# Patient Record
Sex: Male | Born: 1960 | Race: Black or African American | Hispanic: No | Smoking: Current every day smoker
Health system: Southern US, Community
[De-identification: ages and names within clinical notes are randomized; demographics above are authoritative.]

## PROBLEM LIST (undated history)

## (undated) DIAGNOSIS — E785 Hyperlipidemia, unspecified: Secondary | ICD-10-CM

## (undated) DIAGNOSIS — I2510 Atherosclerotic heart disease of native coronary artery without angina pectoris: Secondary | ICD-10-CM

## (undated) DIAGNOSIS — K219 Gastro-esophageal reflux disease without esophagitis: Secondary | ICD-10-CM

## (undated) DIAGNOSIS — I219 Acute myocardial infarction, unspecified: Secondary | ICD-10-CM

## (undated) DIAGNOSIS — K089 Disorder of teeth and supporting structures, unspecified: Secondary | ICD-10-CM

## (undated) DIAGNOSIS — J189 Pneumonia, unspecified organism: Secondary | ICD-10-CM

## (undated) DIAGNOSIS — F1110 Opioid abuse, uncomplicated: Secondary | ICD-10-CM

## (undated) DIAGNOSIS — M1990 Unspecified osteoarthritis, unspecified site: Secondary | ICD-10-CM

## (undated) DIAGNOSIS — N189 Chronic kidney disease, unspecified: Secondary | ICD-10-CM

## (undated) DIAGNOSIS — J449 Chronic obstructive pulmonary disease, unspecified: Secondary | ICD-10-CM

## (undated) DIAGNOSIS — I10 Essential (primary) hypertension: Secondary | ICD-10-CM

## (undated) DIAGNOSIS — F1010 Alcohol abuse, uncomplicated: Secondary | ICD-10-CM

## (undated) DIAGNOSIS — F1910 Other psychoactive substance abuse, uncomplicated: Secondary | ICD-10-CM

## (undated) DIAGNOSIS — E1142 Type 2 diabetes mellitus with diabetic polyneuropathy: Secondary | ICD-10-CM

## (undated) HISTORY — DX: Gastro-esophageal reflux disease without esophagitis: K21.9

## (undated) HISTORY — DX: Opioid abuse, uncomplicated: F11.10

## (undated) HISTORY — DX: Chronic obstructive pulmonary disease, unspecified: J44.9

## (undated) HISTORY — DX: Hyperlipidemia, unspecified: E78.5

## (undated) HISTORY — DX: Other psychoactive substance abuse, uncomplicated: F19.10

## (undated) HISTORY — DX: Essential (primary) hypertension: I10

## (undated) HISTORY — PX: TOTAL KNEE ARTHROPLASTY: SHX125

## (undated) HISTORY — DX: Unspecified osteoarthritis, unspecified site: M19.90

## (undated) HISTORY — DX: Alcohol abuse, uncomplicated: F10.10

## (undated) HISTORY — DX: Chronic kidney disease, unspecified: N18.9

## (undated) HISTORY — DX: Type 2 diabetes mellitus with diabetic polyneuropathy: E11.42

---

## 2007-07-07 HISTORY — PX: HERNIA REPAIR: SHX51

## 2010-06-16 ENCOUNTER — Ambulatory Visit: Payer: Self-pay | Admitting: Cardiovascular Disease

## 2010-06-23 ENCOUNTER — Telehealth: Payer: Self-pay | Admitting: Cardiovascular Disease

## 2010-08-07 NOTE — Progress Notes (Signed)
Summary: stress test results  Phone Note Outgoing Call   Call placed by: Dessie Coma  LPN,  June 23, 2010 8:54 AM Call placed to: Patient Summary of Call: Patient notified per Dr. Kirke Corin, stress test showed no evidence of Ischemia-can proceed with surgery at low-moderate risk.  Copy of stress test faxed to Dr. Marina Goodell and Dr. Tally Joe.

## 2010-11-18 NOTE — Letter (Signed)
June 16, 2010    Dr. Brent Bulla  [address]  [place], [place]  [address]   RE:  Brent Ortiz, Brent Ortiz  MRN:  [id number]  /  DOB:  1960-09-21   Dear Dr. Marina Goodell:   Thank you for referring Brent Ortiz for further cardiac evaluation.  As  you are aware, this is a 50 year old gentleman with the following  problem list:  1. Hypertension.  2. Hyperlipidemia.  3. Tobacco use.  4. Gout.  5. Osteoarthritis.   CLINICAL HISTORY:  Brent Ortiz is here today for preoperative  cardiovascular evaluation for a planned knee replacement.  The patient  has no previous cardiac history.  Throughout his life, he had  intermittent chest pain.  About 15 years ago, he underwent cardiac  catheterization which according to him did not show significant coronary  artery disease.  He has not had any recent cardiac workup.  Over the  last few years, the patient has gained significant weight and due to his  arthritis, especially in his knees, he has been very inactive.  The  patient is not able to engage in any kind of meaningful physical  exercise due to that.  When he tries to walk with a cane, he gets  dyspnea but there has been no chest pain.  He denies any dizziness,  syncope, or presyncope.   MEDICATIONS:  The patient has not been taking any medications for the  last 8 months.  He was supposed to be on the following which will be  resumed when he starts taking them again after he picks the medications  from the pharmacy.  1. Toprol 100 mg once daily.  2. Allopurinol 300 mg once daily.  3. Omeprazole 20 mg once daily.  4. Lipitor 20 mg at bedtime.   ALLERGIES:  Include MOTRIN.   SOCIAL HISTORY:  The patient is married and has 5 children.  He smokes 1  pack per day for the last 37 years.  He denies any alcohol or  recreational drug use.  He does not exercise at this time.  He works as  a Lawyer.   FAMILY HISTORY:  Negative for coronary artery disease.  His mother died  of colon cancer.   PAST SURGICAL  HISTORY:  Includes:  1. Hernia repair in 2010.  2. Knee surgery many years ago.   REVIEW OF SYSTEMS:  This is remarkable for knee pain, chronic exertional  dyspnea, heartburn.  A full review of system was performed and is  otherwise negative.   PHYSICAL EXAMINATION:  GENERAL:  The patient is obese, is in no acute  distress.  VITAL SIGNS:  Weight is 298.2 pounds, blood pressure is 142/89, pulse is  100, oxygen saturation is 98% on room air.  HEENT:  Normocephalic, atraumatic.  NECK:  No JVD or carotid bruits.  RESPIRATORY:  Normal respiratory effort with no use of accessory  muscles.  Auscultation reveals normal breath sounds.  CARDIOVASCULAR:  Normal PMI.  Normal S1 and S2 with no gallops or  murmurs.  ABDOMEN:  Benign, nontender, and nondistended.  EXTREMITIES:  With no clubbing, cyanosis, or edema.  SKIN:  Warm and dry with no rash.  PSYCHIATRIC:  He is alert, oriented x3, with normal mood and affect.   An electrocardiogram was performed which showed normal sinus rhythm with  a heart rate of 99 beats per minute.  There are no significant ST or T-  wave changes.   IMPRESSION:  1. Preoperative cardiovascular evaluation for knee  replacement:  The      patient has no previous cardiac history and no history of diabetes.      However, he has risk factors which include hypertension,      hyperlipidemia, tobacco use, and sedentary lifestyle.  His      functional capacity is poor and has been limited due to severe knee      arthritis.  He does have dyspnea with minimal activities which      could be due to conditioning but also we will have to rule out      underlying coronary artery disease.  Due to his multiple risk      factors, as well as poor functional capacity, I recommend stress      testing before proceeding with the surgery.  He is not able to      exercise on the treadmill and thus we will obtain a Lexiscan      nuclear stress test.  If the stress test is normal, he can  proceed      with the surgery at low cardiovascular risk.  I advised him to      resume Toprol 100 mg once daily which will help with his blood      pressure and also his relatively high heart rate.  2. Hyperlipidemia:  He will resume Lipitor 20 mg once daily.  3. Tobacco use:  I counseled him regarding the importance of smoking      cessation.  I also discussed with him the importance of regular      exercise, especially after he gets his knees replaced.   Thank you for allowing me to participate in the care of your patient.    Sincerely,      Lorine Bears, MD  Electronically Signed    MA/MedQ  DD: 06/16/2010  DT: 06/16/2010  Job #: [id number]

## 2011-03-03 ENCOUNTER — Encounter: Payer: Self-pay | Admitting: Cardiovascular Disease

## 2011-03-19 ENCOUNTER — Encounter: Payer: Self-pay | Admitting: Cardiovascular Disease

## 2011-06-15 ENCOUNTER — Encounter (HOSPITAL_COMMUNITY): Payer: Self-pay | Admitting: Pharmacy Technician

## 2011-06-17 ENCOUNTER — Encounter (HOSPITAL_COMMUNITY)
Admission: RE | Admit: 2011-06-17 | Discharge: 2011-06-17 | Disposition: A | Discharge status: Home / Self Care | Payer: Medicaid Other | Source: Ambulatory Visit | Attending: Orthopedic Surgery | Admitting: Orthopedic Surgery

## 2011-06-17 ENCOUNTER — Encounter (HOSPITAL_COMMUNITY): Payer: Self-pay

## 2011-06-17 ENCOUNTER — Other Ambulatory Visit: Payer: Self-pay

## 2011-06-17 HISTORY — DX: Atherosclerotic heart disease of native coronary artery without angina pectoris: I25.10

## 2011-06-17 HISTORY — DX: Acute myocardial infarction, unspecified: I21.9

## 2011-06-17 LAB — URINALYSIS, ROUTINE W REFLEX MICROSCOPIC
Glucose, UA: 100 mg/dL — AB
Leukocytes, UA: NEGATIVE
Nitrite: NEGATIVE
Protein, ur: NEGATIVE mg/dL
pH: 5.5 (ref 5.0–8.0)

## 2011-06-17 LAB — CBC
HCT: 39.7 % (ref 39.0–52.0)
Hemoglobin: 13.5 g/dL (ref 13.0–17.0)
MCH: 29.2 pg (ref 26.0–34.0)
MCV: 85.9 fL (ref 78.0–100.0)
RBC: 4.62 MIL/uL (ref 4.22–5.81)

## 2011-06-17 LAB — COMPREHENSIVE METABOLIC PANEL
Albumin: 4 g/dL (ref 3.5–5.2)
Alkaline Phosphatase: 66 U/L (ref 39–117)
BUN: 10 mg/dL (ref 6–23)
Calcium: 9.7 mg/dL (ref 8.4–10.5)
Creatinine, Ser: 0.85 mg/dL (ref 0.50–1.35)
GFR calc Af Amer: >90 mL/min (ref >90)
Glucose, Bld: 159 mg/dL — ABNORMAL HIGH (ref 70–99)
Potassium: 4 mEq/L (ref 3.5–5.1)
Total Protein: 7.4 g/dL (ref 6.0–8.3)

## 2011-06-17 LAB — DIFFERENTIAL
Eosinophils Absolute: 0.2 10*3/uL (ref 0.0–0.7)
Eosinophils Relative: 2 % (ref 0–5)
Lymphs Abs: 3.1 10*3/uL (ref 0.7–4.0)
Monocytes Relative: 5 % (ref 3–12)

## 2011-06-17 NOTE — Pre-Procedure Instructions (Addendum)
Cardiac clearance note dr Woodfin Ganja 05-29-2011 on chart Medical clearance note dr Marina Goodell 06-08-2011 on chart Chest 1 view xray  03-21-2011 [facility] on chart Echo 03-23-2011 [facility] on chart 03-21-2011 cardiac cath report on chart

## 2011-06-17 NOTE — Progress Notes (Signed)
H&P performed on 06/17/11. Dictation # [id number] 

## 2011-06-17 NOTE — Patient Instructions (Signed)
20 Brent Ortiz  06/17/2011   Your procedure is scheduled on:06-23-2011  Report to [facility] at 1000 AM.  Call this number if you have problems the morning of surgery: [phone]   Remember:   Do not eat food:After Midnight.  May have clear liquids:until Midnight .  Clear liquids include soda, tea, black coffee, apple or grape juice, broth.  Take these medicines the morning of surgery with A SIP OF WATER: norvasc, metoprolol, protonix   Do not wear jewelry  Do not wear lotions, powders.  Do not shave 48 hours prior to surgery.  Do not bring valuables to the hospital.  Contacts, dentures or bridgework may not be worn into surgery.  Leave suitcase in the car. After surgery it may be brought to your room.  For patients admitted to the hospital, checkout time is 11:00 AM the day of discharge.   Patients discharged the day of surgery will not be allowed to drive home.   Special Instructions: CHG Shower Use Special Wash: 1/2 bottle night before surgery and 1/2 bottle morning of surgery.neck down avoid private area   Please read over the following fact sheets that you were given: MRSA Information, blood fact sheet, incentive spirometer fact sheet  [name], rn wl pre op phone number [phone]

## 2011-06-17 NOTE — H&P (Signed)
NAME:  Brent Ortiz, Brent Ortiz NO.:  [id number]  MEDICAL RECORD NO.:  [id number]  LOCATION:  PERIO                        FACILITY:  [facility]  PHYSICIAN:  Madlyn Frankel. Charlann Boxer, M.D.  DATE OF BIRTH:  September 16, 1960  DATE OF ADMISSION:  06/23/2011 DATE OF DISCHARGE:                             HISTORY & PHYSICAL   DATE OF SURGERY:  June 23, 2011.  ADMITTING DIAGNOSIS:  Failed total knee implant, left knee.  HISTORY OF PRESENT ILLNESS:  This is a 50 year old gentleman with a history of total knee arthroplasty of his right knee in February 2012 and left knee in March 2012 with mechanical loosening of the left knee. Due to continued pain, discomfort, and problems, he is now scheduled for total knee arthroplasty revision of his left knee.  The surgery, risks, benefits, and aftercare were discussed in detail with the patient. Questions invited and answered.  His medical doctor is Dr. __________ Marina Goodell.  His cardiologist is Dr. Olena Heckle.  He plans on going home after surgery.  He is not a candidate for trans-Cinnamic acid and will not receive that at surgery and he is not given his home medicines today.  Note that he had a recent MI approximately 3 months ago, did not have a stent, has been on Effient.  For his MI, he had Cardiology clearance to come off that a week prior to surgery.  PAST MEDICAL HISTORY:  Drug allergies to Motrin.  CURRENT MEDICATIONS: 1. Lisinopril 10 mg 1 p.o. daily. 2. Januvia 100 mg 1 p.o. daily. 3. Metoprolol ER 200 mg 1 daily. 4. Aspirin 81 mg 1 daily. 5. Amlodipine 2.5 mg 1 daily. 6. Nitrostat 0.4 mg sublingual p.r.n., but he has not used that since     his heart attack. 7. Cyclobenzaprine 10 mg 1 t.i.d. p.r.n. spasm. 8. Pantoprazole 40 mg 1 daily. 9. Crestor 20 mg 1 daily. 10.Effient 10 mg 1 daily. 11.Percocet 10/325 one q.6 h. p.r.n.  SERIOUS MEDICAL ILLNESSES:  Include hypertension, diabetes, coronary artery disease with history of MI,  hyperlipidemia, reflux.  PREVIOUS SURGERIES:  Appendectomy, left and right knee total knee arthroplasty, and herniorrhaphy.  FAMILY HISTORY:  Positive for cancer, diabetes, heart disease, and hypertension.  SOCIAL HISTORY:  The patient is married.  He lives at home.  He does not smoke, does not drink, and he is planning on going home after surgery.  REVIEW OF SYSTEMS:  CENTRAL NERVOUS SYSTEM:  Positive for occasional blurred vision, balance problems, and tinnitus.  PULMONARY:  Positive for exertional shortness of breath.  Negative for PND and orthopnea. CARDIOVASCULAR:  Negative for chest pain since his history of MI about 3 months ago.  GI:  Positive for history of ulcers in the distant past. Negative for hepatitis.  Positive for reflux.  GU:  Negative for urinary tract difficulties.  MUSCULOSKELETAL:  Positive in HPI.  PHYSICAL EXAMINATION:  VITAL SIGNS:  BP 134/78, respirations 16, pulse 68 and regular. GENERAL APPEARANCE:  This is an obese gentleman, in no acute distress. HEENT:  Head normocephalic.  Nose patent.  Ears patent.  Pupils equal, round, and reactive to light.  Throat without injection. NECK:  Supple without adenopathy.  Carotids 2+ without  bruits. CHEST:  Clear to auscultation.  No rales or rhonchi.  Respirations 16. HEART:  Pulse 68 beats per minute without murmur. ABDOMEN:  Soft with active bowel sounds.  No masses or organomegaly. NEUROLOGIC:  The patient is alert and oriented to time, place, and person.  Cranial nerves II through XII grossly intact. EXTREMITIES:  Shows the right knee with full extension and further flexion to 115 degrees.  The left knee has full extension with further flexion to about 80 degrees with pain.  Sensation and circulation are intact.  IMPRESSION:  Failed total knee arthroplasty, left knee.  PLAN:  Revision total knee arthroplasty, both components of left knee.     Jaquelyn Bitter. Tysheem Accardo,  P.A.   ______________________________ Madlyn Frankel Charlann Boxer, M.D.    SJC/MEDQ  D:  06/17/2011  T:  06/17/2011  Job:  [id number]

## 2011-06-22 NOTE — Pre-Procedure Instructions (Addendum)
Cardiac clearance note dr Elenor Quinones on chart Medical clearance note dr Marina Goodell on chart

## 2011-06-23 ENCOUNTER — Encounter (HOSPITAL_COMMUNITY)
Admission: RE | Disposition: A | Discharge status: Home Health | Payer: Self-pay | Source: Ambulatory Visit | Attending: Orthopedic Surgery

## 2011-06-23 ENCOUNTER — Inpatient Hospital Stay (HOSPITAL_COMMUNITY): Payer: Medicaid Other | Admitting: Anesthesiology

## 2011-06-23 ENCOUNTER — Encounter (HOSPITAL_COMMUNITY): Payer: Self-pay | Admitting: *Deleted

## 2011-06-23 ENCOUNTER — Inpatient Hospital Stay (HOSPITAL_COMMUNITY)
Admission: RE | Admit: 2011-06-23 | Discharge: 2011-06-26 | DRG: 468 | Disposition: A | Discharge status: Home Health | Payer: Medicaid Other | Source: Ambulatory Visit | Attending: Orthopedic Surgery | Admitting: Orthopedic Surgery

## 2011-06-23 ENCOUNTER — Encounter (HOSPITAL_COMMUNITY): Payer: Self-pay | Admitting: Anesthesiology

## 2011-06-23 DIAGNOSIS — Z96659 Presence of unspecified artificial knee joint: Secondary | ICD-10-CM

## 2011-06-23 DIAGNOSIS — Y831 Surgical operation with implant of artificial internal device as the cause of abnormal reaction of the patient, or of later complication, without mention of misadventure at the time of the procedure: Secondary | ICD-10-CM | POA: Diagnosis present

## 2011-06-23 DIAGNOSIS — K219 Gastro-esophageal reflux disease without esophagitis: Secondary | ICD-10-CM | POA: Diagnosis present

## 2011-06-23 DIAGNOSIS — E785 Hyperlipidemia, unspecified: Secondary | ICD-10-CM | POA: Diagnosis present

## 2011-06-23 DIAGNOSIS — E119 Type 2 diabetes mellitus without complications: Secondary | ICD-10-CM | POA: Diagnosis present

## 2011-06-23 DIAGNOSIS — M1710 Unilateral primary osteoarthritis, unspecified knee: Secondary | ICD-10-CM | POA: Diagnosis present

## 2011-06-23 DIAGNOSIS — T84099A Other mechanical complication of unspecified internal joint prosthesis, initial encounter: Principal | ICD-10-CM | POA: Diagnosis present

## 2011-06-23 DIAGNOSIS — I2510 Atherosclerotic heart disease of native coronary artery without angina pectoris: Secondary | ICD-10-CM | POA: Diagnosis present

## 2011-06-23 DIAGNOSIS — Z01812 Encounter for preprocedural laboratory examination: Secondary | ICD-10-CM

## 2011-06-23 DIAGNOSIS — I252 Old myocardial infarction: Secondary | ICD-10-CM

## 2011-06-23 HISTORY — PX: TOTAL KNEE REVISION: SHX996

## 2011-06-23 LAB — GLUCOSE, CAPILLARY
Glucose-Capillary: 167 mg/dL — ABNORMAL HIGH (ref 70–99)
Glucose-Capillary: 143 mg/dL — ABNORMAL HIGH (ref 70–99)

## 2011-06-23 LAB — TYPE AND SCREEN: ABO/RH(D): A POS

## 2011-06-23 SURGERY — TOTAL KNEE REVISION
Anesthesia: General | Site: Knee | Laterality: Left | Wound class: Clean

## 2011-06-23 MED ORDER — ROSUVASTATIN CALCIUM 20 MG PO TABS
20.0000 mg | ORAL_TABLET | Freq: Every day | ORAL | Status: DC
  Administered 2011-06-23 – 2011-06-25 (×3): 20 mg via ORAL
  Filled 2011-06-23 (×4): qty 1

## 2011-06-23 MED ORDER — PHENYLEPHRINE HCL 10 MG/ML IJ SOLN
INTRAMUSCULAR | Status: DC | PRN
  Administered 2011-06-23: 20 ug via INTRAVENOUS
  Administered 2011-06-23: 30 ug via INTRAVENOUS
  Administered 2011-06-23: 20 ug via INTRAVENOUS
  Administered 2011-06-23: 40 ug via INTRAVENOUS
  Administered 2011-06-23: 10 ug via INTRAVENOUS
  Administered 2011-06-23 (×2): 20 ug via INTRAVENOUS

## 2011-06-23 MED ORDER — ACETAMINOPHEN 325 MG PO TABS: 650.0000 mg | ORAL_TABLET | Freq: Four times a day (QID) | ORAL | Status: DC | PRN

## 2011-06-23 MED ORDER — ZOLPIDEM TARTRATE 5 MG PO TABS: 5.0000 mg | ORAL_TABLET | Freq: Every evening | ORAL | Status: DC | PRN

## 2011-06-23 MED ORDER — BISACODYL 5 MG PO TBEC: 5.0000 mg | DELAYED_RELEASE_TABLET | Freq: Every day | ORAL | Status: DC | PRN

## 2011-06-23 MED ORDER — CEFAZOLIN SODIUM-DEXTROSE 2-3 GM-% IV SOLR
2.0000 g | Freq: Once | INTRAVENOUS | Status: AC
  Administered 2011-06-23: 2 g via INTRAVENOUS

## 2011-06-23 MED ORDER — ONDANSETRON HCL 4 MG/2ML IJ SOLN
INTRAMUSCULAR | Status: DC | PRN
  Administered 2011-06-23 (×2): 2 mg via INTRAVENOUS

## 2011-06-23 MED ORDER — ACETAMINOPHEN 650 MG RE SUPP: 650.0000 mg | Freq: Four times a day (QID) | RECTAL | Status: DC | PRN

## 2011-06-23 MED ORDER — PROMETHAZINE HCL 25 MG/ML IJ SOLN: 6.2500 mg | INTRAMUSCULAR | Status: DC | PRN

## 2011-06-23 MED ORDER — LIDOCAINE HCL (CARDIAC) 20 MG/ML IV SOLN
INTRAVENOUS | Status: DC | PRN
  Administered 2011-06-23: 30 mg via INTRAVENOUS

## 2011-06-23 MED ORDER — METHOCARBAMOL 100 MG/ML IJ SOLN
500.0000 mg | Freq: Four times a day (QID) | INTRAVENOUS | Status: DC | PRN
  Administered 2011-06-24: 500 mg via INTRAVENOUS
  Filled 2011-06-23 (×2): qty 5

## 2011-06-23 MED ORDER — NEOSTIGMINE METHYLSULFATE 1 MG/ML IJ SOLN
INTRAMUSCULAR | Status: DC | PRN
  Administered 2011-06-23: 5 mg via INTRAVENOUS

## 2011-06-23 MED ORDER — METOCLOPRAMIDE HCL 5 MG/ML IJ SOLN: 5.0000 mg | Freq: Three times a day (TID) | INTRAMUSCULAR | Status: DC | PRN

## 2011-06-23 MED ORDER — GLYCOPYRROLATE 0.2 MG/ML IJ SOLN
INTRAMUSCULAR | Status: DC | PRN
  Administered 2011-06-23: .6 mg via INTRAVENOUS

## 2011-06-23 MED ORDER — METHOCARBAMOL 500 MG PO TABS
500.0000 mg | ORAL_TABLET | Freq: Four times a day (QID) | ORAL | Status: DC | PRN
  Administered 2011-06-23 – 2011-06-26 (×6): 500 mg via ORAL
  Filled 2011-06-23 (×6): qty 1

## 2011-06-23 MED ORDER — BUPIVACAINE-EPINEPHRINE PF 0.25-1:200000 % IJ SOLN
INTRAMUSCULAR | Status: DC | PRN
  Administered 2011-06-23: 50 mL

## 2011-06-23 MED ORDER — SODIUM CHLORIDE 0.9 % IR SOLN
Status: DC | PRN
  Administered 2011-06-23: 1000 mL

## 2011-06-23 MED ORDER — SODIUM CHLORIDE 0.9 % IV BOLUS (SEPSIS)
500.0000 mL | Freq: Once | INTRAVENOUS | Status: AC
  Administered 2011-06-23: 500 mL via INTRAVENOUS

## 2011-06-23 MED ORDER — LISINOPRIL 10 MG PO TABS
10.0000 mg | ORAL_TABLET | Freq: Every day | ORAL | Status: DC
  Filled 2011-06-23 (×4): qty 1

## 2011-06-23 MED ORDER — MENTHOL 3 MG MT LOZG: 1.0000 | LOZENGE | OROMUCOSAL | Status: DC | PRN

## 2011-06-23 MED ORDER — ONDANSETRON HCL 4 MG PO TABS: 4.0000 mg | ORAL_TABLET | Freq: Four times a day (QID) | ORAL | Status: DC | PRN

## 2011-06-23 MED ORDER — INSULIN ASPART 100 UNIT/ML ~~LOC~~ SOLN
0.0000 [IU] | Freq: Three times a day (TID) | SUBCUTANEOUS | Status: DC
  Administered 2011-06-24 – 2011-06-26 (×5): 2 [IU] via SUBCUTANEOUS
  Filled 2011-06-23: qty 3

## 2011-06-23 MED ORDER — ONDANSETRON HCL 4 MG/2ML IJ SOLN: 4.0000 mg | Freq: Four times a day (QID) | INTRAMUSCULAR | Status: DC | PRN

## 2011-06-23 MED ORDER — HYDROMORPHONE HCL PF 1 MG/ML IJ SOLN
0.5000 mg | INTRAMUSCULAR | Status: DC | PRN
  Administered 2011-06-23: 1 mg via INTRAVENOUS
  Administered 2011-06-23 – 2011-06-24 (×4): 2 mg via INTRAVENOUS
  Administered 2011-06-24: 1 mg via INTRAVENOUS
  Filled 2011-06-23 (×4): qty 2
  Filled 2011-06-23 (×2): qty 1

## 2011-06-23 MED ORDER — METOCLOPRAMIDE HCL 10 MG PO TABS: 5.0000 mg | ORAL_TABLET | Freq: Three times a day (TID) | ORAL | Status: DC | PRN

## 2011-06-23 MED ORDER — LACTATED RINGERS IV SOLN
INTRAVENOUS | Status: DC | PRN
  Administered 2011-06-23 (×2): via INTRAVENOUS

## 2011-06-23 MED ORDER — RIVAROXABAN 10 MG PO TABS
10.0000 mg | ORAL_TABLET | ORAL | Status: DC
  Administered 2011-06-24 – 2011-06-25 (×2): 10 mg via ORAL
  Filled 2011-06-23 (×3): qty 1

## 2011-06-23 MED ORDER — MIDAZOLAM HCL 5 MG/5ML IJ SOLN
INTRAMUSCULAR | Status: DC | PRN
  Administered 2011-06-23: 2 mg via INTRAVENOUS

## 2011-06-23 MED ORDER — LINAGLIPTIN 5 MG PO TABS
5.0000 mg | ORAL_TABLET | Freq: Every day | ORAL | Status: DC
  Administered 2011-06-23 – 2011-06-25 (×3): 5 mg via ORAL
  Filled 2011-06-23 (×4): qty 1

## 2011-06-23 MED ORDER — SUCCINYLCHOLINE CHLORIDE 20 MG/ML IJ SOLN
INTRAMUSCULAR | Status: DC | PRN
  Administered 2011-06-23: 150 mg via INTRAVENOUS

## 2011-06-23 MED ORDER — FENTANYL CITRATE 0.05 MG/ML IJ SOLN
INTRAMUSCULAR | Status: DC | PRN
  Administered 2011-06-23 (×3): 50 ug via INTRAVENOUS

## 2011-06-23 MED ORDER — CEFAZOLIN SODIUM-DEXTROSE 2-3 GM-% IV SOLR
2.0000 g | Freq: Four times a day (QID) | INTRAVENOUS | Status: AC
  Administered 2011-06-23 – 2011-06-24 (×3): 2 g via INTRAVENOUS
  Filled 2011-06-23 (×3): qty 50

## 2011-06-23 MED ORDER — HETASTARCH-ELECTROLYTES 6 % IV SOLN
INTRAVENOUS | Status: DC | PRN
  Administered 2011-06-23: 16:00:00 via INTRAVENOUS

## 2011-06-23 MED ORDER — SODIUM CHLORIDE 0.9 % IV SOLN
INTRAVENOUS | Status: DC
  Administered 2011-06-23: 21:00:00 via INTRAVENOUS
  Filled 2011-06-23 (×10): qty 1000

## 2011-06-23 MED ORDER — HYDROMORPHONE HCL PF 1 MG/ML IJ SOLN
0.2500 mg | INTRAMUSCULAR | Status: DC | PRN
  Administered 2011-06-23: 0.5 mg via INTRAVENOUS

## 2011-06-23 MED ORDER — PHENOL 1.4 % MT LIQD: 1.0000 | OROMUCOSAL | Status: DC | PRN

## 2011-06-23 MED ORDER — CYCLOBENZAPRINE HCL 10 MG PO TABS
10.0000 mg | ORAL_TABLET | Freq: Every day | ORAL | Status: DC
  Administered 2011-06-23 – 2011-06-25 (×3): 10 mg via ORAL
  Filled 2011-06-23 (×5): qty 1

## 2011-06-23 MED ORDER — PROPOFOL 10 MG/ML IV EMUL
INTRAVENOUS | Status: DC | PRN
  Administered 2011-06-23: 200 mg via INTRAVENOUS

## 2011-06-23 MED ORDER — ACETAMINOPHEN 10 MG/ML IV SOLN
INTRAVENOUS | Status: DC | PRN
  Administered 2011-06-23: 1000 mg via INTRAVENOUS

## 2011-06-23 MED ORDER — AMLODIPINE BESYLATE 2.5 MG PO TABS
2.5000 mg | ORAL_TABLET | Freq: Every day | ORAL | Status: DC
  Filled 2011-06-23 (×4): qty 1

## 2011-06-23 MED ORDER — HYDROMORPHONE HCL PF 1 MG/ML IJ SOLN
INTRAMUSCULAR | Status: DC | PRN
  Administered 2011-06-23 (×4): 0.5 mg via INTRAVENOUS

## 2011-06-23 MED ORDER — SODIUM CHLORIDE 0.9 % IV SOLN
INTRAVENOUS | Status: DC
  Administered 2011-06-23: 18:00:00 via INTRAVENOUS

## 2011-06-23 MED ORDER — POLYETHYLENE GLYCOL 3350 17 G PO PACK
17.0000 g | PACK | Freq: Two times a day (BID) | ORAL | Status: DC
  Administered 2011-06-23 – 2011-06-26 (×6): 17 g via ORAL
  Filled 2011-06-23 (×7): qty 1

## 2011-06-23 MED ORDER — FLEET ENEMA 7-19 GM/118ML RE ENEM: 1.0000 | ENEMA | Freq: Once | RECTAL | Status: AC | PRN

## 2011-06-23 MED ORDER — ALUM & MAG HYDROXIDE-SIMETH 200-200-20 MG/5ML PO SUSP: 30.0000 mL | ORAL | Status: DC | PRN

## 2011-06-23 MED ORDER — DIPHENHYDRAMINE HCL 25 MG PO CAPS: 25.0000 mg | ORAL_CAPSULE | Freq: Four times a day (QID) | ORAL | Status: DC | PRN

## 2011-06-23 MED ORDER — ROPIVACAINE HCL 5 MG/ML IJ SOLN
INTRAMUSCULAR | Status: DC | PRN
  Administered 2011-06-23: 30 mL

## 2011-06-23 MED ORDER — LACTATED RINGERS IV SOLN
INTRAVENOUS | Status: DC
  Administered 2011-06-23: 1000 mL via INTRAVENOUS

## 2011-06-23 MED ORDER — PANTOPRAZOLE SODIUM 40 MG PO TBEC
40.0000 mg | DELAYED_RELEASE_TABLET | Freq: Every day | ORAL | Status: DC
  Administered 2011-06-23 – 2011-06-26 (×4): 40 mg via ORAL
  Filled 2011-06-23 (×4): qty 1

## 2011-06-23 MED ORDER — OXYCODONE HCL 5 MG PO TABS
5.0000 mg | ORAL_TABLET | ORAL | Status: DC
  Administered 2011-06-23 – 2011-06-24 (×6): 15 mg via ORAL
  Filled 2011-06-23 (×6): qty 3

## 2011-06-23 MED ORDER — LACTATED RINGERS IV SOLN: INTRAVENOUS | Status: DC

## 2011-06-23 MED ORDER — DOCUSATE SODIUM 100 MG PO CAPS
100.0000 mg | ORAL_CAPSULE | Freq: Two times a day (BID) | ORAL | Status: DC
  Administered 2011-06-23 – 2011-06-26 (×6): 100 mg via ORAL
  Filled 2011-06-23 (×7): qty 1

## 2011-06-23 MED ORDER — METOPROLOL SUCCINATE ER 100 MG PO TB24
200.0000 mg | ORAL_TABLET | Freq: Every day | ORAL | Status: DC
  Administered 2011-06-26: 200 mg via ORAL
  Filled 2011-06-23 (×4): qty 2

## 2011-06-23 MED ORDER — CISATRACURIUM BESYLATE 2 MG/ML IV SOLN
INTRAVENOUS | Status: DC | PRN
  Administered 2011-06-23: 10 mg via INTRAVENOUS

## 2011-06-23 MED ORDER — NITROGLYCERIN 0.4 MG SL SUBL: 0.4000 mg | SUBLINGUAL_TABLET | SUBLINGUAL | Status: DC | PRN

## 2011-06-23 MED ORDER — FERROUS SULFATE 325 (65 FE) MG PO TABS
325.0000 mg | ORAL_TABLET | Freq: Three times a day (TID) | ORAL | Status: DC
  Administered 2011-06-23 – 2011-06-26 (×8): 325 mg via ORAL
  Filled 2011-06-23 (×10): qty 1

## 2011-06-23 SURGICAL SUPPLY — 80 items
ADAPTER BOLT FEMORAL +2/-2 (Knees) ×1 IMPLANT
ADH SKN CLS APL DERMABOND .7 (GAUZE/BANDAGES/DRESSINGS) ×1
ADPR FEM +2/-2 OFST BOLT (Knees) ×1 IMPLANT
ADPR FEM 5D STRL KN PFC SGM (Orthopedic Implant) ×1 IMPLANT
AUG FEM SZ4 8 STRL LF KN LT TI (Knees) ×2 IMPLANT
AUG TIB SZ3 5 REV STP WDG STRL (Knees) ×1 IMPLANT
AUG TIB SZ4 5 REV STP WDG STRL (Knees) ×1 IMPLANT
AUGMENT POSTERIOR PFC (Knees) IMPLANT
BAG SPEC THK2 15X12 ZIP CLS (MISCELLANEOUS) ×1
BAG ZIPLOCK 12X15 (MISCELLANEOUS) ×2 IMPLANT
BANDAGE ELASTIC 6 VELCRO ST LF (GAUZE/BANDAGES/DRESSINGS) ×2 IMPLANT
BANDAGE ESMARK 6X9 LF (GAUZE/BANDAGES/DRESSINGS) ×1 IMPLANT
BLADE SAW SGTL 13.0X1.19X90.0M (BLADE) ×2 IMPLANT
BLADE SAW SGTL 81X20 HD (BLADE) ×2 IMPLANT
BNDG CMPR 9X6 STRL LF SNTH (GAUZE/BANDAGES/DRESSINGS) ×1
BNDG ESMARK 6X9 LF (GAUZE/BANDAGES/DRESSINGS) ×2
BOWL SMART MIX CTS (DISPOSABLE) ×1 IMPLANT
CEMENT HV SMART SET (Cement) ×7 IMPLANT
CEMENT RESTRICTOR DEPUY SZ 4 (Cement) ×1 IMPLANT
CLOTH BEACON ORANGE TIMEOUT ST (SAFETY) ×2 IMPLANT
COMP FEM CEM LT SZ4 (Orthopedic Implant) ×2 IMPLANT
COMPONENT FEM CEM LT SZ4 (Orthopedic Implant) IMPLANT
CUFF TOURN SGL QUICK 34 (TOURNIQUET CUFF) ×2
CUFF TRNQT CYL 34X4X40X1 (TOURNIQUET CUFF) ×1 IMPLANT
DERMABOND ADVANCED (GAUZE/BANDAGES/DRESSINGS) ×1
DERMABOND ADVANCED .7 DNX12 (GAUZE/BANDAGES/DRESSINGS) IMPLANT
DRAPE EXTREMITY T 121X128X90 (DRAPE) ×2 IMPLANT
DRAPE POUCH INSTRU U-SHP 10X18 (DRAPES) ×2 IMPLANT
DRAPE U-SHAPE 47X51 STRL (DRAPES) ×2 IMPLANT
DRSG ADAPTIC 3X8 NADH LF (GAUZE/BANDAGES/DRESSINGS) ×2 IMPLANT
DRSG PAD ABDOMINAL 8X10 ST (GAUZE/BANDAGES/DRESSINGS) ×2 IMPLANT
DRSG TEGADERM 4X4.75 (GAUZE/BANDAGES/DRESSINGS) ×1 IMPLANT
DURAPREP 26ML APPLICATOR (WOUND CARE) ×2 IMPLANT
ELECT REM PT RETURN 9FT ADLT (ELECTROSURGICAL) ×2
ELECTRODE REM PT RTRN 9FT ADLT (ELECTROSURGICAL) ×1 IMPLANT
EVACUATOR 1/8 PVC DRAIN (DRAIN) ×2 IMPLANT
FACESHIELD LNG OPTICON STERILE (SAFETY) ×10 IMPLANT
FEMORAL ADAPTER (Orthopedic Implant) ×1 IMPLANT
GLOVE BIOGEL PI IND STRL 7.5 (GLOVE) ×1 IMPLANT
GLOVE BIOGEL PI IND STRL 8 (GLOVE) ×2 IMPLANT
GLOVE BIOGEL PI INDICATOR 7.5 (GLOVE) ×1
GLOVE BIOGEL PI INDICATOR 8 (GLOVE) ×2
GLOVE ORTHO TXT STRL SZ7.5 (GLOVE) ×4 IMPLANT
GLOVE SURG ORTHO 8.0 STRL STRW (GLOVE) ×2 IMPLANT
GOWN STRL NON-REIN LRG LVL3 (GOWN DISPOSABLE) ×2 IMPLANT
HANDPIECE INTERPULSE COAX TIP (DISPOSABLE) ×2
IMMOBILIZER KNEE 20 (SOFTGOODS)
IMMOBILIZER KNEE 20 THIGH 36 (SOFTGOODS) IMPLANT
KIT BASIN OR (CUSTOM PROCEDURE TRAY) ×2 IMPLANT
MANIFOLD NEPTUNE II (INSTRUMENTS) ×2 IMPLANT
NDL SAFETY ECLIPSE 18X1.5 (NEEDLE) ×1 IMPLANT
NEEDLE HYPO 18GX1.5 SHARP (NEEDLE) ×2
NS IRRIG 1000ML POUR BTL (IV SOLUTION) ×2 IMPLANT
PACK TOTAL JOINT (CUSTOM PROCEDURE TRAY) ×2 IMPLANT
PADDING CAST COTTON 6X4 STRL (CAST SUPPLIES) ×2 IMPLANT
PLATE ROT INSERT 12.5MM SIZE 4 (Plate) ×1 IMPLANT
POSITIONER SURGICAL ARM (MISCELLANEOUS) ×2 IMPLANT
POSTIER AUGMENT PFC (Knees) ×4 IMPLANT
RESTRICTOR CEMENT SZ 5 C-STEM (Cement) ×1 IMPLANT
SET HNDPC FAN SPRY TIP SCT (DISPOSABLE) ×1 IMPLANT
SET PAD KNEE POSITIONER (MISCELLANEOUS) ×2 IMPLANT
SPONGE GAUZE 2X2 8PLY STRL LF (GAUZE/BANDAGES/DRESSINGS) ×1 IMPLANT
SPONGE GAUZE 4X4 12PLY (GAUZE/BANDAGES/DRESSINGS) ×4 IMPLANT
SPONGE LAP 18X18 X RAY DECT (DISPOSABLE) ×1 IMPLANT
STAPLER VISISTAT 35W (STAPLE) IMPLANT
STEM TIBIA PFC 13X30MM (Stem) ×1 IMPLANT
SUCTION FRAZIER 12FR DISP (SUCTIONS) ×2 IMPLANT
SUT VIC AB 1 CT1 36 (SUTURE) ×6 IMPLANT
SUT VIC AB 2-0 CT1 27 (SUTURE) ×6
SUT VIC AB 2-0 CT1 TAPERPNT 27 (SUTURE) ×3 IMPLANT
SYR 50ML LL SCALE MARK (SYRINGE) ×2 IMPLANT
TOWEL OR 17X26 10 PK STRL BLUE (TOWEL DISPOSABLE) ×4 IMPLANT
TOWER CARTRIDGE SMART MIX (DISPOSABLE) ×2 IMPLANT
TRAY FOLEY CATH 14FRSI W/METER (CATHETERS) ×2 IMPLANT
TRAY REVISION SZ 4 (Knees) ×1 IMPLANT
TRAY SLEEVE CEM ML (Knees) ×1 IMPLANT
WATER STERILE IRR 1500ML POUR (IV SOLUTION) ×2 IMPLANT
WEDGE SIZE 3 5MM (Knees) ×1 IMPLANT
WEDGE SIZE 4 5MM (Knees) ×1 IMPLANT
WRAP KNEE MAXI GEL POST OP (GAUZE/BANDAGES/DRESSINGS) ×2 IMPLANT

## 2011-06-23 NOTE — H&P (View-Only) (Signed)
H&P performed on 06/17/11. Dictation # [id number]

## 2011-06-23 NOTE — Anesthesia Preprocedure Evaluation (Addendum)
Anesthesia Evaluation  Patient identified by MRN, date of birth, ID band Patient awake    Reviewed: Allergy & Precautions, H&P , NPO status , Patient's Chart, lab work & pertinent test results, reviewed documented beta blocker date and time   Airway Mallampati: II TM Distance: >3 FB Neck ROM: full    Dental No notable dental hx. (+) Missing and Dental Advisory Given,    Pulmonary neg pulmonary ROS,  clear to auscultation  Pulmonary exam normal       Cardiovascular Exercise Tolerance: Good hypertension, On Home Beta Blockers + CAD and + Past MI regular Normal Had a small MI 3 months ago.  CAD is treated medically.  Not good for intervention.  Has cardiac clearance and cardiologist describes his risk as low.   Neuro/Psych Negative Neurological ROS  Negative Psych ROS   GI/Hepatic negative GI ROS, Neg liver ROS,   Endo/Other  Diabetes mellitus-, Well Controlled, Type 2, Oral Hypoglycemic Agents  Renal/GU negative Renal ROS  Genitourinary negative   Musculoskeletal   Abdominal   Peds  Hematology negative hematology ROS (+)   Anesthesia Other Findings   Reproductive/Obstetrics negative OB ROS                         Anesthesia Physical Anesthesia Plan  ASA: III  Anesthesia Plan: General   Post-op Pain Management:    Induction:   Airway Management Planned: Oral ETT  Additional Equipment:   Intra-op Plan:   Post-operative Plan: Extubation in OR  Informed Consent: I have reviewed the patients History and Physical, chart, labs and discussed the procedure including the risks, benefits and alternatives for the proposed anesthesia with the patient or authorized representative who has indicated his/her understanding and acceptance.   Dental Advisory Given  Plan Discussed with: CRNA and Surgeon  Anesthesia Plan Comments:        Anesthesia Quick Evaluation

## 2011-06-23 NOTE — Plan of Care (Signed)
Problem: Consults Goal: Diagnosis- Total Joint Replacement Outcome: Completed/Met Date Met:  06/23/11 Revision left total knee

## 2011-06-23 NOTE — Anesthesia Procedure Notes (Signed)
Anesthesia Regional Block:  Femoral nerve block  Pre-Anesthetic Checklist: ,, timeout performed, Correct Patient, Correct Site, Correct Laterality, Correct Procedure, Correct Position, site marked, Risks and benefits discussed,  Surgical consent,  Pre-op evaluation,  At surgeon's request and post-op pain management  Laterality: Left  Prep: chloraprep       Needles:  Injection technique: Single-shot  Needle Type: Stimiplex     Needle Length:cm 4 cm Needle Gauge: 20 and 20 G    Additional Needles:  Procedures: ultrasound guided Femoral nerve block Narrative:  Start time: 06/23/2011 12:45 PM End time: 06/23/2011 12:55 PM Injection made incrementally with aspirations every 5 mL.  Performed by: Personally  Anesthesiologist: Gaetano Hawthorne MD  Additional Notes: Patient tolerated the procedure well without complications  Femoral nerve block

## 2011-06-23 NOTE — Anesthesia Postprocedure Evaluation (Signed)
  Anesthesia Post-op Note  Patient: Brent Ortiz  Procedure(s) Performed:  TOTAL KNEE REVISION - femomal nerve block done in holding area without incident  Patient Location: PACU  Anesthesia Type: General  Level of Consciousness: awake and alert   Airway and Oxygen Therapy: Patient Spontanous Breathing  Post-op Pain: mild  Post-op Assessment: Post-op Vital signs reviewed, Patient's Cardiovascular Status Stable, Respiratory Function Stable, Patent Airway and No signs of Nausea or vomiting  Post-op Vital Signs: stable  Complications: No apparent anesthesia complications

## 2011-06-23 NOTE — Interval H&P Note (Signed)
History and Physical Interval Note:  06/23/2011 7:44 AM  Brent Ortiz  has presented today for surgery, with the diagnosis of Failed Left Total Knee Replacement  The various methods of treatment have been discussed with the patient and family. After consideration of risks, benefits and other options for treatment, the patient has consented to  Procedure(s): LEFT TOTAL KNEE REVISION ARTHROPLASTY as a surgical intervention .  The patients' history has been reviewed, patient examined, no change in status, stable for surgery.  I have reviewed the patients' chart and labs.  Questions were answered to the patient's satisfaction.     Shelda Pal

## 2011-06-23 NOTE — Transfer of Care (Signed)
Immediate Anesthesia Transfer of Care Note  Patient: Brent Ortiz  Procedure(s) Performed:  TOTAL KNEE REVISION - femomal nerve block done in holding area without incident  Patient Location: PACU  Anesthesia Type: General and Regional  Level of Consciousness: awake  Airway & Oxygen Therapy: Patient Spontanous Breathing and Patient connected to face mask  Post-op Assessment: Report given to PACU RN and Post -op Vital signs reviewed and stable  Post vital signs: Reviewed and stable  Complications: No apparent anesthesia complications

## 2011-06-24 LAB — BASIC METABOLIC PANEL
BUN: 15 mg/dL (ref 6–23)
CO2: 29 mEq/L (ref 19–32)
GFR calc non Af Amer: 82 mL/min — ABNORMAL LOW (ref >90)
Glucose, Bld: 137 mg/dL — ABNORMAL HIGH (ref 70–99)
Potassium: 4.2 mEq/L (ref 3.5–5.1)

## 2011-06-24 LAB — GLUCOSE, CAPILLARY
Glucose-Capillary: 150 mg/dL — ABNORMAL HIGH (ref 70–99)
Glucose-Capillary: 97 mg/dL (ref 70–99)
Glucose-Capillary: 151 mg/dL — ABNORMAL HIGH (ref 70–99)

## 2011-06-24 LAB — CBC
HCT: 31.5 % — ABNORMAL LOW (ref 39.0–52.0)
Hemoglobin: 10.4 g/dL — ABNORMAL LOW (ref 13.0–17.0)
MCHC: 33 g/dL (ref 30.0–36.0)

## 2011-06-24 MED ORDER — KETOROLAC TROMETHAMINE 15 MG/ML IJ SOLN
15.0000 mg | Freq: Four times a day (QID) | INTRAMUSCULAR | Status: AC
Start: 2011-06-24 — End: 2011-06-26
  Administered 2011-06-24 – 2011-06-26 (×8): 15 mg via INTRAVENOUS
  Filled 2011-06-24 (×10): qty 1

## 2011-06-24 MED ORDER — OXYCODONE HCL 5 MG PO TABS
10.0000 mg | ORAL_TABLET | ORAL | Status: DC
  Administered 2011-06-24 (×2): 15 mg via ORAL
  Administered 2011-06-25 (×6): 20 mg via ORAL
  Administered 2011-06-26 (×2): 10 mg via ORAL
  Administered 2011-06-26: 20 mg via ORAL
  Filled 2011-06-24: qty 3
  Filled 2011-06-24 (×5): qty 4
  Filled 2011-06-24: qty 2
  Filled 2011-06-24: qty 4
  Filled 2011-06-24: qty 2
  Filled 2011-06-24: qty 4
  Filled 2011-06-24: qty 3

## 2011-06-24 NOTE — Progress Notes (Signed)
Physical Therapy Evaluation Patient Details Name: Brent Ortiz MRN: [id number] DOB: Oct 30, 1960 Today's Date: 06/24/2011 1025-1100 Ev2 Problem List:  Patient Active Problem List  Diagnoses  . S/P left knee revision    Past Medical History:  Past Medical History  Diagnosis Date  . HLD (hyperlipidemia)   . Gout   . Osteoarthritis   . HTN (hypertension)   . Coronary artery disease   . Myocardial infarction     04-13-2011  . Diabetes mellitus     niddm   Past Surgical History:  Past Surgical History  Procedure Date  . Total knee arthroplasty  right  feb 2012    left march 2012 r  . Hernia repair 2009    PT Assessment/Plan/Recommendation PT Assessment Clinical Impression Statement: pt s/p revision of LTKA has had pain, will benefit from PTT to improve in ROM, strength, functional mobility to DC to home PT Recommendation/Assessment: Patient will need skilled PT in the acute care venue PT Problem List: Decreased strength;Decreased range of motion;Decreased activity tolerance;Decreased knowledge of use of DME;Pain Problem List Comments: pt's wife is disabled will not be able to assist. PT Therapy Diagnosis : Difficulty walking;Acute pain PT Plan PT Frequency: 7X/week PT Treatment/Interventions: DME instruction;Gait training;Stair training;Functional mobility training;Therapeutic exercise;Patient/family education PT Recommendation Recommendations for Other Services: OT consult Follow Up Recommendations: Home health PT Equipment Recommended: None recommended by PT PT Goals  Acute Rehab PT Goals PT Goal Formulation: With patient Time For Goal Achievement: 5 days Pt will go Supine/Side to Sit: Independently;with HOB 0 degrees PT Goal: Supine/Side to Sit - Progress: Not met Pt will go Sit to Supine/Side: Independently;with HOB 0 degrees PT Goal: Sit to Supine/Side - Progress: Not met Pt will go Sit to Stand: with supervision PT Goal: Sit to Stand - Progress: Not met Pt  will go Stand to Sit: with supervision PT Goal: Stand to Sit - Progress: Not met Pt will Ambulate: >150 feet;with supervision;with rolling walker PT Goal: Ambulate - Progress: Not met Pt will Go Up / Down Stairs: 1-2 stairs;with supervision;with least restrictive assistive device PT Goal: Up/Down Stairs - Progress: Not met Pt will Perform Home Exercise Program: with supervision, verbal cues required/provided PT Goal: Perform Home Exercise Program - Progress: Not met  PT Evaluation Precautions/Restrictions  Precautions Precautions: Knee Required Braces or Orthoses: Yes Knee Immobilizer: Discontinue once straight leg raise with < 10 degree lag Restrictions Weight Bearing Restrictions: No Prior Functioning  Home Living Lives With: Spouse (who is disabled) Type of Home: House Home Layout: One level Home Access: Stairs to enter Entrance Stairs-Rails: None Entrance Stairs-Number of Steps: 1 Home Adaptive Equipment: Walker - rolling;Bedside commode/3-in-1;Straight cane Prior Function Level of Independence: Independent with basic ADLs Able to Take Stairs?: Yes Driving: Yes Vocation: On disability Cognition Cognition Arousal/Alertness: Awake/alert Overall Cognitive Status: Appears within functional limits for tasks assessed Orientation Level: Oriented X4 Sensation/Coordination Sensation Light Touch: Appears Intact Coordination Gross Motor Movements are Fluid and Coordinated: Yes Extremity Assessment RUE Assessment RUE Assessment: Within Functional Limits LUE Assessment LUE Assessment: Within Functional Limits RLE Assessment RLE Assessment: Within Functional Limits LLE Assessment LLE Assessment: Exceptions to WFL LLE AROM (degrees) LLE Overall AROM Comments: NT due to pain LLE Strength LLE Overall Strength Comments: able to perform SLR Mobility (including Balance) Bed Mobility Bed Mobility: Yes Supine to Sit: 4: Min assist;With rails Supine to Sit Details (indicate cue  type and reason): assist for Lle to EOB Transfers Transfers: Yes Sit to Stand: 4: Min assist;From elevated surface;From  bed;With upper extremity assist Sit to Stand Details (indicate cue type and reason): VC for hand placement to push up Stand to Sit: 4: Min assist;With armrests;To chair/3-in-1 Stand to Sit Details: assist to support LLE Ambulation/Gait Ambulation/Gait: Yes Ambulation/Gait Assistance: 4: Min assist Ambulation Distance (Feet): 125 Feet Assistive device: Rolling walker Gait Pattern: Step-to pattern  Posture/Postural Control Posture/Postural Control: No significant limitations Exercise    End of Session PT - End of Session Equipment Utilized During Treatment: Left knee immobilizer Activity Tolerance: Patient tolerated treatment well Patient left: in chair;with call bell in reach Nurse Communication: Mobility status for transfers General Behavior During Session: [facility] for tasks performed Cognition: [facility] for tasks performed  [name] 06/24/2011, 1:09 PM

## 2011-06-24 NOTE — Progress Notes (Signed)
Subjective: 1 Day Post-Op Procedure(s) (LRB): TOTAL KNEE REVISION (Left)   Patient reports pain as mild. No events.  Objective:   VITALS:   Filed Vitals:   06/24/11 0555  BP: 96/65  Pulse: 81  Temp: 98.3 F (36.8 C)  Resp: 12    Neurovascular intact Dorsiflexion/Plantar flexion intact Incision: dressing C/D/I No cellulitis present Compartment soft  LABS  Basename 06/24/11 0405  HGB 10.4*  HCT 31.5*  WBC 11.1*  PLT 302     Basename 06/24/11 0405  NA 134*  K 4.2  BUN 15  CREATININE 1.03  GLUCOSE 137*    Assessment/Plan: 1 Day Post-Op Procedure(s) (LRB): TOTAL KNEE REVISION (Left)  HV drain D/C'ed Foley D/C'ed Advance diet Up with therapy D/C IV fluids Discharge home with home health on Thursday / Friday   [name]. Adolfo Granieri   PAC  06/24/2011, 9:05 AM

## 2011-06-24 NOTE — Progress Notes (Signed)
Physical Therapy Treatment Patient Details Name: Brent Ortiz MRN: [id number] DOB: [birth year] Today's Date: 06/24/2011  [id number]  TA, TE, G  PT Assessment/Plan  PT - Assessment/Plan Comments on Treatment Session: Pt states that pain is 9/10, however, tolerated ambulation and exercises.  RN notified of pts request for pain meds. Continue to recommend HHPT for pt at D/C PT Plan: Discharge plan remains appropriate PT Frequency: 7X/week Follow Up Recommendations: Home health PT Equipment Recommended: None recommended by PT PT Goals  Acute Rehab PT Goals PT Goal Formulation: With patient Time For Goal Achievement: 5 days Pt will go Supine/Side to Sit: Independently;with HOB 0 degrees PT Goal: Supine/Side to Sit - Progress: Progressing toward goal Pt will go Sit to Supine/Side: Independently;with HOB 0 degrees PT Goal: Sit to Supine/Side - Progress: Progressing toward goal Pt will go Sit to Stand: with supervision PT Goal: Sit to Stand - Progress: Progressing toward goal Pt will go Stand to Sit: with supervision PT Goal: Stand to Sit - Progress: Progressing toward goal Pt will Ambulate: >150 feet;with supervision;with rolling walker PT Goal: Ambulate - Progress: Progressing toward goal Pt will Perform Home Exercise Program: with supervision, verbal cues required/provided PT Goal: Perform Home Exercise Program - Progress: Progressing toward goal  PT Treatment Precautions/Restrictions  Precautions Precautions: Knee Required Braces or Orthoses: Yes Knee Immobilizer: Discontinue once straight leg raise with < 10 degree lag Restrictions Weight Bearing Restrictions: No Mobility (including Balance) Bed Mobility Bed Mobility: Yes Supine to Sit: 4: Min assist;With rails Supine to Sit Details (indicate cue type and reason): Required assist for LLE  (performed x 2 to allow pt to use restroom. ) Transfers Transfers: Yes Sit to Stand: 4: Min assist;From elevated surface;From bed;With  upper extremity assist;From chair/3-in-1 Sit to Stand Details (indicate cue type and reason): cues for hand placement  (performed x 2 to allow pt to use restroom) Stand to Sit: 4: Min assist;To bed;With upper extremity assist Stand to Sit Details: Required assist with LLE (performed x 2 to allow pt to use restroom ) Ambulation/Gait Ambulation/Gait: Yes Ambulation/Gait Assistance: 4: Min assist Ambulation/Gait Assistance Details (indicate cue type and reason): Required cues for sequencing and technique Ambulation Distance (Feet): 100 Feet Assistive device: Rolling walker Gait Pattern: Step-to pattern Gait velocity: decreased velocity  Posture/Postural Control Posture/Postural Control: No significant limitations Exercise  Total Joint Exercises Ankle Circles/Pumps: AROM;Both;10 reps;Supine Quad Sets: AROM;Left;10 reps;Supine Gluteal Sets: AROM;Both;10 reps;Supine Short Arc Quad: AAROM;Left;10 reps;Supine Heel Slides: AAROM;Left;10 reps;Supine Hip ABduction/ADduction: AAROM;Left;10 reps;Supine Straight Leg Raises: AAROM;Left;10 reps;Supine End of Session PT - End of Session Equipment Utilized During Treatment: Left knee immobilizer Activity Tolerance: Patient tolerated treatment well Patient left: with call bell in reach;in bed Nurse Communication: Other (comment) (RN notified about pts request for pain meds) General Behavior During Session: [facility] for tasks performed Cognition: [facility] for tasks performed  Page, Meribeth Mattes 06/24/2011, 3:30 PM

## 2011-06-24 NOTE — Progress Notes (Signed)
Utilization review completed.  

## 2011-06-25 ENCOUNTER — Encounter (HOSPITAL_COMMUNITY): Payer: Self-pay | Admitting: Orthopedic Surgery

## 2011-06-25 LAB — BASIC METABOLIC PANEL
CO2: 22 mEq/L (ref 19–32)
Chloride: 99 mEq/L (ref 96–112)
GFR calc Af Amer: >90 mL/min (ref >90)
Potassium: 4.2 mEq/L (ref 3.5–5.1)
Sodium: 132 mEq/L — ABNORMAL LOW (ref 135–145)

## 2011-06-25 LAB — GLUCOSE, CAPILLARY
Glucose-Capillary: 140 mg/dL — ABNORMAL HIGH (ref 70–99)
Glucose-Capillary: 110 mg/dL — ABNORMAL HIGH (ref 70–99)

## 2011-06-25 LAB — CBC
MCV: 87.4 fL (ref 78.0–100.0)
Platelets: ADEQUATE 10*3/uL (ref 150–400)
RBC: 3.26 MIL/uL — ABNORMAL LOW (ref 4.22–5.81)
WBC: 8.9 10*3/uL (ref 4.0–10.5)

## 2011-06-25 MED ORDER — ACETAMINOPHEN 325 MG PO TABS: 650.0000 mg | ORAL_TABLET | Freq: Four times a day (QID) | ORAL | Status: DC | PRN

## 2011-06-25 MED ORDER — DIPHENHYDRAMINE HCL 25 MG PO CAPS: 25.0000 mg | ORAL_CAPSULE | Freq: Four times a day (QID) | ORAL | Status: AC | PRN

## 2011-06-25 MED ORDER — DSS 100 MG PO CAPS: 100.0000 mg | ORAL_CAPSULE | Freq: Two times a day (BID) | ORAL | Status: AC

## 2011-06-25 MED ORDER — FERROUS SULFATE 325 (65 FE) MG PO TABS: 325.0000 mg | ORAL_TABLET | Freq: Three times a day (TID) | ORAL | Status: DC

## 2011-06-25 MED ORDER — POLYETHYLENE GLYCOL 3350 17 G PO PACK: 17.0000 g | PACK | Freq: Two times a day (BID) | ORAL | Status: AC

## 2011-06-25 NOTE — Op Note (Signed)
NAME:  Brent Ortiz NO.:  [id number]  MEDICAL RECORD NO.:  [id number]  LOCATION:  1605                         FACILITY:  [facility]  PHYSICIAN:  Madlyn Frankel. Charlann Boxer, M.D.  DATE OF BIRTH:  07-03-1961  DATE OF PROCEDURE:  06/13/2011 DATE OF DISCHARGE:                              OPERATIVE REPORT   PREOPERATIVE DIAGNOSIS:  Failed painful left total knee.  POSTOPERATIVE DIAGNOSES: 1. Failed painful left total knee. 2. Significant cardiac issue of recent myocardial infarction treated     with Cardiology and cleared.  PROCEDURE:  Revision of left total knee replacement.  COMPONENTS USED:  DePuy knee system with a size 4 left TC3 component with a 13 x 30 cemented stem, an 8 mm distal, medial, and lateral augments.  The tibial component was a size 4 MBT revision tray with 5 mm medial and lateral augments and that the lateral augment was used with a size 3 to prevent any potential overhanging, though with trial reduction none was noted.  A 12.5 tibial rotating platform insert, posterior stabilized __________ TC3 insert.  Cement restrictor used on both sides.  SURGEON:  Madlyn Frankel. Charlann Boxer, M.D.  ASSISTANT:  Lanney Gins, PA  ANESTHESIA:  Preoperative regional femoral nerve block plus general anesthetic.  SPECIMENS:  None, as joint fluid was clear.  Preoperative workup was negative for concern for infection.  DRAINS:  1 Hemovac into the left knee.  TOURNIQUET TIME:  84 minutes at 250 mmHg.  INDICATION FOR PROCEDURE:  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  He had persistent pain.  There was concern raised by the primary surgeon about the position and orientation of the tibial component and the source of pain. Radiographically, the workup was negative for any fracture.  The tibial component appeared to be perhaps a little bit of anterior slope with the prosthetic keel resting at the posterior cortex of the  tibia, but no fracture identified.  His postop course was complicated by a myocardial infarction, for which he was treated by Cardiology, was subsequently referred for surgical consideration and management due to the complex medical issues.  We had a lengthy discussion in the office.  With his persistent pain, he decided after the discussion to proceed with revision surgery.  Risks of infection, DVT were discussed.  In addition to the persistence of discomfort, the fact that this surgery may not clear up all of his source of problems as well as reviewing the postop course and expectations from we had in addition to reviewing his medical risks and comorbidities.  A consent was obtained for the benefit of pain relief and functional improvement.  PROCEDURE IN DETAIL:  The patient was brought to the operative theater. Once adequate anesthesia, preoperative antibiotics, Ancef administered, the patient was positioned supine with the left thigh tourniquet placed. The left lower extremity was then prepped and draped in sterile fashion with the left leg placed in [facility] leg holder.  Time-out was performed identifying the patient, planned procedure, and extremity.  The leg was exsanguinated, tourniquet elevated to 250 mmHg. His old incision was used which was an oblique incision with a medial  based portion that angled up and to the anterior aspect of the distal thigh.  I excised the widened scar proximally.  Soft tissue planes were created.  A median arthrotomy was then made encountering clear synovial fluid.  Once the knee was exposed including the medial and lateral synovectomy and suprapatellar synovectomy, the patella was able to be subluxated laterally.  Note that I did evert the patella at this point, retained the old patellar component, but debrided synovium and soft tissue scarring around this area to help with exposure of the knee.  A proximal medial peel was carried out to allow for  exposure of the tibial component.  The tibia was then subluxated anteriorly.  Using a thin ACL saw, was able to undermine the cement bone interface and was able to elevate the tibial component without apparent complicating issues.  This was kept in place as now, I attended to the femur.  Using a thin ACL saw, the anterior chamfer and distal aspect of femur were undermined using this ACL saw, both medially and laterally.  Osteotomes were then used in combination to remove the femoral component.  There was very little bone loss from removal of femoral component.  At this point, the tibia was subluxated anteriorly again with the femoral component off.  The tibial component was able to be elevated again without significant bone loss.  At this point, the tibial cement was removed, drilling through the central portion of the cement mantle and using osteotomes to remove the cement.  The distal femoral cement was also removed.  At this point, we attended the preparation of the tibial component.  The patient had a relatively abnormal appearing proximal tibia with a large tibial tubercle base.  We kept this in mind and oriented the rotating platform revision components.  With an extramedullary guide in place, I freshened up the proximal tibial cut, assuring alignment both in the coronal and sagittal planes to allow for 2 degrees of posterior slope for this component.  I sat and chose a size 4 tibial tray, which fit the cut surface best, again orienting the tray so there would be no overhang, but also fit more anterior on the proximal tibia.  This was pinned into position, drilled and at this point, we planned on using a 29 cemented sleeve on the tibial side based on the fact that the patient had previous cemented component removed.  At this point, I kept the trial component in place and now attended to the femur.  Femoral preparation was carried out by hand reaming up to 15 mm.  An  intramedullary rod was placed and kept in position.  When I did my distal femoral cut, I did remove about a 2 mm of bone, which was some of the old cement sclerotic bone.  I felt it was important to do so.  We then chose a size 4 femoral component, size 4 rotation block was then pinned into position with rotation set based off the proximal tibial component.  It was pinned in position with +2 bolt.  Anterior cut was evaluated.  The previous femoral component was noted to be a little bit flexed and so this was noted to be, have no contact.  Limited posterior bone was removed.  Chamfer cuts were made.  I then made a box cut for TC3 component to enhance stability.  At this point, we did a trial reduction with this size 4 TC3 femoral component with no augments and the tibial component placed,  what we noted was hyperextension as well as a mismatch gap in flexion.  I went ahead and placed distal augments on the femur up to an 8 mm medial and lateral, and used a 17.5 insert with this, despite the fact that he had only had 11 mm insert and very little bone removed.  There was no ligament injury.  The knee now came to full extension with this augments in the 17.5 tibia.  The patella tracked through the trochlea without application of pressure.  Overall, the knee at this point appeared to be nicely balanced.  At this point, trial components removed.  I broached the proximal tibia to allow for 29 cemented sleeve.  I chose as my final components as size 4 MBT revision tray at 29 cemented sleeve with 5 mm medial lateral augments.  The femoral component was constructed as noted including 8 mm medial lateral augments.  The trial components were removed.  The synovial capsule junction and knee was injected with 50 mL of 0.25% Marcaine with epinephrine.  The knee was copiously irrigated with normal saline solution.  This included the canals and the joint __________ satisfactorily debrided and all cement  was removed.  We placed a cement restrictor in the distal femur and proximal tibia.  The final components were opened and configured on the back table, confirming augment location and position as well as a +2 with a 5 degree bolt.  At this point, the cement was mixed, was cemented and was injected into the tibia and then the tibial components cemented in place.  The femoral canal was then cemented in place and the knee was brought to extension with a 12.5 insert.  The knee came to full extension.  It was held there while the cement cured.  Excessive cement was removed throughout the knee.  Once the cement had fully cured, excess cement was removed throughout the knee.  The final 12.5 insert was chosen based on the range of motion, stability of the knee.  The knee was re-irrigated with normal saline solution.  The tourniquet had been let down after 84 minutes without significant hemostasis.  The extensor mechanism was then reapproximated over the Hemovac drain with the knee in flexion using 1 Vicryl.  The remaining wound was closed with 2-0 Vicryl and running 4-0 Monocryl.  Note again that this wound was utilized and was noted to have 2 separate arms.  Basically, there was no evidence of any previous skin break down.  We did use 4-0 Monocryl on the closure.  The knee was cleaned and dressed with Dermabond and Aquacel dressing.  Drain site was dressed separately. Knee wrapped into a sterile Ace wrap.  He was then brought to recovery in stable condition, extubated tolerating the procedure well.     Madlyn Frankel Charlann Boxer, M.D.     MDO/MEDQ  D:  06/25/2011  T:  06/25/2011  Job:  [id number]

## 2011-06-25 NOTE — Progress Notes (Signed)
OT Note:  Order received, chart reviewed, brief contact with pt.  Pt declined OT eval stating he has all necessary equipment and will have assist of his family for ADL.  This is a TKA revision for pt.  Did not proceed with eval.  Signing off. 06/25/2011 Martie Round, OTR/L Pager: [phone]

## 2011-06-25 NOTE — Brief Op Note (Signed)
06/23/2011  8:44 AM  PATIENT:  Brent Ortiz  50 y.o. male  PRE-OPERATIVE DIAGNOSIS:  Failed Left Total Knee Replacement  POST-OPERATIVE DIAGNOSIS:  Failed Left Total Knee Replacement  PROCEDURE:  Procedure(s): TOTAL KNEE REVISION, LEFT  SURGEON:  Surgeon(s): Shelda Pal  PHYSICIAN ASSISTANT: Lanney Gins, PA-C   ANESTHESIA:   regional and general  EBL:     BLOOD ADMINISTERED:none  DRAINS: (1 medium) Hemovact drain(s) in the left knee with  Suction Open   LOCAL MEDICATIONS USED:  MARCAINE 50CC  SPECIMEN:  No Specimen  DISPOSITION OF SPECIMEN:  N/A  COUNTS:  YES  TOURNIQUET:   Total Tourniquet Time Documented: Thigh (Left) - 84 minutes  DICTATION: .Other Dictation: Dictation Number [id number]  PLAN OF CARE: Admit to inpatient   PATIENT DISPOSITION:  PACU - hemodynamically stable.   Delay start of Pharmacological VTE agent (>24hrs) due to surgical blood loss or risk of bleeding:  {YES/NO/NOT APPLICABLE:20182

## 2011-06-25 NOTE — Progress Notes (Signed)
Subjective: 2 Days Post-Op Procedure(s) (LRB): TOTAL KNEE REVISION (Left)   Patient reports pain as moderate. No events. Pain medicines were increased yesterday, still a lot of pain, but feeling better.  Objective:   VITALS:   Filed Vitals:   06/25/11 0550  BP: 91/62  Pulse: 97  Temp: 98.2 F (36.8 C)  Resp: 16    Neurovascular intact Dorsiflexion/Plantar flexion intact Incision: dressing C/D/I No cellulitis present Compartment soft  LABS  Basename 06/25/11 0357 06/24/11 0405  HGB 9.8* 10.4*  HCT 28.5* 31.5*  WBC 8.9 11.1*  PLT PLATELET CLUMPS NOTED ON SMEAR, COUNT APPEARS ADEQUATE 302     Basename 06/25/11 0357 06/24/11 0405  NA 132* 134*  K 4.2 4.2  BUN 12 15  CREATININE 1.06 1.03  GLUCOSE 125* 137*     Assessment/Plan: 2 Days Post-Op Procedure(s) (LRB): TOTAL KNEE REVISION (Left)   Up with therapy Plan for discharge tomorrow to home with HHPT   Brent Ortiz. Brent Ortiz   PAC  06/25/2011, 8:03 AM

## 2011-06-25 NOTE — Progress Notes (Signed)
Physical Therapy Treatment Patient Details Name: Brent Ortiz MRN: [id number] DOB: 07/05/1961 Today's Date: 06/25/2011  PT Assessment/Plan  PT - Assessment/Plan Comments on Treatment Session: Pt states that he is feeling better, tolerating activity well..  RN made aware of pts pain level.  Pt motivated to participate in activity.  continue to recommend HHPT for pt at D/C.  PT Plan: Discharge plan remains appropriate PT Frequency: 7X/week Follow Up Recommendations: Home health PT Equipment Recommended: None recommended by PT PT Goals  Acute Rehab PT Goals PT Goal Formulation: With patient Time For Goal Achievement: 5 days Pt will go Supine/Side to Sit: Independently;with HOB 0 degrees PT Goal: Supine/Side to Sit - Progress: Progressing toward goal Pt will go Sit to Supine/Side: Independently;with HOB 0 degrees PT Goal: Sit to Supine/Side - Progress: Progressing toward goal Pt will go Sit to Stand: with supervision PT Goal: Sit to Stand - Progress: Met Pt will go Stand to Sit: with supervision PT Goal: Stand to Sit - Progress: Progressing toward goal Pt will Ambulate: >150 feet;with supervision;with rolling walker PT Goal: Ambulate - Progress: Progressing toward goal Pt will Perform Home Exercise Program: with supervision, verbal cues required/provided PT Goal: Perform Home Exercise Program - Progress: Progressing toward goal  PT Treatment Precautions/Restrictions  Precautions Precautions: Knee Required Braces or Orthoses: Yes Knee Immobilizer: Discontinue once straight leg raise with < 10 degree lag Restrictions Weight Bearing Restrictions: No Mobility (including Balance) Bed Mobility Bed Mobility: Yes Supine to sit with supervision(HOB elevated approx 30 deg) Sit to Supine - Left Details (indicate cue type and reason): requires assist with LLE Transfers Transfers: Yes Sit to Stand: 5: Supervision Sit to Stand Details (indicate cue type and reason): requires cues for  hand placement Stand to Sit: 4: Min assist toilet and recliner;With upper extremity assist Stand to Sit Details: requires assist with LLE and cues for RW placement for safety Ambulation/Gait Ambulation/Gait: Yes Ambulation/Gait Assistance: 4: Min assist Ambulation/Gait Assistance Details (indicate cue type and reason): Required cues for posture Ambulation Distance (Feet): 20 x2 Assistive device: Rolling walker Gait Pattern: Step-through pattern Gait velocity: decreased velocity  Posture/Postural Control Posture/Postural Control: No significant limitations Exercise  Total Joint Exercises Ankle Circles/Pumps: AROM;Both;10 reps;Supine Quad Sets: AROM;Left;10 reps;Supine Gluteal Sets: AROM;Both;10 reps;Supine Short Arc Quad: AAROM;Left;10 reps;Supine Heel Slides: AAROM;Left;10 reps;Supine Hip ABduction/ADduction: AAROM;Left;10 reps;Supine Straight Leg Raises: AAROM;Left;10 reps;Supine End of Session PT - End of Session Equipment Utilized During Treatment: Left knee immobilizer Activity Tolerance: Patient limited by pain Patient left:recliner;with call bell in reach Nurse Communication: Other (comment) ( General Behavior During Session: [facility] for tasks performed Cognition: [facility] for tasks performed  [name] 06/25/2011, 3:51 PM

## 2011-06-25 NOTE — Progress Notes (Signed)
Physical Therapy Treatment Patient Details Name: Brent Ortiz MRN: [id number] DOB: 03-Jan-1961 Today's Date: 06/25/2011  1310-1325 G  PT Assessment/Plan  PT - Assessment/Plan Comments on Treatment Session: Pt states that pain increases to 7/10 with ambulation.  RN made aware of pts pain level.  Pt motivated to participate in activity.  continue to recommend HHPT for pt at D/C.  PT Plan: Discharge plan remains appropriate PT Frequency: 7X/week Follow Up Recommendations: Home health PT Equipment Recommended: None recommended by PT PT Goals  Acute Rehab PT Goals PT Goal Formulation: With patient Time For Goal Achievement: 5 days Pt will go Sit to Supine/Side: Independently;with HOB 0 degrees PT Goal: Sit to Supine/Side - Progress: Progressing toward goal Pt will go Sit to Stand: with supervision PT Goal: Sit to Stand - Progress: Met Pt will go Stand to Sit: with supervision PT Goal: Stand to Sit - Progress: Progressing toward goal Pt will Ambulate: >150 feet;with supervision;with rolling walker PT Goal: Ambulate - Progress: Progressing toward goal  PT Treatment Precautions/Restrictions  Precautions Precautions: Knee Required Braces or Orthoses: Yes Knee Immobilizer: Discontinue once straight leg raise with < 10 degree lag Restrictions Weight Bearing Restrictions: No Mobility (including Balance) Bed Mobility Bed Mobility: Yes Sit to Supine - Left: 4: Min assist;HOB elevated (comment degrees) (HOB elevated approx 30 deg) Sit to Supine - Left Details (indicate cue type and reason): requires assist with LLE Transfers Transfers: Yes Sit to Stand: 5: Supervision Sit to Stand Details (indicate cue type and reason): requires cues for hand placement Stand to Sit: 4: Min assist;To bed;With upper extremity assist Stand to Sit Details: requires assist with LLE and cues for RW placement for safety Ambulation/Gait Ambulation/Gait: Yes Ambulation/Gait Assistance: 4: Min  assist Ambulation/Gait Assistance Details (indicate cue type and reason): Required cues for posture Ambulation Distance (Feet): 120 Feet Assistive device: Rolling walker Gait Pattern: Step-through pattern Gait velocity: decreased velocity  Posture/Postural Control Posture/Postural Control: No significant limitations Exercise    End of Session PT - End of Session Equipment Utilized During Treatment: Left knee immobilizer Activity Tolerance: Patient limited by pain Patient left: in bed;with call bell in reach Nurse Communication: Other (comment) (RN made aware of pts pain level) General Behavior During Session: [facility] for tasks performed Cognition: [facility] for tasks performed  Page, Meribeth Mattes 06/25/2011, 2:01 PM

## 2011-06-25 NOTE — Progress Notes (Signed)
Physical Therapy Treatment Patient Details Name: Brent Ortiz MRN: [id number] DOB: 01-20-1961 Today's Date: 06/25/2011 852-900 g PT Assessment/Plan  PT - Assessment/Plan Comments on Treatment Session: Pt states that pain is better but he did not sleep last night due to pain.Pt motivated to participate in activity.  continue to recommend HHPT for pt at D/C.  PT Plan: Discharge plan remains appropriate PT Frequency: 7X/week Follow Up Recommendations: Home health PT Equipment Recommended: None recommended by PT PT Goals  Acute Rehab PT Goals PT Goal Formulation: With patient Time For Goal Achievement: 5 days Pt will go Sit to Supine/Side: Independently;with HOB 0 degrees PT Goal: Sit to Supine/Side - Progress: Progressing toward goal Pt will go Sit to Stand: with supervision PT Goal: Sit to Stand - Progress: Met Pt will go Stand to Sit: with supervision PT Goal: Stand to Sit - Progress: Progressing toward goal Pt will Ambulate: >150 feet;with supervision;with rolling walker PT Goal: Ambulate - Progress: Progressing toward goal  PT Treatment Precautions/Restrictions  Precautions Precautions: Knee Required Braces or Orthoses: Yes Knee Immobilizer: Discontinue once straight leg raise with < 10 degree lag Restrictions Weight Bearing Restrictions: No Mobility (including Balance) Bed Mobility Bed Mobility: Yes Sit to Supine - Right: 4: Min assist;HOB flat Sit to Supine - Left: 4: Min assist;HOB elevated (comment degrees) (HOB elevated approx 30 deg) Sit to Supine - Left Details (indicate cue type and reason): requires assist with LLE Transfers Transfers: Yes Sit to Stand: 5: Supervision Sit to Stand Details (indicate cue type and reason): requires cues for hand placement Stand to Sit: 4: supervisionTo bed;With upper extremity assist Stand to Sit Details: requires assist with LLE and cues for RW placement for safety Ambulation/Gait Ambulation/Gait: Yes Ambulation/Gait  Assistance: 4: Min assist Ambulation/Gait Assistance Details (indicate cue type and reason): Required cues for posture Ambulation Distance (Feet): 120 Feet Assistive device: Rolling walker Gait Pattern: Step-through pattern Gait velocity: decreased velocity  Posture/Postural Control Posture/Postural Control: No significant limitations Exercise    End of Session PT - End of Session Equipment Utilized During Treatment: Left knee immobilizer Activity Tolerance: Patient limited by pain Patient left: in bed;with call bell in reach Nurse Communication: Other (comment) (RN made aware of pts pain level) General Behavior During Session: [facility] for tasks performed Cognition: [facility] for tasks performed  [name] 06/25/2011, 3:41 PM

## 2011-06-25 NOTE — Discharge Summary (Signed)
Physician Discharge Summary  Patient ID: Brent Ortiz MRN: [id number] DOB/AGE: 01-14-1961 50 y.o.  Admit date: 06/23/2011 Discharge date: 06/26/2011  Procedures:  Procedure(s) (LRB): TOTAL KNEE REVISION (Left)  Attending Physician: Shelda Pal   Admission Diagnoses: Failed total knee implant, left knee.    Discharge Diagnoses:  Principal Problem:  *S/P left knee revision Diabetes CAD with history of MI Hyperlipidemia Reflux   HPI: This is a 50 year old gentleman with a history of total knee arthroplasty of his right knee in February 2012 and left knee in March 2012 with mechanical loosening of the left knee. Due to continued pain, discomfort, and problems, he is now scheduled for total knee arthroplasty revision of his left knee. The surgery, risks, benefits, and aftercare were discussed in detail with the patient. Questions invited and answered.    PCP: No primary provider on file.   Discharged Condition: good  Hospital Course:  Patient underwent the above stated procedure on 06/23/2011. Patient tolerated the procedure well and brought to the recovery room in good condition and subsequently to the floor.  POD #1 BP: 96/65 ; Pulse: 81 ; Temp: 98.3 F Pt's foley was removed, as well as the hemovac drain removed. IV was changed to a saline lock. Patient reports pain as severe, pain medicines were changed. No events otherwise. Neurovascular intact, dorsiflexion/plantar flexion intact, incision: dressing C/D/I, no cellulitis present and compartment soft.  LABS  Basename  06/24/11 0405   HGB  10.4*   HCT  31.5*    POD #2  BP: 91/62 ; Pulse: 97 ; Temp: 98.2 F Patient reports pain as moderate. No events. Pain medicines were increased yesterday, still a lot of pain, but feeling better. Neurovascular intact, dorsiflexion/plantar flexion intact, incision: dressing C/D/I, no cellulitis present and compartment soft.  LABS  Basename  06/25/11 0357    HGB  9.8*    HCT  28.5*      POD #3  BP: 106/71 ; Pulse: 108 ; Temp: 98.9 F Patient reports pain as mild. No events. Feels that he is ready to be discharged home. Neurovascular intact, dorsiflexion/plantar flexion intact, incision: dressing C/D/I, no cellulitis present and compartment soft.  LABS   No new labs  Discharge Exam: Extremities: Homans sign is negative, no sign of DVT, no edema, redness or tenderness in the calves or thighs and no ulcers, gangrene or trophic changes  Disposition: Home with HHPT. Follow up in 2 weeks at [facility].  Discharge Orders    Future Orders Please Complete By Expires   Diet - low sodium heart healthy      Call MD / Call 911      Comments:   If you experience chest pain or shortness of breath, CALL 911 and be transported to the hospital emergency room.  If you develope a fever above 101 F, pus (white drainage) or increased drainage or redness at the wound, or calf pain, call your surgeon's office.   Discharge instructions      Comments:   Maintain surgical dressing for 8 days, then replace with gauze and tape. Keep the area dry and clean until follow up. Follow up in 2 weeks at [facility]. Call with any questions or concerns.     Constipation Prevention      Comments:   Drink plenty of fluids.  Prune juice may be helpful.  You may use a stool softener, such as Colace (over the counter) 100 mg twice a day.  Use MiraLax (over the  counter) for constipation as needed.   Increase activity slowly as tolerated      Weight Bearing as taught in Physical Therapy      Comments:   Use a walker or crutches as instructed.   Driving restrictions      Comments:   No driving for 4 weeks   TED hose      Comments:   Use stockings (TED hose) for 2 weeks on both leg(s).  You may remove them at night for sleeping.   Change dressing      Comments:   Maintain surgical dressing for 8 days, then change the dressing daily with sterile 4 x 4 inch gauze dressing and  tape. Keep area dry and clean.       Current Discharge Medication List    START taking these medications   Details  diphenhydrAMINE (BENADRYL) 25 mg capsule Take 1 capsule (25 mg total) by mouth every 6 (six) hours as needed for itching, allergies or sleep.    docusate sodium 100 MG CAPS Take 100 mg by mouth 2 (two) times daily.    ferrous sulfate 325 (65 FE) MG tablet Take 1 tablet (325 mg total) by mouth 3 (three) times daily after meals.    methocarbamol (ROBAXIN) 500 MG tablet Take 1 tablet (500 mg total) by mouth every 6 (six) hours as needed (muscle spasms).    polyethylene glycol (MIRALAX / GLYCOLAX) packet Take 17 g by mouth 2 (two) times daily.      CONTINUE these medications which have CHANGED   Details  aspirin EC 81 MG tablet Take 1 tablet (81 mg total) by mouth daily.   Comments: Do not take the Aspirin 325 mg which was written previously. Only take the Aspirin 81mg  with the Effient.    oxyCODONE-acetaminophen (PERCOCET) 10-325 MG per tablet Take 1-2 tablets by mouth every 6 (six) hours as needed for pain. Qty: 30 tablet, Refills: 0      CONTINUE these medications which have NOT CHANGED   Details  amLODipine (NORVASC) 2.5 MG tablet Take 2.5 mg by mouth every morning.     lisinopril (PRINIVIL,ZESTRIL) 10 MG tablet Take 10 mg by mouth every morning.     metoprolol (TOPROL-XL) 200 MG 24 hr tablet Take 200 mg by mouth every morning.     nitroGLYCERIN (NITROSTAT) 0.4 MG SL tablet Place 0.4 mg under the tongue every 5 (five) minutes as needed. For chest pain    pantoprazole (PROTONIX) 40 MG tablet Take 40 mg by mouth every morning.     prasugrel (EFFIENT) 10 MG TABS Take 10 mg by mouth every evening.     rosuvastatin (CRESTOR) 20 MG tablet Take 20 mg by mouth daily.     sitaGLIPtin (JANUVIA) 100 MG tablet Take 100 mg by mouth every morning.       STOP taking these medications     cyclobenzaprine (FLEXERIL) 10 MG tablet Comments:  Reason for Stopping:        oxyCODONE-acetaminophen (PERCOCET) 5-325 MG per tablet Comments:  Reason for Stopping:          Signed:  [name]. Ronaldo Crilly   PAC  06/26/2011, 8:39 AM

## 2011-06-25 NOTE — Progress Notes (Signed)
06/25/2011 Colleen Can, RN BSN CCM [phone] CM spoke with patient. Plans are for pt to return to his home in [facility] [address] where spouse will be caregiver. Pt states he already has RW, BSC and reacher. Would like to use [facility]. CM spoke with [facility] and they will not be able to provide [facility] til next week. Spoke with [facility] who can provide services day after discharge. Spoke with pt and he wishes to use [place].

## 2011-06-26 LAB — GLUCOSE, CAPILLARY

## 2011-06-26 MED ORDER — ASPIRIN EC 81 MG PO TBEC: 81.0000 mg | DELAYED_RELEASE_TABLET | Freq: Every day | ORAL | Status: DC

## 2011-06-26 MED ORDER — HYDROMORPHONE HCL PF 1 MG/ML IJ SOLN: 0.2500 mg | INTRAMUSCULAR | Status: DC | PRN

## 2011-06-26 MED ORDER — OXYCODONE-ACETAMINOPHEN 10-325 MG PO TABS: 1.0000 | ORAL_TABLET | Freq: Four times a day (QID) | ORAL | Status: AC | PRN

## 2011-06-26 MED ORDER — METHOCARBAMOL 500 MG PO TABS: 500.0000 mg | ORAL_TABLET | Freq: Four times a day (QID) | ORAL | Status: AC | PRN

## 2011-06-26 MED ORDER — LACTATED RINGERS IV SOLN: INTRAVENOUS | Status: DC

## 2011-06-26 MED ORDER — PROMETHAZINE HCL 25 MG/ML IJ SOLN: 6.2500 mg | INTRAMUSCULAR | Status: DC | PRN

## 2011-06-26 NOTE — Progress Notes (Signed)
Subjective: 3 Days Post-Op Procedure(s) (LRB): TOTAL KNEE REVISION (Left)   Patient reports pain as mild. No events. Feels ready to be discharged home.  Objective:   VITALS:   Filed Vitals:   06/26/11 0610  BP: 106/71  Pulse: 108  Temp: 98.9 F (37.2 C)  Resp: 18    Neurovascular intact Dorsiflexion/Plantar flexion intact Incision: scant drainage No cellulitis present Compartment soft  LABS No new labs   Assessment/Plan: 3 Days Post-Op Procedure(s) (LRB): TOTAL KNEE REVISION (Left)   Up with therapy Discharge home with home health today after PT   [name]. Carlen Rebuck   PAC  06/26/2011, 8:23 AM

## 2011-06-26 NOTE — Progress Notes (Signed)
Physical Therapy Treatment Patient Details Name: Brent Ortiz MRN: [id number] DOB: 29-Mar-1961 Today's Date: 06/26/2011  [phone] G, TE  PT Assessment/Plan  PT - Assessment/Plan Comments on Treatment Session: Pt states that he is feeling much better today with little pain.  States that he is eager to return home today.  Performed ambulation/steps, transfers and exercises well without knee immobilizer.  Plans to D/C today and recieve HHPT.   PT Plan: Discharge plan remains appropriate PT Frequency: 7X/week Follow Up Recommendations: Home health PT Equipment Recommended: None recommended by PT PT Goals  Acute Rehab PT Goals PT Goal Formulation: With patient Time For Goal Achievement: 5 days Pt will go Supine/Side to Sit: Independently;with HOB 0 degrees PT Goal: Supine/Side to Sit - Progress: Partly met Pt will go Sit to Supine/Side: Independently;with HOB 0 degrees PT Goal: Sit to Supine/Side - Progress: Partly met Pt will go Sit to Stand: with supervision PT Goal: Sit to Stand - Progress: Met Pt will go Stand to Sit: with supervision PT Goal: Stand to Sit - Progress: Met Pt will Ambulate: >150 feet;with supervision;with rolling walker PT Goal: Ambulate - Progress: Met Pt will Go Up / Down Stairs: 1-2 stairs;with supervision;with least restrictive assistive device PT Goal: Up/Down Stairs - Progress: Met Pt will Perform Home Exercise Program: with supervision, verbal cues required/provided PT Goal: Perform Home Exercise Program - Progress: Met  PT Treatment Precautions/Restrictions  Precautions Precautions: Knee Required Braces or Orthoses: Yes Knee Immobilizer: Discontinue once straight leg raise with < 10 degree lag Restrictions Weight Bearing Restrictions: No Mobility (including Balance) Bed Mobility Bed Mobility: Yes Supine to Sit: 5: Supervision;With rails;HOB elevated (Comment degrees) Sit to Supine - Right: 5: Supervision;HOB elevated (comment degrees) Sit to  Supine - Right Details (indicate cue type and reason): Pt able to assist LLE into bed during sessionl  Transfers Transfers: Yes Sit to Stand: 5: Supervision Sit to Stand Details (indicate cue type and reason): required cues for hand placement, able to perform mobility w/o knee immobilizer  Stand to Sit: 5: Supervision;With upper extremity assist;With armrests;To bed;To chair/3-in-1 Stand to Sit Details: requires cues to stay inside walker until seated and UE use Ambulation/Gait Ambulation/Gait: Yes Ambulation/Gait Assistance: 5: Supervision Ambulation/Gait Assistance Details (indicate cue type and reason): requires assist for upright posture.  Ambulation Distance (Feet): 160 Feet Assistive device: Rolling walker Gait Pattern: Step-through pattern Gait velocity: WFL Stairs: Yes Stairs Assistance: 5: Supervision Stairs Assistance Details (indicate cue type and reason): performed 1 step x 8 reps with cues for proper sequencing/technique.   Stair Management Technique: No rails;Forwards;Backwards;With walker Number of Stairs: 1  (x 8 reps to ensure safety and proper technique) Height of Stairs: 6   Posture/Postural Control Posture/Postural Control: No significant limitations Exercise  Total Joint Exercises Ankle Circles/Pumps: AROM;Left;15 reps;Supine Quad Sets: AROM;Left;15 reps;Supine Short Arc Quad: AROM;Left;Other reps (comment);Supine (x15 reps) Heel Slides: AAROM;Left;15 reps;Supine Hip ABduction/ADduction: AAROM;Left;15 reps;Supine Straight Leg Raises: AAROM;Left;15 reps;Supine End of Session PT - End of Session Activity Tolerance: Patient tolerated treatment well Patient left: in chair;with call bell in reach General Behavior During Session: [facility] for tasks performed Cognition: [facility] for tasks performed  Page, Meribeth Mattes 06/26/2011, 9:53 AM

## 2011-07-18 DIAGNOSIS — G4733 Obstructive sleep apnea (adult) (pediatric): Secondary | ICD-10-CM | POA: Insufficient documentation

## 2011-07-18 HISTORY — DX: Obstructive sleep apnea (adult) (pediatric): G47.33

## 2011-09-03 ENCOUNTER — Encounter (HOSPITAL_COMMUNITY): Payer: Self-pay | Admitting: Anesthesiology

## 2011-09-03 ENCOUNTER — Ambulatory Visit (HOSPITAL_COMMUNITY): Payer: Medicaid Other

## 2011-09-03 ENCOUNTER — Inpatient Hospital Stay (HOSPITAL_COMMUNITY)
Admission: AD | Admit: 2011-09-03 | Discharge: 2011-09-07 | DRG: 485 | Disposition: A | Discharge status: Home Health | Payer: Medicaid Other | Source: Ambulatory Visit | Attending: Orthopedic Surgery | Admitting: Orthopedic Surgery

## 2011-09-03 ENCOUNTER — Encounter (HOSPITAL_COMMUNITY)
Admission: AD | Disposition: A | Discharge status: Home Health | Payer: Self-pay | Source: Ambulatory Visit | Attending: Orthopedic Surgery

## 2011-09-03 ENCOUNTER — Ambulatory Visit (HOSPITAL_COMMUNITY): Payer: Medicaid Other | Admitting: Anesthesiology

## 2011-09-03 DIAGNOSIS — Y831 Surgical operation with implant of artificial internal device as the cause of abnormal reaction of the patient, or of later complication, without mention of misadventure at the time of the procedure: Secondary | ICD-10-CM | POA: Diagnosis present

## 2011-09-03 DIAGNOSIS — R300 Dysuria: Secondary | ICD-10-CM | POA: Diagnosis not present

## 2011-09-03 DIAGNOSIS — I2510 Atherosclerotic heart disease of native coronary artery without angina pectoris: Secondary | ICD-10-CM | POA: Diagnosis present

## 2011-09-03 DIAGNOSIS — M1710 Unilateral primary osteoarthritis, unspecified knee: Secondary | ICD-10-CM | POA: Diagnosis present

## 2011-09-03 DIAGNOSIS — I10 Essential (primary) hypertension: Secondary | ICD-10-CM | POA: Diagnosis present

## 2011-09-03 DIAGNOSIS — E119 Type 2 diabetes mellitus without complications: Secondary | ICD-10-CM | POA: Diagnosis present

## 2011-09-03 DIAGNOSIS — J189 Pneumonia, unspecified organism: Secondary | ICD-10-CM

## 2011-09-03 DIAGNOSIS — Z96659 Presence of unspecified artificial knee joint: Secondary | ICD-10-CM

## 2011-09-03 DIAGNOSIS — T8454XA Infection and inflammatory reaction due to internal left knee prosthesis, initial encounter: Secondary | ICD-10-CM

## 2011-09-03 DIAGNOSIS — D72829 Elevated white blood cell count, unspecified: Secondary | ICD-10-CM

## 2011-09-03 DIAGNOSIS — B954 Other streptococcus as the cause of diseases classified elsewhere: Secondary | ICD-10-CM | POA: Diagnosis present

## 2011-09-03 DIAGNOSIS — E871 Hypo-osmolality and hyponatremia: Secondary | ICD-10-CM | POA: Diagnosis not present

## 2011-09-03 DIAGNOSIS — I252 Old myocardial infarction: Secondary | ICD-10-CM

## 2011-09-03 DIAGNOSIS — E876 Hypokalemia: Secondary | ICD-10-CM | POA: Diagnosis not present

## 2011-09-03 DIAGNOSIS — T8459XA Infection and inflammatory reaction due to other internal joint prosthesis, initial encounter: Secondary | ICD-10-CM

## 2011-09-03 DIAGNOSIS — T8450XA Infection and inflammatory reaction due to unspecified internal joint prosthesis, initial encounter: Principal | ICD-10-CM | POA: Diagnosis present

## 2011-09-03 HISTORY — PX: I & D KNEE WITH POLY EXCHANGE: SHX5024

## 2011-09-03 HISTORY — DX: Disorder of teeth and supporting structures, unspecified: K08.9

## 2011-09-03 LAB — ABO/RH: ABO/RH(D): A POS

## 2011-09-03 LAB — TYPE AND SCREEN: Antibody Screen: NEGATIVE

## 2011-09-03 LAB — BASIC METABOLIC PANEL
CO2: 24 mEq/L (ref 19–32)
Calcium: 8.7 mg/dL (ref 8.4–10.5)
Creatinine, Ser: 0.94 mg/dL (ref 0.50–1.35)
GFR calc non Af Amer: >90 mL/min (ref >90)
Glucose, Bld: 138 mg/dL — ABNORMAL HIGH (ref 70–99)
Sodium: 123 mEq/L — ABNORMAL LOW (ref 135–145)

## 2011-09-03 LAB — GLUCOSE, CAPILLARY
Glucose-Capillary: 151 mg/dL — ABNORMAL HIGH (ref 70–99)
Glucose-Capillary: 165 mg/dL — ABNORMAL HIGH (ref 70–99)

## 2011-09-03 LAB — PROTIME-INR
INR: 1.25 (ref 0.00–1.49)
Prothrombin Time: 16 seconds — ABNORMAL HIGH (ref 11.6–15.2)

## 2011-09-03 LAB — DIFFERENTIAL
Basophils Absolute: 0 10*3/uL (ref 0.0–0.1)
Basophils Relative: 0 % (ref 0–1)
Eosinophils Absolute: 0 10*3/uL (ref 0.0–0.7)
Lymphocytes Relative: 6 % — ABNORMAL LOW (ref 12–46)
Monocytes Absolute: 1.1 10*3/uL — ABNORMAL HIGH (ref 0.1–1.0)
Neutrophils Relative %: 90 % — ABNORMAL HIGH (ref 43–77)

## 2011-09-03 LAB — CBC
MCH: 28 pg (ref 26.0–34.0)
MCHC: 35.1 g/dL (ref 30.0–36.0)
RDW: 14.7 % (ref 11.5–15.5)

## 2011-09-03 LAB — SURGICAL PCR SCREEN: Staphylococcus aureus: NEGATIVE

## 2011-09-03 SURGERY — IRRIGATION AND DEBRIDEMENT KNEE WITH POLY EXCHANGE
Anesthesia: General | Site: Knee | Laterality: Left | Wound class: Dirty or Infected

## 2011-09-03 MED ORDER — POTASSIUM CHLORIDE IN NACL 20-0.9 MEQ/L-% IV SOLN
INTRAVENOUS | Status: DC
Start: 2011-09-03 — End: 2011-09-06
  Administered 2011-09-03 – 2011-09-04 (×2): via INTRAVENOUS
  Administered 2011-09-04: 125 mL/h via INTRAVENOUS
  Administered 2011-09-05 – 2011-09-06 (×3): via INTRAVENOUS
  Filled 2011-09-03 (×10): qty 1000

## 2011-09-03 MED ORDER — ACETAMINOPHEN 10 MG/ML IV SOLN
INTRAVENOUS | Status: AC
  Filled 2011-09-03: qty 100

## 2011-09-03 MED ORDER — CISATRACURIUM BESYLATE 2 MG/ML IV SOLN
INTRAVENOUS | Status: DC | PRN
  Administered 2011-09-03: 4 mg via INTRAVENOUS

## 2011-09-03 MED ORDER — CEFAZOLIN SODIUM-DEXTROSE 2-3 GM-% IV SOLR
INTRAVENOUS | Status: AC
  Filled 2011-09-03: qty 50

## 2011-09-03 MED ORDER — VANCOMYCIN HCL 1000 MG IV SOLR
1000.0000 mg | INTRAVENOUS | Status: DC | PRN
  Administered 2011-09-03: 1000 mg via INTRAVENOUS

## 2011-09-03 MED ORDER — CEFAZOLIN SODIUM-DEXTROSE 2-3 GM-% IV SOLR
2.0000 g | INTRAVENOUS | Status: AC
  Administered 2011-09-03: 2 g via INTRAVENOUS

## 2011-09-03 MED ORDER — SUCCINYLCHOLINE CHLORIDE 20 MG/ML IJ SOLN
INTRAMUSCULAR | Status: DC | PRN
  Administered 2011-09-03: 140 mg via INTRAVENOUS

## 2011-09-03 MED ORDER — SODIUM CHLORIDE 0.9 % IV SOLN: INTRAVENOUS | Status: DC

## 2011-09-03 MED ORDER — VANCOMYCIN HCL IN DEXTROSE 1-5 GM/200ML-% IV SOLN
INTRAVENOUS | Status: AC
  Filled 2011-09-03: qty 200

## 2011-09-03 MED ORDER — LACTATED RINGERS IV SOLN
INTRAVENOUS | Status: DC | PRN
  Administered 2011-09-03: 20:00:00 via INTRAVENOUS

## 2011-09-03 MED ORDER — SODIUM CHLORIDE 0.9 % IR SOLN
Status: DC | PRN
  Administered 2011-09-03: 9000 mL

## 2011-09-03 MED ORDER — PROPOFOL 10 MG/ML IV BOLUS
INTRAVENOUS | Status: DC | PRN
  Administered 2011-09-03: 200 mg via INTRAVENOUS

## 2011-09-03 MED ORDER — HYDROMORPHONE HCL PF 1 MG/ML IJ SOLN
0.2500 mg | INTRAMUSCULAR | Status: DC | PRN
  Administered 2011-09-03 (×2): 0.5 mg via INTRAVENOUS

## 2011-09-03 MED ORDER — SODIUM CHLORIDE 0.9 % IV SOLN
INTRAVENOUS | Status: DC | PRN
  Administered 2011-09-03 (×2): via INTRAVENOUS

## 2011-09-03 MED ORDER — ACETAMINOPHEN 10 MG/ML IV SOLN
INTRAVENOUS | Status: DC | PRN
  Administered 2011-09-03: 1000 mg via INTRAVENOUS

## 2011-09-03 MED ORDER — INSULIN ASPART 100 UNIT/ML ~~LOC~~ SOLN: 0.0000 [IU] | SUBCUTANEOUS | Status: DC

## 2011-09-03 MED ORDER — VANCOMYCIN HCL 1000 MG IV SOLR
INTRAVENOUS | Status: DC | PRN
  Administered 2011-09-03: 2 g

## 2011-09-03 MED ORDER — METOPROLOL SUCCINATE ER 100 MG PO TB24
200.0000 mg | ORAL_TABLET | Freq: Every day | ORAL | Status: AC
  Administered 2011-09-03: 200 mg via ORAL
  Filled 2011-09-03: qty 2

## 2011-09-03 MED ORDER — TOBRAMYCIN SULFATE 1.2 G IJ SOLR
INTRAMUSCULAR | Status: DC | PRN
  Administered 2011-09-03: 2.4 g

## 2011-09-03 MED ORDER — ONDANSETRON HCL 4 MG/2ML IJ SOLN
INTRAMUSCULAR | Status: DC | PRN
  Administered 2011-09-03: 4 mg via INTRAVENOUS

## 2011-09-03 MED ORDER — FENTANYL CITRATE 0.05 MG/ML IJ SOLN
INTRAMUSCULAR | Status: DC | PRN
  Administered 2011-09-03 (×3): 100 ug via INTRAVENOUS
  Administered 2011-09-03 (×2): 50 ug via INTRAVENOUS
  Administered 2011-09-03: 100 ug via INTRAVENOUS

## 2011-09-03 SURGICAL SUPPLY — 66 items
BAG SPEC THK2 15X12 ZIP CLS (MISCELLANEOUS) ×1
BAG ZIPLOCK 12X15 (MISCELLANEOUS) ×2 IMPLANT
BANDAGE ELASTIC 4 VELCRO ST LF (GAUZE/BANDAGES/DRESSINGS) ×2 IMPLANT
BANDAGE ELASTIC 6 VELCRO ST LF (GAUZE/BANDAGES/DRESSINGS) ×2 IMPLANT
BANDAGE ESMARK 6X9 LF (GAUZE/BANDAGES/DRESSINGS) ×1 IMPLANT
BNDG CMPR 9X6 STRL LF SNTH (GAUZE/BANDAGES/DRESSINGS) ×1
BNDG ESMARK 6X9 LF (GAUZE/BANDAGES/DRESSINGS) ×2
CLOTH BEACON ORANGE TIMEOUT ST (SAFETY) ×2 IMPLANT
CONT SPECI 4OZ STER CLIK (MISCELLANEOUS) ×2 IMPLANT
CUFF TOURN SGL QUICK 34 (TOURNIQUET CUFF) ×2
CUFF TRNQT CYL 34X4X40X1 (TOURNIQUET CUFF) ×1 IMPLANT
DECANTER SPIKE VIAL GLASS SM (MISCELLANEOUS) ×2 IMPLANT
DRAPE EXTREMITY T 121X128X90 (DRAPE) ×2 IMPLANT
DRAPE POUCH INSTRU U-SHP 10X18 (DRAPES) ×2 IMPLANT
DRAPE U-SHAPE 47X51 STRL (DRAPES) ×2 IMPLANT
DRESSING XEROFORM 5X9 (GAUZE/BANDAGES/DRESSINGS) ×1 IMPLANT
DRSG ADAPTIC 3X8 NADH LF (GAUZE/BANDAGES/DRESSINGS) ×2 IMPLANT
DRSG PAD ABDOMINAL 8X10 ST (GAUZE/BANDAGES/DRESSINGS) ×3 IMPLANT
DURAPREP 26ML APPLICATOR (WOUND CARE) ×2 IMPLANT
ELECT REM PT RETURN 9FT ADLT (ELECTROSURGICAL) ×2
ELECTRODE REM PT RTRN 9FT ADLT (ELECTROSURGICAL) ×1 IMPLANT
EVACUATOR 1/8 PVC DRAIN (DRAIN) ×2 IMPLANT
FACESHIELD LNG OPTICON STERILE (SAFETY) ×10 IMPLANT
FLOSEAL (HEMOSTASIS) ×2 IMPLANT
GAUZE KERLIX 2  STERILE LF (GAUZE/BANDAGES/DRESSINGS) ×1 IMPLANT
GAUZE SPONGE 4X4 12PLY STRL LF (GAUZE/BANDAGES/DRESSINGS) ×1 IMPLANT
GLOVE BIOGEL PI IND STRL 7.5 (GLOVE) ×1 IMPLANT
GLOVE BIOGEL PI IND STRL 8 (GLOVE) ×1 IMPLANT
GLOVE BIOGEL PI INDICATOR 7.5 (GLOVE) ×1
GLOVE BIOGEL PI INDICATOR 8 (GLOVE) ×1
GLOVE ECLIPSE 8.0 STRL XLNG CF (GLOVE) IMPLANT
GLOVE ORTHO TXT STRL SZ7.5 (GLOVE) ×4 IMPLANT
GLOVE SURG ORTHO 8.0 STRL STRW (GLOVE) ×2 IMPLANT
GOWN BRE IMP PREV XXLGXLNG (GOWN DISPOSABLE) ×2 IMPLANT
GOWN STRL NON-REIN LRG LVL3 (GOWN DISPOSABLE) ×2 IMPLANT
HANDPIECE INTERPULSE COAX TIP (DISPOSABLE) ×2
IMMOBILIZER KNEE 20 (SOFTGOODS)
IMMOBILIZER KNEE 20 THIGH 36 (SOFTGOODS) IMPLANT
KIT BASIN OR (CUSTOM PROCEDURE TRAY) ×2 IMPLANT
KIT STIMULAN RAPID CURE  10CC (Orthopedic Implant) ×2 IMPLANT
KIT STIMULAN RAPID CURE 10CC (Orthopedic Implant) IMPLANT
MANIFOLD NEPTUNE II (INSTRUMENTS) ×2 IMPLANT
NDL SAFETY ECLIPSE 18X1.5 (NEEDLE) ×1 IMPLANT
NEEDLE HYPO 18GX1.5 SHARP (NEEDLE) ×2
NS IRRIG 1000ML POUR BTL (IV SOLUTION) ×4 IMPLANT
PACK TOTAL JOINT (CUSTOM PROCEDURE TRAY) ×2 IMPLANT
PADDING CAST COTTON 6X4 STRL (CAST SUPPLIES) ×4 IMPLANT
PLATE ROT INSERT 12.5MM SIZE 4 (Plate) ×1 IMPLANT
POSITIONER SURGICAL ARM (MISCELLANEOUS) ×2 IMPLANT
SET HNDPC FAN SPRY TIP SCT (DISPOSABLE) ×1 IMPLANT
SPONGE GAUZE 4X4 12PLY (GAUZE/BANDAGES/DRESSINGS) ×4 IMPLANT
SPONGE LAP 18X18 X RAY DECT (DISPOSABLE) ×2 IMPLANT
STRIP CLOSURE SKIN 1/2X4 (GAUZE/BANDAGES/DRESSINGS) ×4 IMPLANT
SUCTION FRAZIER 12FR DISP (SUCTIONS) ×2 IMPLANT
SUT MNCRL AB 4-0 PS2 18 (SUTURE) ×2 IMPLANT
SUT VIC AB 1 CT1 36 (SUTURE) ×6 IMPLANT
SUT VIC AB 2-0 CT1 27 (SUTURE) ×6
SUT VIC AB 2-0 CT1 TAPERPNT 27 (SUTURE) ×3 IMPLANT
SWAB COLLECTION DEVICE MRSA (MISCELLANEOUS) ×2 IMPLANT
SYR 50ML LL SCALE MARK (SYRINGE) ×2 IMPLANT
SYR CONTROL 10ML LL (SYRINGE) ×2 IMPLANT
TOWEL OR 17X26 10 PK STRL BLUE (TOWEL DISPOSABLE) ×4 IMPLANT
TRAY FOLEY CATH 14FRSI W/METER (CATHETERS) ×2 IMPLANT
TUBE ANAEROBIC SPECIMEN COL (MISCELLANEOUS) ×2 IMPLANT
WATER STERILE IRR 1500ML POUR (IV SOLUTION) ×2 IMPLANT
WRAP KNEE MAXI GEL POST OP (GAUZE/BANDAGES/DRESSINGS) ×2 IMPLANT

## 2011-09-03 NOTE — H&P (Signed)
Brent Ortiz is an 51 y.o. male.    Chief Complaint: left knee pain and swelling  HPI: Pt is a 51 y.o. male complaining of left knee pain and swelling for 4 days.  Pt had a previous left TKA in March 2012 and a subsequent revision in December of 2012. Pt has been doing well since the revision until suddenly having pain in the left knee on Sunday.  It has been progressively increasing since that point. He returned to the clinic where the knee was aspirated, revealing questionable infection of the left knee joint fluid. Various options are discussed with the patient. Risks, benefits and expectations were discussed with the patient. Patient understand the risks, benefits and expectations and wishes to proceed with surgery.   PCP:  Dr. Perry  D/C Plans:  Home with HHPT  Post-op Meds:  No Rx given - Pt has been off of Effient for a week since his doctor was changing him to Coumadin. He will go on Coumadin with branching Lovenox after surgery.  Tranexamic Acid:  Not to be given  Decadron:  Not to be given  PMH: Past Medical History  Diagnosis Date  . HLD (hyperlipidemia)   . Gout   . Osteoarthritis   . HTN (hypertension)   . Coronary artery disease   . Myocardial infarction     10 -02-2011  . Diabetes mellitus     niddm    PSH: Past Surgical History  Procedure Date  . Total knee arthroplasty  right  feb 2012    left march 2012 r  . Hernia repair 2009  . Total knee revision 06/23/2011    Procedure: TOTAL KNEE REVISION;  Surgeon: Shelda Pal;  Location: [facility] ORS;  Service: Orthopedics;  Laterality: Left;  femomal nerve block done in holding area without incident    Social History:  reports that he quit smoking about 4 months ago. His smoking use included Cigarettes. He has a 37 pack-year smoking history. He has never used smokeless tobacco. He reports that he does not drink alcohol or use illicit drugs.  Allergies:  Allergies  Allergen Reactions  . Motrin (Ibuprofen) Rash    Stomach irritation    Medications: No current facility-administered medications for this encounter.   Current Outpatient Prescriptions  Medication Sig Dispense Refill  . amLODipine (NORVASC) 2.5 MG tablet Take 2.5 mg by mouth every morning.       Marland Kitchen aspirin EC 81 MG tablet Take 1 tablet (81 mg total) by mouth daily.      . ferrous sulfate 325 (65 FE) MG tablet Take 1 tablet (325 mg total) by mouth 3 (three) times daily after meals.      Marland Kitchen lisinopril (PRINIVIL,ZESTRIL) 10 MG tablet Take 10 mg by mouth every morning.       . metoprolol (TOPROL-XL) 200 MG 24 hr tablet Take 200 mg by mouth every morning.       . nitroGLYCERIN (NITROSTAT) 0.4 MG SL tablet Place 0.4 mg under the tongue every 5 (five) minutes as needed. For chest pain      . pantoprazole (PROTONIX) 40 MG tablet Take 40 mg by mouth every morning.       . prasugrel (EFFIENT) 10 MG TABS Take 10 mg by mouth every evening.       . rosuvastatin (CRESTOR) 20 MG tablet Take 20 mg by mouth daily.       . sitaGLIPtin (JANUVIA) 100 MG tablet Take 100 mg by mouth every morning.  ROS: Review of Systems  Constitutional: Negative.   HENT: Negative.   Eyes: Negative.   Respiratory: Negative.   Cardiovascular: Negative.   Gastrointestinal: Negative.   Genitourinary: Negative.   Musculoskeletal: Positive for joint pain (left knee).  Skin: Negative.   Neurological: Negative.   Endo/Heme/Allergies: Negative.   Psychiatric/Behavioral: Negative.      Physican Exam: TEMP: 99.4 Physical Exam  Constitutional: He is oriented to person, place, and time and well-developed, well-nourished, and in no distress.  HENT:  Head: Normocephalic and atraumatic.  Nose: Nose normal.  Mouth/Throat: Oropharynx is clear and moist.  Eyes: Pupils are equal, round, and reactive to light.  Neck: Neck supple. No JVD present. No tracheal deviation present. No thyromegaly present.  Cardiovascular: Normal heart sounds.   Pulmonary/Chest: Effort normal  and breath sounds normal. No stridor. No respiratory distress. He has no wheezes. He has no rales. He exhibits no tenderness.  Abdominal: Soft. There is no tenderness. There is no guarding.  Musculoskeletal:       Left knee: He exhibits decreased range of motion, swelling, effusion, laceration (well healed previous incision) and bony tenderness. He exhibits no ecchymosis, no deformity, no erythema and normal alignment. tenderness found.  Lymphadenopathy:    He has no cervical adenopathy.  Neurological: He is alert and oriented to person, place, and time.  Skin: Skin is warm and dry.  Psychiatric: Affect normal.     Assessment/Plan Assessment: left knee pain and swelling  Plan: Patient will undergo a left knee I&D and poly exchange with Stimulan antibiotic system on 09/03/2011. Risks benefits and expectation were discussed with the patient. Patient understand risks, benefits and expectation and wishes to proceed.   [name] Lynton Crescenzo   PAC  09/03/2011, 4:55 PM

## 2011-09-03 NOTE — Anesthesia Postprocedure Evaluation (Signed)
  Anesthesia Post-op Note  Patient: Brent Ortiz  Procedure(s) Performed: Procedure(s) (LRB): IRRIGATION AND DEBRIDEMENT KNEE WITH POLY EXCHANGE (Left)  Patient Location: PACU  Anesthesia Type: General  Level of Consciousness: oriented and sedated  Airway and Oxygen Therapy: Patient Spontanous Breathing and aerosol face mask  Post-op Pain: mild  Post-op Assessment: Post-op Vital signs reviewed, Patient's Cardiovascular Status Stable, Respiratory Function Stable and Patent Airway  Post-op Vital Signs: stable  Complications: No apparent anesthesia complications

## 2011-09-03 NOTE — Brief Op Note (Signed)
09/03/2011  10:33 PM  PATIENT:  Brent Ortiz  51 y.o. male  PRE-OPERATIVE DIAGNOSIS:  infected left total knee replacement  POST-OPERATIVE DIAGNOSIS:  infected left total knee replacement  PROCEDURE:  Procedure(s) (LRB): IRRIGATION AND DEBRIDEMENT KNEE WITH POLY EXCHANGE (Left)  SURGEON:  Surgeon(s) and Role:    * Shelda Pal, MD - Primary  PHYSICIAN ASSISTANT: Avel Peace, PA-C   ANESTHESIA:   general  EBL:  Total I/O In: 2600 [I.V.:2600] Out: 250 [Urine:250]  BLOOD ADMINISTERED:none  DRAINS: (1 medium) Hemovact drain(s) in the left knee with  Suction Open   LOCAL MEDICATIONS USED:  NONE  SPECIMEN:  Source of Specimen:  left knee fluid  DISPOSITION OF SPECIMEN:  micro lab  COUNTS:  YES  TOURNIQUET:   Total Tourniquet Time Documented: Thigh (Left) - 84 minutes  DICTATION: .Other Dictation: Dictation Number [id number]  PLAN OF CARE: Admit to inpatient   PATIENT DISPOSITION:  PACU - hemodynamically stable.   Delay start of Pharmacological VTE agent (>24hrs) due to surgical blood loss or risk of bleeding: no

## 2011-09-03 NOTE — Interval H&P Note (Signed)
History and Physical Interval Note:  09/03/2011 8:18 PM  Brent Ortiz  has presented today for surgery, with the diagnosis of infected left knee  The various methods of treatment have been discussed with the patient and family. After consideration of risks, benefits and other options for treatment, the patient has consented to  Procedure(s) (LRB): LEFT IRRIGATION AND DEBRIDEMENT KNEE WITH POLY EXCHANGE (Left) as a surgical intervention .  The patients' history has been reviewed, patient examined, no change in status, stable for surgery.  I have reviewed the patients' chart and labs.  Questions were answered to the patient's satisfaction.     Shelda Pal

## 2011-09-03 NOTE — Transfer of Care (Signed)
Immediate Anesthesia Transfer of Care Note  Patient: Brent Ortiz  Procedure(s) Performed: Procedure(s) (LRB): IRRIGATION AND DEBRIDEMENT KNEE WITH POLY EXCHANGE (Left)  Patient Location: PACU  Anesthesia Type: General  Level of Consciousness: awake, alert , oriented and patient cooperative  Airway & Oxygen Therapy: Patient Spontanous Breathing and Patient connected to face mask oxygen  Post-op Assessment: Report given to PACU RN and Post -op Vital signs reviewed and stable  Post vital signs: Reviewed and stable  Complications: No apparent anesthesia complications

## 2011-09-03 NOTE — Anesthesia Preprocedure Evaluation (Signed)
Anesthesia Evaluation  Patient identified by MRN, date of birth, ID band Patient awake  General Assessment Comment:Ate 14:00  Reviewed: Allergy & Precautions, H&P , NPO status , Patient's Chart, lab work & pertinent test results, reviewed documented beta blocker date and time   Airway Mallampati: III TM Distance: >3 FB Neck ROM: Full    Dental  (+) Poor Dentition, Upper Dentures and Lower Dentures   Pulmonary pneumonia ,  LLL pneumonia, pt denies fever, productive cough or SOB clear to auscultation  + decreased breath sounds      Cardiovascular hypertension, Pt. on medications + CAD and + Past MI Regular Normal CAD, s/p MI 9 months ago Currently asymptomatic   Neuro/Psych Negative Neurological ROS  Negative Psych ROS   GI/Hepatic negative GI ROS, Neg liver ROS,   Endo/Other  Diabetes mellitus-, Type 2, Oral Hypoglycemic AgentsMorbid obesity  Renal/GU negative Renal ROS  Genitourinary negative   Musculoskeletal negative musculoskeletal ROS (+)   Abdominal   Peds negative pediatric ROS (+)  Hematology Anemia Hgb 10.6   Anesthesia Other Findings   Reproductive/Obstetrics negative OB ROS                           Anesthesia Physical Anesthesia Plan  ASA: III  Anesthesia Plan: General   Post-op Pain Management:    Induction: Intravenous, Rapid sequence and Cricoid pressure planned  Airway Management Planned: Oral ETT  Additional Equipment:   Intra-op Plan:   Post-operative Plan: Extubation in OR  Informed Consent: I have reviewed the patients History and Physical, chart, labs and discussed the procedure including the risks, benefits and alternatives for the proposed anesthesia with the patient or authorized representative who has indicated his/her understanding and acceptance.     Plan Discussed with: Surgeon and CRNA  Anesthesia Plan Comments:         Anesthesia Quick  Evaluation

## 2011-09-03 NOTE — Preoperative (Signed)
Beta Blockers   Reason not to administer Beta Blockers:Not Applicable 

## 2011-09-04 ENCOUNTER — Encounter (HOSPITAL_COMMUNITY): Payer: Self-pay | Admitting: Internal Medicine

## 2011-09-04 ENCOUNTER — Inpatient Hospital Stay (HOSPITAL_COMMUNITY): Payer: Medicaid Other

## 2011-09-04 DIAGNOSIS — D72829 Elevated white blood cell count, unspecified: Secondary | ICD-10-CM

## 2011-09-04 DIAGNOSIS — J189 Pneumonia, unspecified organism: Secondary | ICD-10-CM

## 2011-09-04 DIAGNOSIS — T8450XA Infection and inflammatory reaction due to unspecified internal joint prosthesis, initial encounter: Secondary | ICD-10-CM

## 2011-09-04 DIAGNOSIS — Y831 Surgical operation with implant of artificial internal device as the cause of abnormal reaction of the patient, or of later complication, without mention of misadventure at the time of the procedure: Secondary | ICD-10-CM

## 2011-09-04 DIAGNOSIS — T8459XA Infection and inflammatory reaction due to other internal joint prosthesis, initial encounter: Secondary | ICD-10-CM

## 2011-09-04 DIAGNOSIS — K089 Disorder of teeth and supporting structures, unspecified: Secondary | ICD-10-CM | POA: Insufficient documentation

## 2011-09-04 DIAGNOSIS — E871 Hypo-osmolality and hyponatremia: Secondary | ICD-10-CM

## 2011-09-04 DIAGNOSIS — T8454XA Infection and inflammatory reaction due to internal left knee prosthesis, initial encounter: Secondary | ICD-10-CM

## 2011-09-04 HISTORY — DX: Pneumonia, unspecified organism: J18.9

## 2011-09-04 HISTORY — DX: Elevated white blood cell count, unspecified: D72.829

## 2011-09-04 LAB — BASIC METABOLIC PANEL
CO2: 27 mEq/L (ref 19–32)
Calcium: 8.3 mg/dL — ABNORMAL LOW (ref 8.4–10.5)
Creatinine, Ser: 0.79 mg/dL (ref 0.50–1.35)
Glucose, Bld: 149 mg/dL — ABNORMAL HIGH (ref 70–99)

## 2011-09-04 LAB — URINE CULTURE
Colony Count: NO GROWTH
Culture: NO GROWTH

## 2011-09-04 LAB — CBC
MCH: 28.3 pg (ref 26.0–34.0)
MCV: 80.9 fL (ref 78.0–100.0)
Platelets: 481 10*3/uL — ABNORMAL HIGH (ref 150–400)
RBC: 3.46 MIL/uL — ABNORMAL LOW (ref 4.22–5.81)

## 2011-09-04 LAB — URINALYSIS, ROUTINE W REFLEX MICROSCOPIC
Bilirubin Urine: NEGATIVE
Glucose, UA: NEGATIVE mg/dL
Ketones, ur: NEGATIVE mg/dL
pH: 6 (ref 5.0–8.0)

## 2011-09-04 LAB — PROTIME-INR
INR: 1.2 (ref 0.00–1.49)
Prothrombin Time: 15.5 seconds — ABNORMAL HIGH (ref 11.6–15.2)

## 2011-09-04 LAB — GLUCOSE, CAPILLARY
Glucose-Capillary: 189 mg/dL — ABNORMAL HIGH (ref 70–99)
Glucose-Capillary: 95 mg/dL (ref 70–99)
Glucose-Capillary: 153 mg/dL — ABNORMAL HIGH (ref 70–99)

## 2011-09-04 LAB — RAPID STREP SCREEN (MED CTR MEBANE ONLY): Streptococcus, Group A Screen (Direct): NEGATIVE

## 2011-09-04 LAB — URINE MICROSCOPIC-ADD ON

## 2011-09-04 MED ORDER — ACETAMINOPHEN 325 MG PO TABS: 650.0000 mg | ORAL_TABLET | Freq: Four times a day (QID) | ORAL | Status: DC | PRN

## 2011-09-04 MED ORDER — METOCLOPRAMIDE HCL 5 MG/ML IJ SOLN
5.0000 mg | Freq: Three times a day (TID) | INTRAMUSCULAR | Status: DC | PRN
  Filled 2011-09-04: qty 2

## 2011-09-04 MED ORDER — FLEET ENEMA 7-19 GM/118ML RE ENEM: 1.0000 | ENEMA | Freq: Once | RECTAL | Status: AC | PRN

## 2011-09-04 MED ORDER — AMLODIPINE BESYLATE 2.5 MG PO TABS
2.5000 mg | ORAL_TABLET | Freq: Every day | ORAL | Status: DC
  Administered 2011-09-04: 2.5 mg via ORAL
  Filled 2011-09-04: qty 1

## 2011-09-04 MED ORDER — LINAGLIPTIN 5 MG PO TABS
5.0000 mg | ORAL_TABLET | Freq: Every day | ORAL | Status: DC
  Administered 2011-09-04 – 2011-09-07 (×4): 5 mg via ORAL
  Filled 2011-09-04 (×5): qty 1

## 2011-09-04 MED ORDER — LACTATED RINGERS IV SOLN: INTRAVENOUS | Status: DC

## 2011-09-04 MED ORDER — WARFARIN - PHARMACIST DOSING INPATIENT
Freq: Every day | Status: DC
  Filled 2011-09-04 (×6): qty 1

## 2011-09-04 MED ORDER — OXYCODONE HCL 5 MG PO TABS
5.0000 mg | ORAL_TABLET | ORAL | Status: DC | PRN
  Administered 2011-09-04 (×2): 10 mg via ORAL
  Filled 2011-09-04 (×2): qty 2

## 2011-09-04 MED ORDER — FERROUS SULFATE 325 (65 FE) MG PO TABS
325.0000 mg | ORAL_TABLET | Freq: Three times a day (TID) | ORAL | Status: DC
  Administered 2011-09-04 – 2011-09-07 (×12): 325 mg via ORAL
  Filled 2011-09-04 (×15): qty 1

## 2011-09-04 MED ORDER — PHENOL 1.4 % MT LIQD: 1.0000 | OROMUCOSAL | Status: DC | PRN

## 2011-09-04 MED ORDER — PANTOPRAZOLE SODIUM 40 MG PO TBEC
40.0000 mg | DELAYED_RELEASE_TABLET | Freq: Every day | ORAL | Status: DC
  Administered 2011-09-04 – 2011-09-07 (×4): 40 mg via ORAL
  Filled 2011-09-04 (×5): qty 1

## 2011-09-04 MED ORDER — METOCLOPRAMIDE HCL 10 MG PO TABS: 5.0000 mg | ORAL_TABLET | Freq: Three times a day (TID) | ORAL | Status: DC | PRN

## 2011-09-04 MED ORDER — VANCOMYCIN HCL 1000 MG IV SOLR
1250.0000 mg | Freq: Two times a day (BID) | INTRAVENOUS | Status: DC
  Administered 2011-09-04 – 2011-09-06 (×5): 1250 mg via INTRAVENOUS
  Filled 2011-09-04 (×6): qty 1250

## 2011-09-04 MED ORDER — POLYETHYLENE GLYCOL 3350 17 G PO PACK
17.0000 g | PACK | Freq: Two times a day (BID) | ORAL | Status: DC
  Administered 2011-09-04 – 2011-09-06 (×4): 17 g via ORAL
  Filled 2011-09-04 (×10): qty 1

## 2011-09-04 MED ORDER — OXYCODONE HCL 5 MG PO TABS
5.0000 mg | ORAL_TABLET | ORAL | Status: DC | PRN
  Administered 2011-09-04 – 2011-09-07 (×15): 10 mg via ORAL
  Filled 2011-09-04 (×15): qty 2

## 2011-09-04 MED ORDER — SODIUM CHLORIDE 0.9 % IJ SOLN: 10.0000 mL | Freq: Two times a day (BID) | INTRAMUSCULAR | Status: DC

## 2011-09-04 MED ORDER — POTASSIUM CHLORIDE CRYS ER 20 MEQ PO TBCR
40.0000 meq | EXTENDED_RELEASE_TABLET | Freq: Once | ORAL | Status: AC
  Administered 2011-09-04: 40 meq via ORAL
  Filled 2011-09-04: qty 2

## 2011-09-04 MED ORDER — ONDANSETRON HCL 4 MG PO TABS
4.0000 mg | ORAL_TABLET | Freq: Four times a day (QID) | ORAL | Status: DC | PRN
  Filled 2011-09-04: qty 1

## 2011-09-04 MED ORDER — NITROGLYCERIN 0.4 MG SL SUBL: 0.4000 mg | SUBLINGUAL_TABLET | SUBLINGUAL | Status: DC | PRN

## 2011-09-04 MED ORDER — METOPROLOL SUCCINATE ER 100 MG PO TB24
200.0000 mg | ORAL_TABLET | Freq: Every day | ORAL | Status: DC
  Administered 2011-09-04 – 2011-09-07 (×4): 200 mg via ORAL
  Filled 2011-09-04 (×5): qty 2

## 2011-09-04 MED ORDER — HYDROMORPHONE HCL PF 1 MG/ML IJ SOLN
0.5000 mg | INTRAMUSCULAR | Status: DC | PRN
  Administered 2011-09-04 (×2): 1 mg via INTRAVENOUS
  Filled 2011-09-04 (×2): qty 1

## 2011-09-04 MED ORDER — METHOCARBAMOL 500 MG PO TABS
500.0000 mg | ORAL_TABLET | Freq: Four times a day (QID) | ORAL | Status: DC | PRN
  Administered 2011-09-04 – 2011-09-07 (×9): 500 mg via ORAL
  Filled 2011-09-04 (×10): qty 1

## 2011-09-04 MED ORDER — DOCUSATE SODIUM 100 MG PO CAPS
100.0000 mg | ORAL_CAPSULE | Freq: Two times a day (BID) | ORAL | Status: DC
  Administered 2011-09-04 – 2011-09-07 (×6): 100 mg via ORAL
  Filled 2011-09-04 (×10): qty 1

## 2011-09-04 MED ORDER — RIFAMPIN 300 MG PO CAPS
600.0000 mg | ORAL_CAPSULE | Freq: Two times a day (BID) | ORAL | Status: DC
Start: 2011-09-04 — End: 2011-09-04
  Administered 2011-09-04 (×2): 600 mg via ORAL
  Filled 2011-09-04 (×3): qty 2

## 2011-09-04 MED ORDER — ATORVASTATIN CALCIUM 10 MG PO TABS
20.0000 mg | ORAL_TABLET | Freq: Every day | ORAL | Status: DC
  Administered 2011-09-04 – 2011-09-07 (×4): 20 mg via ORAL
  Filled 2011-09-04 (×5): qty 2

## 2011-09-04 MED ORDER — INSULIN ASPART 100 UNIT/ML ~~LOC~~ SOLN
0.0000 [IU] | Freq: Three times a day (TID) | SUBCUTANEOUS | Status: DC
  Administered 2011-09-04: 4 [IU] via SUBCUTANEOUS
  Administered 2011-09-05: 3 [IU] via SUBCUTANEOUS
  Administered 2011-09-06: 4 [IU] via SUBCUTANEOUS
  Administered 2011-09-06: 15 [IU] via SUBCUTANEOUS
  Administered 2011-09-07: 3 [IU] via SUBCUTANEOUS
  Filled 2011-09-04: qty 3

## 2011-09-04 MED ORDER — ACETAMINOPHEN 650 MG RE SUPP: 650.0000 mg | Freq: Four times a day (QID) | RECTAL | Status: DC | PRN

## 2011-09-04 MED ORDER — IOHEXOL 300 MG/ML  SOLN
100.0000 mL | Freq: Once | INTRAMUSCULAR | Status: AC | PRN
  Administered 2011-09-04: 80 mL via INTRAVENOUS

## 2011-09-04 MED ORDER — ASPIRIN EC 81 MG PO TBEC
81.0000 mg | DELAYED_RELEASE_TABLET | Freq: Every day | ORAL | Status: DC
  Administered 2011-09-04 – 2011-09-07 (×4): 81 mg via ORAL
  Filled 2011-09-04 (×5): qty 1

## 2011-09-04 MED ORDER — ONDANSETRON HCL 4 MG/2ML IJ SOLN
4.0000 mg | Freq: Four times a day (QID) | INTRAMUSCULAR | Status: DC | PRN
  Filled 2011-09-04: qty 2

## 2011-09-04 MED ORDER — FERROUS SULFATE 325 (65 FE) MG PO TABS: 325.0000 mg | ORAL_TABLET | Freq: Three times a day (TID) | ORAL | Status: DC

## 2011-09-04 MED ORDER — METHOCARBAMOL 100 MG/ML IJ SOLN
500.0000 mg | Freq: Four times a day (QID) | INTRAVENOUS | Status: DC | PRN
  Filled 2011-09-04: qty 5

## 2011-09-04 MED ORDER — MENTHOL 3 MG MT LOZG: 1.0000 | LOZENGE | OROMUCOSAL | Status: DC | PRN

## 2011-09-04 MED ORDER — DEXTROSE 5 % IV SOLN
1.0000 g | Freq: Two times a day (BID) | INTRAVENOUS | Status: DC
  Administered 2011-09-04 – 2011-09-07 (×8): 1 g via INTRAVENOUS
  Filled 2011-09-04 (×10): qty 1

## 2011-09-04 MED ORDER — LISINOPRIL 10 MG PO TABS
10.0000 mg | ORAL_TABLET | Freq: Every day | ORAL | Status: DC
  Administered 2011-09-04: 10 mg via ORAL
  Filled 2011-09-04: qty 1

## 2011-09-04 MED ORDER — SODIUM CHLORIDE 0.9 % IJ SOLN
10.0000 mL | INTRAMUSCULAR | Status: DC | PRN
  Administered 2011-09-05 – 2011-09-07 (×3): 10 mL

## 2011-09-04 MED ORDER — ACETAMINOPHEN 500 MG PO TABS
1000.0000 mg | ORAL_TABLET | Freq: Three times a day (TID) | ORAL | Status: DC
  Administered 2011-09-04 – 2011-09-07 (×11): 1000 mg via ORAL
  Filled 2011-09-04 (×17): qty 2

## 2011-09-04 MED ORDER — WARFARIN SODIUM 10 MG PO TABS
10.0000 mg | ORAL_TABLET | Freq: Once | ORAL | Status: AC
  Administered 2011-09-04: 10 mg via ORAL
  Filled 2011-09-04: qty 1

## 2011-09-04 MED ORDER — PIPERACILLIN-TAZOBACTAM 3.375 G IVPB
3.3750 g | Freq: Three times a day (TID) | INTRAVENOUS | Status: DC
  Administered 2011-09-04 (×2): 3.375 g via INTRAVENOUS
  Filled 2011-09-04 (×5): qty 50

## 2011-09-04 NOTE — Progress Notes (Signed)
ANTIBIOTIC/Anticoagulation CONSULT NOTE - INITIAL  Pharmacy Consult for Vancomycin/Zosyn/Warfarin Indication: Infected knee/R/O PNA/ VTE prophylaxis  Allergies  Allergen Reactions  . Motrin (Ibuprofen) Rash    Stomach irritation    Patient Measurements: Height: 5\' 8"  (172.7 cm) Weight: 302 lb (136.986 kg) IBW/kg (Calculated) : 68.4    Vital Signs: Temp: 97.7 F (36.5 C) (03/01 0021) Temp src: Oral (02/28 1810) BP: 110/74 mmHg (03/01 0021) Pulse Rate: 81  (03/01 0021) Intake/Output from previous day: 02/28 0701 - 03/01 0700 In: 2925 [I.V.:2925] Out: 500 [Urine:450; Drains:50] Intake/Output from this shift: Total I/O In: 2925 [I.V.:2925] Out: 500 [Urine:450; Drains:50]  Labs:  [facility] 09/03/11 1800  WBC 26.4*  HGB 10.6*  PLT 538*  LABCREA --  CREATININE 0.94   Estimated Creatinine Clearance: 123.1 ml/min (by C-G formula based on Cr of 0.94). No results found for this basename: VANCOTROUGH:2,VANCOPEAK:2,VANCORANDOM:2,GENTTROUGH:2,GENTPEAK:2,GENTRANDOM:2,TOBRATROUGH:2,TOBRAPEAK:2,TOBRARND:2,AMIKACINPEAK:2,AMIKACINTROU:2,AMIKACIN:2, in the last 72 hours   Microbiology: Recent Results (from the past 720 hour(s))  SURGICAL PCR SCREEN     Status: Normal   Collection Time   09/03/11  5:53 PM      Component Value Range Status Comment   MRSA, PCR NEGATIVE  NEGATIVE  Final    Staphylococcus aureus NEGATIVE  NEGATIVE  Final   GRAM STAIN     Status: Normal   Collection Time   09/03/11  6:33 PM      Component Value Range Status Comment   Specimen Description LEG   Final    Special Requests NONE   Final    Gram Stain     Final    Value: ABUNDANT GRAM NEGATIVE RODS     RARE WBC PRESENT, PREDOMINANTLY PMN     RESULT READ BACK AND VERIFIED WITH B Everitt RN/PACU AT 2230 09/03/2011 BY R LINEBERRY   Report Status 09/03/2011 FINAL   Final     Medical History: Past Medical History  Diagnosis Date  . HLD (hyperlipidemia)   . Gout   . Osteoarthritis   . HTN (hypertension)    . Coronary artery disease   . Myocardial infarction     04-13-2011  . Diabetes mellitus     niddm    Medications:  Scheduled:    . acetaminophen  1,000 mg Oral Q8H  . amLODipine  2.5 mg Oral BH-q7a  . aspirin EC  81 mg Oral Daily  . atorvastatin  20 mg Oral q1800  .  ceFAZolin (ANCEF) IV  2 g Intravenous 60 min Pre-Op  . docusate sodium  100 mg Oral BID  . ferrous sulfate  325 mg Oral TID PC  . ferrous sulfate  325 mg Oral TID PC  . insulin aspart  0-20 Units Subcutaneous TID WC  . linagliptin  5 mg Oral Daily  . lisinopril  10 mg Oral BH-q7a  . metoprolol succinate  200 mg Oral Daily  . metoprolol  200 mg Oral BH-q7a  . pantoprazole  40 mg Oral BH-q7a  . piperacillin-tazobactam (ZOSYN)  IV  3.375 g Intravenous Q8H  . polyethylene glycol  17 g Oral BID  . rifampin  600 mg Oral Q12H  . DISCONTD: insulin aspart  0-15 Units Subcutaneous Q4H   Infusions:    . 0.9 % NaCl with KCl 20 mEq / L 125 mL/hr at 09/03/11 2252  . DISCONTD: sodium chloride    . DISCONTD: lactated ringers     Assessment: 51 yo male s/p TKA 3/12 with revision 12/12 now with infected knee.  Goal of Therapy:  Vancomycin  trough level 15-20 mcg/ml INR 2-3  Plan:   Zosyn 3.375 Gm IV q8h (EI infusion)  Vancomycin 1250mg  IV q12h.  CrCl~96 ml/min (N)  Coumadin 10mg  x1 .   Daily PT/INR  F/U SCr/levels as needed.  Susanne Greenhouse R 09/04/2011,1:16 AM

## 2011-09-04 NOTE — Progress Notes (Signed)
PT Cancellation Note  ___Treatment cancelled today due to medical issues with patient which prohibited   therapy  ___ Treatment cancelled today due to patient receiving procedure or test   ___ Treatment cancelled today due to patient's refusal to participate   _x__ Treatment cancelled today due to pt has to go to CT scan, currently having PICC placed. Will eval in am.

## 2011-09-04 NOTE — Progress Notes (Signed)
ANTICOAGULATION CONSULT NOTE - Follow Up Consult  Pharmacy Consult for Warfarin Indication: VTE prophylaxis  Allergies  Allergen Reactions  . Motrin (Ibuprofen) Rash    Stomach irritation    Patient Measurements: Height: 5\' 8"  (172.7 cm) Weight: 302 lb (136.986 kg) IBW/kg (Calculated) : 68.4    Vital Signs: Temp: 97.5 F (36.4 C) (03/01 0600) Temp src: Oral (03/01 0600) BP: 114/74 mmHg (03/01 0929) Pulse Rate: 85  (03/01 0928)  Labs:  Basename 09/04/11 0405 09/03/11 1800  HGB 9.8* 10.6*  HCT 28.0* 30.2*  PLT 481* 538*  APTT -- 38*  LABPROT 15.5* 16.0*  INR 1.20 1.25  HEPARINUNFRC -- --  CREATININE 0.79 0.94  CKTOTAL -- --  CKMB -- --  TROPONINI -- --   Estimated Creatinine Clearance: 144.7 ml/min (by C-G formula based on Cr of 0.79).   Medications:  Scheduled:    . acetaminophen  1,000 mg Oral Q8H  . amLODipine  2.5 mg Oral Daily  . aspirin EC  81 mg Oral Daily  . atorvastatin  20 mg Oral q1800  .  ceFAZolin (ANCEF) IV  2 g Intravenous 60 min Pre-Op  . docusate sodium  100 mg Oral BID  . ferrous sulfate  325 mg Oral TID PC  . insulin aspart  0-20 Units Subcutaneous TID WC  . linagliptin  5 mg Oral Daily  . lisinopril  10 mg Oral Daily  . metoprolol succinate  200 mg Oral Daily  . metoprolol  200 mg Oral Daily  . pantoprazole  40 mg Oral Q1200  . piperacillin-tazobactam (ZOSYN)  IV  3.375 g Intravenous Q8H  . polyethylene glycol  17 g Oral BID  . rifampin  600 mg Oral Q12H  . vancomycin  1,250 mg Intravenous Q12H  . warfarin  10 mg Oral Once  . DISCONTD: ferrous sulfate  325 mg Oral TID PC  . DISCONTD: insulin aspart  0-15 Units Subcutaneous Q4H    Assessment:  51 YOM s/p TKA 09/2010, with revision 06/2011 now with infected knee s/p I&D with exchange 09/03/11  Warfarin 10mg  started this am for VTE prophylaxis  Of note, patient stopped taking Effient (prasugrel) 1 week post op which he states he started about 9 months ago s/p MI.  He thinks the  plan is to no longer take this due to cost, and continue warfarin indefinitely.  I strongly encouraged him to confirm this plan with his physician.  Drug-drug interaction with rifampin present: rifampin will induce the metabolism of warfarin, likely increasing coumadin requirements   Goal of Therapy:  INR 2-3   Plan:   No further warfarin today  Monitor closely for drug-drug interaction with rifampin  Educated patient regarding warfarin indication, adverse effects, importance of INR monitoring, drug-drug and drug-food interactions.  He demonstrated appropriate understanding.  Will revisit patient before discharge to address any further questions.  Loralee Pacas, PharmD, BCPS Pager: [phone] 09/04/2011,11:32 AM

## 2011-09-04 NOTE — Progress Notes (Signed)
CARE MANAGEMENT NOTE 09/04/2011  Patient:  Brent Ortiz, Brent Ortiz   Account Number:  [id number]  Date Initiated:  09/04/2011  Documentation initiated by:  Colleen Can  Subjective/Objective Assessment:   dx left infected knee; I&D left knee with polehtylene exchange revision with placemnt of stimulant beads     Action/Plan:   CM spoke with patient. Pt plans to go bck to his home in [place] where his spouse will be caregiver. States he already has RW, 3N1, cane. pt wants to use [place] for hh services.   Anticipated DC Date:  09/06/2011   Anticipated DC Plan:  HOME W HOME HEALTH SERVICES  In-house referral  NA      DC Planning Services  CM consult      [facility]  HOME HEALTH   Choice offered to / List presented to:  C-1 Patient           Status of service:  In process, will continue to follow  Comments:  09/04/2011 Raynelle Bring BSN CCM [phone] List of [facility] agencies placed on shadow chart. CM will await HH orders.

## 2011-09-04 NOTE — Progress Notes (Signed)
Subjective: 1 Day Post-Op Procedure(s) (LRB): IRRIGATION AND DEBRIDEMENT KNEE WITH POLY EXCHANGE (Left)   Patient reports pain as mild.Much better after having his knee washed out, feels like a lot less pressure. No events throughout the night. Patient did state that prior to admission that he has had different color to his urine for about 1 week.  Objective:   VITALS:   Filed Vitals:   09/04/11 0600  BP: 101/67  Pulse: 69  Temp: 97.5 F (36.4 C)  Resp: 18    Neurovascular intact Dorsiflexion/Plantar flexion intact  LABS  Basename 09/04/11 0405 09/03/11 1800  HGB 9.8* 10.6*  HCT 28.0* 30.2*  WBC 24.2* 26.4*  PLT 481* 538*     Basename 09/04/11 0405 09/03/11 1800  NA 126* 123*  K 3.3* 3.1*  BUN 19 20  CREATININE 0.79 0.94  GLUCOSE 149* 138*     Assessment/Plan: 1 Day Post-Op Procedure(s) (LRB): IRRIGATION AND DEBRIDEMENT KNEE WITH POLY EXCHANGE (Left)  Foley cath d/c'ed Advance diet Up with therapy UA to evaluate urine ID consult regarding infection Hospitalist consult regarding pneumonia  Brent Ortiz. Brent Ortiz   PAC  09/04/2011, 8:27 AM

## 2011-09-04 NOTE — Consult Note (Addendum)
Infectious Diseases Initial Consultation                Day one vancomycin        Day one rifampin        Day one piperacillin tazobactam   Date of Admission:  09/03/2011  Date of Consult:  09/04/2011  Reason for Consult: Left prosthetic knee infection, possible pneumonia and UTI Referring Physician: Dr. Lajoyce Corners   Problem List:  Active Problems:  Infection of prosthetic left knee joint  PNA (pneumonia)  Hyponatremia  Leukocytosis   Recommendations: 1. Continue vancomycin 2. Change piperacillin tazobactam to cefepime 3. Discontinue rifampin 4. Await left knee culture results and chest CT scan   Assessment: Brent Ortiz has gram-negative rod infection of his prosthetic left knee. I will may or other piperacillin tazobactam to cefepime pending cultures. He also has some dysuria and mild pyuria and it's possible that the source of his infection could be asymptomatic urinary tract infection. His admission chest x-ray also shows a left lung density. We do not have any previous chest x-rays to compare with. His history and exam do not suggest that he has acute pneumonia but I agree with coverage for possible healthcare associated pneumonia pending review of his CT scan.    HPI: Brent Ortiz is a 51 y.o. male with multiple medical problems who underwent a left total knee arthroplasty in [place] one year ago. He had no problems healing after surgery but was left with chronic knee pain and underwent a revision arthroplasty in December. He had no healing after that surgery as well and felt much better until he had the sudden onset of left knee pain and swelling 5 days ago.  He states that he's had intermittent episodes of chills and sweats over the past 3 weeks. He is unaware of having any fever during that time but has not taken his temperature. He has had some anorexia over the past week but has actually gained about 7 pounds. Shortly after he began having knee pain 5 days ago he also  noted some discoloration of his urine and some mild dysuria. He has not had any new cough, shortness of breath or chest pain.   Review of Systems: Pertinent items are noted in HPI.     Marland Kitchen acetaminophen  1,000 mg Oral Q8H  . aspirin EC  81 mg Oral Daily  . atorvastatin  20 mg Oral q1800  .  ceFAZolin (ANCEF) IV  2 g Intravenous 60 min Pre-Op  . docusate sodium  100 mg Oral BID  . ferrous sulfate  325 mg Oral TID PC  . insulin aspart  0-20 Units Subcutaneous TID WC  . linagliptin  5 mg Oral Daily  . metoprolol succinate  200 mg Oral Daily  . metoprolol  200 mg Oral Daily  . pantoprazole  40 mg Oral Q1200  . piperacillin-tazobactam (ZOSYN)  IV  3.375 g Intravenous Q8H  . polyethylene glycol  17 g Oral BID  . potassium chloride  40 mEq Oral Once  . rifampin  600 mg Oral Q12H  . vancomycin  1,250 mg Intravenous Q12H  . warfarin  10 mg Oral Once  . Warfarin - Pharmacist Dosing Inpatient   Does not apply q1800  . DISCONTD: amLODipine  2.5 mg Oral Daily  . DISCONTD: ferrous sulfate  325 mg Oral TID PC  . DISCONTD: insulin aspart  0-15 Units Subcutaneous Q4H  . DISCONTD: lisinopril  10 mg Oral Daily  Past Medical History  Diagnosis Date  . HLD (hyperlipidemia)   . Gout   . Osteoarthritis   . HTN (hypertension)   . Coronary artery disease   . Myocardial infarction     04-13-2011  . Diabetes mellitus     niddm  . Poor dentition     History  Substance Use Topics  . Smoking status: Former Smoker -- 1.0 packs/day for 37 years    Types: Cigarettes    Quit date: 04/13/2011  . Smokeless tobacco: Never Used  . Alcohol Use: No    Family History  Problem Relation Age of Onset  . Colon cancer     Allergies  Allergen Reactions  . Motrin (Ibuprofen) Rash    Stomach irritation    OBJECTIVE: Blood pressure 114/74, pulse 85, temperature 97.5 F (36.4 C), temperature source Oral, resp. rate 18, height 5\' 8"  (1.727 m), weight 136.986 kg (302 lb), SpO2 100.00%. General: He is  in some moderate postoperative discomfort but is alert and conversant and in good spirits. Oral: He has multiple missing teeth. His mouth and throat are clear Skin: No rash Lungs: Distant but clear breath sounds throughout Cor: Regular S1 and S2 with no murmurs Abdomen: Obese, soft and nontender without any masses palpated He has a drain in his left knee and his knee is wrapped. He has a compressive stocking on his right leg.  Microbiology: Recent Results (from the past 240 hour(s))  SURGICAL PCR SCREEN     Status: Normal   Collection Time   09/03/11  5:53 PM      Component Value Range Status Comment   MRSA, PCR NEGATIVE  NEGATIVE  Final    Staphylococcus aureus NEGATIVE  NEGATIVE  Final   GRAM STAIN     Status: Normal   Collection Time   09/03/11  6:33 PM      Component Value Range Status Comment   Specimen Description LEG   Final    Special Requests NONE   Final    Gram Stain     Final    Value: ABUNDANT GRAM NEGATIVE RODS     RARE WBC PRESENT, PREDOMINANTLY PMN     RESULT READ BACK AND VERIFIED WITH B Chauncy RN/PACU AT 2230 09/03/2011 BY R LINEBERRY   Report Status 09/03/2011 FINAL   Final   ANAEROBIC CULTURE     Status: Normal (Preliminary result)   Collection Time   09/03/11  6:33 PM      Component Value Range Status Comment   Specimen Description LEG   Final    Special Requests NONE   Final    Gram Stain     Final    Value: RARE WBC PRESENT,BOTH PMN AND MONONUCLEAR     ABUNDANT GRAM NEGATIVE RODS     Gram Stain Report Called to,Read Back By and Verified With: Gram Stain Report Called to,Read Back By and Verified WithOtho Darner RN PACU 2230 09/03/2011 BY Nelva Nay Performed by [facility]   Culture     Final    Value: NO ANAEROBES ISOLATED; CULTURE IN PROGRESS FOR 5 DAYS   Report Status PENDING   Incomplete   CULTURE, ROUTINE-ABSCESS     Status: Normal (Preliminary result)   Collection Time   09/03/11  6:33 PM      Component Value Range Status Comment   Specimen  Description LEG   Final    Special Requests NONE   Final    Gram Stain  Final    Value: RARE WBC PRESENT,BOTH PMN AND MONONUCLEAR     ABUNDANT GRAM NEGATIVE RODS     Gram Stain Report Called to,Read Back By and Verified With: Gram Stain Report Called to,Read Back By and Verified With: Otho Darner RN PACU 2230 09/03/2011 BY Nelva Nay Performed by [facility]   Culture PENDING   Incomplete    Report Status PENDING   Incomplete     Cliffton Asters, MD [facility] [phone] pager   [phone] cell 09/04/2011, 2:02 PM

## 2011-09-04 NOTE — Op Note (Signed)
NAME:  Brent Ortiz, Brent Ortiz NO.:  [id number]  MEDICAL RECORD NO.:  [id number]  LOCATION:  [facility]                         FACILITY:  [facility]  PHYSICIAN:  Madlyn Frankel. Charlann Boxer, M.D.  DATE OF BIRTH:  [birth year]  DATE OF PROCEDURE:  09/03/2011 DATE OF DISCHARGE:                              OPERATIVE REPORT   PREOPERATIVE DIAGNOSIS:  Infected left total knee replacement.  POSTOPERATIVE DIAGNOSIS:  Infected left total knee replacement.  PROCEDURE:  Incision, debridement and irrigation of left total knee with polyethylene exchange, revision with placement of stimulant beads with 1 g of vancomycin and 1 g of tobramycin per 10 mL of stimulant, total of 20 mL used.  COMPONENT USED:  A size 4 x 12.5 posterior stabilized insert.  SURGEON:  Madlyn Frankel. Charlann Boxer, M.D.  ASSISTANT:  Alexzandrew L. Perkins, P.A.C.  ANESTHESIA:  General.  SPECIMENS:  Joint fluid was sent to the lab.  At the time of dictation, culture revealed gram-negative rods.  DRAINS:  One medium Hemovac.  TOURNIQUET TIME:  84 minutes at 250 mmHg.  COMPLICATIONS:  None apparent.  INDICATIONS FOR PROCEDURE:  Brent Ortiz is a 51 year old gentleman with a history of a left total knee revision surgery performed in December.  He had initially been doing very well but presented to the office today with a 3-day history of acute onset of pain and swelling in the knee. No report of any fever or chills.  His wife reported to me that he had had a cough, but he did not have a cough in the office and no report of fevers.  In the office, I aspirated about 150 mL of purulent fluid.  Subsequently he was set up for surgery tonight.  Risks and benefits of the planned procedure and attempted salvage of the implant was reviewed including risks of infection, reinfection, DVT, and the need for removal of implants.  Consent was obtained in the office for this.  PROCEDURE IN DETAIL:  The patient was brought to the operative  theater. Once adequate anesthesia was established, preoperative antibiotics of Ancef 2 g and vancomycin were administered.  The patient was positioned supine with a left proximal thigh tourniquet placed.  The left lower extremity was then scrubbed, prepped and draped in a sterile fashion.  The left leg was placed into the Mayo leg holder.  Time-out was performed identifying the patient, planned procedure, and extremity. The patient's old incision had been identified and marked out.  A sharp incision was made.  Upon entering into the skin, there was immediate expulsion of a significant amount of purulent fluid under pressure.  Further soft tissue dissection and soft tissue planes was created.  We then continued the arthrotomy and opened up the knee.  A good portion of the case at this point was carried out for debridement purposes. Synovectomy was carried out on the medial, lateral and suprapatellar pouches.  Following this, the polyethylene was removed and further debridement of the posterior aspect of the knee was carried out.  Once I was satisfied with the overall debridement, which continued throughout the case, I did irrigate the knee out with 9 L of normal saline solution pulse lavage. As we irrigated  out the knee, we made the stimulant beads on the back table.  Again this two of the 10 mL batches of stimulant, each 10 mL batch was mixed with 1 g of vancomycin 1 g and 1.2 g of tobramycin.  Once the knee had been irrigated thoroughly and the stimulant beads had set up, I placed the beads in the posterior aspect of the knee and then replaced the size 4 x 12.5 polyethylene insert.  The remaining beads were placed in the medial lateral gutters and the suprapatellar pouch. Once these were placed, I placed a medium Hemovac drain deep.  We then reapproximated the extensor mechanism with a number 1 PDS suture.  The subcu layer was closed with 2-0 Vicryl, then staples on the skin.  I  used a 4-0 nylon suture on the corner of his previously placed incision.  The knee was then cleaned, dried and dressed sterilely using Xeroform, a bulky wrap.  The tourniquet was let down after 84 minutes.  The patient was then brought to the recovery room in stable condition tolerating the procedure well.  The patient is going to be admitted to the hospital.  His preoperative x- rays were concerning for pneumonia.  Urine sample was obtained in the operating room.  It will be followed up on the floor.  He was placed on vancomycin, Zosyn and Rifampin.  An Infectious Disease consult will be made.  A hospitalist consult will be made for help with management of pneumonia.     Madlyn Frankel Charlann Boxer, M.D.     MDO/MEDQ  D:  09/03/2011  T:  09/04/2011  Job:  [id number]

## 2011-09-04 NOTE — Consult Note (Signed)
Hospital Admission Note Date: 09/04/2011  PCP: No primary provider on file.  Reason for consultation; PNA.   History of Present Illness: 51 year old African American with medical history significant for hypertension, diabetes, who was admitted on February 28 by Dr. Charlann Boxer for left knee infection. Patient had incision and debridement of left knee on  February 28. Patient had Chest x ray February 28 that show large opacity of left chest / for PNA. Patient relates mild cough for last 3 days. Relates some SOB ocassionally. Denies fevers, chills, weight loss. He does relates left side chest pain since 3 days ago. He quit smoking 8 month ago. No recent hospitalization.     Allergies: Motrin Past Medical History  Diagnosis Date  . HLD (hyperlipidemia)   . Gout   . Osteoarthritis   . HTN (hypertension)   . Coronary artery disease   . Myocardial infarction     04-13-2011  . Diabetes mellitus     niddm   Prior to Admission medications   Medication Sig Start Date End Date Taking? Authorizing Provider  acetaminophen (TYLENOL) 500 MG tablet Take 1,000 mg by mouth every 6 (six) hours as needed. For pain.   Yes Historical Provider, MD  amLODipine (NORVASC) 2.5 MG tablet Take 2.5 mg by mouth every morning.    Yes Historical Provider, MD  aspirin EC 81 MG tablet Take 1 tablet (81 mg total) by mouth daily. 06/26/11  Yes Genelle Gather Babish, PA  ferrous sulfate 325 (65 FE) MG tablet Take 1 tablet (325 mg total) by mouth 3 (three) times daily after meals. 06/25/11 06/24/12 Yes Genelle Gather Babish, PA  lisinopril (PRINIVIL,ZESTRIL) 10 MG tablet Take 10 mg by mouth every morning.    Yes Historical Provider, MD  metoprolol (TOPROL-XL) 200 MG 24 hr tablet Take 200 mg by mouth every morning.    Yes Historical Provider, MD  nitroGLYCERIN (NITROSTAT) 0.4 MG SL tablet Place 0.4 mg under the tongue every 5 (five) minutes as needed. For chest pain   Yes Historical Provider, MD  oxyCODONE-acetaminophen (PERCOCET)  10-325 MG per tablet Take 1-2 tablets by mouth every 6 (six) hours as needed. For pain.   Yes Historical Provider, MD  pantoprazole (PROTONIX) 40 MG tablet Take 40 mg by mouth every morning.    Yes Historical Provider, MD  prasugrel (EFFIENT) 10 MG TABS Take 10 mg by mouth daily.   Yes Historical Provider, MD  rosuvastatin (CRESTOR) 20 MG tablet Take 20 mg by mouth daily.    Yes Historical Provider, MD  sitaGLIPtin (JANUVIA) 100 MG tablet Take 100 mg by mouth every morning.    Yes Historical Provider, MD   Past Surgical History  Procedure Date  . Total knee arthroplasty  right  feb 2012    left march 2012 r  . Hernia repair 2009  . Total knee revision 06/23/2011    Procedure: TOTAL KNEE REVISION;  Surgeon: Shelda Pal;  Location: [facility];  Service: Orthopedics;  Laterality: Left;  femomal nerve block done in holding area without incident   Family History  Problem Relation Age of Onset  . Colon cancer     History   Social History  . Marital Status: Married    Spouse Name: N/A    Number of Children: 5   Occupational History  . CNA, retired.    Social History Main Topics  . Smoking status: Former Smoker -- 1.0 packs/day for 37 years    Types: Cigarettes    Quit  date: 04/13/2011  . Smokeless tobacco: Never Used  . Alcohol Use: No  . Drug Use: No          REVIEW OF SYSTEMS:  Constitutional:  No weight loss,  Fevers, chills, fatigue.  HEENT:  No headaches, Difficulty swallowing,Tooth/dental problems,Sore throat,  No sneezing, itching, ear ache, nasal congestion, post nasal drip,  Cardio-vascular:  , Orthopnea, PND, swelling in lower extremities, anasarca, dizziness, palpitations  GI:  No heartburn, indigestion, abdominal pain, nausea, vomiting, diarrhea, change in bowel habits, loss of appetite  Resp:   No non-productive cough, No coughing up of blood.No change in color of mucus.No wheezing.No chest wall deformity  Skin:  no rash or lesions.  GU:  no dysuria, change  in color of urine, no urgency or frequency. No flank pain.  Musculoskeletal:  . No back pain.  Psych:  No change in mood or affect. No depression or anxiety. No memory loss.   Physical Exam: Filed Vitals:   09/04/11 0230 09/04/11 0600 09/04/11 0928 09/04/11 0929  BP: 107/70 101/67 114/74 114/74  Pulse: 69 69 85   Temp: 97.5 F (36.4 C) 97.5 F (36.4 C)    TempSrc: Oral Oral    Resp: 16 18    Height:      Weight:      SpO2: 100% 100%      Intake/Output Summary (Last 24 hours) at 09/04/11 1242 Last data filed at 09/04/11 1204  Gross per 24 hour  Intake 4711.67 ml  Output   3400 ml  Net 1311.67 ml   BP 114/74  Pulse 85  Temp(Src) 97.5 F (36.4 C) (Oral)  Resp 18  Ht 5\' 8"  (1.727 m)  Wt 136.986 kg (302 lb)  BMI 45.92 kg/m2  SpO2 100%  General Appearance:    Alert, cooperative, no distress, appears stated age  Head:    Normocephalic, without obvious abnormality, atraumatic  Eyes:    PERRL, conjunctiva/corneas clear, EOM's intact,         Ears:    Normal TM's and external ear canals, both ears  Nose:   Nares normal, septum midline, mucosa normal, no drainage    or sinus tenderness  Throat:   Lips, mucosa, and tongue normal; teeth and gums normal  Neck:   Supple, symmetrical, trachea midline, no adenopathy;       thyroid:  No enlargement/tenderness/nodules; no carotid   bruit or JVD     Lungs:     Left side ronchus and crackles, respirations unlabored     Heart:    Regular rate and rhythm, S1 and S2 normal, no murmur, rub   or gallop  Abdomen:     Soft, non-tender, bowel sounds active all four quadrants,    no masses, no organomegaly        Extremities:   Extremities right :  normal, atraumatic, no cyanosis or edema, Left knee with dressing, clean and drainage place.   Pulses:   2+ and symmetric all extremities  Skin:   Skin color, texture, turgor normal, no rashes or lesions  Lymph nodes:   Cervical, supraclavicular, and axillary nodes normal  Neurologic:    CNII-XII intact. Normal strength, sensation and reflexes      throughout   Lab results:  Basename 09/04/11 0405 09/03/11 1800  NA 126* 123*  K 3.3* 3.1*  CL 90* 84*  CO2 27 24  GLUCOSE 149* 138*  BUN 19 20  CREATININE 0.79 0.94  CALCIUM 8.3* 8.7  MG -- --  PHOS -- --    Basename 09/04/11 0405 09/03/11 1800  WBC 24.2* 26.4*  NEUTROABS -- 23.7*  HGB 9.8* 10.6*  HCT 28.0* 30.2*  MCV 80.9 79.9  PLT 481* 538*    Dg Chest 2 View  09/03/2011  *RADIOLOGY REPORT*  Clinical Data: Left knee infection.  Hypertension.  Diabetes.  CHEST - 2 VIEW  Comparison: None.  Findings: Airspace opacity noted in the left chest, primarily involving the upper lobe but also possibly some of the lower lobe. On the lateral projection, I cannot exclude left hilar adenopathy. There is also a right middle lobe subsegmental atelectasis.  Cardiac and mediastinal contours appear unremarkable.  No pleural effusion identified.  IMPRESSION:  1.  Large opacity in the left chest, probably pneumonia involving the lingula and possibly left lower lobe.  There is possible left adenopathy.  Underlying malignancy is not excluded, and follow-up to ensure clearance, or chest CT, is recommended. 2.  Subsegmental atelectasis in the right middle lobe.  Original Report Authenticated By: Dellia Cloud, M.D.    Patient Active Hospital Problem List:  Infection of left  knee  Continue with Vancomycin, Zosyn, Rifampin.  Per ortho management. Follow culture result.   PNA (pneumonia) (09/04/2011) ? Likely Community acquired. Patient already on Vancomycin and Zosyn for knee infection. This should cover for PNA.  I will check blood culture, sputum culture. Legionella antigen, strep antigen. I will check CT chest concern for malignancy , patient with history of smoking.   Hyponatremia (09/04/2011)  Continue with IV fluids.   Leukocytosis (09/04/2011) Likely secondary to infection, Left knee and PNA.  WBC trending down.    Diabetes Continue with SSI, linagliptin.   Hypertension Hold lisinopril while patient has hyponatremia. Continue with metoprolol. Hold Norvasc SBP in the 100 range.   Hypokalemia: replaced with 40 meq PO times 1. Check Mg level.  Anemia; Continue with ferrous Sulfate.   Russ Looper M.D. [facility] [phone] 09/04/2011, 12:42 PM

## 2011-09-04 NOTE — Progress Notes (Signed)
Report to tony rn

## 2011-09-05 LAB — BASIC METABOLIC PANEL
CO2: 24 mEq/L (ref 19–32)
Chloride: 100 mEq/L (ref 96–112)
Glucose, Bld: 97 mg/dL (ref 70–99)
Potassium: 4 mEq/L (ref 3.5–5.1)
Sodium: 132 mEq/L — ABNORMAL LOW (ref 135–145)

## 2011-09-05 LAB — PROTIME-INR
INR: 1.31 (ref 0.00–1.49)
Prothrombin Time: 16.5 seconds — ABNORMAL HIGH (ref 11.6–15.2)

## 2011-09-05 LAB — MAGNESIUM: Magnesium: 2.4 mg/dL (ref 1.5–2.5)

## 2011-09-05 LAB — CBC
Hemoglobin: 9.3 g/dL — ABNORMAL LOW (ref 13.0–17.0)
RBC: 3.33 MIL/uL — ABNORMAL LOW (ref 4.22–5.81)
WBC: 18.5 10*3/uL — ABNORMAL HIGH (ref 4.0–10.5)

## 2011-09-05 LAB — GLUCOSE, CAPILLARY
Glucose-Capillary: 99 mg/dL (ref 70–99)
Glucose-Capillary: 124 mg/dL — ABNORMAL HIGH (ref 70–99)

## 2011-09-05 MED ORDER — WARFARIN SODIUM 10 MG PO TABS
10.0000 mg | ORAL_TABLET | Freq: Once | ORAL | Status: AC
  Administered 2011-09-05: 10 mg via ORAL
  Filled 2011-09-05: qty 1

## 2011-09-05 NOTE — Progress Notes (Signed)
Subjective: aalking hallway with PT   Antibiotics:  Anti-infectives     Start     Dose/Rate Route Frequency Ordered Stop   09/04/11 1500   ceFEPIme (MAXIPIME) 1 g in dextrose 5 % 50 mL IVPB        1 g 100 mL/hr over 30 Minutes Intravenous Every 12 hours 09/04/11 1412     09/04/11 0600   vancomycin (VANCOCIN) 1,250 mg in sodium chloride 0.9 % 250 mL IVPB        1,250 mg 166.7 mL/hr over 90 Minutes Intravenous Every 12 hours 09/04/11 0144     09/04/11 0200   rifampin (RIFADIN) capsule 600 mg  Status:  Discontinued        600 mg Oral Every 12 hours 09/04/11 0100 09/04/11 1412   09/04/11 0200   piperacillin-tazobactam (ZOSYN) IVPB 3.375 g  Status:  Discontinued        3.375 g 12.5 mL/hr over 240 Minutes Intravenous Every 8 hours 09/04/11 0113 09/04/11 1412   09/03/11 2138   vancomycin (VANCOCIN) powder  Status:  Discontinued          As needed 09/03/11 2138 09/03/11 2229   09/03/11 2137   tobramycin (NEBCIN) powder  Status:  Discontinued          As needed 09/03/11 2138 09/03/11 2229   09/03/11 1746   ceFAZolin (ANCEF) IVPB 2 g/50 mL premix        2 g 100 mL/hr over 30 Minutes Intravenous 60 min pre-op 09/03/11 1746 09/03/11 2028          Medications: Scheduled Meds:   . acetaminophen  1,000 mg Oral Q8H  . aspirin EC  81 mg Oral Daily  . atorvastatin  20 mg Oral q1800  . ceFEPime (MAXIPIME) IV  1 g Intravenous Q12H  . docusate sodium  100 mg Oral BID  . ferrous sulfate  325 mg Oral TID PC  . insulin aspart  0-20 Units Subcutaneous TID WC  . linagliptin  5 mg Oral Daily  . metoprolol  200 mg Oral Daily  . pantoprazole  40 mg Oral Q1200  . polyethylene glycol  17 g Oral BID  . potassium chloride  40 mEq Oral Once  . sodium chloride  10-40 mL Intracatheter Q12H  . vancomycin  1,250 mg Intravenous Q12H  . warfarin  10 mg Oral ONCE-1800  . Warfarin - Pharmacist Dosing Inpatient   Does not apply q1800  . DISCONTD: amLODipine  2.5 mg Oral Daily  . DISCONTD: lisinopril   10 mg Oral Daily  . DISCONTD: piperacillin-tazobactam (ZOSYN)  IV  3.375 g Intravenous Q8H  . DISCONTD: rifampin  600 mg Oral Q12H   Continuous Infusions:   . 0.9 % NaCl with KCl 20 mEq / L 125 mL/hr at 09/05/11 0232   PRN Meds:.acetaminophen, acetaminophen, HYDROmorphone, iohexol, menthol-cetylpyridinium, methocarbamol(ROBAXIN) IV, methocarbamol, metoCLOPramide (REGLAN) injection, metoCLOPramide, nitroGLYCERIN, ondansetron (ZOFRAN) IV, ondansetron, oxyCODONE, phenol, sodium chloride, sodium phosphate, DISCONTD: oxyCODONE   Objective: Weight change:   Intake/Output Summary (Last 24 hours) at 09/05/11 1055 Last data filed at 09/05/11 0804  Gross per 24 hour  Intake 4885.42 ml  Output   3815 ml  Net 1070.42 ml   Blood pressure 115/74, pulse 74, temperature 97.8 F (36.6 C), temperature source Oral, resp. rate 18, height 5\' 8"  (1.727 m), weight 302 lb (136.986 kg), SpO2 100.00%. Temp:  [97.8 F (36.6 C)-98.7 F (37.1 C)] 97.8 F (36.6 C) (03/02 0602) Pulse Rate:  [73-82] 74  (  03/02 0602) Resp:  [16-18] 18  (03/02 0602) BP: (101-115)/(65-74) 115/74 mmHg (03/02 0602) SpO2:  [99 %-100 %] 100 % (03/02 0602)  Physical Exam: General: Alert and awake, oriented x3, not in any acute distress. HEENT: EOMI  CVS regular rate, normal r,  no murmur rubs or gallops Chest: clear to auscultation bilaterally, no wheezing, rales or rhonchi Abdomen: soft nontender, nondistended, normal bowel sounds, Extremities:ndressing and drain in place Skin: no rashes Lymph: no new lymphadenopathy Neuro: nonfocal  Lab Results:  Basename 09/05/11 0525 09/04/11 0405  WBC 18.5* 24.2*  HGB 9.3* 9.8*  HCT 27.4* 28.0*  PLT 526* 481*    BMET  Basename 09/05/11 0525 09/04/11 0405  NA 132* 126*  K 4.0 3.3*  CL 100 90*  CO2 24 27  GLUCOSE 97 149*  BUN 19 19  CREATININE 1.05 0.79  CALCIUM 8.5 8.3*    Micro Results: Recent Results (from the past 240 hour(s))  SURGICAL PCR SCREEN     Status: Normal    Collection Time   09/03/11  5:53 PM      Component Value Range Status Comment   MRSA, PCR NEGATIVE  NEGATIVE  Final    Staphylococcus aureus NEGATIVE  NEGATIVE  Final   GRAM STAIN     Status: Normal   Collection Time   09/03/11  6:33 PM      Component Value Range Status Comment   Specimen Description LEG   Final    Special Requests NONE   Final    Gram Stain     Final    Value: ABUNDANT GRAM NEGATIVE RODS     RARE WBC PRESENT, PREDOMINANTLY PMN     RESULT READ BACK AND VERIFIED WITH B Diontae RN/PACU AT 2230 09/03/2011 BY R LINEBERRY   Report Status 09/03/2011 FINAL   Final   ANAEROBIC CULTURE     Status: Normal (Preliminary result)   Collection Time   09/03/11  6:33 PM      Component Value Range Status Comment   Specimen Description LEG   Final    Special Requests NONE   Final    Gram Stain     Final    Value: RARE WBC PRESENT,BOTH PMN AND MONONUCLEAR     ABUNDANT GRAM NEGATIVE RODS     Gram Stain Report Called to,Read Back By and Verified With: Gram Stain Report Called to,Read Back By and Verified WithOtho Darner RN PACU 2230 09/03/2011 BY R LINEBERRY Performed by [facility]   Culture     Final    Value: NO ANAEROBES ISOLATED; CULTURE IN PROGRESS FOR 5 DAYS   Report Status PENDING   Incomplete   CULTURE, ROUTINE-ABSCESS     Status: Normal (Preliminary result)   Collection Time   09/03/11  6:33 PM      Component Value Range Status Comment   Specimen Description LEG   Final    Special Requests NONE   Final    Gram Stain     Final    Value: RARE WBC PRESENT,BOTH PMN AND MONONUCLEAR     ABUNDANT GRAM NEGATIVE RODS     Gram Stain Report Called to,Read Back By and Verified With: Gram Stain Report Called to,Read Back By and Verified WithOtho Darner RN PACU 2230 09/03/2011 BY Nelva Nay Performed by [facility]   Culture Culture reincubated for better growth   Final    Report Status PENDING   Incomplete   URINE CULTURE  Status: Normal   Collection Time   09/03/11   9:08 PM      Component Value Range Status Comment   Specimen Description URINE, CATHETERIZED   Final    Special Requests NONE   Final    Culture  Setup Time [id number]   Final    Colony Count NO GROWTH   Final    Culture NO GROWTH   Final    Report Status 09/04/2011 FINAL   Final   RAPID STREP SCREEN     Status: Normal   Collection Time   09/04/11  2:25 PM      Component Value Range Status Comment   Streptococcus, Group A Screen (Direct) NEGATIVE  NEGATIVE  Final     Studies/Results: Dg Chest 2 View  09/03/2011  *RADIOLOGY REPORT*  Clinical Data: Left knee infection.  Hypertension.  Diabetes.  CHEST - 2 VIEW  Comparison: None.  Findings: Airspace opacity noted in the left chest, primarily involving the upper lobe but also possibly some of the lower lobe. On the lateral projection, I cannot exclude left hilar adenopathy. There is also a right middle lobe subsegmental atelectasis.  Cardiac and mediastinal contours appear unremarkable.  No pleural effusion identified.  IMPRESSION:  1.  Large opacity in the left chest, probably pneumonia involving the lingula and possibly left lower lobe.  There is possible left adenopathy.  Underlying malignancy is not excluded, and follow-up to ensure clearance, or chest CT, is recommended. 2.  Subsegmental atelectasis in the right middle lobe.  Original Report Authenticated By: Dellia Cloud, M.D.   Ct Chest W Contrast  09/04/2011  *RADIOLOGY REPORT*  Clinical Data: Evaluate lung opacity.  CT CHEST WITH CONTRAST  Technique:  Multidetector CT imaging of the chest was performed following the standard protocol during bolus administration of intravenous contrast.  Contrast: 80mL OMNIPAQUE IOHEXOL 300 MG/ML IJ SOLN  Comparison: Chest radiographs 09/03/2011.  Findings: There is dense ill-defined left perihilar airspace opacity involving the central portions of the left upper and lower lobes.  There are associated air bronchograms and extrinsic bronchial narrowing.   No endobronchial mass is demonstrated.  In the right lung, there is streaky atelectasis or scarring without air space disease.  There is a small (6 mm) subpleural nodular density in the right middle lobe.  There is no significant pleural or pericardial effusion.  Numerous prominent mediastinal lymph nodes are noted.  These include a high right paratracheal node measuring 14 mm on image 8, a 15 mm AP window node on image 17 and a 16 mm subcarinal node on image 26. Contrast bolus is suboptimal, limiting hilar assessment.  Right arm PICC is in place. There is some coronary artery disease.  The visualized upper abdomen is notable for mild hepatic steatosis and postsurgical changes in the anterior abdominal wall.  There is no adrenal mass.  IMPRESSION:  1.  Central airspace disease in the left lung is perihilar in distribution, involving the upper and lower lobes.  Appearance is nonspecific and could reflect atypical pneumonia.  However, infiltrating neoplasm cannot be excluded. 2.  Multiple mildly enlarged mediastinal lymph nodes are nonspecific and potentially reactive. 3.  No significant pleural effusion.  If there is clinical evidence of pneumonia, radiographic followup after appropriate therapy would be advised.  If there is clinical concern of neoplasm, bronchoscopy should be considered.  Original Report Authenticated By: Gerrianne Scale, M.D.      Assessment/Plan: Brent Ortiz is a 51 y.o. 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 Pneumonia: --would give him 7 days of therapy with vancomycin (along with GNR coverage) --He should have  A repeat CT scan done in another month to make sure these infiltrates are resolving and if they are nto I would get a bronchoscopy and biopsies to evaluate for malignancy   LOS: 2 days     Acey Lav 09/05/2011, 10:55 AM

## 2011-09-05 NOTE — Progress Notes (Signed)
Physical Therapy Treatment Patient Details Name: Brent Ortiz MRN: [id number] DOB: 02/10/1961 Today's Date: 09/05/2011  PT Assessment/Plan  PT - Assessment/Plan Comments on Treatment Session: pt tolerated well . PT Plan: Discharge plan remains appropriate;Frequency remains appropriate PT Frequency: 7X/week Recommendations for Other Services: OT consult Follow Up Recommendations: Home health PT;Supervision/Assistance - 24 hour Equipment Recommended: Wheelchair (measurements) PT Goals  Acute Rehab PT Goals PT Goal Formulation: With patient Time For Goal Achievement: 7 days Pt will go Supine/Side to Sit: with supervision;with HOB 0 degrees PT Goal: Supine/Side to Sit - Progress: Goal set today Pt will go Sit to Supine/Side: with supervision PT Goal: Sit to Supine/Side - Progress: Progressing toward goal Pt will go Sit to Stand: with supervision PT Goal: Sit to Stand - Progress: Progressing toward goal Pt will Ambulate: 51 - 150 feet;with supervision;with rolling walker PT Goal: Ambulate - Progress: Progressing toward goal Pt will Go Up / Down Stairs: 1-2 stairs;with min assist;with rolling walker PT Goal: Up/Down Stairs - Progress: Goal set today Pt will Perform Home Exercise Program: with supervision, verbal cues required/provided PT Goal: Perform Home Exercise Program - Progress: Progressing toward goal  PT Treatment Precautions/Restrictions  Precautions Precautions: Knee Required Braces or Orthoses: Yes Knee Immobilizer: Discontinue once straight leg raise with < 10 degree lag Restrictions Weight Bearing Restrictions: No LLE Weight Bearing: Weight bearing as tolerated Mobility (including Balance)  Posture/Postural Control Posture/Postural Control: No significant limitations Exercise  Total Joint Exercises Ankle Circles/Pumps: AROM;Left;10 reps;Supine Quad Sets: AROM;Left;10 reps;Supine Heel Slides: AAROM;Left;10 reps;Supine Hip ABduction/ADduction: AAROM;Left;10  reps;Supine Straight Leg Raises: AAROM;Left;10 reps;Supine End of Session PT - End of Session Equipment Utilized During Treatment: Left knee immobilizer Activity Tolerance: Patient tolerated treatment well Patient left: in bed;with call bell in reach Nurse Communication: Mobility status for transfers General Behavior During Session: [facility] for tasks performed Cognition: [name] for tasks performed  Brent Ortiz 09/05/2011, 4:37 PM

## 2011-09-05 NOTE — Evaluation (Signed)
Physical Therapy Evaluation Patient Details Name: Brent Ortiz MRN: [id number] DOB: 10/17/1960 Today's Date: 09/05/2011  Problem List:  Patient Active Problem List  Diagnoses  . S/P left knee revision  . PNA (pneumonia)  . Infection of prosthetic left knee joint  . Hyponatremia  . Leukocytosis    Past Medical History:  Past Medical History  Diagnosis Date  . HLD (hyperlipidemia)   . Gout   . Osteoarthritis   . HTN (hypertension)   . Coronary artery disease   . Myocardial infarction     04-13-2011  . Diabetes mellitus     niddm  . Poor dentition    Past Surgical History:  Past Surgical History  Procedure Date  . Total knee arthroplasty  right  feb 2012    left march 2012 r  . Hernia repair 2009  . Total knee revision 06/23/2011    Procedure: TOTAL KNEE REVISION;  Surgeon: Shelda Pal;  Location: [facility];  Service: Orthopedics;  Laterality: Left;  femomal nerve block done in holding area without incident    PT Assessment/Plan/Recommendation PT Assessment Clinical Impression Statement: pt tolerated well after having not gotten up 3/1 due to medical tests.  pt is s/p revision LTKA for infection. pt will benefit from PT to imoprove ROM, strength, mobility to dc to home. PT Recommendation/Assessment: Patient will need skilled PT in the acute care venue PT Problem List: Decreased strength;Decreased range of motion;Decreased activity tolerance;Decreased knowledge of use of DME;Pain;Decreased knowledge of precautions PT Therapy Diagnosis : Difficulty walking;Acute pain PT Plan PT Frequency: 7X/week PT Treatment/Interventions: DME instruction;Gait training;Stair training;Functional mobility training;Therapeutic exercise;Therapeutic activities;Patient/family education PT Recommendation Recommendations for Other Services: OT consult Follow Up Recommendations: Home health PT;Supervision/Assistance - 24 hour Equipment Recommended: Wheelchair (measurements) (pt requests WC ) PT  Goals  Acute Rehab PT Goals PT Goal Formulation: With patient Time For Goal Achievement: 7 days Pt will go Supine/Side to Sit: with supervision;with HOB 0 degrees PT Goal: Supine/Side to Sit - Progress: Goal set today Pt will go Sit to Supine/Side: with supervision PT Goal: Sit to Supine/Side - Progress: Goal set today Pt will go Sit to Stand: with supervision PT Goal: Sit to Stand - Progress: Goal set today Pt will Ambulate: 51 - 150 feet;with supervision;with rolling walker PT Goal: Ambulate - Progress: Goal set today Pt will Go Up / Down Stairs: 1-2 stairs;with min assist;with rolling walker PT Goal: Up/Down Stairs - Progress: Goal set today Pt will Perform Home Exercise Program: with supervision, verbal cues required/provided PT Goal: Perform Home Exercise Program - Progress: Goal set today  PT Evaluation Precautions/Restrictions  Precautions Precautions: Knee Required Braces or Orthoses: Yes Knee Immobilizer: Discontinue once straight leg raise with < 10 degree lag Restrictions Weight Bearing Restrictions: No Prior Functioning  Home Living Lives With: Spouse Receives Help From: Family;Friend(s) Type of Home: House Home Layout: One level Entrance Stairs-Number of Steps: 1 Prior Function Level of Independence: Independent with basic ADLs Able to Take Stairs?: Yes Cognition Cognition Arousal/Alertness: Awake/alert Sensation/Coordination Sensation Light Touch: Appears Intact Coordination Gross Motor Movements are Fluid and Coordinated: Yes Extremity Assessment RLE Assessment RLE Assessment: Within Functional Limits LLE Assessment LLE Assessment: Exceptions to WFL LLE AROM (degrees) LLE Overall AROM Comments: NT LLE Strength LLE Overall Strength Comments: requires assist to move LLE to eob. Mobility (including Balance) Bed Mobility Bed Mobility: Yes Supine to Sit: 3: Mod assist Supine to Sit Details (indicate cue type and reason): min cues not to hold  breath Transfers Transfers:  Yes Sit to Stand: 4: Min assist;From elevated surface;With upper extremity assist;From bed;Other (comment) Sit to Stand Details (indicate cue type and reason): vc to push from bed. Stand to Sit: 4: Min assist;To chair/3-in-1;With armrests Stand to Sit Details: vc to reach back, step LLE forward Ambulation/Gait Ambulation/Gait: Yes Ambulation/Gait Assistance: 1: +2 Total assist Ambulation/Gait Assistance Details (indicate cue type and reason): vxc for sequence Ambulation Distance (Feet): 50 Feet Assistive device: Rolling walker Gait Pattern: Step-to pattern  Posture/Postural Control Posture/Postural Control: No significant limitations Exercise    End of Session PT - End of Session Equipment Utilized During Treatment: Left knee immobilizer Activity Tolerance: Patient tolerated treatment well Patient left: in chair;with call bell in reach Nurse Communication: Mobility status for transfers General Behavior During Session: [facility] for tasks performed Cognition: [facility] for tasks performed  [name] 09/05/2011, 840

## 2011-09-05 NOTE — Progress Notes (Signed)
Subjective: 2 Days Post-Op Procedure(s) (LRB): IRRIGATION AND DEBRIDEMENT KNEE WITH POLY EXCHANGE (Left)   Patient reports pain as mild. Again, feeling better everyday and especially better than prior to being in the hospital. No events.  Objective:   VITALS:   Filed Vitals:   09/05/11 0602  BP: 115/74  Pulse: 74  Temp: 97.8 F (36.6 C)  Resp: 18    Neurovascular intact Dorsiflexion/Plantar flexion intact Incision: dressing C/D/I No cellulitis present Compartment soft  LABS  Basename 09/05/11 0525 09/04/11 0405 09/03/11 1800  HGB 9.3* 9.8* 10.6*  HCT 27.4* 28.0* 30.2*  WBC 18.5* 24.2* 26.4*  PLT 526* 481* 538*     Basename 09/05/11 0525 09/04/11 0405 09/03/11 1800  NA 132* 126* 123*  K 4.0 3.3* 3.1*  BUN 19 19 20   CREATININE 1.05 0.79 0.94  GLUCOSE 97 149* 138*     Assessment/Plan: 2 Days Post-Op Procedure(s) (LRB): IRRIGATION AND DEBRIDEMENT KNEE WITH POLY EXCHANGE (Left)   Up with therapy   [name]. Sahir Tolson   PAC  09/05/2011, 8:35 AM

## 2011-09-05 NOTE — Progress Notes (Signed)
Physical Therapy Treatment Patient Details Name: Brent Ortiz MRN: [id number] DOB: 1961-03-18 Today's Date: 09/05/2011  PT Assessment/Plan  PT - Assessment/Plan Comments on Treatment Session: pt tolerated well . PT Plan: Discharge plan remains appropriate;Frequency remains appropriate PT Frequency: 7X/week Recommendations for Other Services: OT consult Follow Up Recommendations: Home health PT;Supervision/Assistance - 24 hour Equipment Recommended: Wheelchair (measurements) PT Goals  Acute Rehab PT Goals PT Goal Formulation: With patient Time For Goal Achievement: 7 days Pt will go Supine/Side to Sit: with supervision;with HOB 0 degrees PT Goal: Supine/Side to Sit - Progress: Goal set today Pt will go Sit to Supine/Side: with supervision PT Goal: Sit to Supine/Side - Progress: Progressing toward goal Pt will go Sit to Stand: with supervision PT Goal: Sit to Stand - Progress: Progressing toward goal Pt will Ambulate: 51 - 150 feet;with supervision;with rolling walker PT Goal: Ambulate - Progress: Progressing toward goal Pt will Go Up / Down Stairs: 1-2 stairs;with min assist;with rolling walker PT Goal: Up/Down Stairs - Progress: Goal set today Pt will Perform Home Exercise Program: with supervision, verbal cues required/provided PT Goal: Perform Home Exercise Program - Progress: Goal set today  PT Treatment Precautions/Restrictions  Precautions Precautions: Knee Required Braces or Orthoses: Yes Knee Immobilizer: Discontinue once straight leg raise with < 10 degree lag Restrictions Weight Bearing Restrictions: No LLE Weight Bearing: Weight bearing as tolerated Mobility (including Balance) Bed Mobility Bed Mobility: Yes  Sit to Supine: 3: Mod assist;HOB flat Transfers Transfers: Yes Sit to Stand: 4: Min assist;From chair/3-in-1;With armrests Sit to Stand Details (indicate cue type and reason): vc to push from chairStand to Sit: 4: Min assist;To bed;With upper extremity  assist Stand to Sit Details: vc to reach back, step LLE forward Ambulation/Gait Ambulation/Gait: Yes Ambulation/Gait Assistance: 4: Min assist Ambulation/Gait Assistance Details (indicate cue type and reason): vxc for sequence Ambulation Distance (Feet): 50 Feet Assistive device: Rolling walker Gait Pattern: Step-to pattern  Posture/Postural Control Posture/Postural Control: No significant limitations Exercise    End of Session PT - End of Session Equipment Utilized During Treatment: Left knee immobilizer Activity Tolerance: Patient tolerated treatment well Patient left: in bed;with call bell in reach Nurse Communication: Mobility status for transfers General Behavior During Session: [facility] for tasks performed Cognition: [facility] for tasks performed  Brent Ortiz 09/05/2011, 4:34 PM

## 2011-09-05 NOTE — Evaluation (Signed)
Occupational Therapy Evaluation Patient Details Name: Brent Ortiz MRN: [id number] DOB: Jun 01, 1961 Today's Date: 09/05/2011  Problem List:  Patient Active Problem List  Diagnoses  . S/P left knee revision  . PNA (pneumonia)  . Infection of prosthetic left knee joint  . Hyponatremia  . Leukocytosis    Past Medical History:  Past Medical History  Diagnosis Date  . HLD (hyperlipidemia)   . Gout   . Osteoarthritis   . HTN (hypertension)   . Coronary artery disease   . Myocardial infarction     04-13-2011  . Diabetes mellitus     niddm  . Poor dentition    Past Surgical History:  Past Surgical History  Procedure Date  . Total knee arthroplasty  right  feb 2012    left march 2012 r  . Hernia repair 2009  . Total knee revision 06/23/2011    Procedure: TOTAL KNEE REVISION;  Surgeon: Shelda Pal;  Location: [facility];  Service: Orthopedics;  Laterality: Left;  femomal nerve block done in holding area without incident    OT Assessment/Plan/Recommendation OT Assessment Clinical Impression Statement: This 51 year old male was admitted for an I&D polyexchange for L TKA.  He also has pna and uti.  Pt has help at home and was independent but now requires mod to max A for LB ADLs.  He has used a Sports administrator in the past for ADLs but it is broken.  OT will issue him one at this time from indigent closet.  Pt also needs a wide 3:1.  No further OT needs.   OT Recommendation/Assessment: Patient does not need any further OT services OT Recommendation Follow Up Recommendations: No OT follow up Equipment Recommended: 3 in 1 bedside comode;Other (comment) (wide) OT Goals    OT Evaluation Precautions/Restrictions  Precautions Precautions: Knee Required Braces or Orthoses: Yes Restrictions Weight Bearing Restrictions: Yes LLE Weight Bearing: Weight bearing as tolerated Prior Functioning Home Living Lives With: Spouse Type of Home: House Home Layout: One level Home Access: Stairs to  enter [employer] of Steps: 1 Bathroom Shower/Tub: Engineer, manufacturing systems: Standard Prior Function Level of Independence: Independent with basic ADLs Driving: Yes ADL ADL Grooming: Simulated;Set up Where Assessed - Grooming: Sitting, chair;Supported Upper Body Bathing: Simulated;Set up Where Assessed - Upper Body Bathing: Sitting, chair;Supported Lower Body Bathing: Simulated;Moderate assistance Where Assessed - Lower Body Bathing: Sit to stand from chair Upper Body Dressing: Simulated;Minimal assistance;Other (comment) (iv) Where Assessed - Upper Body Dressing: Sitting, chair;Supported Lower Body Dressing: Simulated;Maximal assistance Where Assessed - Lower Body Dressing: Sit to stand from chair Toilet Transfer: Simulated;Minimal assistance;Other (comment) Nurse, children's) Toilet Transfer Method: Ambulating Toileting - Clothing Manipulation: Simulated;Minimal assistance Where Assessed - Toileting Clothing Manipulation: Sit to stand from 3-in-1 or toilet Toileting - Hygiene: Simulated;Minimal assistance Where Assessed - Toileting Hygiene: Sit to stand from 3-in-1 or toilet Tub/Shower Transfer: Not assessed (has tub will sponge bathe initially) Equipment Used: Rolling walker Ambulation Related to ADLs: co tx with PT:  Pt ambulated to hall ADL Comments: has had a reacher in the past:  will be helpful:  will issue one Vision/Perception  Vision - History Baseline Vision: No visual deficits Cognition Cognition Overall Cognitive Status: Appears within functional limits for tasks assessed Orientation Level: Oriented X4 Sensation/Coordination   Extremity Assessment RUE Assessment RUE Assessment: Within Functional Limits LUE Assessment LUE Assessment: Within Functional Limits Mobility  Bed Mobility Bed Mobility: Yes Supine to Sit: Other (comment);3: Mod assist (used sheet to assist) Supine to  Sit Details (indicate cue type and reason): min cues not to hold  breath Transfers Transfers: Yes Sit to Stand: 4: Min assist;From elevated surface;With upper extremity assist;From bed;Other (comment) (VERY HIGH HEIGHT) Exercises   End of Session OT - End of Session Equipment Utilized During Treatment: Gait belt;Left knee immobilizer Activity Tolerance: Patient tolerated treatment well Patient left: in chair;with call bell in reach General Behavior During Session: [facility] for tasks performed Cognition: [facility] for tasks performed [facility], OTR/L [phone] 09/05/2011  Brent Ortiz 09/05/2011, 10:03 AM

## 2011-09-05 NOTE — Progress Notes (Signed)
ANTICOAGULATION CONSULT NOTE - Follow Up Consult  Pharmacy Consult for Warfarin Indication: VTE prophylaxis  Allergies  Allergen Reactions  . Motrin (Ibuprofen) Rash    Stomach irritation    Patient Measurements: Height: 5\' 8"  (172.7 cm) Weight: 302 lb (136.986 kg) IBW/kg (Calculated) : 68.4    Vital Signs: Temp: 97.8 F (36.6 C) (03/02 0602) BP: 115/74 mmHg (03/02 0602) Pulse Rate: 74  (03/02 0602)  Labs:  Basename 09/05/11 0525 09/04/11 0405 09/03/11 1800  HGB 9.3* 9.8* --  HCT 27.4* 28.0* 30.2*  PLT 526* 481* 538*  APTT -- -- 38*  LABPROT 16.5* 15.5* 16.0*  INR 1.31 1.20 1.25  HEPARINUNFRC -- -- --  CREATININE 1.05 0.79 0.94  CKTOTAL -- -- --  CKMB -- -- --  TROPONINI -- -- --   Estimated Creatinine Clearance: 110.2 ml/min (by C-G formula based on Cr of 1.05).   Medications:  Scheduled:     . acetaminophen  1,000 mg Oral Q8H  . aspirin EC  81 mg Oral Daily  . atorvastatin  20 mg Oral q1800  . ceFEPime (MAXIPIME) IV  1 g Intravenous Q12H  . docusate sodium  100 mg Oral BID  . ferrous sulfate  325 mg Oral TID PC  . insulin aspart  0-20 Units Subcutaneous TID WC  . linagliptin  5 mg Oral Daily  . metoprolol  200 mg Oral Daily  . pantoprazole  40 mg Oral Q1200  . polyethylene glycol  17 g Oral BID  . potassium chloride  40 mEq Oral Once  . sodium chloride  10-40 mL Intracatheter Q12H  . vancomycin  1,250 mg Intravenous Q12H  . Warfarin - Pharmacist Dosing Inpatient   Does not apply q1800  . DISCONTD: amLODipine  2.5 mg Oral Daily  . DISCONTD: lisinopril  10 mg Oral Daily  . DISCONTD: piperacillin-tazobactam (ZOSYN)  IV  3.375 g Intravenous Q8H  . DISCONTD: rifampin  600 mg Oral Q12H   Anticoag doses: Warfarin 10mg  on 3/1  Assessment:  82 YOM s/p TKA 09/2010, with revision 06/2011 now with infected knee s/p I&D with exchange 09/03/11  Of note, patient stopped taking Effient (prasugrel) 1 week post op which he states he started about 9 months ago s/p  MI.  He thinks the plan is to no longer take this due to cost, and continue warfarin indefinitely.  He was strongly encouraged to confirm the plan with his physician.  No longer has drug-drug interaction with rifampin  INR subtherapeutic, but increasing as expected  No complications/bleeding reported  Education started 3/1  Goal of Therapy:  INR 2-3   Plan:   Warfarin 10mg  PO today at 1800  Daily INR, CBC  Re-visit patient to address further questions about warfarin eduction.    Lynann Beaver PharmD  Pager [phone] 09/05/2011 9:46 AM

## 2011-09-05 NOTE — Progress Notes (Signed)
Subjective: Patient feeling well. Denies SOB. Had BM.  Patient aware that need CT chest follow up.  Objective: Filed Vitals:   09/04/11 1444 09/04/11 1837 09/04/11 2126 09/05/11 0602  BP: 107/65 111/68 101/66 115/74  Pulse: 77 82 73 74  Temp: 98.7 F (37.1 C) 98 F (36.7 C) 98.2 F (36.8 C) 97.8 F (36.6 C)  TempSrc: Oral Oral    Resp: 18 18 16 18   Height:      Weight:      SpO2: 99% 99% 99% 100%   Weight change:   Intake/Output Summary (Last 24 hours) at 09/05/11 1218 Last data filed at 09/05/11 1207  Gross per 24 hour  Intake 4645.42 ml  Output   3815 ml  Net 830.42 ml    General: Alert, awake, oriented x3, in no acute distress.  HEENT: No bruits, no goiter.  Heart: Regular rate and rhythm, without murmurs, rubs, gallops.  Lungs: CTA, bilateral air movement.  Abdomen: Soft, nontender, nondistended, positive bowel sounds.  Neuro: Grossly intact, nonfocal. Extremities; left knee with dressing clean.   Lab Results:  [facility] 09/05/11 0525 09/04/11 0405  NA 132* 126*  K 4.0 3.3*  CL 100 90*  CO2 24 27  GLUCOSE 97 149*  BUN 19 19  CREATININE 1.05 0.79  CALCIUM 8.5 8.3*  MG 2.4 --  PHOS -- --   Basename 09/05/11 0525 09/04/11 0405 09/03/11 1800  WBC 18.5* 24.2* --  NEUTROABS -- -- 23.7*  HGB 9.3* 9.8* --  HCT 27.4* 28.0* --  MCV 82.3 80.9 --  PLT 526* 481* --    Basename 09/04/11 0405  HGBA1C 7.1*    Micro Results: Recent Results (from the past 240 hour(s))  SURGICAL PCR SCREEN     Status: Normal   Collection Time   09/03/11  5:53 PM      Component Value Range Status Comment   MRSA, PCR NEGATIVE  NEGATIVE  Final    Staphylococcus aureus NEGATIVE  NEGATIVE  Final   GRAM STAIN     Status: Normal   Collection Time   09/03/11  6:33 PM      Component Value Range Status Comment   Specimen Description LEG   Final    Special Requests NONE   Final    Gram Stain     Final    Value: ABUNDANT GRAM NEGATIVE RODS     RARE WBC PRESENT, PREDOMINANTLY PMN   RESULT READ BACK AND VERIFIED WITH B Josemiguel RN/PACU AT 2230 09/03/2011 BY R LINEBERRY   Report Status 09/03/2011 FINAL   Final   ANAEROBIC CULTURE     Status: Normal (Preliminary result)   Collection Time   09/03/11  6:33 PM      Component Value Range Status Comment   Specimen Description LEG   Final    Special Requests NONE   Final    Gram Stain     Final    Value: RARE WBC PRESENT,BOTH PMN AND MONONUCLEAR     ABUNDANT GRAM NEGATIVE RODS     Gram Stain Report Called to,Read Back By and Verified With: Gram Stain Report Called to,Read Back By and Verified WithOtho Darner RN PACU 2230 09/03/2011 BY Nelva Nay Performed by [facility]   Culture     Final    Value: NO ANAEROBES ISOLATED; CULTURE IN PROGRESS FOR 5 DAYS   Report Status PENDING   Incomplete   CULTURE, ROUTINE-ABSCESS     Status: Normal (Preliminary result)  Collection Time   09/03/11  6:33 PM      Component Value Range Status Comment   Specimen Description LEG   Final    Special Requests NONE   Final    Gram Stain     Final    Value: RARE WBC PRESENT,BOTH PMN AND MONONUCLEAR     ABUNDANT GRAM NEGATIVE RODS     Gram Stain Report Called to,Read Back By and Verified With: Gram Stain Report Called to,Read Back By and Verified With: Otho Darner RN PACU 2230 09/03/2011 BY R LINEBERRY Performed by [facility]   Culture Culture reincubated for better growth   Final    Report Status PENDING   Incomplete   URINE CULTURE     Status: Normal   Collection Time   09/03/11  9:08 PM      Component Value Range Status Comment   Specimen Description URINE, CATHETERIZED   Final    Special Requests NONE   Final    Culture  Setup Time [id number]   Final    Colony Count NO GROWTH   Final    Culture NO GROWTH   Final    Report Status 09/04/2011 FINAL   Final   RAPID STREP SCREEN     Status: Normal   Collection Time   09/04/11  2:25 PM      Component Value Range Status Comment   Streptococcus, Group A Screen (Direct) NEGATIVE   NEGATIVE  Final     Studies/Results: Dg Chest 2 View  09/03/2011  *RADIOLOGY REPORT*  Clinical Data: Left knee infection.  Hypertension.  Diabetes.  CHEST - 2 VIEW  Comparison: None.  Findings: Airspace opacity noted in the left chest, primarily involving the upper lobe but also possibly some of the lower lobe. On the lateral projection, I cannot exclude left hilar adenopathy. There is also a right middle lobe subsegmental atelectasis.  Cardiac and mediastinal contours appear unremarkable.  No pleural effusion identified.  IMPRESSION:  1.  Large opacity in the left chest, probably pneumonia involving the lingula and possibly left lower lobe.  There is possible left adenopathy.  Underlying malignancy is not excluded, and follow-up to ensure clearance, or chest CT, is recommended. 2.  Subsegmental atelectasis in the right middle lobe.  Original Report Authenticated By: Dellia Cloud, M.D.   Ct Chest W Contrast  09/04/2011  *RADIOLOGY REPORT*  Clinical Data: Evaluate lung opacity.  CT CHEST WITH CONTRAST  Technique:  Multidetector CT imaging of the chest was performed following the standard protocol during bolus administration of intravenous contrast.  Contrast: 80mL OMNIPAQUE IOHEXOL 300 MG/ML IJ SOLN  Comparison: Chest radiographs 09/03/2011.  Findings: There is dense ill-defined left perihilar airspace opacity involving the central portions of the left upper and lower lobes.  There are associated air bronchograms and extrinsic bronchial narrowing.  No endobronchial mass is demonstrated.  In the right lung, there is streaky atelectasis or scarring without air space disease.  There is a small (6 mm) subpleural nodular density in the right middle lobe.  There is no significant pleural or pericardial effusion.  Numerous prominent mediastinal lymph nodes are noted.  These include a high right paratracheal node measuring 14 mm on image 8, a 15 mm AP window node on image 17 and a 16 mm subcarinal node on  image 26. Contrast bolus is suboptimal, limiting hilar assessment.  Right arm PICC is in place. There is some coronary artery disease.  The visualized upper abdomen is notable  for mild hepatic steatosis and postsurgical changes in the anterior abdominal wall.  There is no adrenal mass.  IMPRESSION:  1.  Central airspace disease in the left lung is perihilar in distribution, involving the upper and lower lobes.  Appearance is nonspecific and could reflect atypical pneumonia.  However, infiltrating neoplasm cannot be excluded. 2.  Multiple mildly enlarged mediastinal lymph nodes are nonspecific and potentially reactive. 3.  No significant pleural effusion.  If there is clinical evidence of pneumonia, radiographic followup after appropriate therapy would be advised.  If there is clinical concern of neoplasm, bronchoscopy should be considered.  Original Report Authenticated By: Gerrianne Scale, M.D.    Medications:  I have reviewed the patient's current medications.   Infection of left knee  Continue with Vancomycin, cefepime. Per ortho management. Follow culture gram negative rods.   PNA (pneumonia)   Patient already on Vancomycin and Cefepime day 2 .  Blood culture, sputum culture. Legionella antigen pending. Strep antigen negative..  Ct chest atypical infection vs malignancy. Agree with treating for infection and repeat CT chest 1 month. Patient aware of results.   Hyponatremia (09/04/2011) Continue with IV fluids. Improved.  Leukocytosis (09/04/2011) Likely secondary to infection, Left knee and PNA.  WBC trending down.  Diabetes  Continue with SSI, linagliptin.  Hypertension  Hold lisinopril while patient has hyponatremia. Continue with metoprolol. Hold Norvasc SBP in the 100 range.  Hypokalemia: replaced.  Anemia; Continue with ferrous Sulfate.       LOS: 2 days   Raad Clayson M.D.  [facility] 09/05/2011, 12:18 PM

## 2011-09-06 ENCOUNTER — Other Ambulatory Visit: Payer: Self-pay

## 2011-09-06 LAB — BASIC METABOLIC PANEL
BUN: 12 mg/dL (ref 6–23)
BUN: 12 mg/dL (ref 6–23)
CO2: 21 mEq/L (ref 19–32)
CO2: 23 mEq/L (ref 19–32)
Calcium: 7.4 mg/dL — ABNORMAL LOW (ref 8.4–10.5)
GFR calc non Af Amer: >90 mL/min (ref >90)
GFR calc non Af Amer: 84 mL/min — ABNORMAL LOW (ref >90)
Glucose, Bld: 84 mg/dL (ref 70–99)
Glucose, Bld: 83 mg/dL (ref 70–99)
Potassium: 6 mEq/L — ABNORMAL HIGH (ref 3.5–5.1)
Potassium: 3.6 mEq/L (ref 3.5–5.1)
Sodium: 133 mEq/L — ABNORMAL LOW (ref 135–145)

## 2011-09-06 LAB — CBC
HCT: 24.8 % — ABNORMAL LOW (ref 39.0–52.0)
Hemoglobin: 8.4 g/dL — ABNORMAL LOW (ref 13.0–17.0)
MCH: 27.7 pg (ref 26.0–34.0)
MCHC: 33.9 g/dL (ref 30.0–36.0)
RBC: 3.03 MIL/uL — ABNORMAL LOW (ref 4.22–5.81)

## 2011-09-06 LAB — LEGIONELLA ANTIGEN, URINE: Legionella Antigen, Urine: NEGATIVE

## 2011-09-06 LAB — GLUCOSE, CAPILLARY

## 2011-09-06 LAB — PROTIME-INR: Prothrombin Time: 17 seconds — ABNORMAL HIGH (ref 11.6–15.2)

## 2011-09-06 MED ORDER — SODIUM POLYSTYRENE SULFONATE 15 GM/60ML PO SUSP
30.0000 g | Freq: Once | ORAL | Status: AC
  Administered 2011-09-06: 30 g via ORAL
  Filled 2011-09-06: qty 120

## 2011-09-06 MED ORDER — VANCOMYCIN HCL 1000 MG IV SOLR
1500.0000 mg | Freq: Two times a day (BID) | INTRAVENOUS | Status: DC
  Administered 2011-09-06 – 2011-09-07 (×3): 1500 mg via INTRAVENOUS
  Filled 2011-09-06 (×4): qty 1500

## 2011-09-06 MED ORDER — WARFARIN SODIUM 7.5 MG PO TABS
15.0000 mg | ORAL_TABLET | Freq: Once | ORAL | Status: AC
  Administered 2011-09-06: 15 mg via ORAL
  Filled 2011-09-06: qty 2

## 2011-09-06 NOTE — Progress Notes (Addendum)
Subjective: Patient feeling well. No chest pain or dyspnea.  Objective: Filed Vitals:   09/05/11 0602 09/05/11 1402 09/05/11 2130 09/06/11 0600  BP: 115/74 126/77 106/71 131/68  Pulse: 74 83 79 73  Temp: 97.8 F (36.6 C) 98.2 F (36.8 C) 99 F (37.2 C) 98.2 F (36.8 C)  TempSrc:  Oral Oral Oral  Resp: 18 18 18 16   Height:      Weight:      SpO2: 100% 97% 98% 98%   Weight change:   Intake/Output Summary (Last 24 hours) at 09/06/11 1236 Last data filed at 09/06/11 1223  Gross per 24 hour  Intake 3967.38 ml  Output   3500 ml  Net 467.38 ml    General: Alert, awake, oriented x3, in no acute distress.  HEENT: No bruits, no goiter.  Heart: Regular rate and rhythm, without murmurs, rubs, gallops.  Lungs: CTA, bilateral air movement.  Abdomen: Soft, nontender, nondistended, positive bowel sounds.  Neuro: Grossly intact, nonfocal. Extremities; left knee with dressing clean.   Lab Results:  [facility] 09/06/11 1127 09/05/11 0525  NA 134* 132*  K 6.0* 4.0  CL 108 100  CO2 21 24  GLUCOSE 84 97  BUN 12 19  CREATININE 0.87 1.05  CALCIUM 7.4* 8.5  MG -- 2.4  PHOS -- --   Basename 09/06/11 1127 09/05/11 0525 09/03/11 1800  WBC 15.1* 18.5* --  NEUTROABS -- -- 23.7*  HGB 8.4* 9.3* --  HCT 24.8* 27.4* --  MCV 81.8 82.3 --  PLT 499* 526* --    Basename 09/04/11 0405  HGBA1C 7.1*    Micro Results: Recent Results (from the past 240 hour(s))  SURGICAL PCR SCREEN     Status: Normal   Collection Time   09/03/11  5:53 PM      Component Value Range Status Comment   MRSA, PCR NEGATIVE  NEGATIVE  Final    Staphylococcus aureus NEGATIVE  NEGATIVE  Final   GRAM STAIN     Status: Normal   Collection Time   09/03/11  6:33 PM      Component Value Range Status Comment   Specimen Description LEG   Final    Special Requests NONE   Final    Gram Stain     Final    Value: ABUNDANT GRAM NEGATIVE RODS     RARE WBC PRESENT, PREDOMINANTLY PMN     RESULT READ BACK AND VERIFIED WITH B  Alejandro RN/PACU AT 2230 09/03/2011 BY R LINEBERRY   Report Status 09/03/2011 FINAL   Final   ANAEROBIC CULTURE     Status: Normal (Preliminary result)   Collection Time   09/03/11  6:33 PM      Component Value Range Status Comment   Specimen Description LEG   Final    Special Requests NONE   Final    Gram Stain     Final    Value: RARE WBC PRESENT,BOTH PMN AND MONONUCLEAR     ABUNDANT GRAM NEGATIVE RODS     Gram Stain Report Called to,Read Back By and Verified With: Gram Stain Report Called to,Read Back By and Verified WithOtho Darner RN PACU 2230 09/03/2011 BY Nelva Nay Performed by [facility]   Culture     Final    Value: NO ANAEROBES ISOLATED; CULTURE IN PROGRESS FOR 5 DAYS   Report Status PENDING   Incomplete   CULTURE, ROUTINE-ABSCESS     Status: Normal (Preliminary result)   Collection Time   09/03/11  6:33 PM      Component Value Range Status Comment   Specimen Description LEG   Final    Special Requests NONE   Final    Gram Stain     Final    Value: RARE WBC PRESENT,BOTH PMN AND MONONUCLEAR     ABUNDANT GRAM NEGATIVE RODS     Gram Stain Report Called to,Read Back By and Verified With: Gram Stain Report Called to,Read Back By and Verified With: Otho Darner RN PACU 2230 09/03/2011 BY R LINEBERRY Performed by [facility]   Culture FEW STREPTOCOCCUS SPECIES   Final    Report Status PENDING   Incomplete   URINE CULTURE     Status: Normal   Collection Time   09/03/11  9:08 PM      Component Value Range Status Comment   Specimen Description URINE, CATHETERIZED   Final    Special Requests NONE   Final    Culture  Setup Time [id number]   Final    Colony Count NO GROWTH   Final    Culture NO GROWTH   Final    Report Status 09/04/2011 FINAL   Final   RAPID STREP SCREEN     Status: Normal   Collection Time   09/04/11  2:25 PM      Component Value Range Status Comment   Streptococcus, Group A Screen (Direct) NEGATIVE  NEGATIVE  Final   CULTURE, BLOOD (ROUTINE X 2)      Status: Normal (Preliminary result)   Collection Time   09/04/11  2:35 PM      Component Value Range Status Comment   Specimen Description BLOOD RIGHT ARM   Final    Special Requests BOTTLES DRAWN AEROBIC AND ANAEROBIC 10CC EACH   Final    Culture  Setup Time [id number]   Final    Culture     Final    Value:        BLOOD CULTURE RECEIVED NO GROWTH TO DATE CULTURE WILL BE HELD FOR 5 DAYS BEFORE ISSUING A FINAL NEGATIVE REPORT   Report Status PENDING   Incomplete   CULTURE, BLOOD (ROUTINE X 2)     Status: Normal (Preliminary result)   Collection Time   09/04/11  2:45 PM      Component Value Range Status Comment   Specimen Description BLOOD RIGHT ARM   Final    Special Requests BOTTLES DRAWN AEROBIC ONLY 10CC EACH   Final    Culture  Setup Time [id number]   Final    Culture     Final    Value:        BLOOD CULTURE RECEIVED NO GROWTH TO DATE CULTURE WILL BE HELD FOR 5 DAYS BEFORE ISSUING A FINAL NEGATIVE REPORT   Report Status PENDING   Incomplete     Studies/Results: Ct Chest W Contrast  09/04/2011  *RADIOLOGY REPORT*  Clinical Data: Evaluate lung opacity.  CT CHEST WITH CONTRAST  Technique:  Multidetector CT imaging of the chest was performed following the standard protocol during bolus administration of intravenous contrast.  Contrast: 80mL OMNIPAQUE IOHEXOL 300 MG/ML IJ SOLN  Comparison: Chest radiographs 09/03/2011.  Findings: There is dense ill-defined left perihilar airspace opacity involving the central portions of the left upper and lower lobes.  There are associated air bronchograms and extrinsic bronchial narrowing.  No endobronchial mass is demonstrated.  In the right lung, there is streaky atelectasis or scarring without air space disease.  There is a small (6  mm) subpleural nodular density in the right middle lobe.  There is no significant pleural or pericardial effusion.  Numerous prominent mediastinal lymph nodes are noted.  These include a high right paratracheal node measuring 14  mm on image 8, a 15 mm AP window node on image 17 and a 16 mm subcarinal node on image 26. Contrast bolus is suboptimal, limiting hilar assessment.  Right arm PICC is in place. There is some coronary artery disease.  The visualized upper abdomen is notable for mild hepatic steatosis and postsurgical changes in the anterior abdominal wall.  There is no adrenal mass.  IMPRESSION:  1.  Central airspace disease in the left lung is perihilar in distribution, involving the upper and lower lobes.  Appearance is nonspecific and could reflect atypical pneumonia.  However, infiltrating neoplasm cannot be excluded. 2.  Multiple mildly enlarged mediastinal lymph nodes are nonspecific and potentially reactive. 3.  No significant pleural effusion.  If there is clinical evidence of pneumonia, radiographic followup after appropriate therapy would be advised.  If there is clinical concern of neoplasm, bronchoscopy should be considered.  Original Report Authenticated By: Gerrianne Scale, M.D.    Medications: I have reviewed the patient's current medications.   PNA (pneumonia)  Patient already on Vancomycin and Cefepime day 3 .  Blood culture, sputum culture. Legionella antigen pending.  Strep antigen negative..  Ct chest atypical infection vs malignancy. Agree with treating for infection and repeat CT chest 1 month. Patient aware of results.   Hyponatremia (09/04/2011) Improved.  Leukocytosis (09/04/2011) Likely secondary to infection, Left knee and PNA.  WBC trending down.  Diabetes  Continue with SSI, linagliptin.  Hypertension  Hold lisinopril while patient has hyponatremia. Continue with metoprolol. Hold Norvasc SBP in the 100 range.  Please discharge off lisinopril.  Hypokalemia: replaced.  Anemia; Continue with ferrous Sulfate.  Hyperkalemia; Check EKG. Kayexalate. Repeat B-met this afternoon. Stop fluids with KCL.  Agree with discharge tomorrow if K level norma. Discharge of lisinopril.      LOS: 3  days   REGALADO,BELKYS M.D.  [facility] 09/06/2011, 12:36 PM  Repeated K is 3.8. EKG no peak T wave. DC kayexalate.

## 2011-09-06 NOTE — Progress Notes (Signed)
Per pt choice Brent Ortiz to provide [facility] services. Brent Ortiz contacted concerning pt's request for wheelchair. DME delivered to pt's room 09/06/11. Awaiting rx for home ABX.  [name] [phone]

## 2011-09-06 NOTE — Progress Notes (Signed)
ANTIBIOTIC CONSULT NOTE - Follow up  Pharmacy Consult for Vancomycin and Zosyn Indication: Prosthetic knee infection and r/o Pneumonia  Allergies  Allergen Reactions  . Motrin (Ibuprofen) Rash    Stomach irritation    Patient Measurements: Height: 5\' 8"  (172.7 cm) Weight: 302 lb (136.986 kg) IBW/kg (Calculated) : 68.4   Vital Signs: Temp: 98.2 F (36.8 C) (03/03 0600) Temp src: Oral (03/03 0600) BP: 131/68 mmHg (03/03 0600) Pulse Rate: 73  (03/03 0600)  Labs:  Basename 09/05/11 0525 09/04/11 0405 09/03/11 1800  WBC 18.5* 24.2* 26.4*  HGB 9.3* 9.8* 10.6*  PLT 526* 481* 538*  LABCREA -- -- --  CREATININE 1.05 0.79 0.94   Estimated Creatinine Clearance: 110.2 ml/min (by C-G formula based on Cr of 1.05).  Basename 09/06/11 0410  VANCOTROUGH 12.3  VANCOPEAK --  Drue Dun --  GENTTROUGH --  GENTPEAK --  GENTRANDOM --  TOBRATROUGH --  Nolen Mu --  TOBRARND --  AMIKACINPEAK --  AMIKACINTROU --  AMIKACIN --     Medications:  Anti-infectives     Start     Dose/Rate Route Frequency Ordered Stop   09/04/11 1500   ceFEPIme (MAXIPIME) 1 g in dextrose 5 % 50 mL IVPB        1 g 100 mL/hr over 30 Minutes Intravenous Every 12 hours 09/04/11 1412     09/04/11 0600   vancomycin (VANCOCIN) 1,250 mg in sodium chloride 0.9 % 250 mL IVPB        1,250 mg 166.7 mL/hr over 90 Minutes Intravenous Every 12 hours 09/04/11 0144     09/04/11 0200   rifampin (RIFADIN) capsule 600 mg  Status:  Discontinued        600 mg Oral Every 12 hours 09/04/11 0100 09/04/11 1412   09/04/11 0200   piperacillin-tazobactam (ZOSYN) IVPB 3.375 g  Status:  Discontinued        3.375 g 12.5 mL/hr over 240 Minutes Intravenous Every 8 hours 09/04/11 0113 09/04/11 1412   09/03/11 2138   vancomycin (VANCOCIN) powder  Status:  Discontinued          As needed 09/03/11 2138 09/03/11 2229   09/03/11 2137   tobramycin (NEBCIN) powder  Status:  Discontinued          As needed 09/03/11 2138 09/03/11 2229     09/03/11 1746   ceFAZolin (ANCEF) IVPB 2 g/50 mL premix        2 g 100 mL/hr over 30 Minutes Intravenous 60 min pre-op 09/03/11 1746 09/03/11 2028         Assessment:  51 yo M admit with 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 Measure antibiotic drug levels at steady state Follow up culture results, renal function, and labs as available.    Lynann Beaver PharmD  Pager [phone] 09/06/2011 7:22 AM

## 2011-09-06 NOTE — Progress Notes (Signed)
Physical Therapy Treatment Patient Details Name: Brent Ortiz MRN: [id number] DOB: 1961-03-08 Today's Date: 09/06/2011  PT Assessment/Plan  PT - Assessment/Plan Comments on Treatment Session: doing well, feeling better, wants to nap this morning PT Plan: Discharge plan remains appropriate;Frequency remains appropriate PT Frequency: 7X/week Follow Up Recommendations: Home health PT Equipment Recommended: Wheelchair (measurements);3 in 1 bedside comode (pt requests) PT Goals  Acute Rehab PT Goals Pt will go Supine/Side to Sit: with supervision;with HOB 0 degrees PT Goal: Supine/Side to Sit - Progress: Progressing toward goal Pt will go Sit to Supine/Side: with supervision PT Goal: Sit to Supine/Side - Progress: Progressing toward goal Pt will go Sit to Stand: with supervision PT Goal: Sit to Stand - Progress: Progressing toward goal Pt will Ambulate: 51 - 150 feet;with supervision;with rolling walker PT Goal: Ambulate - Progress: Progressing toward goal Pt will Perform Home Exercise Program: with supervision, verbal cues required/provided PT Goal: Perform Home Exercise Program - Progress: Progressing toward goal  PT Treatment Precautions/Restrictions  Precautions Precautions: Knee Required Braces or Orthoses: Yes Knee Immobilizer: Discontinue once straight leg raise with < 10 degree lag Restrictions Weight Bearing Restrictions: No LLE Weight Bearing: Weight bearing as tolerated Mobility (including Balance) Bed Mobility Supine to Sit: 4: Min assist;HOB elevated (Comment degrees);With rails Supine to Sit Details (indicate cue type and reason): min cues not to hold breath Sit to Supine: 4: Min assist;HOB flat Sit to Supine - Details (indicate cue type and reason): min assist with LLE Transfers Sit to Stand: 4: Min assist;From bed;From elevated surface;With upper extremity assist Sit to Stand Details (indicate cue type and reason): vc to push from bed. Stand to Sit: 4: Min  assist;To bed;With upper extremity assist Stand to Sit Details: vc to reach back, step LLE forward Ambulation/Gait Ambulation/Gait Assistance: 4: Min assist;5: Supervision Ambulation/Gait Assistance Details (indicate cue type and reason): verbal cues RW position, breathing Ambulation Distance (Feet): 180 Feet Assistive device: Rolling walker Gait Pattern: Step-through pattern  Posture/Postural Control Posture/Postural Control: No significant limitations Exercise  Total Joint Exercises Ankle Circles/Pumps: AROM;Left;10 reps;Supine Quad Sets: AROM;Left;10 reps;Supine Hip ABduction/ADduction: AAROM;Left;10 reps;Supine Straight Leg Raises: AAROM;Left;10 reps;Supine End of Session PT - End of Session Activity Tolerance: Patient tolerated treatment well Patient left: in bed;with call bell in reach Nurse Communication: Mobility status for transfers General Behavior During Session: [facility] for tasks performed Cognition: [facility] for tasks performed  [facility] 09/06/2011, 10:32 AM

## 2011-09-06 NOTE — Progress Notes (Signed)
Subjective: Pt with ekg being placed for his hyperkalemia   Antibiotics:  Anti-infectives     Start     Dose/Rate Route Frequency Ordered Stop   09/06/11 1800   vancomycin (VANCOCIN) 1,500 mg in sodium chloride 0.9 % 500 mL IVPB        1,500 mg 250 mL/hr over 120 Minutes Intravenous Every 12 hours 09/06/11 0725     09/04/11 1500   ceFEPIme (MAXIPIME) 1 g in dextrose 5 % 50 mL IVPB        1 g 100 mL/hr over 30 Minutes Intravenous Every 12 hours 09/04/11 1412     09/04/11 0600   vancomycin (VANCOCIN) 1,250 mg in sodium chloride 0.9 % 250 mL IVPB  Status:  Discontinued        1,250 mg 166.7 mL/hr over 90 Minutes Intravenous Every 12 hours 09/04/11 0144 09/06/11 0725   09/04/11 0200   rifampin (RIFADIN) capsule 600 mg  Status:  Discontinued        600 mg Oral Every 12 hours 09/04/11 0100 09/04/11 1412   09/04/11 0200   piperacillin-tazobactam (ZOSYN) IVPB 3.375 g  Status:  Discontinued        3.375 g 12.5 mL/hr over 240 Minutes Intravenous Every 8 hours 09/04/11 0113 09/04/11 1412   09/03/11 2138   vancomycin (VANCOCIN) powder  Status:  Discontinued          As needed 09/03/11 2138 09/03/11 2229   09/03/11 2137   tobramycin (NEBCIN) powder  Status:  Discontinued          As needed 09/03/11 2138 09/03/11 2229   09/03/11 1746   ceFAZolin (ANCEF) IVPB 2 g/50 mL premix        2 g 100 mL/hr over 30 Minutes Intravenous 60 min pre-op 09/03/11 1746 09/03/11 2028          Medications: Scheduled Meds:    . acetaminophen  1,000 mg Oral Q8H  . aspirin EC  81 mg Oral Daily  . atorvastatin  20 mg Oral q1800  . ceFEPime (MAXIPIME) IV  1 g Intravenous Q12H  . docusate sodium  100 mg Oral BID  . ferrous sulfate  325 mg Oral TID PC  . insulin aspart  0-20 Units Subcutaneous TID WC  . linagliptin  5 mg Oral Daily  . metoprolol  200 mg Oral Daily  . pantoprazole  40 mg Oral Q1200  . polyethylene glycol  17 g Oral BID  . sodium chloride  10-40 mL Intracatheter Q12H  . sodium  polystyrene  30 g Oral Once  . vancomycin  1,500 mg Intravenous Q12H  . warfarin  10 mg Oral ONCE-1800  . warfarin  15 mg Oral ONCE-1800  . Warfarin - Pharmacist Dosing Inpatient   Does not apply q1800  . DISCONTD: vancomycin  1,250 mg Intravenous Q12H   Continuous Infusions:    . DISCONTD: 0.9 % NaCl with KCl 20 mEq / L 100 mL/hr at 09/06/11 0103   PRN Meds:.acetaminophen, acetaminophen, HYDROmorphone, menthol-cetylpyridinium, methocarbamol(ROBAXIN) IV, methocarbamol, metoCLOPramide (REGLAN) injection, metoCLOPramide, nitroGLYCERIN, ondansetron (ZOFRAN) IV, ondansetron, oxyCODONE, phenol, sodium chloride   Objective: Weight change:   Intake/Output Summary (Last 24 hours) at 09/06/11 1255 Last data filed at 09/06/11 1223  Gross per 24 hour  Intake 3967.38 ml  Output   3500 ml  Net 467.38 ml   Blood pressure 131/68, pulse 73, temperature 98.2 F (36.8 C), temperature source Oral, resp. rate 16, height 5\' 8"  (1.727 m), weight 302 lb (136.986  kg), SpO2 98.00%. Temp:  [98.2 F (36.8 C)-99 F (37.2 C)] 98.2 F (36.8 C) (03/03 0600) Pulse Rate:  [73-83] 73  (03/03 0600) Resp:  [16-18] 16  (03/03 0600) BP: (106-131)/(68-77) 131/68 mmHg (03/03 0600) SpO2:  [97 %-98 %] 98 % (03/03 0600)  Physical Exam: General: Alert and awake, oriented x3, not in any acute distress. HEENT: EOMI  CVS regular rate, normal r,  no murmur rubs or gallops Chest: diminished bs at bases Abdomen: soft nontender, nondistended, normal bowel sounds, Extremities:ndressing and drain in place Skin: no rashes Lymph: no new lymphadenopathy Neuro: nonfocal  Lab Results:  Basename 09/06/11 1127 09/05/11 0525  WBC 15.1* 18.5*  HGB 8.4* 9.3*  HCT 24.8* 27.4*  PLT 499* 526*    BMET  Basename 09/06/11 1127 09/05/11 0525  NA 134* 132*  K 6.0* 4.0  CL 108 100  CO2 21 24  GLUCOSE 84 97  BUN 12 19  CREATININE 0.87 1.05  CALCIUM 7.4* 8.5    Micro Results: Recent Results (from the past 240 hour(s))    SURGICAL PCR SCREEN     Status: Normal   Collection Time   09/03/11  5:53 PM      Component Value Range Status Comment   MRSA, PCR NEGATIVE  NEGATIVE  Final    Staphylococcus aureus NEGATIVE  NEGATIVE  Final   GRAM STAIN     Status: Normal   Collection Time   09/03/11  6:33 PM      Component Value Range Status Comment   Specimen Description LEG   Final    Special Requests NONE   Final    Gram Stain     Final    Value: ABUNDANT GRAM NEGATIVE RODS     RARE WBC PRESENT, PREDOMINANTLY PMN     RESULT READ BACK AND VERIFIED WITH B Grayson RN/PACU AT 2230 09/03/2011 BY R LINEBERRY   Report Status 09/03/2011 FINAL   Final   ANAEROBIC CULTURE     Status: Normal (Preliminary result)   Collection Time   09/03/11  6:33 PM      Component Value Range Status Comment   Specimen Description LEG   Final    Special Requests NONE   Final    Gram Stain     Final    Value: RARE WBC PRESENT,BOTH PMN AND MONONUCLEAR     ABUNDANT GRAM NEGATIVE RODS     Gram Stain Report Called to,Read Back By and Verified With: Gram Stain Report Called to,Read Back By and Verified WithOtho Darner RN PACU 2230 09/03/2011 BY R LINEBERRY Performed by [facility]   Culture     Final    Value: NO ANAEROBES ISOLATED; CULTURE IN PROGRESS FOR 5 DAYS   Report Status PENDING   Incomplete   CULTURE, ROUTINE-ABSCESS     Status: Normal (Preliminary result)   Collection Time   09/03/11  6:33 PM      Component Value Range Status Comment   Specimen Description LEG   Final    Special Requests NONE   Final    Gram Stain     Final    Value: RARE WBC PRESENT,BOTH PMN AND MONONUCLEAR     ABUNDANT GRAM NEGATIVE RODS     Gram Stain Report Called to,Read Back By and Verified With: Gram Stain Report Called to,Read Back By and Verified WithOtho Darner RN PACU 2230 09/03/2011 BY Nelva Nay Performed by [facility]   Culture FEW STREPTOCOCCUS SPECIES  Final    Report Status PENDING   Incomplete   URINE CULTURE     Status: Normal    Collection Time   09/03/11  9:08 PM      Component Value Range Status Comment   Specimen Description URINE, CATHETERIZED   Final    Special Requests NONE   Final    Culture  Setup Time [id number]   Final    Colony Count NO GROWTH   Final    Culture NO GROWTH   Final    Report Status 09/04/2011 FINAL   Final   RAPID STREP SCREEN     Status: Normal   Collection Time   09/04/11  2:25 PM      Component Value Range Status Comment   Streptococcus, Group A Screen (Direct) NEGATIVE  NEGATIVE  Final   CULTURE, BLOOD (ROUTINE X 2)     Status: Normal (Preliminary result)   Collection Time   09/04/11  2:35 PM      Component Value Range Status Comment   Specimen Description BLOOD RIGHT ARM   Final    Special Requests BOTTLES DRAWN AEROBIC AND ANAEROBIC 10CC EACH   Final    Culture  Setup Time [id number]   Final    Culture     Final    Value:        BLOOD CULTURE RECEIVED NO GROWTH TO DATE CULTURE WILL BE HELD FOR 5 DAYS BEFORE ISSUING A FINAL NEGATIVE REPORT   Report Status PENDING   Incomplete   CULTURE, BLOOD (ROUTINE X 2)     Status: Normal (Preliminary result)   Collection Time   09/04/11  2:45 PM      Component Value Range Status Comment   Specimen Description BLOOD RIGHT ARM   Final    Special Requests BOTTLES DRAWN AEROBIC ONLY 10CC EACH   Final    Culture  Setup Time [id number]   Final    Culture     Final    Value:        BLOOD CULTURE RECEIVED NO GROWTH TO DATE CULTURE WILL BE HELD FOR 5 DAYS BEFORE ISSUING A FINAL NEGATIVE REPORT   Report Status PENDING   Incomplete     Studies/Results: Ct Chest W Contrast  09/04/2011  *RADIOLOGY REPORT*  Clinical Data: Evaluate lung opacity.  CT CHEST WITH CONTRAST  Technique:  Multidetector CT imaging of the chest was performed following the standard protocol during bolus administration of intravenous contrast.  Contrast: 80mL OMNIPAQUE IOHEXOL 300 MG/ML IJ SOLN  Comparison: Chest radiographs 09/03/2011.  Findings: There is dense ill-defined  left perihilar airspace opacity involving the central portions of the left upper and lower lobes.  There are associated air bronchograms and extrinsic bronchial narrowing.  No endobronchial mass is demonstrated.  In the right lung, there is streaky atelectasis or scarring without air space disease.  There is a small (6 mm) subpleural nodular density in the right middle lobe.  There is no significant pleural or pericardial effusion.  Numerous prominent mediastinal lymph nodes are noted.  These include a high right paratracheal node measuring 14 mm on image 8, a 15 mm AP window node on image 17 and a 16 mm subcarinal node on image 26. Contrast bolus is suboptimal, limiting hilar assessment.  Right arm PICC is in place. There is some coronary artery disease.  The visualized upper abdomen is notable for mild hepatic steatosis and postsurgical changes in the anterior abdominal wall.  There is no adrenal  mass.  IMPRESSION:  1.  Central airspace disease in the left lung is perihilar in distribution, involving the upper and lower lobes.  Appearance is nonspecific and could reflect atypical pneumonia.  However, infiltrating neoplasm cannot be excluded. 2.  Multiple mildly enlarged mediastinal lymph nodes are nonspecific and potentially reactive. 3.  No significant pleural effusion.  If there is clinical evidence of pneumonia, radiographic followup after appropriate therapy would be advised.  If there is clinical concern of neoplasm, bronchoscopy should be considered.  Original Report Authenticated By: Gerrianne Scale, M.D.      Assessment/Plan: Brent Ortiz is a 51 y.o. 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 Pneumonia: --would give him 7 days of therapy with vancomycin (along with Cefepime --He should have  A repeat CT scan done in another month to make sure these infiltrates are resolving and if they are nto I would get a bronchoscopy and biopsies to evaluate for malignancy   LOS: 3 days   Acey Lav 09/06/2011, 12:55 PM

## 2011-09-06 NOTE — Progress Notes (Signed)
ANTICOAGULATION CONSULT NOTE - Follow Up Consult  Pharmacy Consult for Warfarin Indication: VTE prophylaxis  Allergies  Allergen Reactions  . Motrin (Ibuprofen) Rash    Stomach irritation    Patient Measurements: Height: 5\' 8"  (172.7 cm) Weight: 302 lb (136.986 kg) IBW/kg (Calculated) : 68.4    Vital Signs: Temp: 98.2 F (36.8 C) (03/03 0600) Temp src: Oral (03/03 0600) BP: 131/68 mmHg (03/03 0600) Pulse Rate: 73  (03/03 0600)  Labs:  Basename 09/06/11 0410 09/05/11 0525 09/04/11 0405 09/03/11 1800  HGB -- 9.3* 9.8* --  HCT -- 27.4* 28.0* 30.2*  PLT -- 526* 481* 538*  APTT -- -- -- 38*  LABPROT 17.0* 16.5* 15.5* --  INR 1.36 1.31 1.20 --  HEPARINUNFRC -- -- -- --  CREATININE -- 1.05 0.79 0.94  CKTOTAL -- -- -- --  CKMB -- -- -- --  TROPONINI -- -- -- --   Estimated Creatinine Clearance: 110.2 ml/min (by C-G formula based on Cr of 1.05).   Medications:  Scheduled:     . acetaminophen  1,000 mg Oral Q8H  . aspirin EC  81 mg Oral Daily  . atorvastatin  20 mg Oral q1800  . ceFEPime (MAXIPIME) IV  1 g Intravenous Q12H  . docusate sodium  100 mg Oral BID  . ferrous sulfate  325 mg Oral TID PC  . insulin aspart  0-20 Units Subcutaneous TID WC  . linagliptin  5 mg Oral Daily  . metoprolol  200 mg Oral Daily  . pantoprazole  40 mg Oral Q1200  . polyethylene glycol  17 g Oral BID  . sodium chloride  10-40 mL Intracatheter Q12H  . vancomycin  1,500 mg Intravenous Q12H  . warfarin  10 mg Oral ONCE-1800  . Warfarin - Pharmacist Dosing Inpatient   Does not apply q1800  . DISCONTD: vancomycin  1,250 mg Intravenous Q12H   Anticoag doses: Warfarin 10mg  on 3/1  Assessment:  40 YOM s/p TKA 09/2010, with revision 06/2011 now with infected knee s/p I&D with exchange 09/03/11  Of note, patient stopped taking Effient (prasugrel) 1 week post op which he states he started about 9 months ago s/p MI.  He thinks the plan is to no longer take this due to cost, and continue  warfarin indefinitely.  He was strongly encouraged to confirm the plan with his physician.  No longer has drug-drug interaction with rifampin  INR subtherapeutic, but increasing very slowly  No complications/bleeding reported  Education started 3/1  Goal of Therapy:  INR 2-3   Plan:   Warfarin 15 mg PO today at 1800  Daily INR, CBC  Re-visit patient to address further questions about warfarin eduction.    Lynann Beaver PharmD  Pager [phone] 09/06/2011 7:27 AM

## 2011-09-06 NOTE — Progress Notes (Signed)
Brent Ortiz  MRN: [id number] DOB/Age: Jun 15, 1961 51 y.o. Physician: Jacquelyne Balint Procedure: Procedure(s) (LRB): IRRIGATION AND DEBRIDEMENT KNEE WITH POLY EXCHANGE (Left)     Subjective: Up in chair. Minimal complaints.Less pain. Voiding well +BM.  Vital Signs Temp:  [98.2 F (36.8 C)-99 F (37.2 C)] 98.2 F (36.8 C) (03/03 0600) Pulse Rate:  [73-83] 73  (03/03 0600) Resp:  [16-18] 16  (03/03 0600) BP: (106-131)/(68-77) 131/68 mmHg (03/03 0600) SpO2:  [97 %-98 %] 98 % (03/03 0600)  Lab Results  Basename 09/05/11 0525 09/04/11 0405  WBC 18.5* 24.2*  HGB 9.3* 9.8*  HCT 27.4* 28.0*  PLT 526* 481*   BMET  Basename 09/05/11 0525 09/04/11 0405  NA 132* 126*  K 4.0 3.3*  CL 100 90*  CO2 24 27  GLUCOSE 97 149*  BUN 19 19  CREATININE 1.05 0.79  CALCIUM 8.5 8.3*   INR  Date Value Range Status  09/06/2011 1.36  0.00-1.49 (no units) Final     Exam Left knee dressing changed. Less swelling as return of skin creases noted. No active drainage. NVI        Plan Cont IV Abx. Ongoing care per Dr. Charlann Boxer and ID  [facility] for Dr.Kevin Supple 09/06/2011, 8:59 AM

## 2011-09-06 NOTE — Progress Notes (Signed)
PT Cancellation Note  Pm treatment cancelled today due to pt sleeping upon first attempt in pm, returned and pt stated in too much pain, RN aware.  [facility] 09/06/2011, 4:48 PM

## 2011-09-07 ENCOUNTER — Encounter (HOSPITAL_COMMUNITY): Payer: Self-pay | Admitting: Orthopedic Surgery

## 2011-09-07 LAB — GLUCOSE, CAPILLARY
Glucose-Capillary: 100 mg/dL — ABNORMAL HIGH (ref 70–99)
Glucose-Capillary: 103 mg/dL — ABNORMAL HIGH (ref 70–99)
Glucose-Capillary: 144 mg/dL — ABNORMAL HIGH (ref 70–99)

## 2011-09-07 LAB — GRAM STAIN

## 2011-09-07 LAB — BASIC METABOLIC PANEL
BUN: 12 mg/dL (ref 6–23)
CO2: 23 mEq/L (ref 19–32)
Chloride: 99 mEq/L (ref 96–112)
Glucose, Bld: 82 mg/dL (ref 70–99)
Potassium: 4 mEq/L (ref 3.5–5.1)

## 2011-09-07 LAB — CULTURE, ROUTINE-ABSCESS

## 2011-09-07 LAB — PROTIME-INR
INR: 1.81 — ABNORMAL HIGH (ref 0.00–1.49)
Prothrombin Time: 21.3 s — ABNORMAL HIGH (ref 11.6–15.2)

## 2011-09-07 LAB — EXPECTORATED SPUTUM ASSESSMENT W GRAM STAIN, RFLX TO RESP C: Special Requests: NORMAL

## 2011-09-07 MED ORDER — OXYCODONE HCL 5 MG PO TABS: 5.0000 mg | ORAL_TABLET | ORAL | Status: AC | PRN

## 2011-09-07 MED ORDER — DEXTROSE 5 % IV SOLN: 2.0000 g | INTRAVENOUS | Status: AC

## 2011-09-07 MED ORDER — VANCOMYCIN HCL 1000 MG IV SOLR: 1500.0000 mg | Freq: Two times a day (BID) | INTRAVENOUS | Status: DC

## 2011-09-07 MED ORDER — WARFARIN VIDEO
Freq: Once | Status: AC
  Administered 2011-09-07: 15:00:00

## 2011-09-07 MED ORDER — DEXTROSE 5 % IV SOLN: 2.0000 g | INTRAVENOUS | Status: DC

## 2011-09-07 MED ORDER — DSS 100 MG PO CAPS: 100.0000 mg | ORAL_CAPSULE | Freq: Two times a day (BID) | ORAL | Status: AC

## 2011-09-07 MED ORDER — WARFARIN SODIUM 10 MG PO TABS: 10.0000 mg | ORAL_TABLET | Freq: Once | ORAL | Status: DC

## 2011-09-07 MED ORDER — METHOCARBAMOL 500 MG PO TABS: 500.0000 mg | ORAL_TABLET | Freq: Four times a day (QID) | ORAL | Status: AC | PRN

## 2011-09-07 MED ORDER — DEXTROSE 5 % IV SOLN: 1.0000 g | Freq: Two times a day (BID) | INTRAVENOUS | Status: AC

## 2011-09-07 MED ORDER — POLYETHYLENE GLYCOL 3350 17 G PO PACK: 17.0000 g | PACK | Freq: Two times a day (BID) | ORAL | Status: AC

## 2011-09-07 MED ORDER — WARFARIN SODIUM 10 MG PO TABS
10.0000 mg | ORAL_TABLET | Freq: Once | ORAL | Status: AC
  Administered 2011-09-07: 10 mg via ORAL
  Filled 2011-09-07: qty 1

## 2011-09-07 MED ORDER — DEXTROSE 5 % IV SOLN: 1.0000 g | Freq: Two times a day (BID) | INTRAVENOUS | Status: DC

## 2011-09-07 MED FILL — Mupirocin Oint 2%: CUTANEOUS | Qty: 22 | Status: AC

## 2011-09-07 NOTE — Progress Notes (Signed)
   CARE MANAGEMENT NOTE 09/07/2011  Patient:  Brent Ortiz, Brent Ortiz   Account Number:  [id number]  Date Initiated:  09/04/2011  Documentation initiated by:  Colleen Can  Subjective/Objective Assessment:   dx left infected knee; I&D left knee with polehtylene exchange revision with placemnt of stimulant beads     Action/Plan:   CM spoke with patient. Pt plans to go bck to his home in [place] where his spouse will be caregiver. States he already has RW, 3N1, cane. pt wants to use [place] for hh services.   Anticipated DC Date:  09/06/2011   Anticipated DC Plan:  HOME W HOME HEALTH SERVICES  In-house referral  NA      DC Planning Services  CM consult      [facility] Choice  HOME HEALTH   Choice offered to / List presented to:  C-1 Patient   DME arranged  NA      DME agency  NA     HH arranged  HH-1 RN  HH-2 PT      [facility] agency  [facility]   Status of service:  Completed, signed off Medicare Important Message given?  NO (If response is "NO", the following Medicare IM given date fields will be blank) Date Medicare IM given:   Date Additional Medicare IM given:    Discharge Disposition:  HOME W HOME HEALTH SERVICES  Per UR Regulation:    Comments:  09/07/2011 Raynelle Bring BSN CCM [phone] Pt for discharge today with [facility] services of [facility] in place. Pt to receive home Iv antibiotics per ordr per order of ID. Genevieve Norlander is aware will arrange for home IV antibiotics. Pt to receive PM does before discharge today. Genevieve Norlander will start services tomorrow 09/08/2011.

## 2011-09-07 NOTE — Progress Notes (Signed)
Patient ID: Brent Ortiz, male   DOB: Oct 04, 1960, 51 y.o.   MRN: [id number] Subjective: 4 Days Post-Op Procedure(s) (LRB): IRRIGATION AND DEBRIDEMENT KNEE WITH POLY EXCHANGE (Left)    Patient reports pain as mild.  Objective:   VITALS:   Filed Vitals:   09/07/11 0530  BP: 137/82  Pulse: 73  Temp: 98.6 F (37 C)  Resp: 18    Neurovascular intact Incision: dressing C/D/I  LABS  Basename 09/06/11 1127 09/05/11 0525  HGB 8.4* 9.3*  HCT 24.8* 27.4*  WBC 15.1* 18.5*  PLT 499* 526*     Basename 09/07/11 0500 09/06/11 1840 09/06/11 1301 09/06/11 1127  NA 132* 133* -- 134*  K 4.0 3.6 3.8 --  BUN 12 12 -- 12  CREATININE 0.97 1.00 -- 0.87  GLUCOSE 82 83 -- 84     Basename 09/07/11 0500 09/06/11 0410  LABPT -- --  INR 1.81* 1.36     Assessment/Plan: 4 Days Post-Op Procedure(s) (LRB): IRRIGATION AND DEBRIDEMENT KNEE WITH POLY EXCHANGE (Left)   Up with therapy Continue ABX therapy due to Post-op infection, 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

## 2011-09-07 NOTE — Progress Notes (Signed)
Patient ID: Brent Ortiz, male   DOB: Dec 17, 1960, 51 y.o.   MRN: [id number]  INFECTIOUS DISEASE PROGRESS NOTE    Date of Admission:  09/03/2011           Day 5 vancomycin        Day 5 cefepime Active Problems:  Infection of prosthetic left knee joint  PNA (pneumonia)  Hyponatremia  Leukocytosis      . acetaminophen  1,000 mg Oral Q8H  . aspirin EC  81 mg Oral Daily  . atorvastatin  20 mg Oral q1800  . ceFEPime (MAXIPIME) IV  1 g Intravenous Q12H  . docusate sodium  100 mg Oral BID  . ferrous sulfate  325 mg Oral TID PC  . insulin aspart  0-20 Units Subcutaneous TID WC  . linagliptin  5 mg Oral Daily  . metoprolol  200 mg Oral Daily  . pantoprazole  40 mg Oral Q1200  . polyethylene glycol  17 g Oral BID  . sodium chloride  10-40 mL Intracatheter Q12H  . vancomycin  1,500 mg Intravenous Q12H  . warfarin  10 mg Oral ONCE-1800  . warfarin  15 mg Oral ONCE-1800  . warfarin   Does not apply Once  . Warfarin - Pharmacist Dosing Inpatient   Does not apply q1800    Subjective: He is feeling much better. He has minimal cough which is about at his baseline. He has been able to ambulate with minimal pain in his knee.  Objective: Temp:  [98.6 F (37 C)-98.7 F (37.1 C)] 98.6 F (37 C) (03/04 0530) Pulse Rate:  [73-76] 73  (03/04 0530) Resp:  [18] 18  (03/04 0530) BP: (129-137)/(76-82) 137/82 mmHg (03/04 0530) SpO2:  [95 %] 95 % (03/04 0530)  General: he appears comfortable Skin: right arm PICC site appears normal Lungs: clear Cor: regular S1 and S2 no murmurs   Lab Results Lab Results  Component Value Date   WBC 15.1* 09/06/2011   HGB 8.4* 09/06/2011   HCT 24.8* 09/06/2011   MCV 81.8 09/06/2011   PLT 499* 09/06/2011    Lab Results  Component Value Date   CREATININE 0.97 09/07/2011   BUN 12 09/07/2011   NA 132* 09/07/2011   K 4.0 09/07/2011   CL 99 09/07/2011   CO2 23 09/07/2011    Lab Results  Component Value Date   ALT 15 06/17/2011   AST 14 06/17/2011   ALKPHOS 66  06/17/2011   BILITOT 0.6 06/17/2011       Microbiology: Recent Results (from the past 240 hour(s))  SURGICAL PCR SCREEN     Status: Normal   Collection Time   09/03/11  5:53 PM      Component Value Range Status Comment   MRSA, PCR NEGATIVE  NEGATIVE  Final    Staphylococcus aureus NEGATIVE  NEGATIVE  Final   GRAM STAIN     Status: Normal   Collection Time   09/03/11  6:33 PM      Component Value Range Status Comment   Specimen Description LEG   Final    Special Requests NONE   Final    Gram Stain     Final    Value: ABUNDANT GRAM NEGATIVE RODS     RARE WBC PRESENT, PREDOMINANTLY PMN     RESULT READ BACK AND VERIFIED WITH B Travin RN/PACU AT 2230 09/03/2011 BY R LINEBERRY     MODERATE GRAM POSITIVE COCCI IN PAIRS     CORRECTED REPORT CALLED TO  BENFIELD,L. RN AT [phone] 09/07/11 BARFIELD,T   Report Status 09/03/2011 FINAL   Final   ANAEROBIC CULTURE     Status: Normal (Preliminary result)   Collection Time   09/03/11  6:33 PM      Component Value Range Status Comment   Specimen Description LEG   Final    Special Requests NONE   Final    Gram Stain     Final    Value: RARE WBC PRESENT,BOTH PMN AND MONONUCLEAR     ABUNDANT GRAM NEGATIVE RODS     Gram Stain Report Called to,Read Back By and Verified With: Gram Stain Report Called to,Read Back By and Verified With: Otho Darner RN PACU 2230 09/03/2011 BY R LINEBERRY Performed by [facility]   Culture     Final    Value: NO ANAEROBES ISOLATED; CULTURE IN PROGRESS FOR 5 DAYS   Report Status PENDING   Incomplete   CULTURE, ROUTINE-ABSCESS     Status: Normal   Collection Time   09/03/11  6:33 PM      Component Value Range Status Comment   Specimen Description LEG   Final    Special Requests NONE   Final    Gram Stain     Final    Value: RARE WBC PRESENT,BOTH PMN AND MONONUCLEAR     MODERATE GRAM POSITIVE COCCI IN PAIRS     SMITH 09/07/11 0730 BY SMITHERSJ PREVIOUSLY REPORTED AS ABUNDANT GRAM NEGATIVE RODS CORRECTED RESULTS CALLED TO:  SHEILA TROLLINGER Gram Stain Report Called to,Read Back By and Verified With: Gram Stain Report Called to,Read Back By and Verified WithOtho Darner RN PACU 2230 09/03/2011 BY Nelva Nay Performed by [facility]   Culture FEW STREPTOCOCCUS PNEUMONIAE   Final    Report Status 09/07/2011 FINAL   Final    Organism ID, Bacteria STREPTOCOCCUS PNEUMONIAE   Final   URINE CULTURE     Status: Normal   Collection Time   09/03/11  9:08 PM      Component Value Range Status Comment   Specimen Description URINE, CATHETERIZED   Final    Special Requests NONE   Final    Culture  Setup Time [id number]   Final    Colony Count NO GROWTH   Final    Culture NO GROWTH   Final    Report Status 09/04/2011 FINAL   Final   RAPID STREP SCREEN     Status: Normal   Collection Time   09/04/11  2:25 PM      Component Value Range Status Comment   Streptococcus, Group A Screen (Direct) NEGATIVE  NEGATIVE  Final   CULTURE, BLOOD (ROUTINE X 2)     Status: Normal (Preliminary result)   Collection Time   09/04/11  2:35 PM      Component Value Range Status Comment   Specimen Description BLOOD RIGHT ARM   Final    Special Requests BOTTLES DRAWN AEROBIC AND ANAEROBIC 10CC EACH   Final    Culture  Setup Time [id number]   Final    Culture     Final    Value:        BLOOD CULTURE RECEIVED NO GROWTH TO DATE CULTURE WILL BE HELD FOR 5 DAYS BEFORE ISSUING A FINAL NEGATIVE REPORT   Report Status PENDING   Incomplete   CULTURE, BLOOD (ROUTINE X 2)     Status: Normal (Preliminary result)   Collection Time   09/04/11  2:45 PM      Component Value Range Status Comment   Specimen Description BLOOD RIGHT ARM   Final    Special Requests BOTTLES DRAWN AEROBIC ONLY 10CC EACH   Final    Culture  Setup Time [id number]   Final    Culture     Final    Value:        BLOOD CULTURE RECEIVED NO GROWTH TO DATE CULTURE WILL BE HELD FOR 5 DAYS BEFORE ISSUING A FINAL NEGATIVE REPORT   Report Status PENDING   Incomplete   CULTURE,  SPUTUM-ASSESSMENT     Status: Normal   Collection Time   09/07/11 12:55 AM      Component Value Range Status Comment   Specimen Description SPUTUM   Final    Special Requests Normal   Final    Sputum evaluation     Final    Value: MICROSCOPIC FINDINGS SUGGEST THAT THIS SPECIMEN IS NOT REPRESENTATIVE OF LOWER RESPIRATORY SECRETIONS. PLEASE RECOLLECT.     CALLED TO [facility] RN/6E BY R LINEBERRY 0140 09/07/2011   Report Status 09/07/2011 FINAL   Final     Studies/Results: No results found.   Assessment: His initial Gram stain was corrected and rather than gram-negative rods shows gram-positive cocci and the cultures are growing pneumococcus from his left knee operative specimens. His chest CT scan shows a perihilar her left mid lung masslike infiltrate. It is unclear if this represents pneumonia or tumor. I will give him another 2 days of broad empiric therapy for possible HCAP and then switch to once daily ceftriaxone for continued therapy of his prosthetic joint infection. He will need a minimum of 6 weeks of IV antibiotic therapy, then consideration of switch to an oral regimen for 3-6 months total. He will also probably need a followup chest CT scan.  Plan: 1. Continue vancomycin and cefepime for 2 more days then switched to ceftriaxone 2 g daily 2. I will arrange followup in the [facility] for Infectious Diseases in about 3 weeks   Cliffton Asters, MD [facility] for Infectious Diseases [facility] [phone] pager   [phone] cell 09/07/2011, 2:51 PM

## 2011-09-07 NOTE — Progress Notes (Signed)
ANTICOAGULATION CONSULT NOTE - Follow Up Consult  Pharmacy Consult for Warfarin Indication: VTE prophylaxis  Allergies  Allergen Reactions  . Motrin (Ibuprofen) Rash    Stomach irritation    Patient Measurements: Height: 5\' 8"  (172.7 cm) Weight: 302 lb (136.986 kg) IBW/kg (Calculated) : 68.4    Vital Signs: Temp: 98.6 F (37 C) (03/04 0530) Temp src: Oral (03/04 0530) BP: 137/82 mmHg (03/04 0530) Pulse Rate: 73  (03/04 0530)  Labs:  Basename 09/07/11 0500 09/06/11 1840 09/06/11 1127 09/06/11 0410 09/05/11 0525  HGB -- -- 8.4* -- 9.3*  HCT -- -- 24.8* -- 27.4*  PLT -- -- 499* -- 526*  APTT -- -- -- -- --  LABPROT 21.3* -- -- 17.0* 16.5*  INR 1.81* -- -- 1.36 1.31  HEPARINUNFRC -- -- -- -- --  CREATININE 0.97 1.00 0.87 -- --  CKTOTAL -- -- -- -- --  CKMB -- -- -- -- --  TROPONINI -- -- -- -- --   Estimated Creatinine Clearance: 119.3 ml/min (by C-G formula based on Cr of 0.97).   Medications:  Scheduled:     . acetaminophen  1,000 mg Oral Q8H  . aspirin EC  81 mg Oral Daily  . atorvastatin  20 mg Oral q1800  . ceFEPime (MAXIPIME) IV  1 g Intravenous Q12H  . docusate sodium  100 mg Oral BID  . ferrous sulfate  325 mg Oral TID PC  . insulin aspart  0-20 Units Subcutaneous TID WC  . linagliptin  5 mg Oral Daily  . metoprolol  200 mg Oral Daily  . pantoprazole  40 mg Oral Q1200  . polyethylene glycol  17 g Oral BID  . sodium chloride  10-40 mL Intracatheter Q12H  . sodium polystyrene  30 g Oral Once  . vancomycin  1,500 mg Intravenous Q12H  . warfarin  15 mg Oral ONCE-1800  . Warfarin - Pharmacist Dosing Inpatient   Does not apply q1800    Assessment:  56 YOM s/p TKA 09/2010, with revision 06/2011 now with infected knee s/p I&D with exchange 09/03/11  Of note, patient stopped taking Effient (prasugrel) 1 week post op which he states he started about 9 months ago s/p MI.  He thinks the plan is to no longer take this due to cost, and continue warfarin  indefinitely.  He was strongly encouraged to confirm the plan with his physician.  No longer has drug-drug interaction with rifampin  INR initially responding slowly, no responding  No complications/bleeding reported  Education started 3/1  Goal of Therapy:  INR 2-3   Plan:   Warfarin 10mg  today  Daily INR, CBC  Re-visit patient to address further questions about warfarin eduction.  Loralee Pacas, PharmD, BCPS Pager: [phone] 09/07/2011 8:22 AM

## 2011-09-07 NOTE — Progress Notes (Signed)
D/c instructions given and explained to pt.  Prescriptions for coumadin, oxycodone and robaxin given to pt.  Pt still to receive one more antibiotic before d/c home.  Informed night shift of this.  piccline will need to be flushed and capped off before d/c.  Night shift nurse is aware.  Pt stable and ready for d/c.

## 2011-09-07 NOTE — Progress Notes (Signed)
Physical Therapy Treatment Patient Details Name: Brent Ortiz MRN: [id number] DOB: 1961/01/20 Today's Date: 09/07/2011 [id number] PT Assessment/Plan  PT - Assessment/Plan Comments on Treatment Session: pt improving. waiting for DC after antibx decision made. pt asking for lift chair, not WC. [facility] aware to return WC. PT Plan: Discharge plan remains appropriate;Frequency remains appropriate Follow Up Recommendations: Home health PT Equipment Recommended:  (pt does not need WC) PT Goals  Acute Rehab PT Goals Pt will go Sit to Supine/Side: with supervision PT Goal: Sit to Supine/Side - Progress: Met Pt will go Sit to Stand: with supervision PT Goal: Sit to Stand - Progress: Progressing toward goal Pt will Ambulate: 51 - 150 feet;with supervision;with rolling walker PT Goal: Ambulate - Progress: Progressing toward goal PT Goal: Up/Down Stairs - Progress: Discontinued (comment) (pt declined need.)  PT Treatment Precautions/Restrictions  Precautions Precautions: Knee Required Braces or Orthoses: Yes Knee Immobilizer: Discontinue once straight leg raise with < 10 degree lag Restrictions Weight Bearing Restrictions: No LLE Weight Bearing: Weight bearing as tolerated Mobility (including Balance) Bed Mobility Sit to Supine: HOB flat;5: Supervision Sit to Supine - Details (indicate cue type and reason): pt able to place LLE onto bed Transfers Transfers: Yes Sit to Stand: 5: Supervision;From chair/3-in-1;With armrests Stand to Sit: 5: Supervision;To bed Ambulation/Gait Ambulation/Gait Assistance: 5: Supervision Ambulation Distance (Feet): 150 Feet Assistive device: Rolling walker Gait Pattern: Step-to pattern Stairs:  (pt declined need to practice  as he has done before.)    Exercise    End of Session    [name] 09/07/2011, 2:08 PM

## 2011-09-08 LAB — ANAEROBIC CULTURE

## 2011-09-10 LAB — CULTURE, BLOOD (ROUTINE X 2)
Culture  Setup Time: 201303012255
Culture: NO GROWTH

## 2011-09-10 NOTE — Discharge Summary (Signed)
Physician Discharge Summary  Patient ID: Brent Ortiz MRN: [id number] DOB/AGE: Oct 14, 1960 51 y.o.  Admit date: 09/03/2011 Discharge date: 09/07/2011  Procedures:  Procedure(s) (LRB): IRRIGATION AND DEBRIDEMENT KNEE WITH POLY EXCHANGE (Left)  Attending Physician: Dr. Durene Romans  Admission Diagnoses: Left knee pain and swelling  Discharge Diagnoses:  Active Problems:  PNA (pneumonia)  Infection of prosthetic left knee joint  Hyponatremia  Leukocytosis Hyperlipidemia  Gout   Osteoarthritis   HTN  Coronary artery disease   Myocardial infarction - 04-13-2011   Diabetes mellitus  HPI: Pt is a 51 y.o. male complaining of left knee pain and swelling for 4 days. Pt had a previous left TKA in March 2012 and a subsequent revision in December of 2012. Pt has been doing well since the revision until suddenly having pain in the left knee on "Sunday. It has been progressively increasing since that point. He returned to the clinic where the knee was aspirated, revealing questionable infection of the left knee joint fluid. Various options are discussed with the patient. Risks, benefits and expectations were discussed with the patient. Patient understand the risks, benefits and expectations and wishes to proceed with surgery.   PCP: No primary provider on file.   Discharged Condition: good  Hospital Course:  Patient underwent the above stated procedure on 09/03/2011. Patient tolerated the procedure well and brought to the recovery room in good condition and subsequently to the floor.  POD #1 BP: 101/67 ; Pulse: 69 ; Temp: 97.5 F (36.4 C) ; Resp: 18  Pt's foley was removed, as well as the hemovac drain removed. IV was changed to a saline lock. Patient reports pain as mild.Much better after having his knee washed out, feels like a lot less pressure. No events throughout the night. Patient did state that prior to admission that he has had different color to his urine for about 1 week.  LABS    Basename  09/04/11 0405   HGB  9.8  HCT  28.0   WBC        24.2  POD #2  BP: 115/74 ; Pulse: 74 ; Temp: 97.8 F (36.6 C) ; Resp: 18  Patient reports pain as mild. Again, feeling better everyday and especially better than prior to being in the hospital. No events. Neurovascular intact, dorsiflexion/plantar flexion intact, incision: dressing C/Ortiz/I, no cellulitis present and compartment soft.   LABS  Basename  09/05/11 0525   HGB  9.3  HCT  27.4   WBC        18" .5  POD #3  BP: 131/68 ; Pulse: 73 ; Temp: 98.2 F (36.8 C) ; Resp: 16 Up in chair. Minimal complaints.Less pain. Voiding well +BM. Left knee dressing changed. Less swelling as return of skin creases noted. No active drainage. NVI  LABS   No new labs  POD #4 BP: 137/82 ; Pulse: 73 ; Temp: 98.6 F (37 C) ; Resp: 18  Neurovascular intact and incision: dressing C/Ortiz/I. Up with therapy. Continue ABX therapy due to Post-op infection, known septic left TKRevision due to pneumococcal infection. Discharge home with home health.  LABS   No new labs   Discharge Exam: General appearance: alert, cooperative and no distress Extremities: Homans sign is negative, no sign of DVT, no edema, redness or tenderness in the calves or thighs and no ulcers, gangrene or trophic changes  Disposition: Home-Health Care Svc with follow up in 2 weeks  Follow-up Information    Follow up with OLIN,Brent Ortiz in 2  weeks.   Contact information:   [facility] [address] [place] [address] [phone]          Discharge Orders    Future Orders Please Complete By Expires   Diet - low sodium heart healthy      Call MD / Call 911      Comments:   If you experience chest pain or shortness of breath, CALL 911 and be transported to the hospital emergency room.  If you develope a fever above 101 F, pus (white drainage) or increased drainage or redness at the wound, or calf pain, call your surgeon's  office.   Discharge instructions      Comments:   Daily dressing changes with sterile 4 x 4 inch gauze dressing and tape. Keep the area dry and clean. Follow up in 2 weeks at [facility]. Call with any questions or concerns.  Also follow up with [facility] in 3 weeks.     Constipation Prevention      Comments:   Drink plenty of fluids.  Prune juice may be helpful.  You may use a stool softener, such as Colace (over the counter) 100 mg twice a day.  Use MiraLax (over the counter) for constipation as needed.   Increase activity slowly as tolerated      Weight Bearing as taught in Physical Therapy      Comments:   Use a walker or crutches as instructed.   Driving restrictions      Comments:   No driving for 4 weeks   TED hose      Comments:   Use stockings (TED hose) for 2 weeks on both leg(s).  You may remove them at night for sleeping.   Change dressing      Comments:   Daily dressing changes with sterile 4 x 4 inch gauze dressing and tape. Keep the area dry and clean.      Discharge Medication List as of 09/07/2011  6:15 PM    START taking these medications   Details  docusate sodium 100 MG CAPS Take 100 mg by mouth 2 (two) times daily., Starting 09/07/2011, Until Thu 09/17/11, No Print    methocarbamol (ROBAXIN) 500 MG tablet Take 1 tablet (500 mg total) by mouth every 6 (six) hours as needed (muscle spasms)., Starting 09/07/2011, Until Thu 09/17/11, Print    oxyCODONE (OXY IR/ROXICODONE) 5 MG immediate release tablet Take 1-2 tablets (5-10 mg total) by mouth every 4 (four) hours as needed., Starting 09/07/2011, Until Thu 09/17/11, Print    polyethylene glycol (MIRALAX / GLYCOLAX) packet Take 17 g by mouth 2 (two) times daily., Starting 09/07/2011, Until Thu 09/10/11, No Print    warfarin (COUMADIN) 10 MG tablet Take 1 tablet (10 mg total) by mouth one time only at 6 PM., Starting 09/07/2011, Until Tue 09/06/12, Print      CONTINUE these medications  which have CHANGED   Details  dextrose 5 % SOLN 50 mL with ceFEPIme 1 G SOLR 1 g Inject 1 g into the vein every 12 (twelve) hours., Starting 09/07/2011, Until Thu 09/10/11, Print    dextrose 5 % SOLN 50 mL with cefTRIAXone 2 G SOLR 2 g Inject 2 g into the vein daily., Starting 09/07/2011, Until Wed 10/14/11, Print    sodium chloride 0.9 % SOLN 500 mL with vancomycin 1000 MG SOLR 1,500 mg Inject 1,500 mg into the vein every 12 (twelve) hours., Starting 09/07/2011, Until  Discontinued, Print      CONTINUE these medications which have NOT CHANGED   Details  acetaminophen (TYLENOL) 500 MG tablet Take 1,000 mg by mouth every 6 (six) hours as needed. For pain., Until Discontinued, Historical Med    amLODipine (NORVASC) 2.5 MG tablet Take 2.5 mg by mouth every morning. , Until Discontinued, Historical Med    ferrous sulfate 325 (65 FE) MG tablet Take 1 tablet (325 mg total) by mouth 3 (three) times daily after meals., Starting 06/25/2011, Until Fri 06/24/12, No Print    lisinopril (PRINIVIL,ZESTRIL) 10 MG tablet Take 10 mg by mouth every morning. , Until Discontinued, Historical Med    metoprolol (TOPROL-XL) 200 MG 24 hr tablet Take 200 mg by mouth every morning. , Until Discontinued, Historical Med    nitroGLYCERIN (NITROSTAT) 0.4 MG SL tablet Place 0.4 mg under the tongue every 5 (five) minutes as needed. For chest pain, Until Discontinued, Historical Med    pantoprazole (PROTONIX) 40 MG tablet Take 40 mg by mouth every morning. , Until Discontinued, Historical Med    rosuvastatin (CRESTOR) 20 MG tablet Take 20 mg by mouth daily. , Until Discontinued, Historical Med    sitaGLIPtin (JANUVIA) 100 MG tablet Take 100 mg by mouth every morning. , Until Discontinued, Historical Med      STOP taking these medications     aspirin EC 81 MG tablet Comments:  Reason for Stopping:       oxyCODONE-acetaminophen (PERCOCET) 10-325 MG per tablet Comments:  Reason for Stopping:       prasugrel (EFFIENT) 10 MG  TABS Comments:  Reason for Stopping:          Signed: [name]. Noeh Sparacino   PAC  09/10/2011, 5:46 PM

## 2011-09-14 NOTE — Anesthesia Postprocedure Evaluation (Signed)
  Anesthesia Post-op Note  Patient: Brent Ortiz  Procedure(s) Performed: Procedure(s) (LRB): IRRIGATION AND DEBRIDEMENT KNEE WITH POLY EXCHANGE (Left)  Patient Location: PACU  Anesthesia Type: General  Level of Consciousness: oriented and sedated  Airway and Oxygen Therapy: Patient Spontanous Breathing and Patient connected to nasal cannula oxygen  Post-op Pain: mild  Post-op Assessment: Post-op Vital signs reviewed, Patient's Cardiovascular Status Stable, Respiratory Function Stable and Patent Airway  Post-op Vital Signs: stable  Complications: No apparent anesthesia complications

## 2011-09-29 ENCOUNTER — Encounter: Payer: Self-pay | Admitting: Infectious Disease

## 2011-09-29 ENCOUNTER — Ambulatory Visit (INDEPENDENT_AMBULATORY_CARE_PROVIDER_SITE_OTHER): Payer: Medicaid Other | Admitting: Infectious Disease

## 2011-09-29 VITALS — BP 117/76 | HR 94 | Temp 98.1°F | Ht 68.0 in | Wt 288.0 lb

## 2011-09-29 DIAGNOSIS — Z96659 Presence of unspecified artificial knee joint: Secondary | ICD-10-CM

## 2011-09-29 DIAGNOSIS — D473 Essential (hemorrhagic) thrombocythemia: Secondary | ICD-10-CM | POA: Insufficient documentation

## 2011-09-29 DIAGNOSIS — A491 Streptococcal infection, unspecified site: Secondary | ICD-10-CM | POA: Insufficient documentation

## 2011-09-29 DIAGNOSIS — D75839 Thrombocytosis, unspecified: Secondary | ICD-10-CM | POA: Insufficient documentation

## 2011-09-29 DIAGNOSIS — T8454XA Infection and inflammatory reaction due to internal left knee prosthesis, initial encounter: Secondary | ICD-10-CM

## 2011-09-29 DIAGNOSIS — B953 Streptococcus pneumoniae as the cause of diseases classified elsewhere: Secondary | ICD-10-CM

## 2011-09-29 DIAGNOSIS — T8450XA Infection and inflammatory reaction due to unspecified internal joint prosthesis, initial encounter: Secondary | ICD-10-CM

## 2011-09-29 HISTORY — DX: Thrombocytosis, unspecified: D75.839

## 2011-09-29 HISTORY — DX: Essential (hemorrhagic) thrombocythemia: D47.3

## 2011-09-29 MED ORDER — AMOXICILLIN 500 MG PO CAPS: 500.0000 mg | ORAL_CAPSULE | Freq: Three times a day (TID) | ORAL | Status: AC

## 2011-09-29 NOTE — Progress Notes (Signed)
Subjective:    Patient ID: Brent Ortiz, male    DOB: 04-10-1961, 51 y.o.   MRN: [id number]  HPI  51 year old man with prosthetic knee admitted initially with what was thought to be HCAp, found to have prosthetic knee infection with pneumococcus (PCN Sensitive), narrowed to rocephin. He had exchange arthroplasty on 09/03/11 by Dr. Charlann Boxer. He had felt well until this past weekend when he was bearing weight quite a bit while trying to help his family in recovery from loss of his brother who just died of lymphoma. I have recent labs from home infusion co and they show wbc of 11.9 and PLt of 881. Serum Cr is normal. He is without fevers, chills or malise but his knee has been hurting more and is more swollen than it was when he left the hospital.  Review of Systems  Constitutional: Negative for fever, chills, diaphoresis, activity change, appetite change, fatigue and unexpected weight change.  HENT: Negative for congestion, sore throat, rhinorrhea, sneezing, trouble swallowing and sinus pressure.   Eyes: Negative for photophobia and visual disturbance.  Respiratory: Negative for cough, chest tightness, shortness of breath, wheezing and stridor.   Cardiovascular: Negative for chest pain, palpitations and leg swelling.  Gastrointestinal: Negative for nausea, vomiting, abdominal pain, diarrhea, constipation, blood in stool, abdominal distention and anal bleeding.  Genitourinary: Negative for dysuria, hematuria, flank pain and difficulty urinating.  Musculoskeletal: Negative for myalgias, back pain, joint swelling, arthralgias and gait problem.  Skin: Negative for color change, pallor, rash and wound.  Neurological: Negative for dizziness, tremors, weakness and light-headedness.  Hematological: Negative for adenopathy. Does not bruise/bleed easily.  Psychiatric/Behavioral: Negative for behavioral problems, confusion, sleep disturbance, dysphoric mood, decreased concentration and agitation.         Objective:   Physical Exam  Constitutional: He is oriented to person, place, and time. He appears well-developed and well-nourished. No distress.  HENT:  Head: Normocephalic and atraumatic.  Mouth/Throat: Oropharynx is clear and moist. No oropharyngeal exudate.  Eyes: Conjunctivae and EOM are normal. Pupils are equal, round, and reactive to light. No scleral icterus.  Neck: Normal range of motion. Neck supple. No JVD present.  Cardiovascular: Normal rate, regular rhythm and normal heart sounds.  Exam reveals no gallop and no friction rub.   No murmur heard. Pulmonary/Chest: Effort normal and breath sounds normal. No respiratory distress. He has no wheezes. He has no rales. He exhibits no tenderness.  Abdominal: He exhibits no distension and no mass. There is no tenderness. There is no rebound and no guarding.  Musculoskeletal: He exhibits no edema and no tenderness.       Left knee: He exhibits swelling and effusion.       Legs: Lymphadenopathy:    He has no cervical adenopathy.  Neurological: He is alert and oriented to person, place, and time. He has normal reflexes. He exhibits normal muscle tone. Coordination normal.  Skin: Skin is warm and dry. He is not diaphoretic. No erythema. No pallor.  Psychiatric: He has a normal mood and affect. His behavior is normal. Judgment and thought content normal.          Assessment & Plan:  Infection of prosthetic left knee joint Check esr, crp. Plan on 6 weeks postoperative IV abx followed by oral amoxcillin  Thrombocytosis Not clear why his platelets were so high on Monday's draw. Recheck today. Could be harbinger or returning infection. Drug effect?  Streptococcus pneumoniae infection Wonder if he had pneumonia that then went  on to bacteremia and seeded knee? Unusual pathogen for septic prosthetic knee

## 2011-09-29 NOTE — Patient Instructions (Signed)
We will draw blood on you today  Please continue your IV antibiotics  When they finish I want you to start oral amoxicillin 500mg  tid ONCE THE IV ANTIBIOTICS HAVE STOPPED  RTC IN 1 MONTH

## 2011-09-29 NOTE — Assessment & Plan Note (Signed)
Check esr, crp. Plan on 6 weeks postoperative IV abx followed by oral amoxcillin

## 2011-09-29 NOTE — Assessment & Plan Note (Signed)
Wonder if he had pneumonia that then went on to bacteremia and seeded knee? Unusual pathogen for septic prosthetic knee

## 2011-09-29 NOTE — Assessment & Plan Note (Signed)
Not clear why his platelets were so high on Monday's draw. Recheck today. Could be harbinger or returning infection. Drug effect?

## 2011-09-30 LAB — COMPLETE METABOLIC PANEL WITH GFR
AST: 18 U/L (ref 0–37)
Albumin: 3.9 g/dL (ref 3.5–5.2)
Alkaline Phosphatase: 110 U/L (ref 39–117)
BUN: 12 mg/dL (ref 6–23)
Calcium: 9.1 mg/dL (ref 8.4–10.5)
Chloride: 104 mEq/L (ref 96–112)
GFR, Est Non African American: >89 mL/min
Glucose, Bld: 90 mg/dL (ref 70–99)
Potassium: 4.3 mEq/L (ref 3.5–5.3)
Sodium: 139 mEq/L (ref 135–145)
Total Protein: 7.3 g/dL (ref 6.0–8.3)

## 2011-09-30 LAB — C-REACTIVE PROTEIN: CRP: 3.83 mg/dL — ABNORMAL HIGH (ref <0.60)

## 2011-09-30 LAB — CBC WITH DIFFERENTIAL/PLATELET
Basophils Absolute: 0.1 10*3/uL (ref 0.0–0.1)
Basophils Relative: 0 % (ref 0–1)
Eosinophils Relative: 1 % (ref 0–5)
HCT: 35.5 % — ABNORMAL LOW (ref 39.0–52.0)
MCHC: 30.4 g/dL (ref 30.0–36.0)
MCV: 86.6 fL (ref 78.0–100.0)
Monocytes Absolute: 0.8 10*3/uL (ref 0.1–1.0)
Platelets: 472 10*3/uL — ABNORMAL HIGH (ref 150–400)
RDW: 15.4 % (ref 11.5–15.5)
WBC: 11.1 10*3/uL — ABNORMAL HIGH (ref 4.0–10.5)

## 2011-10-11 ENCOUNTER — Emergency Department (HOSPITAL_COMMUNITY)
Admission: EM | Admit: 2011-10-11 | Discharge: 2011-10-11 | Disposition: A | Discharge status: Home / Self Care | Payer: Medicaid Other | Attending: Emergency Medicine | Admitting: Emergency Medicine

## 2011-10-11 ENCOUNTER — Emergency Department (HOSPITAL_COMMUNITY): Payer: Medicaid Other

## 2011-10-11 ENCOUNTER — Encounter (HOSPITAL_COMMUNITY): Payer: Self-pay | Admitting: *Deleted

## 2011-10-11 DIAGNOSIS — E119 Type 2 diabetes mellitus without complications: Secondary | ICD-10-CM | POA: Insufficient documentation

## 2011-10-11 DIAGNOSIS — Y849 Medical procedure, unspecified as the cause of abnormal reaction of the patient, or of later complication, without mention of misadventure at the time of the procedure: Secondary | ICD-10-CM | POA: Insufficient documentation

## 2011-10-11 DIAGNOSIS — E785 Hyperlipidemia, unspecified: Secondary | ICD-10-CM | POA: Insufficient documentation

## 2011-10-11 DIAGNOSIS — I10 Essential (primary) hypertension: Secondary | ICD-10-CM | POA: Insufficient documentation

## 2011-10-11 DIAGNOSIS — Z452 Encounter for adjustment and management of vascular access device: Secondary | ICD-10-CM

## 2011-10-11 DIAGNOSIS — T82898A Other specified complication of vascular prosthetic devices, implants and grafts, initial encounter: Secondary | ICD-10-CM | POA: Insufficient documentation

## 2011-10-11 DIAGNOSIS — Z79899 Other long term (current) drug therapy: Secondary | ICD-10-CM | POA: Insufficient documentation

## 2011-10-11 DIAGNOSIS — Y92009 Unspecified place in unspecified non-institutional (private) residence as the place of occurrence of the external cause: Secondary | ICD-10-CM | POA: Insufficient documentation

## 2011-10-11 MED ORDER — LIDOCAINE HCL 1 % IJ SOLN
INTRAMUSCULAR | Status: AC
  Administered 2011-10-11: 4.2 mL
  Filled 2011-10-11: qty 20

## 2011-10-11 MED ORDER — CEFTRIAXONE SODIUM 1 G IJ SOLR
2.0000 g | Freq: Once | INTRAMUSCULAR | Status: AC
  Administered 2011-10-11: 2 g via INTRAMUSCULAR
  Filled 2011-10-11: qty 20

## 2011-10-11 NOTE — ED Provider Notes (Signed)
History     CSN: [id number]  Arrival date & time 10/11/11  0744   First MD Initiated Contact with Patient 10/11/11 [phone]      Chief Complaint  Patient presents with  . Vascular Access Problem     The history is provided by the patient.   the patient reports he has a PICC line in his right upper arm that he has been receiving antibiotics and for a knee infection as well as pneumonia.  He didn't receive any IV antibiotics at home but he called his nurse yesterday because it appeared as though the PICC line retracted some of his right arm.  The home health nurse evaluated at that time and flush through it and the patient reported he felt a cold sensation and discomfort in his right upper arm.  He was told to no longer injecting in medicines food and to come to the ER for evaluation.  The patient is without fevers or chills.  He has no other complaints.  He reports no pain at this time his right upper extremity.  Past Medical History  Diagnosis Date  . HLD (hyperlipidemia)   . Gout   . Osteoarthritis   . HTN (hypertension)   . Coronary artery disease   . Myocardial infarction     04-13-2011  . Diabetes mellitus     niddm  . Poor dentition     Past Surgical History  Procedure Date  . Total knee arthroplasty  right  feb 2012    left march 2012 r  . Hernia repair 2009  . Total knee revision 06/23/2011    Procedure: TOTAL KNEE REVISION;  Surgeon: Shelda Pal;  Location: [facility];  Service: Orthopedics;  Laterality: Left;  femomal nerve block done in holding area without incident  . I&d knee with poly exchange 09/03/2011    Procedure: IRRIGATION AND DEBRIDEMENT KNEE WITH POLY EXCHANGE;  Surgeon: Shelda Pal, MD;  Location: [facility];  Service: Orthopedics;  Laterality: Left;    Family History  Problem Relation Age of Onset  . Colon cancer      History  Substance Use Topics  . Smoking status: Former Smoker -- 1.0 packs/day for 37 years    Types: Cigarettes    Quit date:  04/13/2011  . Smokeless tobacco: Never Used  . Alcohol Use: No      Review of Systems  All other systems reviewed and are negative.    Allergies  Motrin  Home Medications   Current Outpatient Rx  Name Route Sig Dispense Refill  . ACETAMINOPHEN 500 MG PO TABS Oral Take 1,000 mg by mouth every 6 (six) hours as needed. For pain.    Marland Kitchen AMLODIPINE BESYLATE 2.5 MG PO TABS Oral Take 2.5 mg by mouth every morning.     Marland Kitchen CEFTRIAXONE 1-2 GM IVPB Intravenous Inject 2 g into the vein daily. 74 g 0  . FERROUS SULFATE 325 (65 FE) MG PO TABS Oral Take 1 tablet (325 mg total) by mouth 3 (three) times daily after meals.    Marland Kitchen LISINOPRIL 10 MG PO TABS Oral Take 10 mg by mouth every morning.     Marland Kitchen METOPROLOL SUCCINATE ER 200 MG PO TB24 Oral Take 200 mg by mouth every morning.     Marland Kitchen NITROGLYCERIN 0.4 MG SL SUBL Sublingual Place 0.4 mg under the tongue every 5 (five) minutes as needed. For chest pain    . PANTOPRAZOLE SODIUM 40 MG PO TBEC Oral Take 40 mg  by mouth every morning.     Marland Kitchen ROSUVASTATIN CALCIUM 20 MG PO TABS Oral Take 20 mg by mouth daily.     Marland Kitchen SITAGLIPTIN PHOSPHATE 100 MG PO TABS Oral Take 100 mg by mouth every morning.     Marland Kitchen VANCOMYCIN 1500 MG IVPB Intravenous Inject 1,500 mg into the vein every 12 (twelve) hours. 6000 mg 0  . WARFARIN SODIUM 10 MG PO TABS Oral Take 1 tablet (10 mg total) by mouth one time only at 6 PM. 30 tablet 0    To be monitored by [facility] and adjust per pharmacy to ...    BP 122/84  Pulse 88  Temp(Src) 98.5 F (36.9 C) (Oral)  Resp 20  SpO2 100%  Physical Exam  Constitutional: He is oriented to person, place, and time. He appears well-developed and well-nourished.  HENT:  Head: Normocephalic.  Eyes: EOM are normal.  Neck: Normal range of motion.  Pulmonary/Chest: Effort normal.  Musculoskeletal: Normal range of motion.       PICC line is placed in the right upper arm and does appear to be retracted some.  There is no secondary signs of infection surrounding  this  Neurological: He is alert and oriented to person, place, and time.  Psychiatric: He has a normal mood and affect.    ED Course  Procedures (including critical care time)  Labs Reviewed - No data to display Dg Chest 1 View  10/11/2011  *RADIOLOGY REPORT*  Clinical Data: Evaluate PICC line placement  CHEST - 1 VIEW  Comparison: 09/04/2011  Findings: The right arm PICC line is identified.  The tip appears to be within the right axillary vein.  The heart size appears normal.  There is atelectasis identified within the left midlung and left base.  Improving left lung consolidation.  IMPRESSION:  1.  Right arm PICC line tip appears to be within the right axillary vein.  This should be repositioned with tip in the SVC and oriented towards the right atrium.  Original Report Authenticated By: Rosealee Albee, M.D.     1. Needs peripherally inserted central catheter (PICC)       MDM  The patient reports he had pain with injection of his right arm PICC at home and thus a chest x-ray will be obtained to evaluate placement.   10:31 AM Interventional radiology will call the pt in the AM for PICC placement. Dr Fredia Sorrow asked me to keep the current line as he may rewire this. He is receiving IV ceftriaxone for infected knee prosthesis. IM rocephin in the ER now. Next dose due tomorrow at noon. Pt agrees to outpatient plan        Lyanne Co, MD 10/11/11 1035

## 2011-10-11 NOTE — ED Notes (Signed)
Pt states he has had a picc line for 5 wks and picc line is now beginning to come out.  Picc is secured with tegaderm but is coiled underneath of dressing.  Has 1 more wk of abx but was told by his home health nurse not to put any abx in picc until placement is confirmed to be right.

## 2011-10-11 NOTE — ED Notes (Signed)
Per pt reports had PICC line for over a week. Pt noticed yesterday that the PICC line looked like it was hanging out more than previously. Pt called home health nurse and was told to come to ED for evaluation of PICC line placement.  Pt uses PICC line for antibiotics to treat left knee infection and PNA.  Pt reports last used PICC line yesterday at noon.

## 2011-10-12 ENCOUNTER — Other Ambulatory Visit (HOSPITAL_COMMUNITY): Payer: Self-pay | Admitting: Interventional Radiology

## 2011-10-12 ENCOUNTER — Ambulatory Visit (HOSPITAL_COMMUNITY)
Admission: RE | Admit: 2011-10-12 | Discharge: 2011-10-12 | Disposition: A | Discharge status: Home / Self Care | Payer: Medicaid Other | Source: Ambulatory Visit | Attending: Interventional Radiology | Admitting: Interventional Radiology

## 2011-10-12 DIAGNOSIS — T8450XA Infection and inflammatory reaction due to unspecified internal joint prosthesis, initial encounter: Secondary | ICD-10-CM | POA: Insufficient documentation

## 2011-10-12 DIAGNOSIS — Z96659 Presence of unspecified artificial knee joint: Secondary | ICD-10-CM | POA: Insufficient documentation

## 2011-10-12 DIAGNOSIS — Y831 Surgical operation with implant of artificial internal device as the cause of abnormal reaction of the patient, or of later complication, without mention of misadventure at the time of the procedure: Secondary | ICD-10-CM | POA: Insufficient documentation

## 2011-10-12 DIAGNOSIS — T8459XA Infection and inflammatory reaction due to other internal joint prosthesis, initial encounter: Secondary | ICD-10-CM

## 2011-10-12 NOTE — Procedures (Signed)
Procedure : right PICC exchange under fluoro guidance   RT SL PICC 42 cm in length with tip at cavoatrial junction Specimen : none Complications : none  Ready for use.

## 2011-11-18 ENCOUNTER — Ambulatory Visit: Payer: Medicaid Other | Admitting: Infectious Disease

## 2011-12-02 ENCOUNTER — Ambulatory Visit: Payer: Medicaid Other | Admitting: Infectious Disease

## 2012-02-12 DIAGNOSIS — M25569 Pain in unspecified knee: Secondary | ICD-10-CM | POA: Insufficient documentation

## 2012-02-12 DIAGNOSIS — M25562 Pain in left knee: Secondary | ICD-10-CM | POA: Insufficient documentation

## 2012-02-12 HISTORY — DX: Pain in left knee: M25.562

## 2012-02-12 HISTORY — DX: Pain in unspecified knee: M25.569

## 2012-02-17 ENCOUNTER — Encounter: Payer: Self-pay | Admitting: Infectious Disease

## 2012-02-17 ENCOUNTER — Ambulatory Visit (INDEPENDENT_AMBULATORY_CARE_PROVIDER_SITE_OTHER): Payer: Medicaid Other | Admitting: Infectious Disease

## 2012-02-17 VITALS — BP 134/81 | HR 67 | Temp 97.9°F | Ht 68.0 in | Wt 304.0 lb

## 2012-02-17 DIAGNOSIS — T8454XA Infection and inflammatory reaction due to internal left knee prosthesis, initial encounter: Secondary | ICD-10-CM

## 2012-02-17 DIAGNOSIS — Z96659 Presence of unspecified artificial knee joint: Secondary | ICD-10-CM

## 2012-02-17 DIAGNOSIS — A491 Streptococcal infection, unspecified site: Secondary | ICD-10-CM

## 2012-02-17 DIAGNOSIS — T8450XA Infection and inflammatory reaction due to unspecified internal joint prosthesis, initial encounter: Secondary | ICD-10-CM

## 2012-02-17 DIAGNOSIS — B953 Streptococcus pneumoniae as the cause of diseases classified elsewhere: Secondary | ICD-10-CM

## 2012-02-17 MED ORDER — AMOXICILLIN 500 MG PO TABS: 500.0000 mg | ORAL_TABLET | Freq: Three times a day (TID) | ORAL | Status: DC

## 2012-02-17 NOTE — Progress Notes (Signed)
Subjective:    Patient ID: Brent Ortiz, male    DOB: [birth year], 51 y.o.   MRN: [id number]  HPI  51 year old man with prosthetic knee admitted initially with what was thought to be HCAp, found to have prosthetic knee infection with pneumococcus (PCN Sensitive), narrowed to rocephin. He had exchange arthroplasty on 09/03/11 by Dr. Charlann Boxer. He was changed over to amoxillin tid, now he dropped dose to bid. His ESR was still in 80s when I last sawhim in the Spring. Since his last clinic visit he states he's had increasing left knee pain he also with warmth at times as well. He has seen Dr. Vallery Sa but not for last few months. I told will recheck inflammatory markers today and asked him to increase his amoxicillin 3 times daily. I asked him to see Dr. Vallery Sa for consideration of aspiration of the knee joint to see whether he might have increased white blood cells and also in the fluid for crystal analysis as well as culture (although the cultures are unlikely to yield organism ) if he fails suppressive oral antibiotics I would recommend a two-stage exchange arthroplasty with intravenous antibiotics being given while the prosthetic knee has been removed. Review of Systems  Constitutional: Negative for fever, chills, diaphoresis, activity change, appetite change, fatigue and unexpected weight change.  HENT: Negative for congestion, sore throat, rhinorrhea, sneezing, trouble swallowing and sinus pressure.   Eyes: Negative for photophobia and visual disturbance.  Respiratory: Negative for cough, chest tightness, shortness of breath, wheezing and stridor.   Cardiovascular: Negative for chest pain, palpitations and leg swelling.  Gastrointestinal: Negative for nausea, vomiting, abdominal pain, diarrhea, constipation, blood in stool, abdominal distention and anal bleeding.  Genitourinary: Negative for dysuria, hematuria, flank pain and difficulty urinating.  Musculoskeletal: Positive for joint swelling, arthralgias and  gait problem. Negative for myalgias and back pain.  Skin: Negative for color change, pallor, rash and wound.  Neurological: Negative for dizziness, tremors, weakness and light-headedness.  Hematological: Negative for adenopathy. Does not bruise/bleed easily.  Psychiatric/Behavioral: Negative for behavioral problems, confusion, disturbed wake/sleep cycle, dysphoric mood, decreased concentration and agitation.       Objective:   Physical Exam  Constitutional: He is oriented to person, place, and time. He appears well-developed and well-nourished. No distress.  HENT:  Head: Normocephalic and atraumatic.  Mouth/Throat: Oropharynx is clear and moist. No oropharyngeal exudate.  Eyes: Conjunctivae and EOM are normal. Pupils are equal, round, and reactive to light. No scleral icterus.  Neck: Normal range of motion. Neck supple. No JVD present.  Cardiovascular: Normal rate, regular rhythm and normal heart sounds.  Exam reveals no gallop and no friction rub.   No murmur heard. Pulmonary/Chest: Effort normal and breath sounds normal. No respiratory distress. He has no wheezes. He has no rales. He exhibits no tenderness.  Abdominal: He exhibits no distension and no mass. There is no tenderness. There is no rebound and no guarding.  Musculoskeletal: He exhibits no edema and no tenderness.       Legs: Lymphadenopathy:    He has no cervical adenopathy.  Neurological: He is alert and oriented to person, place, and time. He has normal reflexes. He exhibits normal muscle tone. Coordination normal.  Skin: Skin is warm and dry. He is not diaphoretic. No erythema. No pallor.  Psychiatric: He has a normal mood and affect. His behavior is normal. Judgment and thought content normal.          Assessment & Plan:  Infection of  prosthetic left knee joint Check inflammatory markers. Increase amoxicillin to 3 times a day. I would recommend he see Dr. Charlann Boxer for consideration of aspiration of the joint with this  fluid being sent for cell count differential crystal analysis and cultures.  Streptococcus pneumoniae infection Was culprit pathogen is prosthetic knee infection

## 2012-02-17 NOTE — Assessment & Plan Note (Signed)
Check inflammatory markers. Increase amoxicillin to 3 times a day. I would recommend he see Dr. Charlann Boxer for consideration of aspiration of the joint with this fluid being sent for cell count differential crystal analysis and cultures.

## 2012-02-17 NOTE — Assessment & Plan Note (Signed)
Was culprit pathogen is prosthetic knee infection

## 2012-04-20 ENCOUNTER — Ambulatory Visit: Payer: Medicaid Other | Admitting: Infectious Disease

## 2012-04-27 ENCOUNTER — Ambulatory Visit: Payer: Medicaid Other | Admitting: Infectious Disease

## 2013-01-13 DIAGNOSIS — Z6841 Body Mass Index (BMI) 40.0 and over, adult: Secondary | ICD-10-CM | POA: Insufficient documentation

## 2013-01-13 HISTORY — DX: Body Mass Index (BMI) 40.0 and over, adult: Z684

## 2013-01-13 HISTORY — DX: Morbid (severe) obesity due to excess calories: E66.01

## 2013-02-09 IMAGING — CR DG CHEST 2V
2 series · 2 of 2 positions shown · non-contrast
Comparison: None.

CLINICAL DATA: Left knee infection.  Hypertension.  Diabetes.

CHEST - 2 VIEW

[w chest lat]
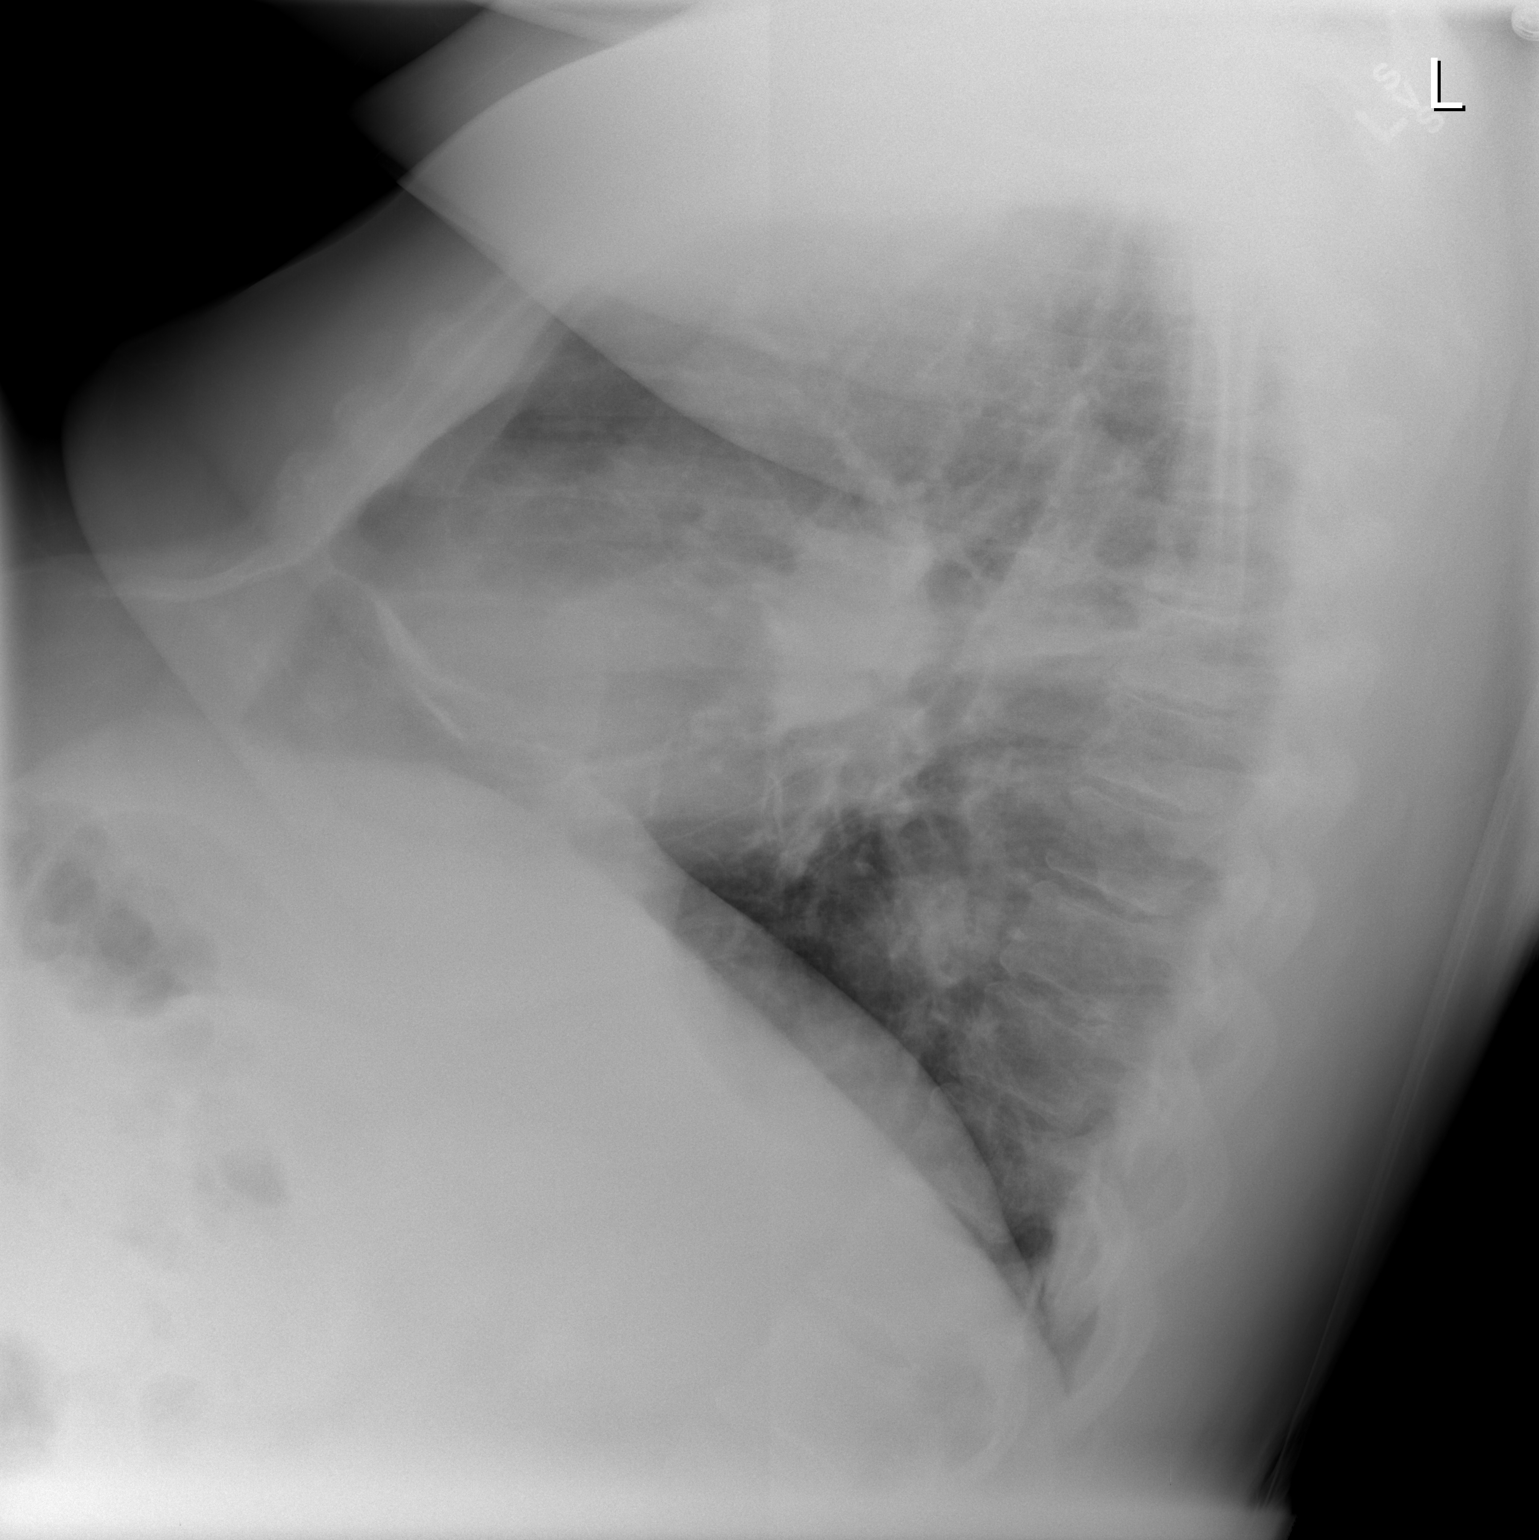

[view not recorded]
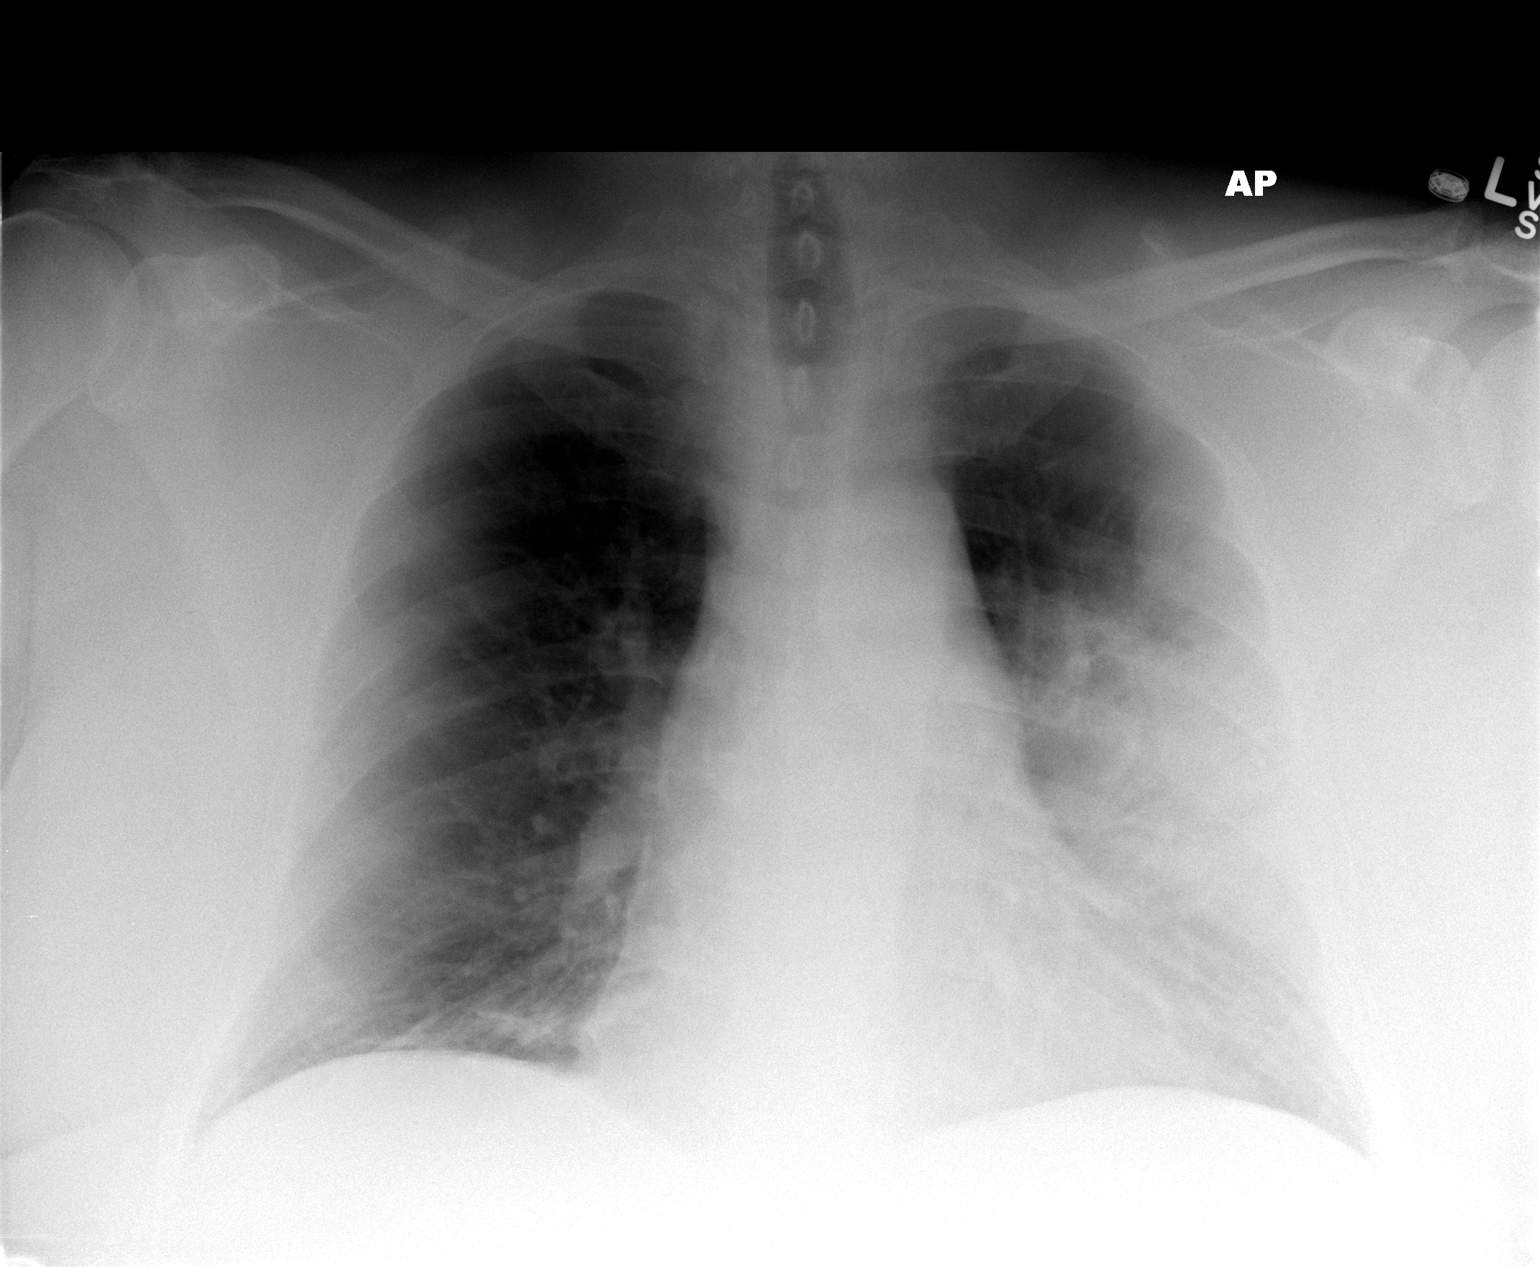

[2 of 2 positions shown; findings below may reference images not displayed]

FINDINGS: Airspace opacity noted in the left chest, primarily
involving the upper lobe but also possibly some of the lower lobe.
On the lateral projection, I cannot exclude left hilar adenopathy.
There is also a right middle lobe subsegmental atelectasis.

Cardiac and mediastinal contours appear unremarkable.

No pleural effusion identified.
IMPRESSION: 1.  Large opacity in the left chest, probably pneumonia involving
the lingula and possibly left lower lobe.  There is possible left
adenopathy.  Underlying malignancy is not excluded, and follow-up
to ensure clearance, or chest CT, is recommended.
2.  Subsegmental atelectasis in the right middle lobe.

## 2013-02-10 IMAGING — CT CT CHEST W/ CM
2 of 4 series · 15 of 36 positions shown, 18 images · IV contrast (APPLIED)
Comparison: Chest radiographs 09/03/2011.

CLINICAL DATA: Evaluate lung opacity.

CT CHEST WITH CONTRAST
TECHNIQUE: Multidetector CT imaging of the chest was performed
following the standard protocol during bolus administration of
intravenous contrast.
Contrast: 80mL OMNIPAQUE IOHEXOL 300 MG/ML IJ SOLN

[Series 2: chest with st · axial · 0.74mm/px · z∈[-70,+194]mm · 12 of 63 slices shown, 15 images]
[im 5/63  mediastinal]
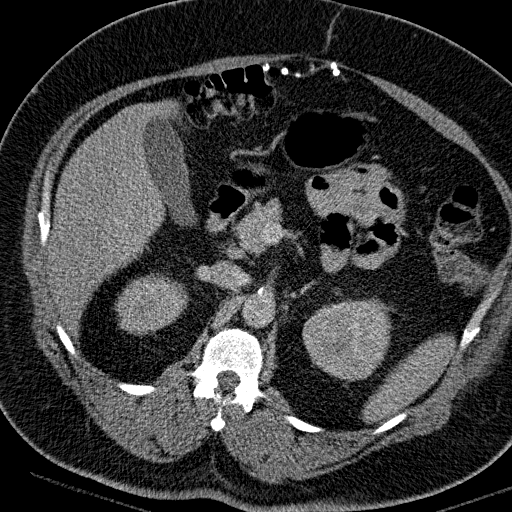
[im 5/63  lung]
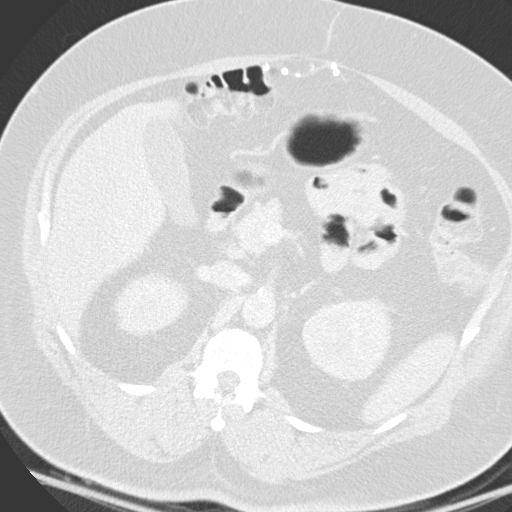
[im 9/63  lung]
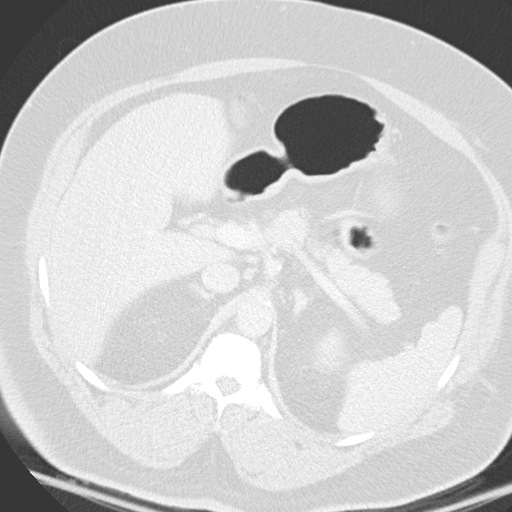
[im 14/63  lung]
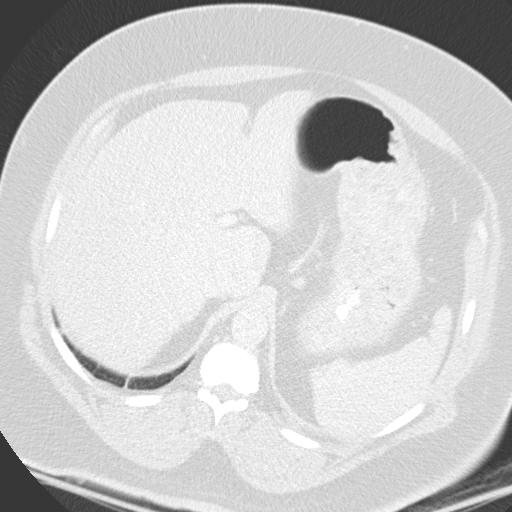
[im 18/63  lung]
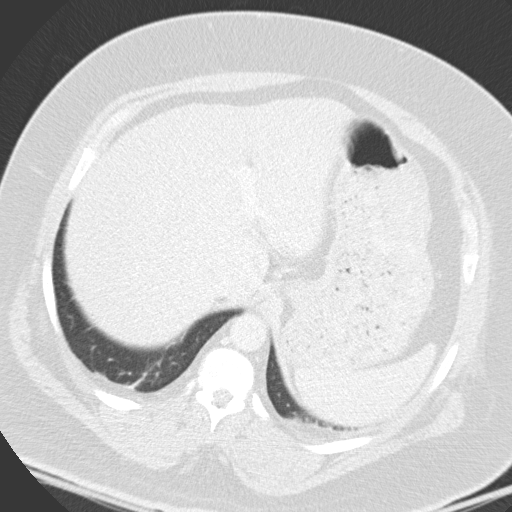
[im 23/63  mediastinal]
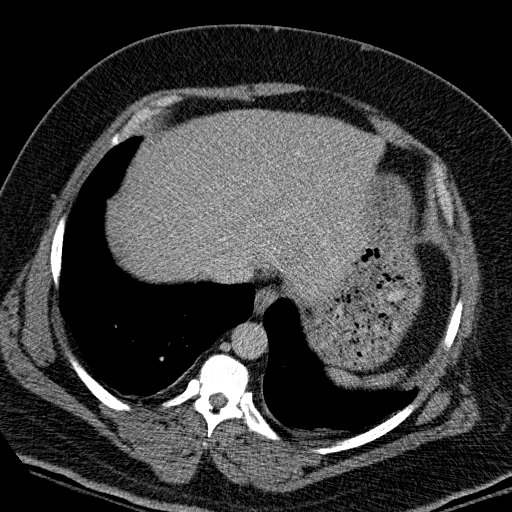
[im 23/63  lung]
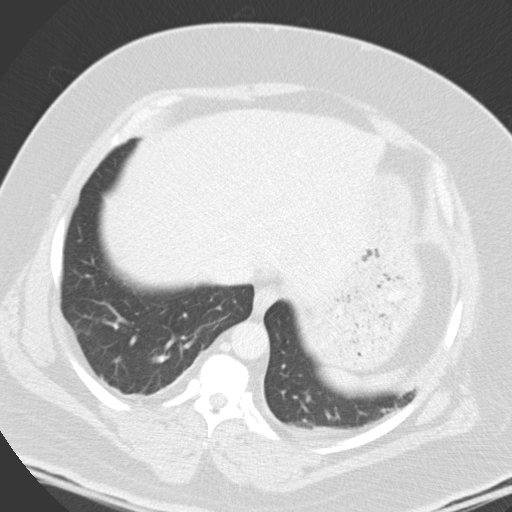
[im 27/63  lung]
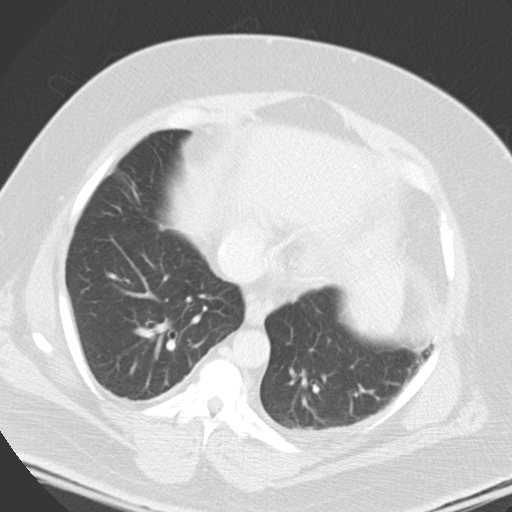
[im 36/63  lung]
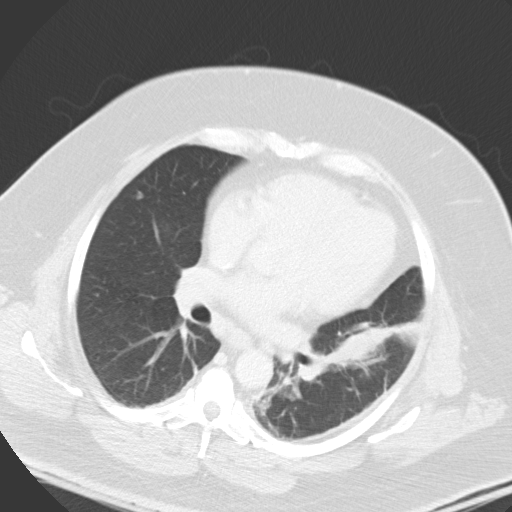
[im 40/63  lung]
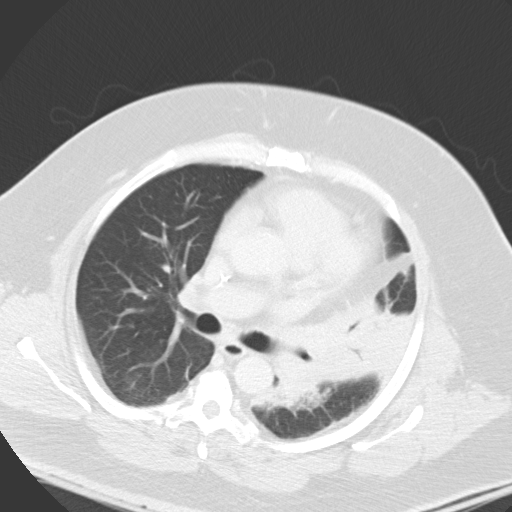
[im 45/63  mediastinal]
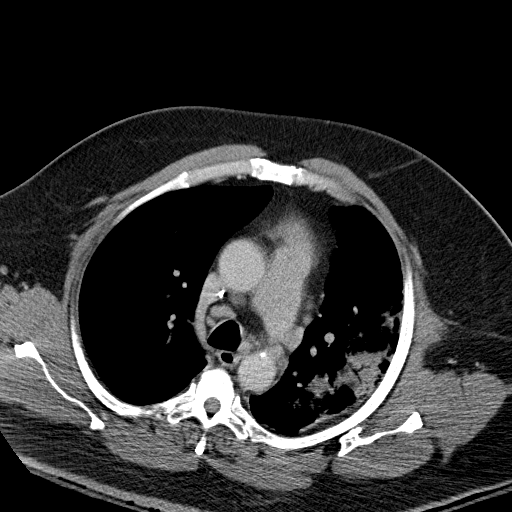
[im 45/63  lung]
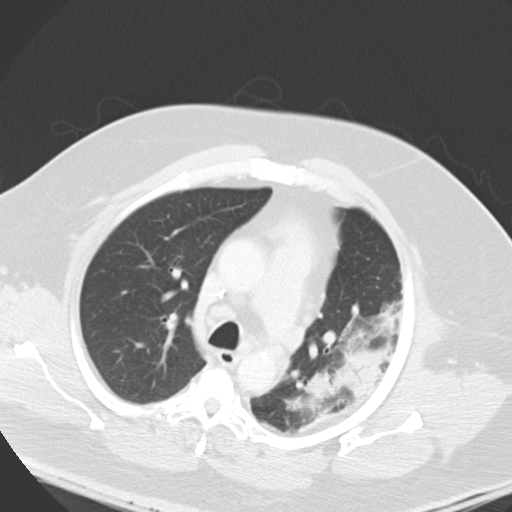
[im 49/63  lung]
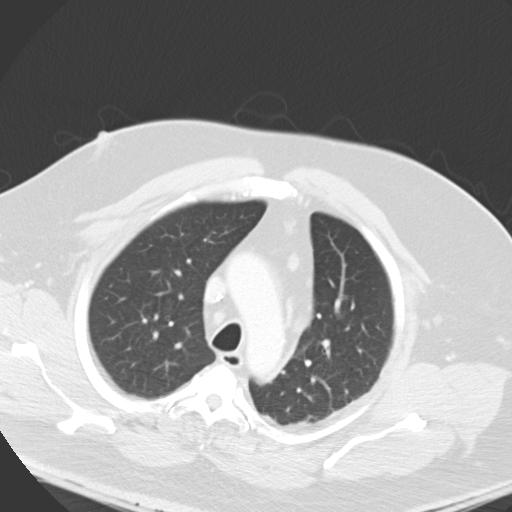
[im 54/63  lung]
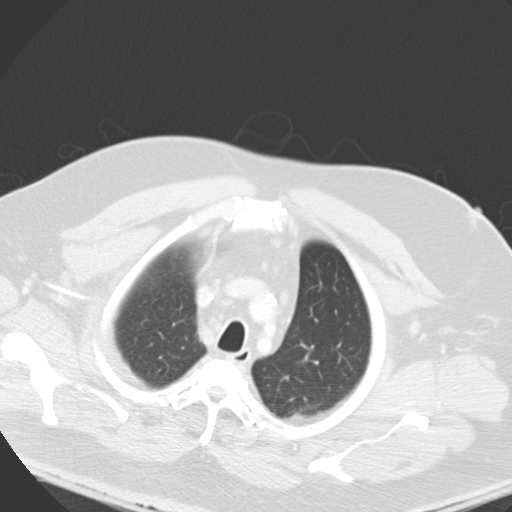
[im 58/63  lung]
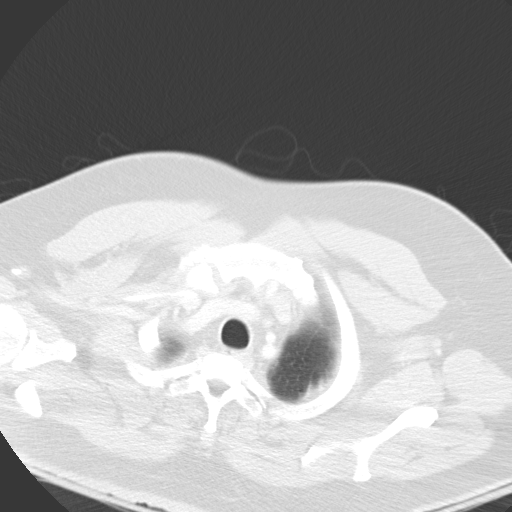

[Series 602: cor · coronal · 0.74mm/px · 3 of 152 slices shown]
[im 31/152  lung]
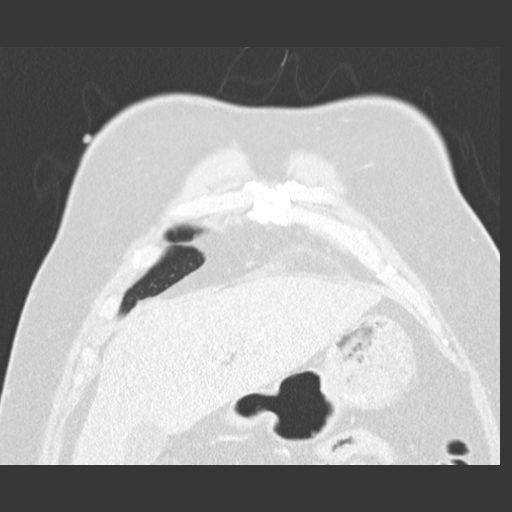
[im 61/152  lung]
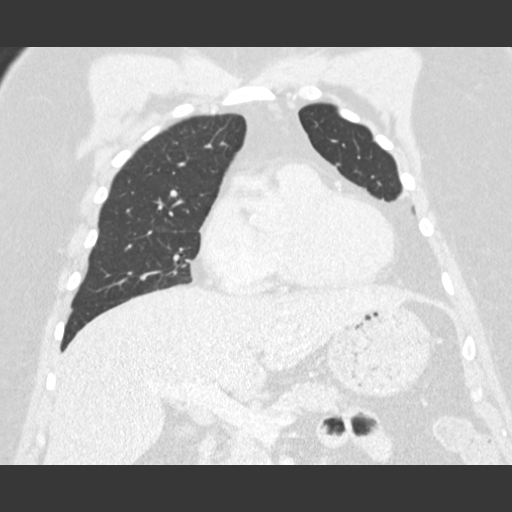
[im 91/152  lung]
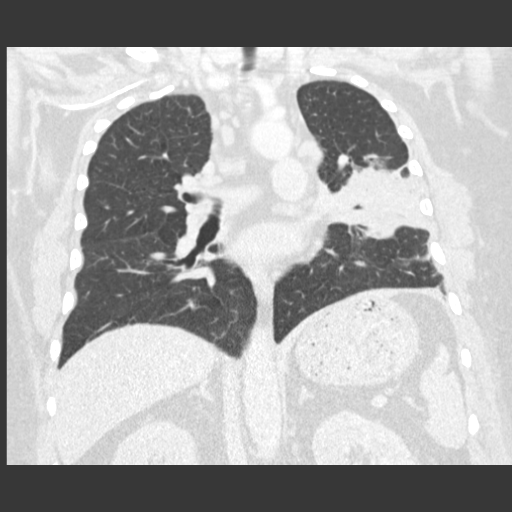

[15 of 36 positions shown; findings below may reference images not displayed]

FINDINGS: There is dense ill-defined left perihilar airspace
opacity involving the central portions of the left upper and lower
lobes.  There are associated air bronchograms and extrinsic
bronchial narrowing.  No endobronchial mass is demonstrated.

In the right lung, there is streaky atelectasis or scarring without
air space disease.  There is a small (6 mm) subpleural nodular
density in the right middle lobe.

There is no significant pleural or pericardial effusion.  Numerous
prominent mediastinal lymph nodes are noted.  These include a high
right paratracheal node measuring 14 mm on image 8, a 15 mm AP
window node on image 17 and a 16 mm subcarinal node on image 26.
Contrast bolus is suboptimal, limiting hilar assessment.  Right arm
PICC is in place. There is some coronary artery disease.

The visualized upper abdomen is notable for mild hepatic steatosis
and postsurgical changes in the anterior abdominal wall.  There is
no adrenal mass.
IMPRESSION: 1.  Central airspace disease in the left lung is perihilar in
distribution, involving the upper and lower lobes.  Appearance is
nonspecific and could reflect atypical pneumonia.  However,
infiltrating neoplasm cannot be excluded.
2.  Multiple mildly enlarged mediastinal lymph nodes are
nonspecific and potentially reactive.
3.  No significant pleural effusion.

If there is clinical evidence of pneumonia, radiographic followup
after appropriate therapy would be advised.  If there is clinical
concern of neoplasm, bronchoscopy should be considered.

## 2013-03-19 IMAGING — CR DG CHEST 1V
1 series · 1 of 1 positions shown · non-contrast
Comparison: 09/04/2011

CLINICAL DATA: Evaluate PICC line placement

CHEST - 1 VIEW

[w chest pa]
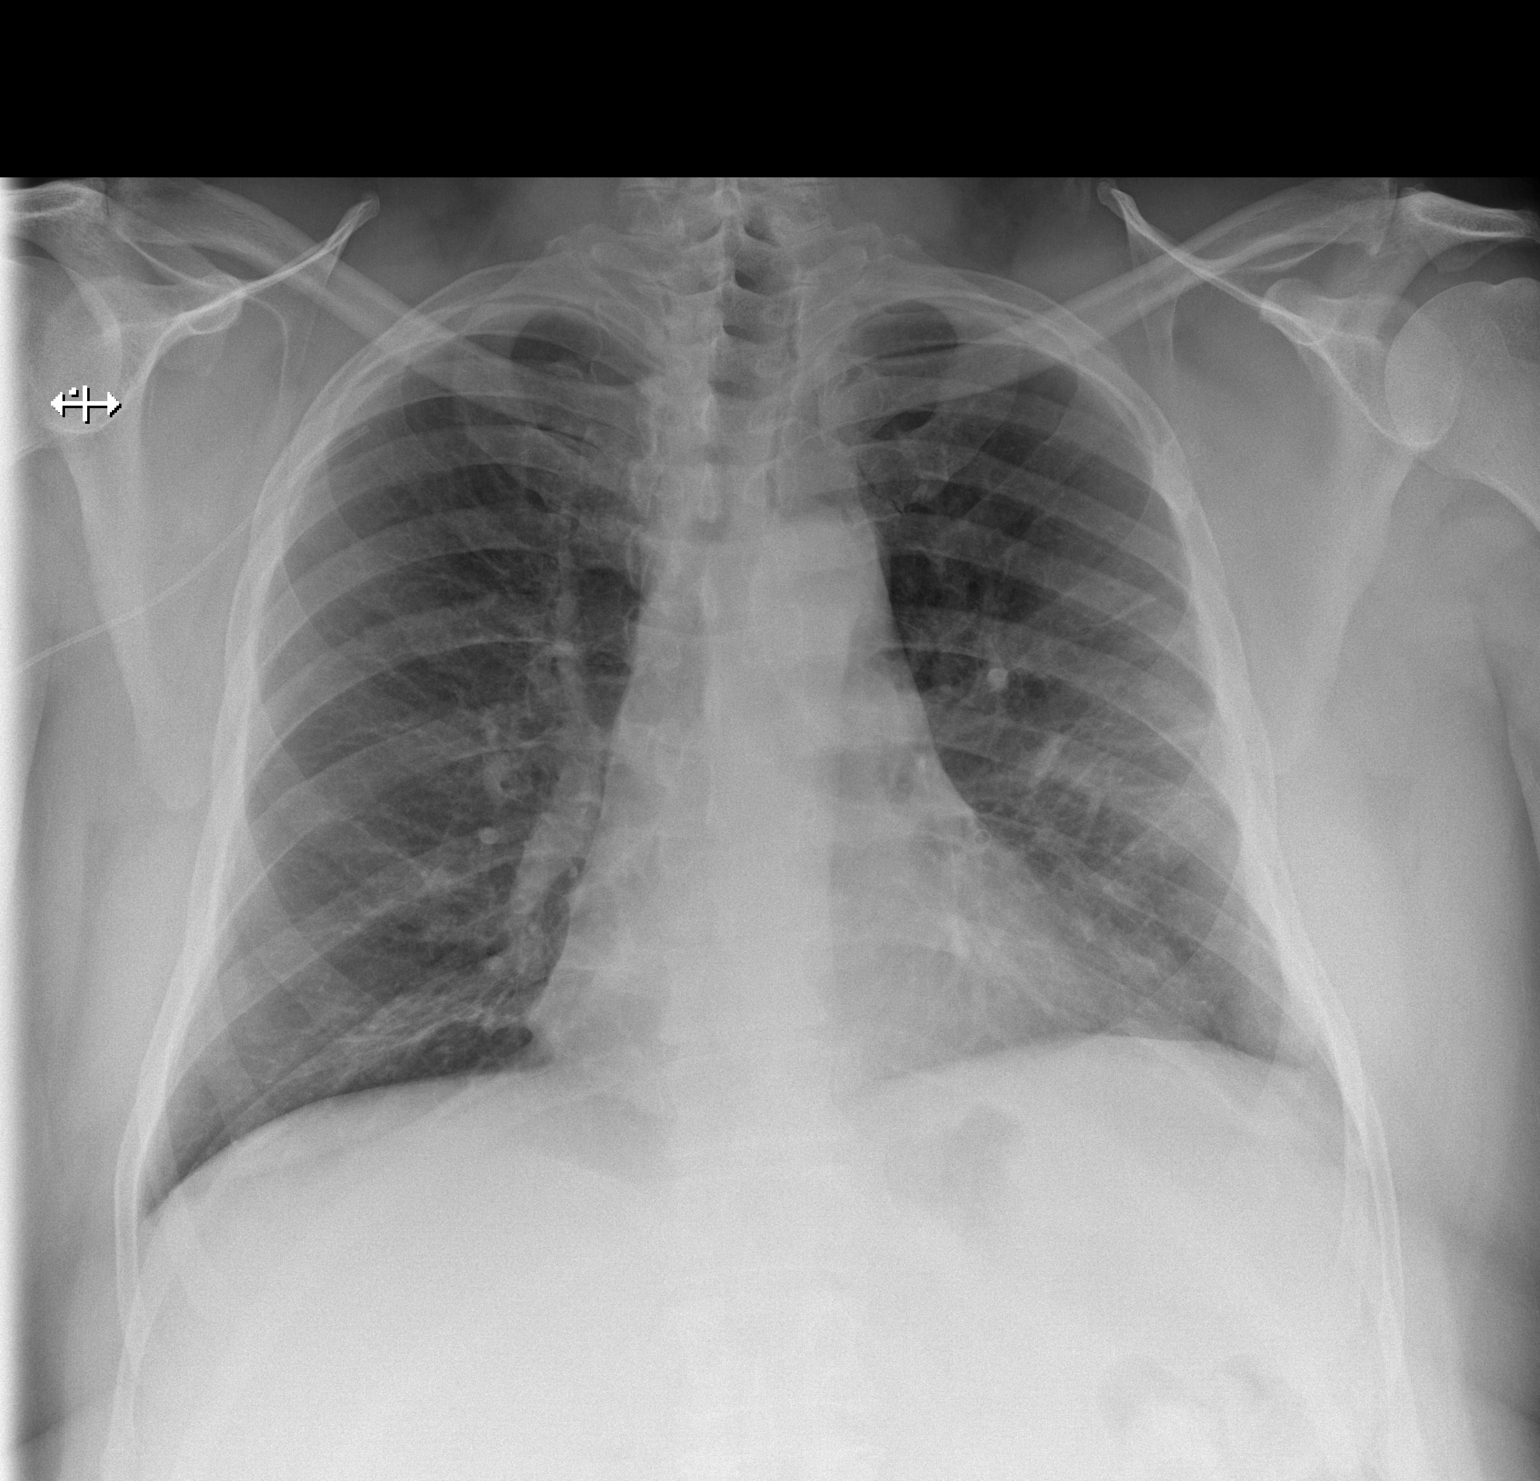

[1 of 1 positions shown; findings below may reference images not displayed]

FINDINGS: The right arm PICC line is identified.  The tip appears
to be within the right axillary vein.

The heart size appears normal.

There is atelectasis identified within the left midlung and left
base.  Improving left lung consolidation.
IMPRESSION: 1.  Right arm PICC line tip appears to be within the right axillary
vein.  This should be repositioned with tip in the SVC and oriented
towards the right atrium.

## 2014-07-06 DIAGNOSIS — M25569 Pain in unspecified knee: Secondary | ICD-10-CM | POA: Diagnosis not present

## 2014-07-09 DIAGNOSIS — M25569 Pain in unspecified knee: Secondary | ICD-10-CM | POA: Diagnosis not present

## 2014-07-10 DIAGNOSIS — M25569 Pain in unspecified knee: Secondary | ICD-10-CM | POA: Diagnosis not present

## 2014-07-11 DIAGNOSIS — M25569 Pain in unspecified knee: Secondary | ICD-10-CM | POA: Diagnosis not present

## 2014-07-12 DIAGNOSIS — M25569 Pain in unspecified knee: Secondary | ICD-10-CM | POA: Diagnosis not present

## 2014-07-13 DIAGNOSIS — M5136 Other intervertebral disc degeneration, lumbar region: Secondary | ICD-10-CM | POA: Diagnosis not present

## 2014-07-13 DIAGNOSIS — M25569 Pain in unspecified knee: Secondary | ICD-10-CM | POA: Diagnosis not present

## 2014-07-13 DIAGNOSIS — Z96659 Presence of unspecified artificial knee joint: Secondary | ICD-10-CM | POA: Diagnosis not present

## 2014-07-13 DIAGNOSIS — G894 Chronic pain syndrome: Secondary | ICD-10-CM | POA: Diagnosis not present

## 2014-07-13 DIAGNOSIS — E663 Overweight: Secondary | ICD-10-CM | POA: Diagnosis not present

## 2014-07-13 DIAGNOSIS — T84098A Other mechanical complication of other internal joint prosthesis, initial encounter: Secondary | ICD-10-CM | POA: Diagnosis not present

## 2014-07-13 DIAGNOSIS — F1120 Opioid dependence, uncomplicated: Secondary | ICD-10-CM | POA: Diagnosis not present

## 2014-07-16 DIAGNOSIS — M25569 Pain in unspecified knee: Secondary | ICD-10-CM | POA: Diagnosis not present

## 2014-07-17 DIAGNOSIS — M25569 Pain in unspecified knee: Secondary | ICD-10-CM | POA: Diagnosis not present

## 2014-07-18 DIAGNOSIS — M25569 Pain in unspecified knee: Secondary | ICD-10-CM | POA: Diagnosis not present

## 2014-07-19 DIAGNOSIS — M25569 Pain in unspecified knee: Secondary | ICD-10-CM | POA: Diagnosis not present

## 2014-07-20 DIAGNOSIS — M25569 Pain in unspecified knee: Secondary | ICD-10-CM | POA: Diagnosis not present

## 2014-07-23 DIAGNOSIS — M25569 Pain in unspecified knee: Secondary | ICD-10-CM | POA: Diagnosis not present

## 2014-07-23 DIAGNOSIS — Z6841 Body Mass Index (BMI) 40.0 and over, adult: Secondary | ICD-10-CM | POA: Diagnosis not present

## 2014-07-23 DIAGNOSIS — E785 Hyperlipidemia, unspecified: Secondary | ICD-10-CM | POA: Diagnosis not present

## 2014-07-23 DIAGNOSIS — I10 Essential (primary) hypertension: Secondary | ICD-10-CM | POA: Diagnosis not present

## 2014-07-23 DIAGNOSIS — E1140 Type 2 diabetes mellitus with diabetic neuropathy, unspecified: Secondary | ICD-10-CM | POA: Diagnosis not present

## 2014-07-23 DIAGNOSIS — G8929 Other chronic pain: Secondary | ICD-10-CM | POA: Diagnosis not present

## 2014-07-23 DIAGNOSIS — F329 Major depressive disorder, single episode, unspecified: Secondary | ICD-10-CM | POA: Diagnosis not present

## 2014-07-23 DIAGNOSIS — E1142 Type 2 diabetes mellitus with diabetic polyneuropathy: Secondary | ICD-10-CM | POA: Diagnosis not present

## 2014-07-24 DIAGNOSIS — M25569 Pain in unspecified knee: Secondary | ICD-10-CM | POA: Diagnosis not present

## 2014-07-25 DIAGNOSIS — M25569 Pain in unspecified knee: Secondary | ICD-10-CM | POA: Diagnosis not present

## 2014-07-26 DIAGNOSIS — M25569 Pain in unspecified knee: Secondary | ICD-10-CM | POA: Diagnosis not present

## 2014-07-30 DIAGNOSIS — M25569 Pain in unspecified knee: Secondary | ICD-10-CM | POA: Diagnosis not present

## 2014-07-30 DIAGNOSIS — E785 Hyperlipidemia, unspecified: Secondary | ICD-10-CM | POA: Diagnosis not present

## 2014-07-30 DIAGNOSIS — E669 Obesity, unspecified: Secondary | ICD-10-CM | POA: Diagnosis not present

## 2014-07-30 DIAGNOSIS — E1140 Type 2 diabetes mellitus with diabetic neuropathy, unspecified: Secondary | ICD-10-CM | POA: Diagnosis not present

## 2014-07-30 DIAGNOSIS — N401 Enlarged prostate with lower urinary tract symptoms: Secondary | ICD-10-CM | POA: Diagnosis not present

## 2014-07-30 DIAGNOSIS — I10 Essential (primary) hypertension: Secondary | ICD-10-CM | POA: Diagnosis not present

## 2014-07-31 DIAGNOSIS — M25569 Pain in unspecified knee: Secondary | ICD-10-CM | POA: Diagnosis not present

## 2014-08-02 DIAGNOSIS — M25569 Pain in unspecified knee: Secondary | ICD-10-CM | POA: Diagnosis not present

## 2014-08-03 DIAGNOSIS — M25569 Pain in unspecified knee: Secondary | ICD-10-CM | POA: Diagnosis not present

## 2014-08-03 DIAGNOSIS — M5136 Other intervertebral disc degeneration, lumbar region: Secondary | ICD-10-CM | POA: Diagnosis not present

## 2014-08-03 DIAGNOSIS — M47817 Spondylosis without myelopathy or radiculopathy, lumbosacral region: Secondary | ICD-10-CM | POA: Diagnosis not present

## 2014-08-06 DIAGNOSIS — M25569 Pain in unspecified knee: Secondary | ICD-10-CM | POA: Diagnosis not present

## 2014-08-07 DIAGNOSIS — M25569 Pain in unspecified knee: Secondary | ICD-10-CM | POA: Diagnosis not present

## 2014-08-08 DIAGNOSIS — F1120 Opioid dependence, uncomplicated: Secondary | ICD-10-CM | POA: Diagnosis not present

## 2014-08-08 DIAGNOSIS — M5136 Other intervertebral disc degeneration, lumbar region: Secondary | ICD-10-CM | POA: Diagnosis not present

## 2014-08-08 DIAGNOSIS — G894 Chronic pain syndrome: Secondary | ICD-10-CM | POA: Diagnosis not present

## 2014-08-08 DIAGNOSIS — M25569 Pain in unspecified knee: Secondary | ICD-10-CM | POA: Diagnosis not present

## 2014-08-08 DIAGNOSIS — T84098A Other mechanical complication of other internal joint prosthesis, initial encounter: Secondary | ICD-10-CM | POA: Diagnosis not present

## 2014-08-08 DIAGNOSIS — Z96659 Presence of unspecified artificial knee joint: Secondary | ICD-10-CM | POA: Diagnosis not present

## 2014-08-08 DIAGNOSIS — E663 Overweight: Secondary | ICD-10-CM | POA: Diagnosis not present

## 2014-08-09 DIAGNOSIS — M25569 Pain in unspecified knee: Secondary | ICD-10-CM | POA: Diagnosis not present

## 2014-08-10 DIAGNOSIS — M25569 Pain in unspecified knee: Secondary | ICD-10-CM | POA: Diagnosis not present

## 2014-08-20 DIAGNOSIS — M25569 Pain in unspecified knee: Secondary | ICD-10-CM | POA: Diagnosis not present

## 2014-08-21 DIAGNOSIS — M25569 Pain in unspecified knee: Secondary | ICD-10-CM | POA: Diagnosis not present

## 2014-08-22 DIAGNOSIS — M25569 Pain in unspecified knee: Secondary | ICD-10-CM | POA: Diagnosis not present

## 2014-08-23 DIAGNOSIS — M25569 Pain in unspecified knee: Secondary | ICD-10-CM | POA: Diagnosis not present

## 2014-08-24 DIAGNOSIS — M25569 Pain in unspecified knee: Secondary | ICD-10-CM | POA: Diagnosis not present

## 2014-09-07 DIAGNOSIS — M5136 Other intervertebral disc degeneration, lumbar region: Secondary | ICD-10-CM | POA: Diagnosis not present

## 2014-09-07 DIAGNOSIS — G894 Chronic pain syndrome: Secondary | ICD-10-CM | POA: Diagnosis not present

## 2014-09-07 DIAGNOSIS — Z96659 Presence of unspecified artificial knee joint: Secondary | ICD-10-CM | POA: Diagnosis not present

## 2014-09-07 DIAGNOSIS — F1120 Opioid dependence, uncomplicated: Secondary | ICD-10-CM | POA: Diagnosis not present

## 2014-09-07 DIAGNOSIS — T84098A Other mechanical complication of other internal joint prosthesis, initial encounter: Secondary | ICD-10-CM | POA: Diagnosis not present

## 2014-09-07 DIAGNOSIS — E663 Overweight: Secondary | ICD-10-CM | POA: Diagnosis not present

## 2014-09-10 DIAGNOSIS — G8929 Other chronic pain: Secondary | ICD-10-CM | POA: Diagnosis not present

## 2014-09-10 DIAGNOSIS — Z6841 Body Mass Index (BMI) 40.0 and over, adult: Secondary | ICD-10-CM | POA: Diagnosis not present

## 2014-09-10 DIAGNOSIS — M519 Unspecified thoracic, thoracolumbar and lumbosacral intervertebral disc disorder: Secondary | ICD-10-CM | POA: Diagnosis not present

## 2014-09-10 DIAGNOSIS — M25562 Pain in left knee: Secondary | ICD-10-CM | POA: Diagnosis not present

## 2014-10-10 DIAGNOSIS — Z6841 Body Mass Index (BMI) 40.0 and over, adult: Secondary | ICD-10-CM | POA: Diagnosis not present

## 2014-10-10 DIAGNOSIS — M25562 Pain in left knee: Secondary | ICD-10-CM | POA: Diagnosis not present

## 2014-10-10 DIAGNOSIS — G8929 Other chronic pain: Secondary | ICD-10-CM | POA: Diagnosis not present

## 2014-10-10 DIAGNOSIS — F17200 Nicotine dependence, unspecified, uncomplicated: Secondary | ICD-10-CM | POA: Diagnosis not present

## 2014-11-06 DIAGNOSIS — Z6841 Body Mass Index (BMI) 40.0 and over, adult: Secondary | ICD-10-CM | POA: Diagnosis not present

## 2014-11-06 DIAGNOSIS — I213 ST elevation (STEMI) myocardial infarction of unspecified site: Secondary | ICD-10-CM | POA: Diagnosis not present

## 2014-11-06 DIAGNOSIS — E1140 Type 2 diabetes mellitus with diabetic neuropathy, unspecified: Secondary | ICD-10-CM | POA: Diagnosis not present

## 2014-11-06 DIAGNOSIS — G8929 Other chronic pain: Secondary | ICD-10-CM | POA: Diagnosis not present

## 2014-11-06 DIAGNOSIS — E1142 Type 2 diabetes mellitus with diabetic polyneuropathy: Secondary | ICD-10-CM | POA: Diagnosis not present

## 2014-11-06 DIAGNOSIS — M519 Unspecified thoracic, thoracolumbar and lumbosacral intervertebral disc disorder: Secondary | ICD-10-CM | POA: Diagnosis not present

## 2015-02-20 DIAGNOSIS — I10 Essential (primary) hypertension: Secondary | ICD-10-CM | POA: Diagnosis not present

## 2015-02-20 DIAGNOSIS — E1140 Type 2 diabetes mellitus with diabetic neuropathy, unspecified: Secondary | ICD-10-CM | POA: Diagnosis not present

## 2015-02-20 DIAGNOSIS — E785 Hyperlipidemia, unspecified: Secondary | ICD-10-CM | POA: Diagnosis not present

## 2015-02-20 DIAGNOSIS — E1142 Type 2 diabetes mellitus with diabetic polyneuropathy: Secondary | ICD-10-CM | POA: Diagnosis not present

## 2015-02-22 DIAGNOSIS — D473 Essential (hemorrhagic) thrombocythemia: Secondary | ICD-10-CM | POA: Diagnosis not present

## 2015-02-22 DIAGNOSIS — E1142 Type 2 diabetes mellitus with diabetic polyneuropathy: Secondary | ICD-10-CM | POA: Diagnosis not present

## 2015-02-22 DIAGNOSIS — E785 Hyperlipidemia, unspecified: Secondary | ICD-10-CM | POA: Diagnosis not present

## 2015-02-22 DIAGNOSIS — N401 Enlarged prostate with lower urinary tract symptoms: Secondary | ICD-10-CM | POA: Diagnosis not present

## 2015-02-22 DIAGNOSIS — I10 Essential (primary) hypertension: Secondary | ICD-10-CM | POA: Diagnosis not present

## 2015-03-12 DIAGNOSIS — Z6841 Body Mass Index (BMI) 40.0 and over, adult: Secondary | ICD-10-CM | POA: Diagnosis not present

## 2015-03-12 DIAGNOSIS — F11188 Opioid abuse with other opioid-induced disorder: Secondary | ICD-10-CM | POA: Diagnosis not present

## 2015-03-18 DIAGNOSIS — M1990 Unspecified osteoarthritis, unspecified site: Secondary | ICD-10-CM | POA: Diagnosis not present

## 2015-03-18 DIAGNOSIS — E119 Type 2 diabetes mellitus without complications: Secondary | ICD-10-CM | POA: Diagnosis not present

## 2015-03-18 DIAGNOSIS — I252 Old myocardial infarction: Secondary | ICD-10-CM | POA: Diagnosis not present

## 2015-03-18 DIAGNOSIS — R072 Precordial pain: Secondary | ICD-10-CM | POA: Diagnosis not present

## 2015-03-18 DIAGNOSIS — K219 Gastro-esophageal reflux disease without esophagitis: Secondary | ICD-10-CM | POA: Diagnosis not present

## 2015-03-18 DIAGNOSIS — R079 Chest pain, unspecified: Secondary | ICD-10-CM | POA: Diagnosis not present

## 2015-03-18 DIAGNOSIS — M109 Gout, unspecified: Secondary | ICD-10-CM | POA: Diagnosis not present

## 2015-03-18 DIAGNOSIS — I2510 Atherosclerotic heart disease of native coronary artery without angina pectoris: Secondary | ICD-10-CM | POA: Diagnosis not present

## 2015-03-18 DIAGNOSIS — E780 Pure hypercholesterolemia: Secondary | ICD-10-CM | POA: Diagnosis not present

## 2015-03-18 DIAGNOSIS — R1013 Epigastric pain: Secondary | ICD-10-CM | POA: Diagnosis not present

## 2015-03-18 DIAGNOSIS — R0602 Shortness of breath: Secondary | ICD-10-CM | POA: Diagnosis not present

## 2015-03-18 DIAGNOSIS — I10 Essential (primary) hypertension: Secondary | ICD-10-CM | POA: Diagnosis not present

## 2015-03-19 DIAGNOSIS — F1123 Opioid dependence with withdrawal: Secondary | ICD-10-CM | POA: Diagnosis not present

## 2015-03-19 DIAGNOSIS — Z6841 Body Mass Index (BMI) 40.0 and over, adult: Secondary | ICD-10-CM | POA: Diagnosis not present

## 2015-03-21 DIAGNOSIS — F1120 Opioid dependence, uncomplicated: Secondary | ICD-10-CM | POA: Diagnosis not present

## 2015-03-26 DIAGNOSIS — G8929 Other chronic pain: Secondary | ICD-10-CM | POA: Diagnosis not present

## 2015-03-26 DIAGNOSIS — Z6841 Body Mass Index (BMI) 40.0 and over, adult: Secondary | ICD-10-CM | POA: Diagnosis not present

## 2015-03-26 DIAGNOSIS — F11188 Opioid abuse with other opioid-induced disorder: Secondary | ICD-10-CM | POA: Diagnosis not present

## 2015-04-04 DIAGNOSIS — G8929 Other chronic pain: Secondary | ICD-10-CM | POA: Diagnosis not present

## 2015-04-04 DIAGNOSIS — F11188 Opioid abuse with other opioid-induced disorder: Secondary | ICD-10-CM | POA: Diagnosis not present

## 2015-04-04 DIAGNOSIS — M179 Osteoarthritis of knee, unspecified: Secondary | ICD-10-CM | POA: Diagnosis not present

## 2015-04-22 DIAGNOSIS — F1120 Opioid dependence, uncomplicated: Secondary | ICD-10-CM | POA: Diagnosis not present

## 2015-04-23 DIAGNOSIS — F1920 Other psychoactive substance dependence, uncomplicated: Secondary | ICD-10-CM | POA: Diagnosis not present

## 2015-04-23 DIAGNOSIS — M1990 Unspecified osteoarthritis, unspecified site: Secondary | ICD-10-CM | POA: Diagnosis not present

## 2015-04-23 DIAGNOSIS — F19980 Other psychoactive substance use, unspecified with psychoactive substance-induced anxiety disorder: Secondary | ICD-10-CM | POA: Diagnosis not present

## 2015-04-23 DIAGNOSIS — F1120 Opioid dependence, uncomplicated: Secondary | ICD-10-CM | POA: Diagnosis not present

## 2015-04-23 DIAGNOSIS — I2510 Atherosclerotic heart disease of native coronary artery without angina pectoris: Secondary | ICD-10-CM | POA: Diagnosis not present

## 2015-04-23 DIAGNOSIS — R5382 Chronic fatigue, unspecified: Secondary | ICD-10-CM | POA: Diagnosis not present

## 2015-04-30 DIAGNOSIS — F19980 Other psychoactive substance use, unspecified with psychoactive substance-induced anxiety disorder: Secondary | ICD-10-CM | POA: Diagnosis not present

## 2015-04-30 DIAGNOSIS — I2510 Atherosclerotic heart disease of native coronary artery without angina pectoris: Secondary | ICD-10-CM | POA: Diagnosis not present

## 2015-04-30 DIAGNOSIS — M1990 Unspecified osteoarthritis, unspecified site: Secondary | ICD-10-CM | POA: Diagnosis not present

## 2015-04-30 DIAGNOSIS — F1120 Opioid dependence, uncomplicated: Secondary | ICD-10-CM | POA: Diagnosis not present

## 2015-04-30 DIAGNOSIS — F1920 Other psychoactive substance dependence, uncomplicated: Secondary | ICD-10-CM | POA: Diagnosis not present

## 2015-05-06 DIAGNOSIS — M1990 Unspecified osteoarthritis, unspecified site: Secondary | ICD-10-CM | POA: Diagnosis not present

## 2015-05-06 DIAGNOSIS — F19980 Other psychoactive substance use, unspecified with psychoactive substance-induced anxiety disorder: Secondary | ICD-10-CM | POA: Diagnosis not present

## 2015-05-06 DIAGNOSIS — I2510 Atherosclerotic heart disease of native coronary artery without angina pectoris: Secondary | ICD-10-CM | POA: Diagnosis not present

## 2015-05-06 DIAGNOSIS — F1120 Opioid dependence, uncomplicated: Secondary | ICD-10-CM | POA: Diagnosis not present

## 2015-05-06 DIAGNOSIS — F1920 Other psychoactive substance dependence, uncomplicated: Secondary | ICD-10-CM | POA: Diagnosis not present

## 2015-05-20 DIAGNOSIS — F1920 Other psychoactive substance dependence, uncomplicated: Secondary | ICD-10-CM | POA: Diagnosis not present

## 2015-05-20 DIAGNOSIS — F1120 Opioid dependence, uncomplicated: Secondary | ICD-10-CM | POA: Diagnosis not present

## 2015-05-20 DIAGNOSIS — I2510 Atherosclerotic heart disease of native coronary artery without angina pectoris: Secondary | ICD-10-CM | POA: Diagnosis not present

## 2015-05-20 DIAGNOSIS — M1990 Unspecified osteoarthritis, unspecified site: Secondary | ICD-10-CM | POA: Diagnosis not present

## 2015-05-20 DIAGNOSIS — F19980 Other psychoactive substance use, unspecified with psychoactive substance-induced anxiety disorder: Secondary | ICD-10-CM | POA: Diagnosis not present

## 2015-05-23 DIAGNOSIS — E785 Hyperlipidemia, unspecified: Secondary | ICD-10-CM | POA: Diagnosis not present

## 2015-05-23 DIAGNOSIS — E1149 Type 2 diabetes mellitus with other diabetic neurological complication: Secondary | ICD-10-CM | POA: Diagnosis not present

## 2015-05-23 DIAGNOSIS — G8921 Chronic pain due to trauma: Secondary | ICD-10-CM | POA: Diagnosis not present

## 2015-05-23 DIAGNOSIS — I10 Essential (primary) hypertension: Secondary | ICD-10-CM | POA: Diagnosis not present

## 2015-05-23 DIAGNOSIS — G8929 Other chronic pain: Secondary | ICD-10-CM | POA: Diagnosis not present

## 2015-05-23 DIAGNOSIS — Z23 Encounter for immunization: Secondary | ICD-10-CM | POA: Diagnosis not present

## 2015-05-23 DIAGNOSIS — E1142 Type 2 diabetes mellitus with diabetic polyneuropathy: Secondary | ICD-10-CM | POA: Diagnosis not present

## 2015-05-27 DIAGNOSIS — F1920 Other psychoactive substance dependence, uncomplicated: Secondary | ICD-10-CM | POA: Diagnosis not present

## 2015-05-27 DIAGNOSIS — M1990 Unspecified osteoarthritis, unspecified site: Secondary | ICD-10-CM | POA: Diagnosis not present

## 2015-05-27 DIAGNOSIS — I2510 Atherosclerotic heart disease of native coronary artery without angina pectoris: Secondary | ICD-10-CM | POA: Diagnosis not present

## 2015-05-27 DIAGNOSIS — F1120 Opioid dependence, uncomplicated: Secondary | ICD-10-CM | POA: Diagnosis not present

## 2015-05-27 DIAGNOSIS — F19980 Other psychoactive substance use, unspecified with psychoactive substance-induced anxiety disorder: Secondary | ICD-10-CM | POA: Diagnosis not present

## 2015-06-03 DIAGNOSIS — M1990 Unspecified osteoarthritis, unspecified site: Secondary | ICD-10-CM | POA: Diagnosis not present

## 2015-06-03 DIAGNOSIS — F1120 Opioid dependence, uncomplicated: Secondary | ICD-10-CM | POA: Diagnosis not present

## 2015-06-03 DIAGNOSIS — F1920 Other psychoactive substance dependence, uncomplicated: Secondary | ICD-10-CM | POA: Diagnosis not present

## 2015-06-03 DIAGNOSIS — F19980 Other psychoactive substance use, unspecified with psychoactive substance-induced anxiety disorder: Secondary | ICD-10-CM | POA: Diagnosis not present

## 2015-06-03 DIAGNOSIS — I2510 Atherosclerotic heart disease of native coronary artery without angina pectoris: Secondary | ICD-10-CM | POA: Diagnosis not present

## 2015-06-06 DIAGNOSIS — Z6841 Body Mass Index (BMI) 40.0 and over, adult: Secondary | ICD-10-CM | POA: Diagnosis not present

## 2015-06-06 DIAGNOSIS — G8929 Other chronic pain: Secondary | ICD-10-CM | POA: Diagnosis not present

## 2015-06-06 DIAGNOSIS — J449 Chronic obstructive pulmonary disease, unspecified: Secondary | ICD-10-CM | POA: Diagnosis not present

## 2015-06-06 DIAGNOSIS — G4733 Obstructive sleep apnea (adult) (pediatric): Secondary | ICD-10-CM | POA: Diagnosis not present

## 2015-06-06 DIAGNOSIS — M519 Unspecified thoracic, thoracolumbar and lumbosacral intervertebral disc disorder: Secondary | ICD-10-CM | POA: Diagnosis not present

## 2015-06-11 DIAGNOSIS — F1920 Other psychoactive substance dependence, uncomplicated: Secondary | ICD-10-CM | POA: Diagnosis not present

## 2015-06-11 DIAGNOSIS — M1990 Unspecified osteoarthritis, unspecified site: Secondary | ICD-10-CM | POA: Diagnosis not present

## 2015-06-11 DIAGNOSIS — I2510 Atherosclerotic heart disease of native coronary artery without angina pectoris: Secondary | ICD-10-CM | POA: Diagnosis not present

## 2015-06-11 DIAGNOSIS — F19980 Other psychoactive substance use, unspecified with psychoactive substance-induced anxiety disorder: Secondary | ICD-10-CM | POA: Diagnosis not present

## 2015-06-11 DIAGNOSIS — F1120 Opioid dependence, uncomplicated: Secondary | ICD-10-CM | POA: Diagnosis not present

## 2015-06-15 DIAGNOSIS — M5136 Other intervertebral disc degeneration, lumbar region: Secondary | ICD-10-CM | POA: Diagnosis not present

## 2015-06-20 DIAGNOSIS — Z6841 Body Mass Index (BMI) 40.0 and over, adult: Secondary | ICD-10-CM | POA: Diagnosis not present

## 2015-06-20 DIAGNOSIS — F1120 Opioid dependence, uncomplicated: Secondary | ICD-10-CM | POA: Diagnosis not present

## 2015-06-20 DIAGNOSIS — F19980 Other psychoactive substance use, unspecified with psychoactive substance-induced anxiety disorder: Secondary | ICD-10-CM | POA: Diagnosis not present

## 2015-06-20 DIAGNOSIS — F1920 Other psychoactive substance dependence, uncomplicated: Secondary | ICD-10-CM | POA: Diagnosis not present

## 2015-06-20 DIAGNOSIS — F17200 Nicotine dependence, unspecified, uncomplicated: Secondary | ICD-10-CM | POA: Diagnosis not present

## 2015-06-20 DIAGNOSIS — M519 Unspecified thoracic, thoracolumbar and lumbosacral intervertebral disc disorder: Secondary | ICD-10-CM | POA: Diagnosis not present

## 2015-06-20 DIAGNOSIS — I2510 Atherosclerotic heart disease of native coronary artery without angina pectoris: Secondary | ICD-10-CM | POA: Diagnosis not present

## 2015-06-20 DIAGNOSIS — M1990 Unspecified osteoarthritis, unspecified site: Secondary | ICD-10-CM | POA: Diagnosis not present

## 2015-06-27 DIAGNOSIS — F1920 Other psychoactive substance dependence, uncomplicated: Secondary | ICD-10-CM | POA: Diagnosis not present

## 2015-06-27 DIAGNOSIS — F1120 Opioid dependence, uncomplicated: Secondary | ICD-10-CM | POA: Diagnosis not present

## 2015-06-27 DIAGNOSIS — I2510 Atherosclerotic heart disease of native coronary artery without angina pectoris: Secondary | ICD-10-CM | POA: Diagnosis not present

## 2015-06-27 DIAGNOSIS — F19980 Other psychoactive substance use, unspecified with psychoactive substance-induced anxiety disorder: Secondary | ICD-10-CM | POA: Diagnosis not present

## 2015-06-27 DIAGNOSIS — M1990 Unspecified osteoarthritis, unspecified site: Secondary | ICD-10-CM | POA: Diagnosis not present

## 2015-07-29 DIAGNOSIS — Z79899 Other long term (current) drug therapy: Secondary | ICD-10-CM | POA: Diagnosis not present

## 2015-07-29 DIAGNOSIS — M25569 Pain in unspecified knee: Secondary | ICD-10-CM | POA: Diagnosis not present

## 2015-07-29 DIAGNOSIS — M79643 Pain in unspecified hand: Secondary | ICD-10-CM | POA: Diagnosis not present

## 2015-07-29 DIAGNOSIS — G894 Chronic pain syndrome: Secondary | ICD-10-CM | POA: Diagnosis not present

## 2015-07-29 DIAGNOSIS — M545 Low back pain: Secondary | ICD-10-CM | POA: Diagnosis not present

## 2015-08-22 DIAGNOSIS — M5417 Radiculopathy, lumbosacral region: Secondary | ICD-10-CM | POA: Diagnosis not present

## 2015-08-26 DIAGNOSIS — Z79899 Other long term (current) drug therapy: Secondary | ICD-10-CM | POA: Diagnosis not present

## 2015-08-26 DIAGNOSIS — G894 Chronic pain syndrome: Secondary | ICD-10-CM | POA: Diagnosis not present

## 2015-08-26 DIAGNOSIS — M545 Low back pain: Secondary | ICD-10-CM | POA: Diagnosis not present

## 2015-08-26 DIAGNOSIS — M170 Bilateral primary osteoarthritis of knee: Secondary | ICD-10-CM | POA: Diagnosis not present

## 2015-09-26 DIAGNOSIS — I10 Essential (primary) hypertension: Secondary | ICD-10-CM | POA: Diagnosis not present

## 2015-09-26 DIAGNOSIS — N401 Enlarged prostate with lower urinary tract symptoms: Secondary | ICD-10-CM | POA: Diagnosis not present

## 2015-09-26 DIAGNOSIS — I2510 Atherosclerotic heart disease of native coronary artery without angina pectoris: Secondary | ICD-10-CM | POA: Diagnosis not present

## 2015-09-26 DIAGNOSIS — E785 Hyperlipidemia, unspecified: Secondary | ICD-10-CM | POA: Diagnosis not present

## 2015-09-26 DIAGNOSIS — E1149 Type 2 diabetes mellitus with other diabetic neurological complication: Secondary | ICD-10-CM | POA: Diagnosis not present

## 2015-09-26 DIAGNOSIS — E1142 Type 2 diabetes mellitus with diabetic polyneuropathy: Secondary | ICD-10-CM | POA: Diagnosis not present

## 2015-10-01 DIAGNOSIS — M189 Osteoarthritis of first carpometacarpal joint, unspecified: Secondary | ICD-10-CM | POA: Diagnosis not present

## 2015-10-09 DIAGNOSIS — M25539 Pain in unspecified wrist: Secondary | ICD-10-CM | POA: Diagnosis not present

## 2015-10-09 DIAGNOSIS — M170 Bilateral primary osteoarthritis of knee: Secondary | ICD-10-CM | POA: Diagnosis not present

## 2015-10-09 DIAGNOSIS — G894 Chronic pain syndrome: Secondary | ICD-10-CM | POA: Diagnosis not present

## 2015-10-09 DIAGNOSIS — M25519 Pain in unspecified shoulder: Secondary | ICD-10-CM | POA: Diagnosis not present

## 2015-10-09 DIAGNOSIS — Z79891 Long term (current) use of opiate analgesic: Secondary | ICD-10-CM | POA: Diagnosis not present

## 2015-10-09 DIAGNOSIS — Z79899 Other long term (current) drug therapy: Secondary | ICD-10-CM | POA: Diagnosis not present

## 2015-11-07 DIAGNOSIS — M1811 Unilateral primary osteoarthritis of first carpometacarpal joint, right hand: Secondary | ICD-10-CM | POA: Diagnosis not present

## 2015-11-07 DIAGNOSIS — M1812 Unilateral primary osteoarthritis of first carpometacarpal joint, left hand: Secondary | ICD-10-CM | POA: Diagnosis not present

## 2015-11-19 DIAGNOSIS — M1812 Unilateral primary osteoarthritis of first carpometacarpal joint, left hand: Secondary | ICD-10-CM | POA: Diagnosis not present

## 2015-11-19 DIAGNOSIS — M1811 Unilateral primary osteoarthritis of first carpometacarpal joint, right hand: Secondary | ICD-10-CM | POA: Diagnosis not present

## 2015-11-19 DIAGNOSIS — G5601 Carpal tunnel syndrome, right upper limb: Secondary | ICD-10-CM | POA: Diagnosis not present

## 2015-11-30 DIAGNOSIS — G5601 Carpal tunnel syndrome, right upper limb: Secondary | ICD-10-CM | POA: Diagnosis not present

## 2015-11-30 DIAGNOSIS — M1811 Unilateral primary osteoarthritis of first carpometacarpal joint, right hand: Secondary | ICD-10-CM | POA: Diagnosis not present

## 2015-12-06 DIAGNOSIS — G5601 Carpal tunnel syndrome, right upper limb: Secondary | ICD-10-CM | POA: Diagnosis not present

## 2015-12-06 DIAGNOSIS — M1811 Unilateral primary osteoarthritis of first carpometacarpal joint, right hand: Secondary | ICD-10-CM | POA: Diagnosis not present

## 2015-12-06 DIAGNOSIS — M1812 Unilateral primary osteoarthritis of first carpometacarpal joint, left hand: Secondary | ICD-10-CM | POA: Diagnosis not present

## 2015-12-19 DIAGNOSIS — E118 Type 2 diabetes mellitus with unspecified complications: Secondary | ICD-10-CM | POA: Diagnosis not present

## 2016-01-01 DIAGNOSIS — G5601 Carpal tunnel syndrome, right upper limb: Secondary | ICD-10-CM | POA: Diagnosis not present

## 2016-01-22 DIAGNOSIS — R1084 Generalized abdominal pain: Secondary | ICD-10-CM | POA: Diagnosis not present

## 2016-01-22 DIAGNOSIS — R109 Unspecified abdominal pain: Secondary | ICD-10-CM | POA: Diagnosis not present

## 2016-01-22 DIAGNOSIS — I252 Old myocardial infarction: Secondary | ICD-10-CM | POA: Diagnosis not present

## 2016-01-22 DIAGNOSIS — J449 Chronic obstructive pulmonary disease, unspecified: Secondary | ICD-10-CM | POA: Diagnosis not present

## 2016-01-22 DIAGNOSIS — E119 Type 2 diabetes mellitus without complications: Secondary | ICD-10-CM | POA: Diagnosis not present

## 2016-01-22 DIAGNOSIS — Z86718 Personal history of other venous thrombosis and embolism: Secondary | ICD-10-CM | POA: Diagnosis not present

## 2016-01-22 DIAGNOSIS — Z9089 Acquired absence of other organs: Secondary | ICD-10-CM | POA: Diagnosis not present

## 2016-01-22 DIAGNOSIS — I2510 Atherosclerotic heart disease of native coronary artery without angina pectoris: Secondary | ICD-10-CM | POA: Diagnosis not present

## 2016-01-22 DIAGNOSIS — R1010 Upper abdominal pain, unspecified: Secondary | ICD-10-CM | POA: Diagnosis not present

## 2016-01-22 DIAGNOSIS — R100 Acute abdomen: Secondary | ICD-10-CM | POA: Diagnosis not present

## 2016-01-22 DIAGNOSIS — E1141 Type 2 diabetes mellitus with diabetic mononeuropathy: Secondary | ICD-10-CM | POA: Diagnosis not present

## 2016-01-22 DIAGNOSIS — T402X5A Adverse effect of other opioids, initial encounter: Secondary | ICD-10-CM | POA: Diagnosis not present

## 2016-01-22 DIAGNOSIS — K5900 Constipation, unspecified: Secondary | ICD-10-CM | POA: Diagnosis not present

## 2016-01-22 DIAGNOSIS — R112 Nausea with vomiting, unspecified: Secondary | ICD-10-CM | POA: Diagnosis not present

## 2016-01-22 DIAGNOSIS — E785 Hyperlipidemia, unspecified: Secondary | ICD-10-CM | POA: Diagnosis not present

## 2016-01-22 DIAGNOSIS — I10 Essential (primary) hypertension: Secondary | ICD-10-CM | POA: Diagnosis not present

## 2016-01-22 DIAGNOSIS — K439 Ventral hernia without obstruction or gangrene: Secondary | ICD-10-CM | POA: Diagnosis not present

## 2016-01-22 DIAGNOSIS — K567 Ileus, unspecified: Secondary | ICD-10-CM | POA: Diagnosis not present

## 2016-01-22 DIAGNOSIS — Z794 Long term (current) use of insulin: Secondary | ICD-10-CM | POA: Diagnosis not present

## 2016-01-23 DIAGNOSIS — F17200 Nicotine dependence, unspecified, uncomplicated: Secondary | ICD-10-CM

## 2016-01-23 DIAGNOSIS — I2510 Atherosclerotic heart disease of native coronary artery without angina pectoris: Secondary | ICD-10-CM

## 2016-01-23 DIAGNOSIS — Z87891 Personal history of nicotine dependence: Secondary | ICD-10-CM | POA: Insufficient documentation

## 2016-01-23 DIAGNOSIS — I10 Essential (primary) hypertension: Secondary | ICD-10-CM

## 2016-01-23 DIAGNOSIS — I25118 Atherosclerotic heart disease of native coronary artery with other forms of angina pectoris: Secondary | ICD-10-CM | POA: Insufficient documentation

## 2016-01-23 HISTORY — DX: Atherosclerotic heart disease of native coronary artery without angina pectoris: I25.10

## 2016-01-23 HISTORY — DX: Nicotine dependence, unspecified, uncomplicated: F17.200

## 2016-01-23 HISTORY — DX: Essential (primary) hypertension: I10

## 2016-01-23 HISTORY — DX: Atherosclerotic heart disease of native coronary artery with other forms of angina pectoris: I25.118

## 2016-01-29 DIAGNOSIS — E1142 Type 2 diabetes mellitus with diabetic polyneuropathy: Secondary | ICD-10-CM | POA: Diagnosis not present

## 2016-01-29 DIAGNOSIS — K5669 Other intestinal obstruction: Secondary | ICD-10-CM | POA: Diagnosis not present

## 2016-01-29 DIAGNOSIS — K567 Ileus, unspecified: Secondary | ICD-10-CM | POA: Diagnosis not present

## 2016-01-29 DIAGNOSIS — E1149 Type 2 diabetes mellitus with other diabetic neurological complication: Secondary | ICD-10-CM | POA: Diagnosis not present

## 2016-02-18 DIAGNOSIS — I5020 Unspecified systolic (congestive) heart failure: Secondary | ICD-10-CM | POA: Diagnosis not present

## 2016-02-18 DIAGNOSIS — G4733 Obstructive sleep apnea (adult) (pediatric): Secondary | ICD-10-CM | POA: Diagnosis not present

## 2016-02-18 DIAGNOSIS — I2510 Atherosclerotic heart disease of native coronary artery without angina pectoris: Secondary | ICD-10-CM | POA: Diagnosis not present

## 2016-02-25 DIAGNOSIS — K219 Gastro-esophageal reflux disease without esophagitis: Secondary | ICD-10-CM | POA: Diagnosis not present

## 2016-02-25 DIAGNOSIS — R0789 Other chest pain: Secondary | ICD-10-CM | POA: Diagnosis not present

## 2016-02-25 DIAGNOSIS — R072 Precordial pain: Secondary | ICD-10-CM | POA: Diagnosis not present

## 2016-02-25 DIAGNOSIS — Z7984 Long term (current) use of oral hypoglycemic drugs: Secondary | ICD-10-CM | POA: Diagnosis not present

## 2016-02-25 DIAGNOSIS — Z794 Long term (current) use of insulin: Secondary | ICD-10-CM | POA: Diagnosis not present

## 2016-02-25 DIAGNOSIS — R079 Chest pain, unspecified: Secondary | ICD-10-CM | POA: Diagnosis not present

## 2016-02-25 DIAGNOSIS — I252 Old myocardial infarction: Secondary | ICD-10-CM | POA: Diagnosis not present

## 2016-02-25 DIAGNOSIS — I2510 Atherosclerotic heart disease of native coronary artery without angina pectoris: Secondary | ICD-10-CM | POA: Diagnosis not present

## 2016-02-25 DIAGNOSIS — R1011 Right upper quadrant pain: Secondary | ICD-10-CM | POA: Diagnosis not present

## 2016-02-25 DIAGNOSIS — E119 Type 2 diabetes mellitus without complications: Secondary | ICD-10-CM | POA: Diagnosis not present

## 2016-02-25 DIAGNOSIS — R0602 Shortness of breath: Secondary | ICD-10-CM | POA: Diagnosis not present

## 2016-02-25 DIAGNOSIS — Z79899 Other long term (current) drug therapy: Secondary | ICD-10-CM | POA: Diagnosis not present

## 2016-02-25 DIAGNOSIS — I2582 Chronic total occlusion of coronary artery: Secondary | ICD-10-CM | POA: Diagnosis not present

## 2016-02-25 DIAGNOSIS — Z8711 Personal history of peptic ulcer disease: Secondary | ICD-10-CM | POA: Diagnosis not present

## 2016-02-25 DIAGNOSIS — Z87891 Personal history of nicotine dependence: Secondary | ICD-10-CM | POA: Diagnosis not present

## 2016-02-25 DIAGNOSIS — E7800 Pure hypercholesterolemia, unspecified: Secondary | ICD-10-CM | POA: Diagnosis not present

## 2016-02-25 DIAGNOSIS — I10 Essential (primary) hypertension: Secondary | ICD-10-CM | POA: Diagnosis not present

## 2016-02-25 DIAGNOSIS — G8929 Other chronic pain: Secondary | ICD-10-CM | POA: Diagnosis not present

## 2016-02-25 DIAGNOSIS — Z7982 Long term (current) use of aspirin: Secondary | ICD-10-CM | POA: Diagnosis not present

## 2016-02-26 DIAGNOSIS — I2510 Atherosclerotic heart disease of native coronary artery without angina pectoris: Secondary | ICD-10-CM | POA: Diagnosis not present

## 2016-02-26 DIAGNOSIS — I249 Acute ischemic heart disease, unspecified: Secondary | ICD-10-CM | POA: Diagnosis not present

## 2016-02-26 DIAGNOSIS — I119 Hypertensive heart disease without heart failure: Secondary | ICD-10-CM | POA: Diagnosis not present

## 2016-02-26 DIAGNOSIS — R079 Chest pain, unspecified: Secondary | ICD-10-CM | POA: Diagnosis not present

## 2016-02-26 DIAGNOSIS — E119 Type 2 diabetes mellitus without complications: Secondary | ICD-10-CM | POA: Diagnosis not present

## 2016-02-26 DIAGNOSIS — E7800 Pure hypercholesterolemia, unspecified: Secondary | ICD-10-CM | POA: Diagnosis not present

## 2016-02-27 DIAGNOSIS — I2582 Chronic total occlusion of coronary artery: Secondary | ICD-10-CM | POA: Diagnosis not present

## 2016-02-27 DIAGNOSIS — E669 Obesity, unspecified: Secondary | ICD-10-CM | POA: Diagnosis not present

## 2016-02-27 DIAGNOSIS — I119 Hypertensive heart disease without heart failure: Secondary | ICD-10-CM | POA: Diagnosis not present

## 2016-02-27 DIAGNOSIS — Z6841 Body Mass Index (BMI) 40.0 and over, adult: Secondary | ICD-10-CM | POA: Diagnosis not present

## 2016-02-27 DIAGNOSIS — E119 Type 2 diabetes mellitus without complications: Secondary | ICD-10-CM | POA: Diagnosis not present

## 2016-02-27 DIAGNOSIS — R0789 Other chest pain: Secondary | ICD-10-CM | POA: Diagnosis not present

## 2016-02-27 DIAGNOSIS — I209 Angina pectoris, unspecified: Secondary | ICD-10-CM | POA: Diagnosis not present

## 2016-02-27 DIAGNOSIS — I249 Acute ischemic heart disease, unspecified: Secondary | ICD-10-CM | POA: Diagnosis not present

## 2016-02-27 DIAGNOSIS — I2510 Atherosclerotic heart disease of native coronary artery without angina pectoris: Secondary | ICD-10-CM | POA: Diagnosis not present

## 2016-02-27 DIAGNOSIS — E7800 Pure hypercholesterolemia, unspecified: Secondary | ICD-10-CM | POA: Diagnosis not present

## 2016-03-02 DIAGNOSIS — I10 Essential (primary) hypertension: Secondary | ICD-10-CM | POA: Diagnosis not present

## 2016-03-02 DIAGNOSIS — E1149 Type 2 diabetes mellitus with other diabetic neurological complication: Secondary | ICD-10-CM | POA: Diagnosis not present

## 2016-03-02 DIAGNOSIS — E785 Hyperlipidemia, unspecified: Secondary | ICD-10-CM | POA: Diagnosis not present

## 2016-03-02 DIAGNOSIS — E1142 Type 2 diabetes mellitus with diabetic polyneuropathy: Secondary | ICD-10-CM | POA: Diagnosis not present

## 2016-03-02 DIAGNOSIS — I249 Acute ischemic heart disease, unspecified: Secondary | ICD-10-CM | POA: Diagnosis not present

## 2016-03-06 DIAGNOSIS — I10 Essential (primary) hypertension: Secondary | ICD-10-CM | POA: Diagnosis not present

## 2016-03-06 DIAGNOSIS — R931 Abnormal findings on diagnostic imaging of heart and coronary circulation: Secondary | ICD-10-CM | POA: Diagnosis not present

## 2016-03-06 DIAGNOSIS — E119 Type 2 diabetes mellitus without complications: Secondary | ICD-10-CM | POA: Diagnosis not present

## 2016-03-06 DIAGNOSIS — Z794 Long term (current) use of insulin: Secondary | ICD-10-CM | POA: Diagnosis not present

## 2016-03-06 DIAGNOSIS — I2510 Atherosclerotic heart disease of native coronary artery without angina pectoris: Secondary | ICD-10-CM | POA: Diagnosis not present

## 2016-03-16 DIAGNOSIS — R5383 Other fatigue: Secondary | ICD-10-CM | POA: Diagnosis not present

## 2016-03-16 DIAGNOSIS — F17210 Nicotine dependence, cigarettes, uncomplicated: Secondary | ICD-10-CM | POA: Diagnosis not present

## 2016-03-16 DIAGNOSIS — G4733 Obstructive sleep apnea (adult) (pediatric): Secondary | ICD-10-CM | POA: Diagnosis not present

## 2016-03-16 DIAGNOSIS — J4540 Moderate persistent asthma, uncomplicated: Secondary | ICD-10-CM | POA: Diagnosis not present

## 2016-03-17 DIAGNOSIS — E559 Vitamin D deficiency, unspecified: Secondary | ICD-10-CM | POA: Diagnosis not present

## 2016-04-03 DIAGNOSIS — G4733 Obstructive sleep apnea (adult) (pediatric): Secondary | ICD-10-CM | POA: Diagnosis not present

## 2016-04-10 DIAGNOSIS — I2510 Atherosclerotic heart disease of native coronary artery without angina pectoris: Secondary | ICD-10-CM | POA: Diagnosis not present

## 2016-04-10 DIAGNOSIS — E119 Type 2 diabetes mellitus without complications: Secondary | ICD-10-CM | POA: Diagnosis not present

## 2016-04-10 DIAGNOSIS — I10 Essential (primary) hypertension: Secondary | ICD-10-CM | POA: Diagnosis not present

## 2016-04-10 DIAGNOSIS — Z794 Long term (current) use of insulin: Secondary | ICD-10-CM | POA: Diagnosis not present

## 2016-04-13 DIAGNOSIS — G5601 Carpal tunnel syndrome, right upper limb: Secondary | ICD-10-CM | POA: Diagnosis not present

## 2016-04-13 DIAGNOSIS — Z4789 Encounter for other orthopedic aftercare: Secondary | ICD-10-CM | POA: Diagnosis not present

## 2016-04-13 DIAGNOSIS — R5383 Other fatigue: Secondary | ICD-10-CM | POA: Diagnosis not present

## 2016-04-13 DIAGNOSIS — J4540 Moderate persistent asthma, uncomplicated: Secondary | ICD-10-CM | POA: Diagnosis not present

## 2016-04-13 DIAGNOSIS — G5602 Carpal tunnel syndrome, left upper limb: Secondary | ICD-10-CM | POA: Diagnosis not present

## 2016-04-13 DIAGNOSIS — G4733 Obstructive sleep apnea (adult) (pediatric): Secondary | ICD-10-CM | POA: Diagnosis not present

## 2016-05-03 DIAGNOSIS — G4733 Obstructive sleep apnea (adult) (pediatric): Secondary | ICD-10-CM | POA: Diagnosis not present

## 2016-05-05 DIAGNOSIS — M65311 Trigger thumb, right thumb: Secondary | ICD-10-CM | POA: Diagnosis not present

## 2016-05-05 DIAGNOSIS — G5601 Carpal tunnel syndrome, right upper limb: Secondary | ICD-10-CM | POA: Diagnosis not present

## 2016-05-05 DIAGNOSIS — Z4789 Encounter for other orthopedic aftercare: Secondary | ICD-10-CM | POA: Diagnosis not present

## 2016-05-05 DIAGNOSIS — G5602 Carpal tunnel syndrome, left upper limb: Secondary | ICD-10-CM | POA: Diagnosis not present

## 2016-05-11 DIAGNOSIS — R5383 Other fatigue: Secondary | ICD-10-CM | POA: Diagnosis not present

## 2016-05-11 DIAGNOSIS — G4733 Obstructive sleep apnea (adult) (pediatric): Secondary | ICD-10-CM | POA: Diagnosis not present

## 2016-05-11 DIAGNOSIS — J4540 Moderate persistent asthma, uncomplicated: Secondary | ICD-10-CM | POA: Diagnosis not present

## 2016-05-13 DIAGNOSIS — S299XXA Unspecified injury of thorax, initial encounter: Secondary | ICD-10-CM | POA: Diagnosis not present

## 2016-05-13 DIAGNOSIS — R0789 Other chest pain: Secondary | ICD-10-CM | POA: Diagnosis not present

## 2016-05-13 DIAGNOSIS — S20211A Contusion of right front wall of thorax, initial encounter: Secondary | ICD-10-CM | POA: Diagnosis not present

## 2016-05-13 DIAGNOSIS — M940 Chondrocostal junction syndrome [Tietze]: Secondary | ICD-10-CM | POA: Diagnosis not present

## 2016-05-13 DIAGNOSIS — R079 Chest pain, unspecified: Secondary | ICD-10-CM | POA: Diagnosis not present

## 2016-05-13 DIAGNOSIS — R000 Tachycardia, unspecified: Secondary | ICD-10-CM | POA: Diagnosis not present

## 2016-05-18 DIAGNOSIS — G4733 Obstructive sleep apnea (adult) (pediatric): Secondary | ICD-10-CM | POA: Diagnosis not present

## 2016-05-26 DIAGNOSIS — E1149 Type 2 diabetes mellitus with other diabetic neurological complication: Secondary | ICD-10-CM | POA: Diagnosis not present

## 2016-05-26 DIAGNOSIS — Z01818 Encounter for other preprocedural examination: Secondary | ICD-10-CM | POA: Diagnosis not present

## 2016-06-03 DIAGNOSIS — E785 Hyperlipidemia, unspecified: Secondary | ICD-10-CM | POA: Diagnosis not present

## 2016-06-03 DIAGNOSIS — G4730 Sleep apnea, unspecified: Secondary | ICD-10-CM | POA: Diagnosis not present

## 2016-06-03 DIAGNOSIS — R000 Tachycardia, unspecified: Secondary | ICD-10-CM | POA: Diagnosis not present

## 2016-06-03 DIAGNOSIS — E119 Type 2 diabetes mellitus without complications: Secondary | ICD-10-CM | POA: Diagnosis not present

## 2016-06-03 DIAGNOSIS — R0789 Other chest pain: Secondary | ICD-10-CM | POA: Diagnosis not present

## 2016-06-03 DIAGNOSIS — Z7982 Long term (current) use of aspirin: Secondary | ICD-10-CM | POA: Diagnosis not present

## 2016-06-03 DIAGNOSIS — R0602 Shortness of breath: Secondary | ICD-10-CM | POA: Diagnosis not present

## 2016-06-03 DIAGNOSIS — I2510 Atherosclerotic heart disease of native coronary artery without angina pectoris: Secondary | ICD-10-CM | POA: Diagnosis not present

## 2016-06-03 DIAGNOSIS — Z79899 Other long term (current) drug therapy: Secondary | ICD-10-CM | POA: Diagnosis not present

## 2016-06-03 DIAGNOSIS — R079 Chest pain, unspecified: Secondary | ICD-10-CM | POA: Diagnosis not present

## 2016-06-03 DIAGNOSIS — I252 Old myocardial infarction: Secondary | ICD-10-CM | POA: Diagnosis not present

## 2016-06-03 DIAGNOSIS — R05 Cough: Secondary | ICD-10-CM | POA: Diagnosis not present

## 2016-06-03 DIAGNOSIS — Z794 Long term (current) use of insulin: Secondary | ICD-10-CM | POA: Diagnosis not present

## 2016-06-03 DIAGNOSIS — I10 Essential (primary) hypertension: Secondary | ICD-10-CM | POA: Diagnosis not present

## 2016-07-15 DIAGNOSIS — E109 Type 1 diabetes mellitus without complications: Secondary | ICD-10-CM | POA: Diagnosis not present

## 2016-07-15 DIAGNOSIS — E119 Type 2 diabetes mellitus without complications: Secondary | ICD-10-CM | POA: Diagnosis not present

## 2016-07-15 DIAGNOSIS — Z794 Long term (current) use of insulin: Secondary | ICD-10-CM | POA: Diagnosis not present

## 2016-07-15 DIAGNOSIS — I2510 Atherosclerotic heart disease of native coronary artery without angina pectoris: Secondary | ICD-10-CM | POA: Diagnosis not present

## 2016-07-15 DIAGNOSIS — R0602 Shortness of breath: Secondary | ICD-10-CM | POA: Diagnosis not present

## 2016-07-15 DIAGNOSIS — F17200 Nicotine dependence, unspecified, uncomplicated: Secondary | ICD-10-CM | POA: Diagnosis not present

## 2016-07-15 DIAGNOSIS — I10 Essential (primary) hypertension: Secondary | ICD-10-CM | POA: Diagnosis not present

## 2016-07-15 DIAGNOSIS — J449 Chronic obstructive pulmonary disease, unspecified: Secondary | ICD-10-CM | POA: Diagnosis not present

## 2016-07-15 DIAGNOSIS — E782 Mixed hyperlipidemia: Secondary | ICD-10-CM | POA: Diagnosis not present

## 2016-07-21 DIAGNOSIS — H2513 Age-related nuclear cataract, bilateral: Secondary | ICD-10-CM | POA: Diagnosis not present

## 2016-07-21 DIAGNOSIS — E119 Type 2 diabetes mellitus without complications: Secondary | ICD-10-CM | POA: Diagnosis not present

## 2016-07-29 DIAGNOSIS — F17200 Nicotine dependence, unspecified, uncomplicated: Secondary | ICD-10-CM | POA: Diagnosis not present

## 2016-07-29 DIAGNOSIS — F17208 Nicotine dependence, unspecified, with other nicotine-induced disorders: Secondary | ICD-10-CM | POA: Diagnosis not present

## 2016-07-29 DIAGNOSIS — M25519 Pain in unspecified shoulder: Secondary | ICD-10-CM | POA: Diagnosis not present

## 2016-08-18 DIAGNOSIS — H40003 Preglaucoma, unspecified, bilateral: Secondary | ICD-10-CM | POA: Diagnosis not present

## 2016-08-18 DIAGNOSIS — Z0100 Encounter for examination of eyes and vision without abnormal findings: Secondary | ICD-10-CM | POA: Diagnosis not present

## 2016-08-18 DIAGNOSIS — H2513 Age-related nuclear cataract, bilateral: Secondary | ICD-10-CM | POA: Diagnosis not present

## 2016-08-19 DIAGNOSIS — G5602 Carpal tunnel syndrome, left upper limb: Secondary | ICD-10-CM | POA: Diagnosis not present

## 2016-09-04 DIAGNOSIS — G5602 Carpal tunnel syndrome, left upper limb: Secondary | ICD-10-CM | POA: Diagnosis not present

## 2016-09-23 DIAGNOSIS — E118 Type 2 diabetes mellitus with unspecified complications: Secondary | ICD-10-CM | POA: Diagnosis not present

## 2016-10-08 DIAGNOSIS — M25569 Pain in unspecified knee: Secondary | ICD-10-CM | POA: Diagnosis not present

## 2016-10-08 DIAGNOSIS — G4733 Obstructive sleep apnea (adult) (pediatric): Secondary | ICD-10-CM | POA: Diagnosis not present

## 2016-11-05 DIAGNOSIS — F17200 Nicotine dependence, unspecified, uncomplicated: Secondary | ICD-10-CM | POA: Diagnosis not present

## 2016-11-05 DIAGNOSIS — E119 Type 2 diabetes mellitus without complications: Secondary | ICD-10-CM | POA: Diagnosis not present

## 2016-11-05 DIAGNOSIS — I10 Essential (primary) hypertension: Secondary | ICD-10-CM | POA: Diagnosis not present

## 2016-11-05 DIAGNOSIS — I2510 Atherosclerotic heart disease of native coronary artery without angina pectoris: Secondary | ICD-10-CM | POA: Diagnosis not present

## 2016-11-05 DIAGNOSIS — Z794 Long term (current) use of insulin: Secondary | ICD-10-CM | POA: Diagnosis not present

## 2016-11-09 DIAGNOSIS — G4733 Obstructive sleep apnea (adult) (pediatric): Secondary | ICD-10-CM | POA: Diagnosis not present

## 2016-11-16 DIAGNOSIS — M65311 Trigger thumb, right thumb: Secondary | ICD-10-CM | POA: Diagnosis not present

## 2016-11-16 DIAGNOSIS — M65841 Other synovitis and tenosynovitis, right hand: Secondary | ICD-10-CM | POA: Diagnosis not present

## 2016-12-22 DIAGNOSIS — E785 Hyperlipidemia, unspecified: Secondary | ICD-10-CM | POA: Diagnosis not present

## 2016-12-22 DIAGNOSIS — R072 Precordial pain: Secondary | ICD-10-CM | POA: Diagnosis not present

## 2016-12-22 DIAGNOSIS — E119 Type 2 diabetes mellitus without complications: Secondary | ICD-10-CM | POA: Diagnosis not present

## 2016-12-22 DIAGNOSIS — I252 Old myocardial infarction: Secondary | ICD-10-CM | POA: Diagnosis not present

## 2016-12-22 DIAGNOSIS — R0602 Shortness of breath: Secondary | ICD-10-CM | POA: Diagnosis not present

## 2016-12-22 DIAGNOSIS — Z794 Long term (current) use of insulin: Secondary | ICD-10-CM | POA: Diagnosis not present

## 2016-12-22 DIAGNOSIS — G4730 Sleep apnea, unspecified: Secondary | ICD-10-CM | POA: Diagnosis not present

## 2016-12-22 DIAGNOSIS — I2510 Atherosclerotic heart disease of native coronary artery without angina pectoris: Secondary | ICD-10-CM | POA: Diagnosis not present

## 2016-12-22 DIAGNOSIS — I10 Essential (primary) hypertension: Secondary | ICD-10-CM | POA: Diagnosis not present

## 2016-12-22 DIAGNOSIS — R079 Chest pain, unspecified: Secondary | ICD-10-CM | POA: Diagnosis not present

## 2016-12-23 DIAGNOSIS — I2510 Atherosclerotic heart disease of native coronary artery without angina pectoris: Secondary | ICD-10-CM | POA: Diagnosis not present

## 2016-12-23 DIAGNOSIS — I10 Essential (primary) hypertension: Secondary | ICD-10-CM | POA: Diagnosis not present

## 2016-12-23 DIAGNOSIS — R072 Precordial pain: Secondary | ICD-10-CM | POA: Diagnosis not present

## 2016-12-23 DIAGNOSIS — F17200 Nicotine dependence, unspecified, uncomplicated: Secondary | ICD-10-CM | POA: Diagnosis not present

## 2016-12-23 DIAGNOSIS — R0789 Other chest pain: Secondary | ICD-10-CM | POA: Diagnosis not present

## 2016-12-24 DIAGNOSIS — R0602 Shortness of breath: Secondary | ICD-10-CM | POA: Diagnosis not present

## 2016-12-24 DIAGNOSIS — I10 Essential (primary) hypertension: Secondary | ICD-10-CM | POA: Diagnosis not present

## 2016-12-24 DIAGNOSIS — R0789 Other chest pain: Secondary | ICD-10-CM | POA: Diagnosis not present

## 2016-12-24 DIAGNOSIS — E785 Hyperlipidemia, unspecified: Secondary | ICD-10-CM | POA: Diagnosis not present

## 2016-12-24 DIAGNOSIS — I252 Old myocardial infarction: Secondary | ICD-10-CM | POA: Diagnosis not present

## 2016-12-24 DIAGNOSIS — R079 Chest pain, unspecified: Secondary | ICD-10-CM | POA: Diagnosis not present

## 2016-12-24 DIAGNOSIS — F10120 Alcohol abuse with intoxication, uncomplicated: Secondary | ICD-10-CM | POA: Diagnosis not present

## 2016-12-24 DIAGNOSIS — E119 Type 2 diabetes mellitus without complications: Secondary | ICD-10-CM | POA: Diagnosis not present

## 2016-12-24 DIAGNOSIS — I2510 Atherosclerotic heart disease of native coronary artery without angina pectoris: Secondary | ICD-10-CM | POA: Diagnosis not present

## 2016-12-24 DIAGNOSIS — F1010 Alcohol abuse, uncomplicated: Secondary | ICD-10-CM | POA: Diagnosis not present

## 2016-12-24 DIAGNOSIS — K439 Ventral hernia without obstruction or gangrene: Secondary | ICD-10-CM | POA: Diagnosis not present

## 2016-12-25 DIAGNOSIS — F10239 Alcohol dependence with withdrawal, unspecified: Secondary | ICD-10-CM | POA: Diagnosis not present

## 2016-12-25 DIAGNOSIS — F1123 Opioid dependence with withdrawal: Secondary | ICD-10-CM | POA: Diagnosis not present

## 2016-12-25 DIAGNOSIS — F1010 Alcohol abuse, uncomplicated: Secondary | ICD-10-CM | POA: Diagnosis not present

## 2016-12-25 DIAGNOSIS — F1110 Opioid abuse, uncomplicated: Secondary | ICD-10-CM | POA: Diagnosis not present

## 2016-12-28 ENCOUNTER — Encounter (HOSPITAL_COMMUNITY): Payer: Self-pay

## 2016-12-28 DIAGNOSIS — I2510 Atherosclerotic heart disease of native coronary artery without angina pectoris: Secondary | ICD-10-CM | POA: Diagnosis not present

## 2016-12-28 DIAGNOSIS — Z7982 Long term (current) use of aspirin: Secondary | ICD-10-CM | POA: Insufficient documentation

## 2016-12-28 DIAGNOSIS — F17210 Nicotine dependence, cigarettes, uncomplicated: Secondary | ICD-10-CM | POA: Diagnosis not present

## 2016-12-28 DIAGNOSIS — F11129 Opioid abuse with intoxication, unspecified: Secondary | ICD-10-CM | POA: Diagnosis not present

## 2016-12-28 DIAGNOSIS — F1010 Alcohol abuse, uncomplicated: Secondary | ICD-10-CM | POA: Diagnosis not present

## 2016-12-28 DIAGNOSIS — I252 Old myocardial infarction: Secondary | ICD-10-CM | POA: Insufficient documentation

## 2016-12-28 DIAGNOSIS — Z7901 Long term (current) use of anticoagulants: Secondary | ICD-10-CM | POA: Insufficient documentation

## 2016-12-28 DIAGNOSIS — F1110 Opioid abuse, uncomplicated: Secondary | ICD-10-CM | POA: Insufficient documentation

## 2016-12-28 DIAGNOSIS — E119 Type 2 diabetes mellitus without complications: Secondary | ICD-10-CM | POA: Insufficient documentation

## 2016-12-28 DIAGNOSIS — I10 Essential (primary) hypertension: Secondary | ICD-10-CM | POA: Diagnosis not present

## 2016-12-28 DIAGNOSIS — F10230 Alcohol dependence with withdrawal, uncomplicated: Secondary | ICD-10-CM | POA: Diagnosis not present

## 2016-12-28 DIAGNOSIS — Z79899 Other long term (current) drug therapy: Secondary | ICD-10-CM | POA: Diagnosis not present

## 2016-12-28 DIAGNOSIS — F1910 Other psychoactive substance abuse, uncomplicated: Secondary | ICD-10-CM | POA: Diagnosis not present

## 2016-12-28 DIAGNOSIS — R072 Precordial pain: Secondary | ICD-10-CM | POA: Diagnosis not present

## 2016-12-28 LAB — COMPREHENSIVE METABOLIC PANEL WITH GFR
ALT: 16 U/L — ABNORMAL LOW (ref 17–63)
AST: 19 U/L (ref 15–41)
Albumin: 4 g/dL (ref 3.5–5.0)
Alkaline Phosphatase: 54 U/L (ref 38–126)
Anion gap: 11 (ref 5–15)
BUN: 8 mg/dL (ref 6–20)
CO2: 21 mmol/L — ABNORMAL LOW (ref 22–32)
Calcium: 9.3 mg/dL (ref 8.9–10.3)
Chloride: 106 mmol/L (ref 101–111)
Creatinine, Ser: 1.01 mg/dL (ref 0.61–1.24)
GFR calc Af Amer: >60 mL/min
GFR calc non Af Amer: >60 mL/min
Glucose, Bld: 105 mg/dL — ABNORMAL HIGH (ref 65–99)
Potassium: 3.7 mmol/L (ref 3.5–5.1)
Sodium: 138 mmol/L (ref 135–145)
Total Bilirubin: 0.5 mg/dL (ref 0.3–1.2)
Total Protein: 6.7 g/dL (ref 6.5–8.1)

## 2016-12-28 LAB — ETHANOL: Alcohol, Ethyl (B): 82 mg/dL — ABNORMAL HIGH

## 2016-12-28 LAB — URINALYSIS, ROUTINE W REFLEX MICROSCOPIC
Bilirubin Urine: NEGATIVE
Glucose, UA: NEGATIVE mg/dL
Ketones, ur: NEGATIVE mg/dL
Leukocytes, UA: NEGATIVE
Nitrite: NEGATIVE
Protein, ur: NEGATIVE mg/dL
RBC / HPF: NONE SEEN RBC/hpf (ref 0–5)
Specific Gravity, Urine: 1.016 (ref 1.005–1.030)
pH: 5 (ref 5.0–8.0)

## 2016-12-28 LAB — CBC
HCT: 43.3 % (ref 39.0–52.0)
Hemoglobin: 14 g/dL (ref 13.0–17.0)
MCH: 30.8 pg (ref 26.0–34.0)
MCHC: 32.3 g/dL (ref 30.0–36.0)
MCV: 95.2 fL (ref 78.0–100.0)
Platelets: 384 K/uL (ref 150–400)
RBC: 4.55 MIL/uL (ref 4.22–5.81)
RDW: 13.5 % (ref 11.5–15.5)
WBC: 9.8 K/uL (ref 4.0–10.5)

## 2016-12-28 LAB — LIPASE, BLOOD: Lipase: 25 U/L (ref 11–51)

## 2016-12-28 LAB — SALICYLATE LEVEL: Salicylate Lvl: <7 mg/dL (ref 2.8–30.0)

## 2016-12-28 LAB — ACETAMINOPHEN LEVEL

## 2016-12-28 NOTE — ED Notes (Addendum)
After speaking with patient, there was a misunderstanding between this RN and patient when addressing suicidal ideation. Pt denies any type of suicidal or homicidal ideation and just wishes to be examined and get detox from ETOH.

## 2016-12-28 NOTE — ED Triage Notes (Signed)
Pt sent here by PCP for alcohol dependence. Pt seeking detox. Pt has also had abdominal pain X2 weeks. Last drink today.

## 2016-12-29 ENCOUNTER — Emergency Department (HOSPITAL_COMMUNITY)
Admission: EM | Admit: 2016-12-29 | Discharge: 2016-12-29 | Disposition: A | Discharge status: Home / Self Care | Payer: Medicare HMO | Attending: Emergency Medicine | Admitting: Emergency Medicine

## 2016-12-29 DIAGNOSIS — F1010 Alcohol abuse, uncomplicated: Secondary | ICD-10-CM

## 2016-12-29 DIAGNOSIS — F17210 Nicotine dependence, cigarettes, uncomplicated: Secondary | ICD-10-CM

## 2016-12-29 DIAGNOSIS — F1910 Other psychoactive substance abuse, uncomplicated: Secondary | ICD-10-CM | POA: Insufficient documentation

## 2016-12-29 DIAGNOSIS — R443 Hallucinations, unspecified: Secondary | ICD-10-CM

## 2016-12-29 DIAGNOSIS — F1110 Opioid abuse, uncomplicated: Secondary | ICD-10-CM | POA: Insufficient documentation

## 2016-12-29 LAB — RAPID URINE DRUG SCREEN, HOSP PERFORMED
Amphetamines: NOT DETECTED
BARBITURATES: NOT DETECTED
BENZODIAZEPINES: NOT DETECTED
Cocaine: NOT DETECTED
Opiates: NOT DETECTED
TETRAHYDROCANNABINOL: NOT DETECTED

## 2016-12-29 MED ORDER — THIAMINE HCL 100 MG/ML IJ SOLN: 100.0000 mg | Freq: Every day | INTRAMUSCULAR | Status: DC

## 2016-12-29 MED ORDER — DICYCLOMINE HCL 20 MG PO TABS: 20.0000 mg | ORAL_TABLET | Freq: Four times a day (QID) | ORAL | Status: DC | PRN

## 2016-12-29 MED ORDER — LORAZEPAM 1 MG PO TABS
0.0000 mg | ORAL_TABLET | Freq: Four times a day (QID) | ORAL | Status: DC
  Administered 2016-12-29 (×2): 1 mg via ORAL
  Filled 2016-12-29 (×2): qty 1

## 2016-12-29 MED ORDER — CLONIDINE HCL 0.1 MG PO TABS: 0.1000 mg | ORAL_TABLET | Freq: Every day | ORAL | Status: DC

## 2016-12-29 MED ORDER — METHOCARBAMOL 500 MG PO TABS: 500.0000 mg | ORAL_TABLET | Freq: Three times a day (TID) | ORAL | Status: DC | PRN

## 2016-12-29 MED ORDER — VITAMIN B-1 100 MG PO TABS
100.0000 mg | ORAL_TABLET | Freq: Every day | ORAL | Status: DC
  Administered 2016-12-29: 100 mg via ORAL
  Filled 2016-12-29: qty 1

## 2016-12-29 MED ORDER — LOPERAMIDE HCL 2 MG PO CAPS: 2.0000 mg | ORAL_CAPSULE | ORAL | Status: DC | PRN

## 2016-12-29 MED ORDER — CLONIDINE HCL 0.1 MG PO TABS: 0.1000 mg | ORAL_TABLET | Freq: Two times a day (BID) | ORAL | Status: DC

## 2016-12-29 MED ORDER — ONDANSETRON 4 MG PO TBDP: 4.0000 mg | ORAL_TABLET | Freq: Four times a day (QID) | ORAL | Status: DC | PRN

## 2016-12-29 MED ORDER — HYDROXYZINE HCL 25 MG PO TABS: 25.0000 mg | ORAL_TABLET | Freq: Four times a day (QID) | ORAL | Status: DC | PRN

## 2016-12-29 MED ORDER — LORAZEPAM 2 MG/ML IJ SOLN: 0.0000 mg | Freq: Two times a day (BID) | INTRAMUSCULAR | Status: DC

## 2016-12-29 MED ORDER — CLONIDINE HCL 0.1 MG PO TABS
0.1000 mg | ORAL_TABLET | Freq: Four times a day (QID) | ORAL | Status: DC
  Administered 2016-12-29: 0.1 mg via ORAL
  Filled 2016-12-29: qty 1

## 2016-12-29 MED ORDER — LORAZEPAM 1 MG PO TABS: 0.0000 mg | ORAL_TABLET | Freq: Two times a day (BID) | ORAL | Status: DC

## 2016-12-29 MED ORDER — LORAZEPAM 2 MG/ML IJ SOLN: 0.0000 mg | Freq: Four times a day (QID) | INTRAMUSCULAR | Status: DC

## 2016-12-29 NOTE — ED Notes (Addendum)
Pt consulting with TTS. 

## 2016-12-29 NOTE — ED Notes (Signed)
TTS complete 

## 2016-12-29 NOTE — ED Provider Notes (Signed)
[facility] DEPT Provider Note   CSN: [id number] Arrival date & time: 12/28/16  1810     History   Chief Complaint Chief Complaint  Patient presents with  . Alcohol Problem    HPI Brent Ortiz is a 55 y.o. male with a hx of Coronary disease, diabetes, gout, hypertension, MI, chronic chest pain presents to the Emergency Department directly from his primary care physician with a note requesting admission for detox. Patient reports he drinks 1/2 L of alcohol per day and takes several oxycodone 10 mg tablets. He reports that when he doesn't have alcohol or oxycodone he develops abdominal pain, diarrhea and sweating. He reports he's been abusing these occasions and alcohol for more than 30 years. He reports that he has never tried to detox.  He has never had a withdrawal seizure. He reports he currently has mild abdominal cramping and has had 2 episodes of diarrhea today. Nonbloody and without melena. Patient reports he otherwise feels well. He currently denies chest pain or abdominal pain.  Patient is accompanied by an office chart from his primary care stating that he requires admission.  Patient adamantly denies suicidal or homicidal ideation. He denies auditory or visual hallucinations.   The history is provided by the patient and medical records. No language interpreter was used.    Past Medical History:  Diagnosis Date  . Coronary artery disease   . Diabetes mellitus    niddm  . Gout   . HLD (hyperlipidemia)   . HTN (hypertension)   . Myocardial infarction ([place])    04-13-2011  . Osteoarthritis   . Poor dentition     Patient Active Problem List   Diagnosis Date Noted  . Streptococcus pneumoniae infection 09/29/2011  . Thrombocytosis ([place]) 09/29/2011  . PNA (pneumonia) 09/04/2011  . 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 Hyponatremia 09/04/2011  . Leukocytosis 09/04/2011  . S/P left knee revision 06/23/2011    Past Surgical History:  Procedure  Laterality Date  . HERNIA REPAIR  2009  . I&D KNEE WITH POLY EXCHANGE  09/03/2011   Procedure: IRRIGATION AND DEBRIDEMENT KNEE WITH POLY EXCHANGE;  Surgeon: Mauri Pole, MD;  Location: [facility] ORS;  Service: Orthopedics;  Laterality: Left;  . TOTAL KNEE ARTHROPLASTY   right  feb 2012   left march 2012 r  . TOTAL KNEE REVISION  06/23/2011   Procedure: TOTAL KNEE REVISION;  Surgeon: Mauri Pole;  Location: [facility] ORS;  Service: Orthopedics;  Laterality: Left;  femomal nerve block done in holding area without incident       Home Medications    Prior to Admission medications   Medication Sig Start Date End Date Taking? Authorizing Provider  acetaminophen (TYLENOL) 500 MG tablet Take 1,000 mg by mouth every 6 (six) hours as needed. For pain.    [provider]  amLODipine (NORVASC) 2.5 MG tablet Take 2.5 mg by mouth every morning.     [provider]  amoxicillin (AMOXIL) 500 MG tablet Take 1 tablet (500 mg total) by mouth 3 (three) times daily. 02/17/12   Truman Hayward, MD  aspirin 81 MG tablet Take 81 mg by mouth daily.    [provider]  colchicine-probenecid 0.5-500 MG per tablet Take 1 tablet by mouth 2 (two) times daily.    [provider]  lisinopril (PRINIVIL,ZESTRIL) 10 MG tablet Take 10 mg by mouth every morning.     [provider]  metoprolol (TOPROL-XL) 200 MG 24  hr tablet Take 200 mg by mouth every morning.     [provider]  nitroGLYCERIN (NITROSTAT) 0.4 MG SL tablet Place 0.4 mg under the tongue every 5 (five) minutes as needed. For chest pain    [provider]  pantoprazole (PROTONIX) 40 MG tablet Take 40 mg by mouth daily as needed.     [provider]  simvastatin (ZOCOR) 40 MG tablet Take 40 mg by mouth every evening.    [provider]  sitaGLIPtin (JANUVIA) 100 MG tablet Take 100 mg by mouth every morning.     [provider]  sodium chloride 0.9 % SOLN 500 mL with vancomycin  1000 MG SOLR 1,500 mg Inject 1,500 mg into the vein every 12 (twelve) hours. 09/07/11   Michel Bickers, MD  warfarin (COUMADIN) 5 MG tablet Take 7.5 mg by mouth at bedtime.    [provider]    Family History Family History  Problem Relation Age of Onset  . Colon cancer Unknown     Social History Social History  Substance Use Topics  . Smoking status: Current Every Day Smoker    Packs/day: 1.00    Years: 37.00    Types: Cigarettes  . Smokeless tobacco: Never Used  . Alcohol use Yes     Allergies   Motrin [ibuprofen]   Review of Systems Review of Systems  Constitutional: Negative for diaphoresis.  HENT: Negative for congestion.   Eyes: Negative for visual disturbance.  Respiratory: Negative for chest tightness and shortness of breath.   Cardiovascular: Negative for chest pain.  Gastrointestinal: Positive for diarrhea. Negative for abdominal pain, blood in stool, nausea and vomiting.  Genitourinary: Negative for dysuria.  Musculoskeletal: Negative for back pain.  Neurological: Negative for tremors.  Hematological: Negative for adenopathy.  Psychiatric/Behavioral: Negative for agitation, decreased concentration and hallucinations. The patient is not nervous/anxious.   All other systems reviewed and are negative.    Physical Exam Updated Vital Signs BP (!) 170/105   Pulse 81   Temp 99.1 F (37.3 C) (Oral)   Resp 18   Wt 134.3 kg (296 lb)   SpO2 96%   BMI 45.01 kg/m   Physical Exam  Constitutional: He appears well-developed and well-nourished. No distress.  Awake, alert, nontoxic appearance Obese male  HENT:  Head: Normocephalic and atraumatic.  Mouth/Throat: Oropharynx is clear and moist. No oropharyngeal exudate.  Eyes: Conjunctivae are normal. No scleral icterus.  Neck: Normal range of motion. Neck supple.  Cardiovascular: Normal rate, regular rhythm and intact distal pulses.   Pulmonary/Chest: Effort normal and breath sounds normal. No  respiratory distress. He has no wheezes.  Equal chest expansion  Abdominal: Soft. Bowel sounds are normal. He exhibits no mass. There is no tenderness. There is no rebound and no guarding.  Musculoskeletal: Normal range of motion. He exhibits no edema.  Neurological: He is alert.  Speech is clear and goal oriented Moves extremities without ataxia No tremor  Skin: Skin is warm and dry. He is not diaphoretic.  Warm and dry, no diaphoresis  Psychiatric: He has a normal mood and affect. His mood appears not anxious. He expresses no homicidal and no suicidal ideation. He expresses no suicidal plans and no homicidal plans.  Nursing note and vitals reviewed.    ED Treatments / Results  Labs (all labs ordered are listed, but only abnormal results are displayed) Labs Reviewed  COMPREHENSIVE METABOLIC PANEL - Abnormal; Notable for the following:       Result Value  CO2 21 (*)    Glucose, Bld 105 (*)    ALT 16 (*)    All other components within normal limits  URINALYSIS, ROUTINE W REFLEX MICROSCOPIC - Abnormal; Notable for the following:    Hgb urine dipstick SMALL (*)    Bacteria, UA RARE (*)    Squamous Epithelial / LPF 0-5 (*)    All other components within normal limits  ETHANOL - Abnormal; Notable for the following:    Alcohol, Ethyl (B) 82 (*)    All other components within normal limits  ACETAMINOPHEN LEVEL - Abnormal; Notable for the following:    Acetaminophen (Tylenol), Serum <10 (*)    All other components within normal limits  LIPASE, BLOOD  CBC  SALICYLATE LEVEL    Procedures Procedures (including critical care time)  Medications Ordered in ED Medications  cloNIDine (CATAPRES) tablet 0.1 mg (not administered)    Followed by  cloNIDine (CATAPRES) tablet 0.1 mg (not administered)    Followed by  cloNIDine (CATAPRES) tablet 0.1 mg (not administered)  dicyclomine (BENTYL) tablet 20 mg (not administered)  hydrOXYzine (ATARAX/VISTARIL) tablet 25 mg (not administered)    loperamide (IMODIUM) capsule 2-4 mg (not administered)  methocarbamol (ROBAXIN) tablet 500 mg (not administered)  ondansetron (ZOFRAN-ODT) disintegrating tablet 4 mg (not administered)  LORazepam (ATIVAN) injection 0-4 mg (not administered)    Or  LORazepam (ATIVAN) tablet 0-4 mg (not administered)  LORazepam (ATIVAN) injection 0-4 mg (not administered)    Or  LORazepam (ATIVAN) tablet 0-4 mg (not administered)  thiamine (VITAMIN B-1) tablet 100 mg (not administered)    Or  thiamine (B-1) injection 100 mg (not administered)     Initial Impression / Assessment and Plan / ED Course  I have reviewed the triage vital signs and the nursing notes.  Pertinent labs & imaging results that were available during my care of the patient were reviewed by me and considered in my medical decision making (see chart for details).     Patient presents from primary care for alcohol and opiate detox. He was told by his primary care physician that he would be admitted for this. Patient's physical exam is reassuring and he is not exhibiting signs of severe alcohol withdrawal. He has never had a withdrawal seizure. I do not believe that he needs medical admission at this time. Patient has never attempted detox. He is not suicidal or homicidal. Patient will be evaluated by TTS. He is medically cleared at this time.  Final Clinical Impressions(s) / ED Diagnoses   Final diagnoses:  Alcohol abuse  Polysubstance abuse  Opiate abuse, continuous    New Prescriptions New Prescriptions   No medications on file     Agapito Games 12/30/16 0449    Merryl Hacker, MD 12/30/16 [phone]

## 2016-12-29 NOTE — Consult Note (Signed)
Telepsych Consultation   Reason for Consult: Consult for Brent Ortiz, a 56 y.o. Male admitted for alcohol detox. Referring Physician: EDP Patient Identification: Brent Ortiz MRN:  [id number] Principal Diagnosis: <principal problem not specified> Diagnosis:   Patient Active Problem List   Diagnosis Date Noted  . Alcohol abuse [F10.10]   . Opiate abuse, continuous [F11.10]   . Polysubstance abuse [F19.10]   . Streptococcus pneumoniae infection [A49.1] 09/29/2011  . Thrombocytosis ([place]) [D47.3] 09/29/2011  . PNA (pneumonia) [J18.9] 09/04/2011  . 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 Hyponatremia [E87.1] 09/04/2011  . Leukocytosis [D72.829] 09/04/2011  . S/P left knee revision [Z96.659] 06/23/2011    Total Time spent with patient: 30 minutes  Subjective:   Brent Ortiz is a 56 y.o. male patient admitted for alchol detox and opiates abuse.  HPI: Per the assessment completed by Alycia Patten on 12/30/14: Brent Ortiz is an 56 y.o. male reporting to ED for Alcohol Detox.   He denies SI/HI and does not endorse AVH. He states he is in a lot of pain.  He denies having a therapist and states his is not under medication management for psychotropic medication.  Brent Ortiz states his is married and lives with his wife and children.  He states he can return home once he is discharge.  He did not indicate and significant stressors contributing to his current state.  He denies any legal issues or any upcoming court dates.  Brent Ortiz presented in a hospital gown with an unremarkable appearance.  His eye contact was fair and his language was coherent and logical. Brent Ortiz had freedom of movement.  Brent Ortiz appeared to be in a lot of pain and discomfort.  His mood was pleasant and appropriate for the circumstances .  His mood was irritable. His affect was congruent with his mood.  His thought process and thought content was logical. His judgement did not appear  impaired and his insight was good.  He was 4 x's oriented to people, place, time, and situation.  On Exam: Patient was seen, chart reviewed with treatment team. Patient was in bed awake, alert and oriented. He reiterated the reason for this hospital admission as document above. He stated that he wants help to detox from his opiates use. He stated that he got hooked with drugs after he had surgery and has not been able to stop. Patient denies any suicide/homicide ideations. He reported he's being prescribed oxycodone 10/341m bid prn but he take more than that. Patient denies HI/VH, reports sees things sometimes but usually after taking drugs. Patient is requesting to go for inpatient treatment program for opiates detox.   Past Psychiatric History: See H&P  Risk to Self: Suicidal Ideation: No Suicidal Intent: No Is patient at risk for suicide?: Yes Suicidal Plan?: No Access to Means: No What has been your use of drugs/alcohol within the last 12 months?: yes How many times?: 0 Other Self Harm Risks: 0 Triggers for Past Attempts: None known Intentional Self Injurious Behavior: None Risk to Others: Homicidal Ideation: No Thoughts of Harm to Others: No Current Homicidal Intent: No Current Homicidal Plan: No Access to Homicidal Means: No Identified Victim: NA History of harm to others?: No Assessment of Violence: None Noted Violent Behavior Description: None indicated Does patient have access to weapons?: No Criminal Charges Pending?: No Does patient have a court date: No Prior Inpatient Therapy: Prior Inpatient Therapy: No Prior Therapy Dates: None Prior Therapy Facilty/Provider(s): None Prior Outpatient Therapy:  Prior Outpatient Therapy: Yes Prior Therapy Facilty/Provider(s): None reported Reason for Treatment: na Does patient have an ACCT team?: No Does patient have Intensive In-House Services?  : No Does patient have [facility] services? : No Does patient have P4CC services?:  No  Past Medical History:  Past Medical History:  Diagnosis Date  . Coronary artery disease   . Diabetes mellitus    niddm  . Gout   . HLD (hyperlipidemia)   . HTN (hypertension)   . Myocardial infarction ([place])    04-13-2011  . Osteoarthritis   . Poor dentition     Past Surgical History:  Procedure Laterality Date  . HERNIA REPAIR  2009  . I&D KNEE WITH POLY EXCHANGE  09/03/2011   Procedure: IRRIGATION AND DEBRIDEMENT KNEE WITH POLY EXCHANGE;  Surgeon: Mauri Pole, MD;  Location: [facility];  Service: Orthopedics;  Laterality: Left;  . TOTAL KNEE ARTHROPLASTY   right  feb 2012   left march 2012 r  . TOTAL KNEE REVISION  06/23/2011   Procedure: TOTAL KNEE REVISION;  Surgeon: Mauri Pole;  Location: [facility];  Service: Orthopedics;  Laterality: Left;  femomal nerve block done in holding area without incident   Family History:  Family History  Problem Relation Age of Onset  . Colon cancer Unknown    Family Psychiatric  History: Unknown  Social History:  History  Alcohol Use  . Yes     History  Drug Use No    Social History   Social History  . Marital status: Married    Spouse name: N/A  . Number of children: 5  . Years of education: N/A   Occupational History  . CNA    Social History Main Topics  . Smoking status: Current Every Day Smoker    Packs/day: 1.00    Years: 37.00    Types: Cigarettes  . Smokeless tobacco: Never Used  . Alcohol use Yes  . Drug use: No  . Sexual activity: Not Asked   Other Topics Concern  . None   Social History Narrative  . None   Additional Social History:    Allergies:   Allergies  Allergen Reactions  . Motrin [Ibuprofen] Other (See Comments)    Reaction: ulcers    Labs:  Results for orders placed or performed during the hospital encounter of 12/29/16 (from the past 48 hour(s))  Urinalysis, Routine w reflex microscopic     Status: Abnormal   Collection Time: 12/28/16  6:55 PM  Result Value Ref Range   Color,  Urine YELLOW YELLOW   APPearance CLEAR CLEAR   Specific Gravity, Urine 1.016 1.005 - 1.030   pH 5.0 5.0 - 8.0   Glucose, UA NEGATIVE NEGATIVE mg/dL   Hgb urine dipstick SMALL (A) NEGATIVE   Bilirubin Urine NEGATIVE NEGATIVE   Ketones, ur NEGATIVE NEGATIVE mg/dL   Protein, ur NEGATIVE NEGATIVE mg/dL   Nitrite NEGATIVE NEGATIVE   Leukocytes, UA NEGATIVE NEGATIVE   RBC / HPF NONE SEEN 0 - 5 RBC/hpf   WBC, UA 0-5 0 - 5 WBC/hpf   Bacteria, UA RARE (A) NONE SEEN   Squamous Epithelial / LPF 0-5 (A) NONE SEEN   Mucous PRESENT   Lipase, blood     Status: None   Collection Time: 12/28/16  7:05 PM  Result Value Ref Range   Lipase 25 11 - 51 U/L  Comprehensive metabolic panel     Status: Abnormal   Collection Time: 12/28/16  7:05 PM  Result  Value Ref Range   Sodium 138 135 - 145 mmol/L   Potassium 3.7 3.5 - 5.1 mmol/L   Chloride 106 101 - 111 mmol/L   CO2 21 (L) 22 - 32 mmol/L   Glucose, Bld 105 (H) 65 - 99 mg/dL   BUN 8 6 - 20 mg/dL   Creatinine, Ser 1.01 0.61 - 1.24 mg/dL   Calcium 9.3 8.9 - 10.3 mg/dL   Total Protein 6.7 6.5 - 8.1 g/dL   Albumin 4.0 3.5 - 5.0 g/dL   AST 19 15 - 41 U/L   ALT 16 (L) 17 - 63 U/L   Alkaline Phosphatase 54 38 - 126 U/L   Total Bilirubin 0.5 0.3 - 1.2 mg/dL   GFR calc non Af Amer >60 >60 mL/min   GFR calc Af Amer >60 >60 mL/min    Comment: (NOTE) The eGFR has been calculated using the CKD EPI equation. This calculation has not been validated in all clinical situations. eGFR's persistently <60 mL/min signify possible Chronic Kidney Disease.    Anion gap 11 5 - 15  CBC     Status: None   Collection Time: 12/28/16  7:05 PM  Result Value Ref Range   WBC 9.8 4.0 - 10.5 K/uL   RBC 4.55 4.22 - 5.81 MIL/uL   Hemoglobin 14.0 13.0 - 17.0 g/dL   HCT 43.3 39.0 - 52.0 %   MCV 95.2 78.0 - 100.0 fL   MCH 30.8 26.0 - 34.0 pg   MCHC 32.3 30.0 - 36.0 g/dL   RDW 13.5 11.5 - 15.5 %   Platelets 384 150 - 400 K/uL  Ethanol     Status: Abnormal   Collection  Time: 12/28/16  7:07 PM  Result Value Ref Range   Alcohol, Ethyl (B) 82 (H) <5 mg/dL    Comment:        LOWEST DETECTABLE LIMIT FOR SERUM ALCOHOL IS 5 mg/dL FOR MEDICAL PURPOSES ONLY   Salicylate level     Status: None   Collection Time: 12/28/16  7:07 PM  Result Value Ref Range   Salicylate Lvl <8.8 2.8 - 30.0 mg/dL  Acetaminophen level     Status: Abnormal   Collection Time: 12/28/16  7:07 PM  Result Value Ref Range   Acetaminophen (Tylenol), Serum <10 (L) 10 - 30 ug/mL    Comment:        THERAPEUTIC CONCENTRATIONS VARY SIGNIFICANTLY. A RANGE OF 10-30 ug/mL MAY BE AN EFFECTIVE CONCENTRATION FOR MANY PATIENTS. HOWEVER, SOME ARE BEST TREATED AT CONCENTRATIONS OUTSIDE THIS RANGE. ACETAMINOPHEN CONCENTRATIONS >150 ug/mL AT 4 HOURS AFTER INGESTION AND >50 ug/mL AT 12 HOURS AFTER INGESTION ARE OFTEN ASSOCIATED WITH TOXIC REACTIONS.   Rapid urine drug screen (hospital performed)     Status: None   Collection Time: 12/29/16  3:50 AM  Result Value Ref Range   Opiates NONE DETECTED NONE DETECTED   Cocaine NONE DETECTED NONE DETECTED   Benzodiazepines NONE DETECTED NONE DETECTED   Amphetamines NONE DETECTED NONE DETECTED   Tetrahydrocannabinol NONE DETECTED NONE DETECTED   Barbiturates NONE DETECTED NONE DETECTED    Comment:        DRUG SCREEN FOR MEDICAL PURPOSES ONLY.  IF CONFIRMATION IS NEEDED FOR ANY PURPOSE, NOTIFY LAB WITHIN 5 DAYS.        LOWEST DETECTABLE LIMITS FOR URINE DRUG SCREEN Drug Class       Cutoff (ng/mL) Amphetamine      1000 Barbiturate      200 Benzodiazepine   200  Tricyclics       161 Opiates          300 Cocaine          300 THC              50     Current Facility-Administered Medications  Medication Dose Route Frequency Provider Last Rate Last Dose  . cloNIDine (CATAPRES) tablet 0.1 mg  0.1 mg Oral QID Muthersbaugh, Hannah, PA-C   0.1 mg at 12/29/16 1038   Followed by  . [START ON 12/31/2016] cloNIDine (CATAPRES) tablet 0.1 mg  0.1 mg Oral  BID Muthersbaugh, Jarrett Soho, PA-C       Followed by  . [START ON 01/02/2017] cloNIDine (CATAPRES) tablet 0.1 mg  0.1 mg Oral Daily Muthersbaugh, Hannah, PA-C      . dicyclomine (BENTYL) tablet 20 mg  20 mg Oral Q6H PRN Muthersbaugh, Hannah, PA-C      . hydrOXYzine (ATARAX/VISTARIL) tablet 25 mg  25 mg Oral Q6H PRN Muthersbaugh, Hannah, PA-C      . loperamide (IMODIUM) capsule 2-4 mg  2-4 mg Oral PRN Muthersbaugh, Jarrett Soho, PA-C      . LORazepam (ATIVAN) injection 0-4 mg  0-4 mg Intravenous Q6H Muthersbaugh, Hannah, PA-C       Or  . LORazepam (ATIVAN) tablet 0-4 mg  0-4 mg Oral Q6H Muthersbaugh, Hannah, PA-C   1 mg at 12/29/16 0431  . [START ON 12/31/2016] LORazepam (ATIVAN) injection 0-4 mg  0-4 mg Intravenous Q12H Muthersbaugh, Hannah, PA-C       Or  . Derrill Memo ON 12/31/2016] LORazepam (ATIVAN) tablet 0-4 mg  0-4 mg Oral Q12H Muthersbaugh, Hannah, PA-C      . methocarbamol (ROBAXIN) tablet 500 mg  500 mg Oral Q8H PRN Muthersbaugh, Hannah, PA-C      . ondansetron (ZOFRAN-ODT) disintegrating tablet 4 mg  4 mg Oral Q6H PRN Muthersbaugh, Hannah, PA-C      . thiamine (VITAMIN B-1) tablet 100 mg  100 mg Oral Daily Muthersbaugh, Jarrett Soho, PA-C   100 mg at 12/29/16 1038   Or  . thiamine (B-1) injection 100 mg  100 mg Intravenous Daily Muthersbaugh, Jarrett Soho, PA-C       Current Outpatient Prescriptions  Medication Sig Dispense Refill  . acetaminophen (TYLENOL) 500 MG tablet Take 1,000 mg by mouth every 6 (six) hours as needed for mild pain.     . Albuterol Sulfate (PROAIR RESPICLICK) 096 (90 Base) MCG/ACT AEPB Inhale 2 puffs into the lungs daily as needed (shortness of breath).    Marland Kitchen amLODipine (NORVASC) 2.5 MG tablet Take 2.5 mg by mouth every morning.     Marland Kitchen aspirin 81 MG tablet Take 81 mg by mouth daily.    . budesonide-formoterol (SYMBICORT) 80-4.5 MCG/ACT inhaler Inhale 2 puffs into the lungs daily.    . colchicine-probenecid 0.5-500 MG per tablet Take 1 tablet by mouth 2 (two) times daily.    . insulin  glargine (LANTUS) 100 unit/mL SOPN Inject 30 Units into the skin at bedtime.    Marland Kitchen lisinopril (PRINIVIL,ZESTRIL) 10 MG tablet Take 10 mg by mouth every morning.     . metoprolol (TOPROL-XL) 200 MG 24 hr tablet Take 200 mg by mouth every morning.     . nitroGLYCERIN (NITROSTAT) 0.4 MG SL tablet Place 0.4 mg under the tongue every 5 (five) minutes as needed. For chest pain    . pantoprazole (PROTONIX) 40 MG tablet Take 40 mg by mouth every morning.     . simvastatin (ZOCOR) 40 MG  tablet Take 40 mg by mouth every morning.     . sitaGLIPtin (JANUVIA) 100 MG tablet Take 100 mg by mouth every morning.     . warfarin (COUMADIN) 5 MG tablet Take 7.5 mg by mouth every morning.       Musculoskeletal: UTA via camera.  Psychiatric Specialty Exam: Physical Exam  Nursing note and vitals reviewed.   Review of Systems  Psychiatric/Behavioral: Positive for hallucinations and substance abuse. Negative for depression, memory loss and suicidal ideas. The patient is not nervous/anxious and does not have insomnia.   All other systems reviewed and are negative.   Blood pressure 140/90, pulse 79, temperature 99.1 F (37.3 C), temperature source Oral, resp. rate 16, weight 134.3 kg (296 lb), SpO2 96 %.Body mass index is 45.01 kg/m.  General Appearance: in bed wearing hospital scrub  Eye Contact:  Good  Speech:  Clear and Coherent and Normal Rate  Volume:  Normal  Mood:  Euthymic  Affect:  Appropriate and Congruent  Thought Process:  Coherent and Goal Directed  Orientation:  Full (Time, Place, and Person)  Thought Content:  WDL and Logical  Suicidal Thoughts:  No  Homicidal Thoughts:  No  Memory:  Immediate;   Good Recent;   Good Remote;   Fair  Judgement:  Intact  Insight:  Good and Present  Psychomotor Activity:  Normal  Concentration:  Concentration: Good and Attention Span: Good  Recall:  Good  Fund of Knowledge:  Good  Language:  Good  Akathisia:  Negative  Handed:  Right  AIMS (if  indicated):     Assets:  Communication Skills Desire for Improvement Financial Resources/Insurance Housing Intimacy Leisure Time Resilience  ADL's:  Intact  Cognition:  WNL  Sleep:        Treatment Plan Summary: Patient denies any SI/HI  Patient requesting detox inpatient detox resources.  Disposition: No evidence of imminent risk to self or others at present.   Patient does not meet criteria for psychiatric inpatient admission. Supportive therapy provided about ongoing stressors. Discussed crisis plan, support from social network, calling 911, coming to the Emergency Department, and calling Suicide Hotline.  Discharge with resources for detox programs  Vicenta Aly, NP 12/29/2016 12:09 PM   Agree with NP assessment and plan

## 2016-12-29 NOTE — BH Assessment (Signed)
Tele Assessment Note   Brent Ortiz is an 56 y.o. male reporting to ED for Alcohol Detox.   He denies SI/HI and does not endorse AVH. He states he is in a lot of pain.  He denies having a therapist and states his is not under medication management for psychotropic medication.  Brent Ortiz states his is married and lives with his wife and children.  He states he can return home once he is discharge.  He did not indicate and significant stressors contributing to his current state.  He denies any legal issues or any upcoming court dates.  Brent Ortiz presented in a hospital gown with an unremarkable appearance.  His eye contact was fair and his language was coherent and logical. Brent Ortiz had freedom of movement.  Brent Ortiz appeared to be in a lot of pain and discomfort.  His mood was pleasant and appropriate for the circumstances .  His mood was irritable. His affect was congruent with his mood.  His thought process and thought content was logical. His judgement did not appear impaired and his insight was good.  He was 4 x's oriented to people, place, time, and situation.  Pt meets criteria for Inpatient Treatment per Patriciaann Clan, PA.    Diagnosis: Alcohol Withdrawal  Past Medical History:  Past Medical History:  Diagnosis Date  . Coronary artery disease   . Diabetes mellitus    niddm  . Gout   . HLD (hyperlipidemia)   . HTN (hypertension)   . Myocardial infarction ([place])    04-13-2011  . Osteoarthritis   . Poor dentition     Past Surgical History:  Procedure Laterality Date  . HERNIA REPAIR  2009  . I&D KNEE WITH POLY EXCHANGE  09/03/2011   Procedure: IRRIGATION AND DEBRIDEMENT KNEE WITH POLY EXCHANGE;  Surgeon: Mauri Pole, MD;  Location: [facility];  Service: Orthopedics;  Laterality: Left;  . TOTAL KNEE ARTHROPLASTY   right  feb 2012   left march 2012 r  . TOTAL KNEE REVISION  06/23/2011   Procedure: TOTAL KNEE REVISION;  Surgeon: Mauri Pole;  Location: [facility];  Service: Orthopedics;   Laterality: Left;  femomal nerve block done in holding area without incident    Family History:  Family History  Problem Relation Age of Onset  . Colon cancer Unknown     Social History:  reports that he has been smoking Cigarettes.  He has a 37.00 pack-year smoking history. He has never used smokeless tobacco. He reports that he drinks alcohol. He reports that he does not use drugs.  Additional Social History:  Alcohol / Drug Use Pain Medications: See MAR Prescriptions: See MAR Over the Counter: See MAR History of alcohol / drug use?: Yes Longest period of sobriety (when/how long): a couple of days about 8 or 9 day ago Negative Consequences of Use: Financial, Personal relationships Substance #1 Name of Substance 1: Alcohol 1 - Age of First Use: 14 1 - Amount (size/oz): 3/4 of a pint per day 1 - Frequency: daily 1 - Duration: 41 years 1 - Last Use / Amount: 12/29/16 Substance #2 Name of Substance 2: Pain Pills (Oxycodone) 2 - Age of First Use: 47 2 - Amount (size/oz): 9 years 2 - Frequency: daily 2 - Duration: 12/31/2016  CIWA: CIWA-Ar BP: (!) 170/105 Pulse Rate: 81 COWS:    PATIENT STRENGTHS: (choose at least two) Average or above average intelligence Communication skills Religious Affiliation  Allergies:  Allergies  Allergen Reactions  . Motrin [  Ibuprofen] Other (See Comments)    Reaction: ulcers    Home Medications:  (Not in a hospital admission)  OB/GYN Status:  No LMP for male patient.  General Assessment Data Location of Assessment: [facility] ED TTS Assessment: In system Is this a Tele or Face-to-Face Assessment?: Tele Assessment Is this an Initial Assessment or a Re-assessment for this encounter?: Initial Assessment Marital status: Married Living Arrangements: Spouse/significant other, Children Can pt return to current living arrangement?: Yes Admission Status: Voluntary Is patient capable of signing voluntary admission?: Yes Referral Source: Other  (Self) Insurance type: Medicaid  Medical Screening Exam ([place]) Medical Exam completed: Yes  Crisis Care Plan Living Arrangements: Spouse/significant other, Children Legal Guardian: Other: (self) Name of Psychiatrist: Non reported Name of Therapist: Verlin Fester reported  Education Status Is patient currently in school?: No Current Grade: no Highest grade of school patient has completed: [employer]  Risk to self with the past 6 months Suicidal Ideation: No Has patient been a risk to self within the past 6 months prior to admission? : No Suicidal Intent: No Has patient had any suicidal intent within the past 6 months prior to admission? : No Is patient at risk for suicide?: Yes Suicidal Plan?: No Has patient had any suicidal plan within the past 6 months prior to admission? : No Access to Means: No What has been your use of drugs/alcohol within the last 12 months?: yes Previous Attempts/Gestures: No How many times?: 0 Other Self Harm Risks: 0 Triggers for Past Attempts: None known Intentional Self Injurious Behavior: None Family Suicide History: Yes Recent stressful life event(s): Other (Comment) (Pt sts he is bored at home) Persecutory voices/beliefs?: Yes Depression: Yes Depression Symptoms: Tearfulness, Insomnia, Isolating, Loss of interest in usual pleasures Substance abuse history and/or treatment for substance abuse?: Yes Suicide prevention information given to non-admitted patients: Yes  Risk to Others within the past 6 months Homicidal Ideation: No Does patient have any lifetime risk of violence toward others beyond the six months prior to admission? : No Thoughts of Harm to Others: No Current Homicidal Intent: No Current Homicidal Plan: No Access to Homicidal Means: No Identified Victim: NA History of harm to others?: No Assessment of Violence: None Noted Violent Behavior Description: None indicated Does patient have access to weapons?: No Criminal Charges  Pending?: No Does patient have a court date: No Is patient on probation?: Unknown  Psychosis Hallucinations: Auditory Delusions: None noted  Mental Status Report Appearance/Hygiene: In hospital gown Eye Contact: Good Motor Activity: Echopraxia, Psychomotor retardation Speech: Logical/coherent Level of Consciousness: Alert Mood: Sad, Irritable Affect: Sullen, Sad Anxiety Level: None Orientation: Person, Situation, Time, Place Obsessive Compulsive Thoughts/Behaviors: None  Cognitive Functioning Concentration: Normal Memory: Recent Intact, Remote Intact IQ: Average Insight: Fair Impulse Control: Fair Appetite: Fair Weight Loss: 0 Weight Gain: 7 Sleep: Increased Vegetative Symptoms: None  ADLScreening [facility] Assessment Services) Patient's cognitive ability adequate to safely complete daily activities?: Yes Patient able to express need for assistance with ADLs?: Yes Independently performs ADLs?: Yes (appropriate for developmental age)  Prior Inpatient Therapy Prior Inpatient Therapy: No Prior Therapy Dates: None Prior Therapy Facilty/Provider(s): None  Prior Outpatient Therapy Prior Outpatient Therapy: Yes Prior Therapy Facilty/Provider(s): None reported Reason for Treatment: na Does patient have an ACCT team?: No Does patient have Intensive In-House Services?  : No Does patient have [facility] services? : No Does patient have P4CC services?: No  ADL Screening (condition at time of admission) Patient's cognitive ability adequate to safely complete daily activities?: Yes  Patient able to express need for assistance with ADLs?: Yes Independently performs ADLs?: Yes (appropriate for developmental age)             Regulatory affairs officer (For Healthcare) Does Patient Have a Medical Advance Directive?: No Would patient like information on creating a medical advance directive?: No - Patient declined, Yes (ED - Information included in AVS)    Additional Information 1:1 In  Past 12 Months?: No CIRT Risk: No Elopement Risk: No Does patient have medical clearance?: Yes     Disposition:  Disposition Initial Assessment Completed for this Encounter: Yes Disposition of Patient: Inpatient treatment program (Per Patriciaann Clan, PA) Type of inpatient treatment program: Adult  [place] 12/29/2016 4:26 AM

## 2016-12-29 NOTE — Progress Notes (Signed)
Per Otila Kluver. O, NP patient is psych cleared and recommended for D/C.   Horris Latino, RN notified.   CSW faxed outpatient SA ad detox resources to [facility] [place] fax number [phone].     Radonna Ricker MSW, [place] Disposition [phone]

## 2016-12-29 NOTE — ED Notes (Signed)
[phone] Brent Ortiz, daughter

## 2016-12-29 NOTE — ED Notes (Signed)
Per TTS pt recommended inpt stay

## 2017-01-05 DIAGNOSIS — F1120 Opioid dependence, uncomplicated: Secondary | ICD-10-CM | POA: Diagnosis not present

## 2017-01-05 DIAGNOSIS — F1020 Alcohol dependence, uncomplicated: Secondary | ICD-10-CM | POA: Diagnosis not present

## 2017-01-05 DIAGNOSIS — F17200 Nicotine dependence, unspecified, uncomplicated: Secondary | ICD-10-CM | POA: Diagnosis not present

## 2017-01-21 DIAGNOSIS — F1120 Opioid dependence, uncomplicated: Secondary | ICD-10-CM | POA: Diagnosis not present

## 2017-01-21 DIAGNOSIS — E119 Type 2 diabetes mellitus without complications: Secondary | ICD-10-CM | POA: Diagnosis not present

## 2017-02-15 DIAGNOSIS — G4733 Obstructive sleep apnea (adult) (pediatric): Secondary | ICD-10-CM | POA: Diagnosis not present

## 2017-02-22 DIAGNOSIS — E782 Mixed hyperlipidemia: Secondary | ICD-10-CM | POA: Diagnosis not present

## 2017-02-22 DIAGNOSIS — E1142 Type 2 diabetes mellitus with diabetic polyneuropathy: Secondary | ICD-10-CM | POA: Diagnosis not present

## 2017-02-22 DIAGNOSIS — F1120 Opioid dependence, uncomplicated: Secondary | ICD-10-CM | POA: Diagnosis not present

## 2017-02-22 DIAGNOSIS — J449 Chronic obstructive pulmonary disease, unspecified: Secondary | ICD-10-CM | POA: Diagnosis not present

## 2017-02-22 DIAGNOSIS — I10 Essential (primary) hypertension: Secondary | ICD-10-CM | POA: Diagnosis not present

## 2017-03-18 DIAGNOSIS — G4733 Obstructive sleep apnea (adult) (pediatric): Secondary | ICD-10-CM | POA: Diagnosis not present

## 2017-03-31 DIAGNOSIS — M65842 Other synovitis and tenosynovitis, left hand: Secondary | ICD-10-CM | POA: Diagnosis not present

## 2017-03-31 DIAGNOSIS — M65312 Trigger thumb, left thumb: Secondary | ICD-10-CM | POA: Diagnosis not present

## 2017-04-17 DIAGNOSIS — G4733 Obstructive sleep apnea (adult) (pediatric): Secondary | ICD-10-CM | POA: Diagnosis not present

## 2017-05-18 DIAGNOSIS — G4733 Obstructive sleep apnea (adult) (pediatric): Secondary | ICD-10-CM | POA: Diagnosis not present

## 2017-05-24 DIAGNOSIS — M7022 Olecranon bursitis, left elbow: Secondary | ICD-10-CM | POA: Diagnosis not present

## 2017-06-17 DIAGNOSIS — G4733 Obstructive sleep apnea (adult) (pediatric): Secondary | ICD-10-CM | POA: Diagnosis not present

## 2017-06-18 DIAGNOSIS — E785 Hyperlipidemia, unspecified: Secondary | ICD-10-CM | POA: Diagnosis not present

## 2017-06-18 DIAGNOSIS — E782 Mixed hyperlipidemia: Secondary | ICD-10-CM

## 2017-06-18 DIAGNOSIS — Z720 Tobacco use: Secondary | ICD-10-CM | POA: Diagnosis not present

## 2017-06-18 DIAGNOSIS — I2510 Atherosclerotic heart disease of native coronary artery without angina pectoris: Secondary | ICD-10-CM | POA: Diagnosis not present

## 2017-06-18 DIAGNOSIS — I10 Essential (primary) hypertension: Secondary | ICD-10-CM | POA: Diagnosis not present

## 2017-06-18 HISTORY — DX: Mixed hyperlipidemia: E78.2

## 2017-06-24 DIAGNOSIS — E785 Hyperlipidemia, unspecified: Secondary | ICD-10-CM | POA: Diagnosis not present

## 2017-07-09 DIAGNOSIS — M7022 Olecranon bursitis, left elbow: Secondary | ICD-10-CM | POA: Diagnosis not present

## 2017-07-09 DIAGNOSIS — M65341 Trigger finger, right ring finger: Secondary | ICD-10-CM | POA: Diagnosis not present

## 2017-07-09 DIAGNOSIS — M13841 Other specified arthritis, right hand: Secondary | ICD-10-CM | POA: Diagnosis not present

## 2017-07-13 DIAGNOSIS — I2510 Atherosclerotic heart disease of native coronary artery without angina pectoris: Secondary | ICD-10-CM | POA: Diagnosis not present

## 2017-07-18 DIAGNOSIS — G4733 Obstructive sleep apnea (adult) (pediatric): Secondary | ICD-10-CM | POA: Diagnosis not present

## 2017-07-26 DIAGNOSIS — M65342 Trigger finger, left ring finger: Secondary | ICD-10-CM | POA: Diagnosis not present

## 2017-07-29 DIAGNOSIS — E1142 Type 2 diabetes mellitus with diabetic polyneuropathy: Secondary | ICD-10-CM | POA: Diagnosis not present

## 2017-07-29 DIAGNOSIS — E782 Mixed hyperlipidemia: Secondary | ICD-10-CM | POA: Diagnosis not present

## 2017-07-29 DIAGNOSIS — I10 Essential (primary) hypertension: Secondary | ICD-10-CM | POA: Diagnosis not present

## 2017-07-29 DIAGNOSIS — Z0000 Encounter for general adult medical examination without abnormal findings: Secondary | ICD-10-CM | POA: Diagnosis not present

## 2017-07-29 DIAGNOSIS — G4733 Obstructive sleep apnea (adult) (pediatric): Secondary | ICD-10-CM | POA: Diagnosis not present

## 2017-07-29 DIAGNOSIS — Z23 Encounter for immunization: Secondary | ICD-10-CM | POA: Diagnosis not present

## 2017-07-29 DIAGNOSIS — J449 Chronic obstructive pulmonary disease, unspecified: Secondary | ICD-10-CM | POA: Diagnosis not present

## 2017-07-29 DIAGNOSIS — Z6841 Body Mass Index (BMI) 40.0 and over, adult: Secondary | ICD-10-CM | POA: Diagnosis not present

## 2017-07-29 DIAGNOSIS — F1120 Opioid dependence, uncomplicated: Secondary | ICD-10-CM | POA: Diagnosis not present

## 2017-08-05 ENCOUNTER — Other Ambulatory Visit: Payer: Self-pay

## 2017-08-05 ENCOUNTER — Encounter (HOSPITAL_COMMUNITY): Payer: Self-pay | Admitting: *Deleted

## 2017-08-05 MED ORDER — DEXTROSE 5 % IV SOLN
3.0000 g | INTRAVENOUS | Status: AC
  Administered 2017-08-06: 3 g via INTRAVENOUS
  Filled 2017-08-05: qty 3

## 2017-08-05 NOTE — Progress Notes (Signed)
Anesthesia Chart Review: SAME DAY WORK-UP.  Patient is a 58 year old male scheduled for dental restoration on 08/06/17 by Dr. Diona Browner.  History includes smoking, CAD, MI '12, DM2, hyperlipidemia, hypertension, gout, osteoarthritis, left TKR (s/p revision 06/13/11, s/p I&D 09/03/11), obesity. Presented to ED for alcohol and opiate detox 12/29/16, but did not meet criteria for in-patient admission.  Cardiologist is with [facility] [place]. Last visist 06/18/17 with Talbert Cage. No changes made to his medical therapy.  Smoking cessation encouraged.  Diet and exercise discussed.  28-month follow-up plan.  Meds include albuterol, aspirin 81 mg, Lipitor, Symbicort, colchicine-probenecid, lisinopril, metformin, Toprol XL, nitro, Percocet, Protonix, Ranexa, Januvia.  EKG 12/23/16 [facility]): Tracing requested. NORMAL SINUS RHYTHM NORMAL ECG WHEN COMPARED WITH ECG OF 22-Dec-2016 16:23, NO SIGNIFICANT CHANGE WAS FOUND Confirmed by Dimas Alexandria [phone]) on 12/23/2016 6:10:04 PM  Cardiac cath 02/27/16 [facility], Dr. Gwenith Spitz. See Care Everywhere): Conclusions LMCA: 0% and Normal. LAD: 0% and Aneurysmal. LCx: Chronic occlusion. RCA: 0% and Aneurysmal. Cardiac Collaterals   - Moderatecollateral flow from the Mid LAD to the 1st Ob Marg.   - Moderatecollateral flow from the Mid LAD to the Dist RCA. collateral to RV     marginal branch. Diagnostic Procedure Summary One vessel CAD OM, proximal OM1 CTO LAD and RCA are ectatic but widely patent Normal LV function EF 55% Diagnostic Procedure Recommendations Medical therapy for CAD No PCI attempt of Circ because: 1) ambiguous proximal cap, 2) severe mid and distal diffuse disesae, 3) patient obesity impairs imaging CAD is stable, does not explain patient symptoms; evaluate for nonischemic etiology  CXR 12/24/16 ([place]): Impression: 1. Bibasilar atelectasis and/or scarring, similar to prior. 2. No other active  cardiopulmonary disease. 3. Aortic atherosclerosis.  If EKG tracing not received, then he will need one done on the day of surgery. He will also need updated labs. (He does have comparison labs in [place] from 12/2016.)  PAT RN still attempting to reach patient to complete phone interview and will update me if any new changes, otherwise patient will be evaluated by his anesthesia team on the day of surgery. He is up to date with cardiology follow-up.  Jamarri Hugh [facility]/Anesthesiology Phone [phone] 08/05/2017 11:28 AM

## 2017-08-05 NOTE — Progress Notes (Signed)
Spoke with pt for pre-op call. Pt has hx of CAD, has a cardiologist in [place]. Denies any recent chest pain or sob. Pt had an EKG done in June and have requested the tracing and have received it. Pt is a type 2 diabetic. Does not check his blood sugar at home. Last A1C was within the past month, but pt can't remember what it was. States it was done at Dr. Purcell Nails Perry's office. Will call and get that result.

## 2017-08-06 ENCOUNTER — Other Ambulatory Visit: Payer: Self-pay

## 2017-08-06 ENCOUNTER — Encounter (HOSPITAL_COMMUNITY): Payer: Self-pay | Admitting: *Deleted

## 2017-08-06 ENCOUNTER — Ambulatory Visit (HOSPITAL_COMMUNITY)
Admission: RE | Admit: 2017-08-06 | Discharge: 2017-08-06 | Disposition: A | Discharge status: Home / Self Care | Payer: Medicare HMO | Source: Ambulatory Visit | Attending: Oral Surgery | Admitting: Oral Surgery

## 2017-08-06 ENCOUNTER — Encounter (HOSPITAL_COMMUNITY)
Admission: RE | Disposition: A | Discharge status: Home / Self Care | Payer: Self-pay | Source: Ambulatory Visit | Attending: Oral Surgery

## 2017-08-06 ENCOUNTER — Ambulatory Visit (HOSPITAL_COMMUNITY): Payer: Medicare HMO | Admitting: Vascular Surgery

## 2017-08-06 DIAGNOSIS — R69 Illness, unspecified: Secondary | ICD-10-CM | POA: Diagnosis not present

## 2017-08-06 DIAGNOSIS — D72829 Elevated white blood cell count, unspecified: Secondary | ICD-10-CM | POA: Diagnosis not present

## 2017-08-06 DIAGNOSIS — J189 Pneumonia, unspecified organism: Secondary | ICD-10-CM | POA: Diagnosis not present

## 2017-08-06 DIAGNOSIS — K089 Disorder of teeth and supporting structures, unspecified: Secondary | ICD-10-CM | POA: Diagnosis not present

## 2017-08-06 DIAGNOSIS — K029 Dental caries, unspecified: Secondary | ICD-10-CM | POA: Diagnosis not present

## 2017-08-06 DIAGNOSIS — D473 Essential (hemorrhagic) thrombocythemia: Secondary | ICD-10-CM | POA: Diagnosis not present

## 2017-08-06 HISTORY — PX: TOOTH EXTRACTION: SHX859

## 2017-08-06 HISTORY — DX: Pneumonia, unspecified organism: J18.9

## 2017-08-06 LAB — COMPREHENSIVE METABOLIC PANEL
ALK PHOS: 64 U/L (ref 38–126)
ALT: 15 U/L — AB (ref 17–63)
ANION GAP: 11 (ref 5–15)
AST: 17 U/L (ref 15–41)
Albumin: 3.6 g/dL (ref 3.5–5.0)
BUN: 18 mg/dL (ref 6–20)
CALCIUM: 8.7 mg/dL — AB (ref 8.9–10.3)
CO2: 20 mmol/L — ABNORMAL LOW (ref 22–32)
Chloride: 104 mmol/L (ref 101–111)
Creatinine, Ser: 1.02 mg/dL (ref 0.61–1.24)
GFR calc Af Amer: >60 mL/min (ref >60)
GFR calc non Af Amer: >60 mL/min (ref >60)
GLUCOSE: 124 mg/dL — AB (ref 65–99)
Potassium: 3.8 mmol/L (ref 3.5–5.1)
SODIUM: 135 mmol/L (ref 135–145)
Total Bilirubin: 0.6 mg/dL (ref 0.3–1.2)
Total Protein: 6.4 g/dL — ABNORMAL LOW (ref 6.5–8.1)

## 2017-08-06 LAB — GLUCOSE, CAPILLARY
GLUCOSE-CAPILLARY: 121 mg/dL — AB (ref 65–99)
Glucose-Capillary: 120 mg/dL — ABNORMAL HIGH (ref 65–99)
Glucose-Capillary: 157 mg/dL — ABNORMAL HIGH (ref 65–99)

## 2017-08-06 LAB — CBC
HEMATOCRIT: 38.7 % — AB (ref 39.0–52.0)
HEMOGLOBIN: 13.2 g/dL (ref 13.0–17.0)
MCH: 30.6 pg (ref 26.0–34.0)
MCHC: 34.1 g/dL (ref 30.0–36.0)
MCV: 89.8 fL (ref 78.0–100.0)
Platelets: 435 10*3/uL — ABNORMAL HIGH (ref 150–400)
RBC: 4.31 MIL/uL (ref 4.22–5.81)
RDW: 13.5 % (ref 11.5–15.5)
WBC: 10.6 10*3/uL — ABNORMAL HIGH (ref 4.0–10.5)

## 2017-08-06 SURGERY — DENTAL RESTORATION/EXTRACTIONS
Anesthesia: General | Site: Mouth | Laterality: Bilateral

## 2017-08-06 MED ORDER — ACETAMINOPHEN 325 MG PO TABS: 325.0000 mg | ORAL_TABLET | ORAL | Status: DC | PRN

## 2017-08-06 MED ORDER — OXYCODONE HCL 5 MG PO TABS: 5.0000 mg | ORAL_TABLET | Freq: Once | ORAL | Status: DC | PRN

## 2017-08-06 MED ORDER — PHENYLEPHRINE 40 MCG/ML (10ML) SYRINGE FOR IV PUSH (FOR BLOOD PRESSURE SUPPORT)
PREFILLED_SYRINGE | INTRAVENOUS | Status: DC | PRN
  Administered 2017-08-06 (×3): 80 ug via INTRAVENOUS
  Administered 2017-08-06: 40 ug via INTRAVENOUS

## 2017-08-06 MED ORDER — OXYCODONE HCL 5 MG PO TABS: 5.0000 mg | ORAL_TABLET | Freq: Four times a day (QID) | ORAL | 0 refills | Status: DC | PRN

## 2017-08-06 MED ORDER — ROCURONIUM BROMIDE 10 MG/ML (PF) SYRINGE
PREFILLED_SYRINGE | INTRAVENOUS | Status: AC
  Filled 2017-08-06: qty 5

## 2017-08-06 MED ORDER — ONDANSETRON HCL 4 MG/2ML IJ SOLN
INTRAMUSCULAR | Status: AC
  Filled 2017-08-06: qty 2

## 2017-08-06 MED ORDER — PROPOFOL 10 MG/ML IV BOLUS
INTRAVENOUS | Status: AC
  Filled 2017-08-06: qty 20

## 2017-08-06 MED ORDER — LACTATED RINGERS IV SOLN
INTRAVENOUS | Status: DC
  Administered 2017-08-06: 07:00:00 via INTRAVENOUS

## 2017-08-06 MED ORDER — MIDAZOLAM HCL 2 MG/2ML IJ SOLN
INTRAMUSCULAR | Status: AC
  Filled 2017-08-06: qty 2

## 2017-08-06 MED ORDER — 0.9 % SODIUM CHLORIDE (POUR BTL) OPTIME
TOPICAL | Status: DC | PRN
  Administered 2017-08-06: 1000 mL

## 2017-08-06 MED ORDER — SUGAMMADEX SODIUM 200 MG/2ML IV SOLN
INTRAVENOUS | Status: AC
  Filled 2017-08-06: qty 2

## 2017-08-06 MED ORDER — MIDAZOLAM HCL 5 MG/5ML IJ SOLN
INTRAMUSCULAR | Status: DC | PRN
  Administered 2017-08-06: 2 mg via INTRAVENOUS

## 2017-08-06 MED ORDER — PHENYLEPHRINE 40 MCG/ML (10ML) SYRINGE FOR IV PUSH (FOR BLOOD PRESSURE SUPPORT)
PREFILLED_SYRINGE | INTRAVENOUS | Status: AC
  Filled 2017-08-06: qty 10

## 2017-08-06 MED ORDER — LIDOCAINE-EPINEPHRINE 2 %-1:100000 IJ SOLN
INTRAMUSCULAR | Status: DC | PRN
  Administered 2017-08-06: 13 mL via INTRADERMAL

## 2017-08-06 MED ORDER — SUGAMMADEX SODIUM 200 MG/2ML IV SOLN
INTRAVENOUS | Status: DC | PRN
  Administered 2017-08-06: 200 mg via INTRAVENOUS

## 2017-08-06 MED ORDER — OXYMETAZOLINE HCL 0.05 % NA SOLN
NASAL | Status: DC | PRN
  Administered 2017-08-06 (×2): 1 via NASAL

## 2017-08-06 MED ORDER — OXYCODONE HCL 5 MG/5ML PO SOLN: 5.0000 mg | Freq: Once | ORAL | Status: DC | PRN

## 2017-08-06 MED ORDER — ONDANSETRON HCL 4 MG/2ML IJ SOLN: 4.0000 mg | Freq: Once | INTRAMUSCULAR | Status: DC | PRN

## 2017-08-06 MED ORDER — MEPERIDINE HCL 25 MG/ML IJ SOLN: 6.2500 mg | INTRAMUSCULAR | Status: DC | PRN

## 2017-08-06 MED ORDER — LIDOCAINE 2% (20 MG/ML) 5 ML SYRINGE
INTRAMUSCULAR | Status: AC
  Filled 2017-08-06: qty 5

## 2017-08-06 MED ORDER — DEXAMETHASONE SODIUM PHOSPHATE 10 MG/ML IJ SOLN
INTRAMUSCULAR | Status: DC | PRN
  Administered 2017-08-06: 10 mg via INTRAVENOUS

## 2017-08-06 MED ORDER — FENTANYL CITRATE (PF) 250 MCG/5ML IJ SOLN
INTRAMUSCULAR | Status: AC
Start: 2017-08-06 — End: ?
  Filled 2017-08-06: qty 5

## 2017-08-06 MED ORDER — DEXAMETHASONE SODIUM PHOSPHATE 10 MG/ML IJ SOLN
INTRAMUSCULAR | Status: AC
  Filled 2017-08-06: qty 1

## 2017-08-06 MED ORDER — FENTANYL CITRATE (PF) 100 MCG/2ML IJ SOLN
INTRAMUSCULAR | Status: DC | PRN
  Administered 2017-08-06: 150 ug via INTRAVENOUS

## 2017-08-06 MED ORDER — FENTANYL CITRATE (PF) 100 MCG/2ML IJ SOLN: 25.0000 ug | INTRAMUSCULAR | Status: DC | PRN

## 2017-08-06 MED ORDER — ACETAMINOPHEN 160 MG/5ML PO SOLN: 325.0000 mg | ORAL | Status: DC | PRN

## 2017-08-06 MED ORDER — ONDANSETRON HCL 4 MG/2ML IJ SOLN
INTRAMUSCULAR | Status: DC | PRN
  Administered 2017-08-06: 4 mg via INTRAVENOUS

## 2017-08-06 MED ORDER — SODIUM CHLORIDE 0.9 % IR SOLN
Status: DC | PRN
  Administered 2017-08-06: 1000 mL

## 2017-08-06 SURGICAL SUPPLY — 32 items
BLADE SURG 15 STRL LF DISP TIS (BLADE) IMPLANT
BLADE SURG 15 STRL SS (BLADE)
BUR CROSS CUT FISSURE 1.6 (BURR) ×2 IMPLANT
BUR CROSS CUT FISSURE 1.6MM (BURR) ×1
BUR EGG ELITE 4.0 (BURR) IMPLANT
BUR EGG ELITE 4.0MM (BURR)
CANISTER SUCT 3000ML PPV (MISCELLANEOUS) ×3 IMPLANT
COVER SURGICAL LIGHT HANDLE (MISCELLANEOUS) ×3 IMPLANT
GAUZE PACKING FOLDED 2  STR (GAUZE/BANDAGES/DRESSINGS) ×2
GAUZE PACKING FOLDED 2 STR (GAUZE/BANDAGES/DRESSINGS) ×1 IMPLANT
GLOVE BIO SURGEON STRL SZ 6.5 (GLOVE) IMPLANT
GLOVE BIO SURGEON STRL SZ7 (GLOVE) IMPLANT
GLOVE BIO SURGEON STRL SZ7.5 (GLOVE) ×3 IMPLANT
GLOVE BIO SURGEONS STRL SZ 6.5 (GLOVE)
GLOVE BIOGEL PI IND STRL 6.5 (GLOVE) IMPLANT
GLOVE BIOGEL PI IND STRL 7.0 (GLOVE) IMPLANT
GLOVE BIOGEL PI INDICATOR 6.5 (GLOVE)
GLOVE BIOGEL PI INDICATOR 7.0 (GLOVE)
GOWN STRL REUS W/ TWL LRG LVL3 (GOWN DISPOSABLE) ×1 IMPLANT
GOWN STRL REUS W/ TWL XL LVL3 (GOWN DISPOSABLE) ×1 IMPLANT
GOWN STRL REUS W/TWL LRG LVL3 (GOWN DISPOSABLE) ×3
GOWN STRL REUS W/TWL XL LVL3 (GOWN DISPOSABLE) ×3
KIT BASIN OR (CUSTOM PROCEDURE TRAY) ×3 IMPLANT
KIT ROOM TURNOVER OR (KITS) ×3 IMPLANT
NEEDLE 22X1 1/2 (OR ONLY) (NEEDLE) ×6 IMPLANT
NS IRRIG 1000ML POUR BTL (IV SOLUTION) ×3 IMPLANT
PAD ARMBOARD 7.5X6 YLW CONV (MISCELLANEOUS) ×3 IMPLANT
SPONGE SURGIFOAM ABS GEL 12-7 (HEMOSTASIS) IMPLANT
SUT CHROMIC 3 0 PS 2 (SUTURE) ×3 IMPLANT
TRAY ENT MC OR (CUSTOM PROCEDURE TRAY) ×3 IMPLANT
TUBING IRRIGATION (MISCELLANEOUS) ×3 IMPLANT
YANKAUER SUCT BULB TIP NO VENT (SUCTIONS) ×3 IMPLANT

## 2017-08-06 NOTE — Op Note (Signed)
08/06/2017  9:31 AM  PATIENT:  Ortencia Kick Correnti  57 y.o. male  PRE-OPERATIVE DIAGNOSIS:  NONRESTORABLE TEETH # 22, 23, 24, 25, 26, 27, 28, 31  POST-OPERATIVE DIAGNOSIS:  SAME  PROCEDURE:  Procedure(s): DENTAL EXTRACTIONS TEETH # 22, 23, 24, 25, 26, 27, 28, 31, alveoloplasty right and left mandible  SURGEON:  Surgeon(s): Diona Browner, DDS  ANESTHESIA:   local and general  EBL:  minimal  DRAINS: none   SPECIMEN:  No Specimen  COUNTS:  YES  PLAN OF CARE: Discharge to home after PACU  PATIENT DISPOSITION:  PACU - hemodynamically stable.   PROCEDURE DETAILS: Dictation # [id number]  Gae Bon, DMD 08/06/2017 9:31 AM

## 2017-08-06 NOTE — Anesthesia Postprocedure Evaluation (Signed)
Anesthesia Post Note  Patient: Brent Ortiz  Procedure(s) Performed: DENTAL RESTORATION/EXTRACTIONS (Bilateral Mouth)     Patient location during evaluation: PACU Anesthesia Type: General Level of consciousness: awake Pain management: pain level controlled Vital Signs Assessment: post-procedure vital signs reviewed and stable Respiratory status: spontaneous breathing Cardiovascular status: stable Postop Assessment: no apparent nausea or vomiting Anesthetic complications: no    Last Vitals:  Vitals:   08/06/17 1015 08/06/17 1022  BP: 122/77 128/78  Pulse: 89 89  Resp: 18 20  Temp:  (!) 36.4 C  SpO2: 92% 92%    Last Pain:  Vitals:   08/06/17 1022  TempSrc:   PainSc: 0-No pain   Pain Goal: Patients Stated Pain Goal: 2 (08/06/17 4314)               Naif Alabi JR,JOHN Mateo Flow

## 2017-08-06 NOTE — Anesthesia Procedure Notes (Signed)
Procedure Name: Intubation Date/Time: 08/06/2017 8:59 AM Performed by: Moshe Salisbury, CRNA Pre-anesthesia Checklist: Patient identified, Emergency Drugs available, Suction available and Patient being monitored Patient Re-evaluated:Patient Re-evaluated prior to induction Oxygen Delivery Method: Circle System Utilized Preoxygenation: Pre-oxygenation with 100% oxygen Induction Type: IV induction Ventilation: Mask ventilation without difficulty Laryngoscope Size: Glidescope and 4 Grade View: Grade I Nasal Tubes: Nasal Rae, Nasal prep performed and Left Tube size: 7.0 mm Number of attempts: 1 Intubation method: 14 fr red rubber catheter used to guide ETT from left nare into oropharynx. Placement Confirmation: ETT inserted through vocal cords under direct vision,  positive ETCO2 and breath sounds checked- equal and bilateral Secured at: 27 cm Tube secured with: Tape Dental Injury: Teeth and Oropharynx as per pre-operative assessment

## 2017-08-06 NOTE — H&P (Signed)
HISTORY AND PHYSICAL  Brent Ortiz is a 57 y.o. male patient with CC: general dentist referral for multiple dental extractions  No diagnosis found.  Past Medical History:  Diagnosis Date  . Coronary artery disease   . Diabetes mellitus    niddm  . Gout   . HLD (hyperlipidemia)   . HTN (hypertension)   . Myocardial infarction ([place])    04-13-2011  . Osteoarthritis   . Pneumonia   . Poor dentition     Current Facility-Administered Medications  Medication Dose Route Frequency Provider Last Rate Last Dose  . ceFAZolin (ANCEF) 3 g in dextrose 5 % 50 mL IVPB  3 g Intravenous To SS-Surg Diona Browner, DDS      . lactated ringers infusion   Intravenous Continuous Lyn Hollingshead, MD 10 mL/hr at 08/06/17 3532     Allergies  Allergen Reactions  . Motrin [Ibuprofen] Other (See Comments)    Reaction: ulcers   Active Problems:   * No active hospital problems. *  Vitals: Blood pressure 135/83, pulse 85, temperature 98.3 F (36.8 C), temperature source Oral, resp. rate 20, height 5\' 8"  (1.727 m), weight 293 lb (132.9 kg), SpO2 98 %. Lab results: Results for orders placed or performed during the hospital encounter of 08/06/17 (from the past 24 hour(s))  Glucose, capillary     Status: Abnormal   Collection Time: 08/06/17  6:30 AM  Result Value Ref Range   Glucose-Capillary 120 (H) 65 - 99 mg/dL   Radiology Results: No results found. General appearance: alert, cooperative and moderately obese Head: Normocephalic, without obvious abnormality, atraumatic Eyes: negative Nose: Nares normal. Septum midline. Mucosa normal. No drainage or sinus tenderness. Throat: lips, mucosa, and tongue normal; teeth and gums normal and multiple carious teeth. No trismus. Pharynx clear Neck: no adenopathy, supple, symmetrical, trachea midline and thyroid not enlarged, symmetric, no tenderness/mass/nodules Resp: clear to auscultation bilaterally Cardio: regular rate and rhythm, S1, S2 normal, no  murmur, click, rub or gallop  Assessment: Multiple nonrestorable teeth secondary to dental caries  Plan: Multiple dental extractions with alveoloplasty. GA. Day surgery.Nasal.   Diona Browner 08/06/2017

## 2017-08-06 NOTE — Transfer of Care (Signed)
Immediate Anesthesia Transfer of Care Note  Patient: Brent Ortiz  Procedure(s) Performed: DENTAL RESTORATION/EXTRACTIONS (Bilateral Mouth)  Patient Location: PACU  Anesthesia Type:General  Level of Consciousness: awake, oriented and patient cooperative  Airway & Oxygen Therapy: Patient Spontanous Breathing and Patient connected to nasal cannula oxygen  Post-op Assessment: Report given to RN, Post -op Vital signs reviewed and stable and Patient moving all extremities  Post vital signs: Reviewed and stable  Last Vitals:  Vitals:   08/06/17 0626 08/06/17 0940  BP: 135/83 (P) 117/74  Pulse: 85   Resp: 20   Temp: 36.8 C (P) 36.9 C  SpO2: 98%     Last Pain:  Vitals:   08/06/17 0626  TempSrc: Oral      Patients Stated Pain Goal: 2 (48/88/91 6945)  Complications: No apparent anesthesia complications

## 2017-08-06 NOTE — Anesthesia Preprocedure Evaluation (Signed)
Anesthesia Evaluation  Patient identified by MRN, date of birth, ID band Patient awake    Reviewed: Allergy & Precautions, NPO status , Patient's Chart, lab work & pertinent test results  Airway Mallampati: II       Dental  (+) Poor Dentition   Pulmonary Current Smoker,    Pulmonary exam normal breath sounds clear to auscultation       Cardiovascular hypertension, Normal cardiovascular exam Rhythm:Regular Rate:Normal     Neuro/Psych negative neurological ROS     GI/Hepatic GERD  Medicated,  Endo/Other  diabetes, Oral Hypoglycemic AgentsMorbid obesity  Renal/GU      Musculoskeletal   Abdominal (+) + obese,   Peds  Hematology   Anesthesia Other Findings   Reproductive/Obstetrics                             Anesthesia Physical Anesthesia Plan  ASA: III  Anesthesia Plan: General   Post-op Pain Management:    Induction: Intravenous  PONV Risk Score and Plan: 2 and Ondansetron, Midazolam, Treatment may vary due to age or medical condition and Dexamethasone  Airway Management Planned: Nasal ETT  Additional Equipment:   Intra-op Plan:   Post-operative Plan: Extubation in OR  Informed Consent: I have reviewed the patients History and Physical, chart, labs and discussed the procedure including the risks, benefits and alternatives for the proposed anesthesia with the patient or authorized representative who has indicated his/her understanding and acceptance.   Dental advisory given  Plan Discussed with: CRNA and Surgeon  Anesthesia Plan Comments:         Anesthesia Quick Evaluation

## 2017-08-07 ENCOUNTER — Encounter (HOSPITAL_COMMUNITY): Payer: Self-pay | Admitting: Oral Surgery

## 2017-08-09 NOTE — Op Note (Signed)
NAME:  Brent Ortiz, MCCAUGHEY                   ACCOUNT NO.:  MEDICAL RECORD NO.:  [id number]  LOCATION:                                 FACILITY:  PHYSICIAN:  Gae Bon, M.D.       DATE OF BIRTH:  DATE OF PROCEDURE:  08/06/2017 DATE OF DISCHARGE:                              OPERATIVE REPORT   PREOPERATIVE DIAGNOSIS:  Nonrestorable teeth numbers 22, 23, 24, 25, 26, 27, 28, 31.  POSTOPERATIVE DIAGNOSIS:  Nonrestorable teeth numbers 22, 23, 24, 25, 26, 27, 28, 31.  PROCEDURES:  Extraction of teeth numbers 22, 23, 24, 25, 26, 27, 28, 31; alveoplasty of right and left mandible.  SURGEON:  Gae Bon, MD.  ANESTHESIA:  General, nasal intubation.  DESCRIPTION OF PROCEDURE:  The patient was taken to the operating room and placed on the table in supine position.  General anesthesia was administered intravenously and a nasal endotracheal tube was placed and secured.  The patient was prepped and draped for surgery and time-out was performed.  The posterior pharynx was suctioned and a throat pack was placed.  A 2% lidocaine with 1:100,000 epinephrine was infiltrated in an inferior alveolar block on the right and left mandible and in the anterior buccal mucosa of the mandible.  A total of 13 mL was utilized. A bite block was placed in the right side of the mouth and a sweetheart retractor was used to retract the tongue.  A #15 blade was used to make an incision beginning approximately 1 cm proximal on the alveolar crest to tooth #22.  Then, the incision was carried in the gingival sulcus both buccally and lingually until tooth #28 was encountered.  The periosteum was reflected from around these teeth and then the teeth were elevated with a 301 elevator.  The teeth were then removed with a dental forceps.  The sockets were curetted.  The periosteum was reflected to expose the alveolar crest and then alveoplasty was performed using the egg-shaped bur and bone file.  The tissue was  then trimmed and then closed with 3-0 chromic.  The bite block and sweetheart retractor were then repositioned to the other side of the mouth and a 15 blade was used to make an incision around tooth #31 buccally and lingually in the gingival sulcus and then along the crest of the alveolar ridge until the previous incision was joined.  The periosteum was reflected and then the tooth was elevated with a 301 elevator and removed from the mouth with a dental forceps.  The alveoplasty was then performed using the egg-shaped bur and bone file on the right mandible and then the area was irrigated and closed with 3-0 chromic.  The oral cavity was then inspected and found to have good contour, hemostasis, and closure.  The areas were irrigated and suctioned and then the throat pack was removed.  The patient was left in the care of Anesthesia for extubation and transport to the recovery room with plans to discharge home through Day Surgery.  ESTIMATED BLOOD LOSS:  Minimum.  COMPLICATIONS:  None.  SPECIMENS:  None.     Gae Bon, M.D.  SMJ/MEDQ  D:  08/06/2017  T:  08/06/2017  Job:  [id number]

## 2017-08-25 DIAGNOSIS — E785 Hyperlipidemia, unspecified: Secondary | ICD-10-CM | POA: Diagnosis not present

## 2017-10-06 DIAGNOSIS — J440 Chronic obstructive pulmonary disease with acute lower respiratory infection: Secondary | ICD-10-CM | POA: Diagnosis not present

## 2017-10-06 DIAGNOSIS — E7800 Pure hypercholesterolemia, unspecified: Secondary | ICD-10-CM | POA: Diagnosis not present

## 2017-10-06 DIAGNOSIS — G8929 Other chronic pain: Secondary | ICD-10-CM | POA: Diagnosis not present

## 2017-10-06 DIAGNOSIS — R079 Chest pain, unspecified: Secondary | ICD-10-CM | POA: Diagnosis not present

## 2017-10-06 DIAGNOSIS — Z720 Tobacco use: Secondary | ICD-10-CM | POA: Diagnosis not present

## 2017-10-06 DIAGNOSIS — Z6841 Body Mass Index (BMI) 40.0 and over, adult: Secondary | ICD-10-CM | POA: Diagnosis not present

## 2017-10-06 DIAGNOSIS — J449 Chronic obstructive pulmonary disease, unspecified: Secondary | ICD-10-CM | POA: Diagnosis not present

## 2017-10-06 DIAGNOSIS — J9601 Acute respiratory failure with hypoxia: Secondary | ICD-10-CM | POA: Diagnosis not present

## 2017-10-06 DIAGNOSIS — I10 Essential (primary) hypertension: Secondary | ICD-10-CM | POA: Diagnosis not present

## 2017-10-06 DIAGNOSIS — R0602 Shortness of breath: Secondary | ICD-10-CM | POA: Diagnosis not present

## 2017-10-06 DIAGNOSIS — R0902 Hypoxemia: Secondary | ICD-10-CM | POA: Diagnosis not present

## 2017-10-06 DIAGNOSIS — J441 Chronic obstructive pulmonary disease with (acute) exacerbation: Secondary | ICD-10-CM | POA: Diagnosis not present

## 2017-10-06 DIAGNOSIS — I110 Hypertensive heart disease with heart failure: Secondary | ICD-10-CM | POA: Diagnosis not present

## 2017-10-06 DIAGNOSIS — I2510 Atherosclerotic heart disease of native coronary artery without angina pectoris: Secondary | ICD-10-CM | POA: Diagnosis not present

## 2017-10-06 DIAGNOSIS — E119 Type 2 diabetes mellitus without complications: Secondary | ICD-10-CM | POA: Diagnosis not present

## 2017-10-06 DIAGNOSIS — J189 Pneumonia, unspecified organism: Secondary | ICD-10-CM | POA: Diagnosis not present

## 2017-10-06 DIAGNOSIS — K219 Gastro-esophageal reflux disease without esophagitis: Secondary | ICD-10-CM | POA: Diagnosis not present

## 2017-10-06 DIAGNOSIS — F17210 Nicotine dependence, cigarettes, uncomplicated: Secondary | ICD-10-CM | POA: Diagnosis not present

## 2017-10-06 DIAGNOSIS — R0789 Other chest pain: Secondary | ICD-10-CM | POA: Diagnosis not present

## 2017-10-06 DIAGNOSIS — I5031 Acute diastolic (congestive) heart failure: Secondary | ICD-10-CM | POA: Diagnosis not present

## 2017-10-07 DIAGNOSIS — J9601 Acute respiratory failure with hypoxia: Secondary | ICD-10-CM | POA: Diagnosis not present

## 2017-10-07 DIAGNOSIS — I10 Essential (primary) hypertension: Secondary | ICD-10-CM | POA: Diagnosis not present

## 2017-10-07 DIAGNOSIS — Z720 Tobacco use: Secondary | ICD-10-CM | POA: Diagnosis not present

## 2017-10-07 DIAGNOSIS — E119 Type 2 diabetes mellitus without complications: Secondary | ICD-10-CM | POA: Diagnosis not present

## 2017-10-07 DIAGNOSIS — R079 Chest pain, unspecified: Secondary | ICD-10-CM | POA: Diagnosis not present

## 2017-10-07 DIAGNOSIS — E7800 Pure hypercholesterolemia, unspecified: Secondary | ICD-10-CM | POA: Diagnosis not present

## 2017-10-07 DIAGNOSIS — J441 Chronic obstructive pulmonary disease with (acute) exacerbation: Secondary | ICD-10-CM | POA: Diagnosis not present

## 2017-10-07 DIAGNOSIS — G8929 Other chronic pain: Secondary | ICD-10-CM | POA: Diagnosis not present

## 2017-10-07 DIAGNOSIS — K219 Gastro-esophageal reflux disease without esophagitis: Secondary | ICD-10-CM | POA: Diagnosis not present

## 2017-10-07 DIAGNOSIS — J440 Chronic obstructive pulmonary disease with acute lower respiratory infection: Secondary | ICD-10-CM | POA: Diagnosis not present

## 2017-10-14 ENCOUNTER — Other Ambulatory Visit: Payer: Self-pay

## 2017-10-14 NOTE — Patient Outreach (Signed)
[facility]) Care Management  10/14/2017  Kwabena Strutz Mages 02-16-1961 [id number]   Referral Date: 10/14/17 Referral Source: [employer] Report Date of Admission: Unknown Diagnosis: Unknown Date of Discharge:  10/11/17 Facility: [place]: [facility]  Referral received. No outreach warranted at this time. Transition of Care  will be completed by primary care provider office who will refer to [facility] care management if needed.  Plan: RN CM will close case at this time   Jone Baseman, RN, MSN [facility] Care Management Coordinator Direct Line [phone] Toll Free: [phone]  Fax: [phone]

## 2017-10-19 DIAGNOSIS — E785 Hyperlipidemia, unspecified: Secondary | ICD-10-CM | POA: Diagnosis not present

## 2017-10-19 DIAGNOSIS — Z720 Tobacco use: Secondary | ICD-10-CM | POA: Diagnosis not present

## 2017-10-19 DIAGNOSIS — I2510 Atherosclerotic heart disease of native coronary artery without angina pectoris: Secondary | ICD-10-CM | POA: Diagnosis not present

## 2017-11-05 DIAGNOSIS — M65342 Trigger finger, left ring finger: Secondary | ICD-10-CM | POA: Diagnosis not present

## 2017-11-05 DIAGNOSIS — M65332 Trigger finger, left middle finger: Secondary | ICD-10-CM | POA: Diagnosis not present

## 2017-11-22 DIAGNOSIS — E785 Hyperlipidemia, unspecified: Secondary | ICD-10-CM | POA: Diagnosis not present

## 2018-01-19 DIAGNOSIS — M65332 Trigger finger, left middle finger: Secondary | ICD-10-CM | POA: Diagnosis not present

## 2018-02-15 DIAGNOSIS — G4733 Obstructive sleep apnea (adult) (pediatric): Secondary | ICD-10-CM | POA: Diagnosis not present

## 2018-03-03 DIAGNOSIS — M65332 Trigger finger, left middle finger: Secondary | ICD-10-CM | POA: Diagnosis not present

## 2018-03-18 DIAGNOSIS — Z720 Tobacco use: Secondary | ICD-10-CM | POA: Diagnosis not present

## 2018-03-18 DIAGNOSIS — E7849 Other hyperlipidemia: Secondary | ICD-10-CM | POA: Diagnosis not present

## 2018-03-18 DIAGNOSIS — I2510 Atherosclerotic heart disease of native coronary artery without angina pectoris: Secondary | ICD-10-CM | POA: Diagnosis not present

## 2018-03-18 DIAGNOSIS — I10 Essential (primary) hypertension: Secondary | ICD-10-CM | POA: Diagnosis not present

## 2018-04-06 DIAGNOSIS — G4733 Obstructive sleep apnea (adult) (pediatric): Secondary | ICD-10-CM | POA: Diagnosis not present

## 2018-05-11 DIAGNOSIS — M65332 Trigger finger, left middle finger: Secondary | ICD-10-CM | POA: Diagnosis not present

## 2018-05-13 DIAGNOSIS — E1142 Type 2 diabetes mellitus with diabetic polyneuropathy: Secondary | ICD-10-CM | POA: Diagnosis not present

## 2018-05-13 DIAGNOSIS — I10 Essential (primary) hypertension: Secondary | ICD-10-CM | POA: Diagnosis not present

## 2018-05-13 DIAGNOSIS — E782 Mixed hyperlipidemia: Secondary | ICD-10-CM | POA: Diagnosis not present

## 2018-05-13 DIAGNOSIS — R310 Gross hematuria: Secondary | ICD-10-CM | POA: Diagnosis not present

## 2018-05-13 DIAGNOSIS — Z23 Encounter for immunization: Secondary | ICD-10-CM | POA: Diagnosis not present

## 2018-05-13 DIAGNOSIS — J449 Chronic obstructive pulmonary disease, unspecified: Secondary | ICD-10-CM | POA: Diagnosis not present

## 2018-07-29 DIAGNOSIS — Z720 Tobacco use: Secondary | ICD-10-CM | POA: Diagnosis not present

## 2018-07-29 DIAGNOSIS — E785 Hyperlipidemia, unspecified: Secondary | ICD-10-CM | POA: Diagnosis not present

## 2018-07-29 DIAGNOSIS — I10 Essential (primary) hypertension: Secondary | ICD-10-CM | POA: Diagnosis not present

## 2018-07-29 DIAGNOSIS — I2510 Atherosclerotic heart disease of native coronary artery without angina pectoris: Secondary | ICD-10-CM | POA: Diagnosis not present

## 2018-08-11 DIAGNOSIS — G8929 Other chronic pain: Secondary | ICD-10-CM | POA: Diagnosis not present

## 2018-08-11 DIAGNOSIS — Z79899 Other long term (current) drug therapy: Secondary | ICD-10-CM | POA: Diagnosis not present

## 2018-08-11 DIAGNOSIS — M545 Low back pain: Secondary | ICD-10-CM | POA: Diagnosis not present

## 2018-08-19 DIAGNOSIS — E103292 Type 1 diabetes mellitus with mild nonproliferative diabetic retinopathy without macular edema, left eye: Secondary | ICD-10-CM | POA: Diagnosis not present

## 2018-08-19 DIAGNOSIS — H2513 Age-related nuclear cataract, bilateral: Secondary | ICD-10-CM | POA: Diagnosis not present

## 2018-08-19 DIAGNOSIS — H04123 Dry eye syndrome of bilateral lacrimal glands: Secondary | ICD-10-CM | POA: Diagnosis not present

## 2018-08-19 DIAGNOSIS — H40003 Preglaucoma, unspecified, bilateral: Secondary | ICD-10-CM | POA: Diagnosis not present

## 2018-08-23 DIAGNOSIS — R1013 Epigastric pain: Secondary | ICD-10-CM | POA: Diagnosis not present

## 2018-08-23 DIAGNOSIS — R1084 Generalized abdominal pain: Secondary | ICD-10-CM | POA: Diagnosis not present

## 2018-08-23 DIAGNOSIS — R1031 Right lower quadrant pain: Secondary | ICD-10-CM | POA: Diagnosis not present

## 2018-08-23 DIAGNOSIS — R109 Unspecified abdominal pain: Secondary | ICD-10-CM | POA: Diagnosis not present

## 2018-08-23 DIAGNOSIS — R1011 Right upper quadrant pain: Secondary | ICD-10-CM | POA: Diagnosis not present

## 2018-08-23 DIAGNOSIS — R4182 Altered mental status, unspecified: Secondary | ICD-10-CM | POA: Diagnosis not present

## 2018-08-23 DIAGNOSIS — I252 Old myocardial infarction: Secondary | ICD-10-CM | POA: Diagnosis not present

## 2018-08-23 DIAGNOSIS — R51 Headache: Secondary | ICD-10-CM | POA: Diagnosis not present

## 2018-08-23 DIAGNOSIS — R0789 Other chest pain: Secondary | ICD-10-CM | POA: Diagnosis not present

## 2018-08-23 DIAGNOSIS — G8929 Other chronic pain: Secondary | ICD-10-CM | POA: Diagnosis not present

## 2018-08-23 DIAGNOSIS — E119 Type 2 diabetes mellitus without complications: Secondary | ICD-10-CM | POA: Diagnosis not present

## 2018-08-23 DIAGNOSIS — I10 Essential (primary) hypertension: Secondary | ICD-10-CM | POA: Diagnosis not present

## 2018-08-23 DIAGNOSIS — F17210 Nicotine dependence, cigarettes, uncomplicated: Secondary | ICD-10-CM | POA: Diagnosis not present

## 2018-08-23 DIAGNOSIS — E785 Hyperlipidemia, unspecified: Secondary | ICD-10-CM | POA: Diagnosis not present

## 2018-08-23 DIAGNOSIS — K219 Gastro-esophageal reflux disease without esophagitis: Secondary | ICD-10-CM | POA: Diagnosis not present

## 2018-08-23 DIAGNOSIS — R079 Chest pain, unspecified: Secondary | ICD-10-CM | POA: Diagnosis not present

## 2018-08-24 DIAGNOSIS — R072 Precordial pain: Secondary | ICD-10-CM | POA: Diagnosis not present

## 2018-08-24 DIAGNOSIS — R12 Heartburn: Secondary | ICD-10-CM | POA: Diagnosis not present

## 2018-09-08 DIAGNOSIS — G8929 Other chronic pain: Secondary | ICD-10-CM | POA: Diagnosis not present

## 2018-09-08 DIAGNOSIS — M545 Low back pain: Secondary | ICD-10-CM | POA: Diagnosis not present

## 2018-09-08 DIAGNOSIS — Z79899 Other long term (current) drug therapy: Secondary | ICD-10-CM | POA: Diagnosis not present

## 2018-09-20 DIAGNOSIS — M10031 Idiopathic gout, right wrist: Secondary | ICD-10-CM | POA: Diagnosis not present

## 2018-09-20 DIAGNOSIS — Z6841 Body Mass Index (BMI) 40.0 and over, adult: Secondary | ICD-10-CM | POA: Diagnosis not present

## 2018-09-22 DIAGNOSIS — M65311 Trigger thumb, right thumb: Secondary | ICD-10-CM | POA: Diagnosis not present

## 2018-09-22 DIAGNOSIS — M65352 Trigger finger, left little finger: Secondary | ICD-10-CM | POA: Diagnosis not present

## 2018-09-22 DIAGNOSIS — M65342 Trigger finger, left ring finger: Secondary | ICD-10-CM | POA: Diagnosis not present

## 2018-09-22 DIAGNOSIS — M65341 Trigger finger, right ring finger: Secondary | ICD-10-CM | POA: Diagnosis not present

## 2018-09-22 DIAGNOSIS — M65332 Trigger finger, left middle finger: Secondary | ICD-10-CM | POA: Diagnosis not present

## 2018-09-22 DIAGNOSIS — M65312 Trigger thumb, left thumb: Secondary | ICD-10-CM | POA: Diagnosis not present

## 2018-10-10 DIAGNOSIS — Z79891 Long term (current) use of opiate analgesic: Secondary | ICD-10-CM | POA: Diagnosis not present

## 2018-10-10 DIAGNOSIS — R0602 Shortness of breath: Secondary | ICD-10-CM | POA: Diagnosis not present

## 2018-10-10 DIAGNOSIS — F1120 Opioid dependence, uncomplicated: Secondary | ICD-10-CM | POA: Diagnosis not present

## 2018-10-10 DIAGNOSIS — F17210 Nicotine dependence, cigarettes, uncomplicated: Secondary | ICD-10-CM | POA: Diagnosis not present

## 2018-10-10 DIAGNOSIS — G8929 Other chronic pain: Secondary | ICD-10-CM | POA: Diagnosis not present

## 2018-10-10 DIAGNOSIS — M545 Low back pain: Secondary | ICD-10-CM | POA: Diagnosis not present

## 2018-10-10 DIAGNOSIS — Z79899 Other long term (current) drug therapy: Secondary | ICD-10-CM | POA: Diagnosis not present

## 2018-10-12 DIAGNOSIS — G4733 Obstructive sleep apnea (adult) (pediatric): Secondary | ICD-10-CM | POA: Diagnosis not present

## 2018-11-09 DIAGNOSIS — Z79891 Long term (current) use of opiate analgesic: Secondary | ICD-10-CM | POA: Diagnosis not present

## 2018-11-09 DIAGNOSIS — G8929 Other chronic pain: Secondary | ICD-10-CM | POA: Diagnosis not present

## 2018-11-09 DIAGNOSIS — F1120 Opioid dependence, uncomplicated: Secondary | ICD-10-CM | POA: Diagnosis not present

## 2018-11-09 DIAGNOSIS — R0602 Shortness of breath: Secondary | ICD-10-CM | POA: Diagnosis not present

## 2018-11-09 DIAGNOSIS — F17210 Nicotine dependence, cigarettes, uncomplicated: Secondary | ICD-10-CM | POA: Diagnosis not present

## 2018-11-09 DIAGNOSIS — E119 Type 2 diabetes mellitus without complications: Secondary | ICD-10-CM | POA: Diagnosis not present

## 2018-11-09 DIAGNOSIS — Z79899 Other long term (current) drug therapy: Secondary | ICD-10-CM | POA: Diagnosis not present

## 2018-11-09 DIAGNOSIS — M545 Low back pain: Secondary | ICD-10-CM | POA: Diagnosis not present

## 2018-12-08 DIAGNOSIS — G8929 Other chronic pain: Secondary | ICD-10-CM | POA: Diagnosis not present

## 2018-12-08 DIAGNOSIS — M545 Low back pain: Secondary | ICD-10-CM | POA: Diagnosis not present

## 2018-12-08 DIAGNOSIS — Z79899 Other long term (current) drug therapy: Secondary | ICD-10-CM | POA: Diagnosis not present

## 2018-12-20 DIAGNOSIS — F4321 Adjustment disorder with depressed mood: Secondary | ICD-10-CM | POA: Diagnosis not present

## 2018-12-20 DIAGNOSIS — J449 Chronic obstructive pulmonary disease, unspecified: Secondary | ICD-10-CM | POA: Diagnosis not present

## 2018-12-20 DIAGNOSIS — G4733 Obstructive sleep apnea (adult) (pediatric): Secondary | ICD-10-CM | POA: Diagnosis not present

## 2018-12-20 DIAGNOSIS — E1142 Type 2 diabetes mellitus with diabetic polyneuropathy: Secondary | ICD-10-CM | POA: Diagnosis not present

## 2018-12-20 DIAGNOSIS — Z1159 Encounter for screening for other viral diseases: Secondary | ICD-10-CM | POA: Diagnosis not present

## 2018-12-20 DIAGNOSIS — E782 Mixed hyperlipidemia: Secondary | ICD-10-CM | POA: Diagnosis not present

## 2018-12-20 DIAGNOSIS — I10 Essential (primary) hypertension: Secondary | ICD-10-CM | POA: Diagnosis not present

## 2018-12-20 DIAGNOSIS — Z6841 Body Mass Index (BMI) 40.0 and over, adult: Secondary | ICD-10-CM | POA: Diagnosis not present

## 2018-12-20 DIAGNOSIS — D473 Essential (hemorrhagic) thrombocythemia: Secondary | ICD-10-CM | POA: Diagnosis not present

## 2019-01-04 DIAGNOSIS — Z20828 Contact with and (suspected) exposure to other viral communicable diseases: Secondary | ICD-10-CM | POA: Diagnosis not present

## 2019-01-05 DIAGNOSIS — Z79899 Other long term (current) drug therapy: Secondary | ICD-10-CM | POA: Diagnosis not present

## 2019-01-05 DIAGNOSIS — G8929 Other chronic pain: Secondary | ICD-10-CM | POA: Diagnosis not present

## 2019-01-05 DIAGNOSIS — M545 Low back pain: Secondary | ICD-10-CM | POA: Diagnosis not present

## 2019-01-24 DIAGNOSIS — R000 Tachycardia, unspecified: Secondary | ICD-10-CM | POA: Diagnosis not present

## 2019-01-24 DIAGNOSIS — R079 Chest pain, unspecified: Secondary | ICD-10-CM | POA: Diagnosis not present

## 2019-02-07 DIAGNOSIS — M545 Low back pain: Secondary | ICD-10-CM | POA: Diagnosis not present

## 2019-02-07 DIAGNOSIS — G8929 Other chronic pain: Secondary | ICD-10-CM | POA: Diagnosis not present

## 2019-02-07 DIAGNOSIS — Z79899 Other long term (current) drug therapy: Secondary | ICD-10-CM | POA: Diagnosis not present

## 2019-03-08 DIAGNOSIS — Z79899 Other long term (current) drug therapy: Secondary | ICD-10-CM | POA: Diagnosis not present

## 2019-03-08 DIAGNOSIS — G8929 Other chronic pain: Secondary | ICD-10-CM | POA: Diagnosis not present

## 2019-03-08 DIAGNOSIS — M545 Low back pain: Secondary | ICD-10-CM | POA: Diagnosis not present

## 2019-03-29 DIAGNOSIS — Z1159 Encounter for screening for other viral diseases: Secondary | ICD-10-CM | POA: Diagnosis not present

## 2019-03-29 DIAGNOSIS — F17218 Nicotine dependence, cigarettes, with other nicotine-induced disorders: Secondary | ICD-10-CM | POA: Diagnosis not present

## 2019-03-29 DIAGNOSIS — J449 Chronic obstructive pulmonary disease, unspecified: Secondary | ICD-10-CM | POA: Diagnosis not present

## 2019-03-29 DIAGNOSIS — E1142 Type 2 diabetes mellitus with diabetic polyneuropathy: Secondary | ICD-10-CM | POA: Diagnosis not present

## 2019-03-29 DIAGNOSIS — Z6841 Body Mass Index (BMI) 40.0 and over, adult: Secondary | ICD-10-CM | POA: Diagnosis not present

## 2019-03-29 DIAGNOSIS — Z23 Encounter for immunization: Secondary | ICD-10-CM | POA: Diagnosis not present

## 2019-03-29 DIAGNOSIS — I10 Essential (primary) hypertension: Secondary | ICD-10-CM | POA: Diagnosis not present

## 2019-03-29 DIAGNOSIS — E782 Mixed hyperlipidemia: Secondary | ICD-10-CM | POA: Diagnosis not present

## 2019-04-07 DIAGNOSIS — M545 Low back pain: Secondary | ICD-10-CM | POA: Diagnosis not present

## 2019-04-07 DIAGNOSIS — Z79899 Other long term (current) drug therapy: Secondary | ICD-10-CM | POA: Diagnosis not present

## 2019-04-07 DIAGNOSIS — G8929 Other chronic pain: Secondary | ICD-10-CM | POA: Diagnosis not present

## 2019-05-10 DIAGNOSIS — Z79899 Other long term (current) drug therapy: Secondary | ICD-10-CM | POA: Diagnosis not present

## 2019-05-10 DIAGNOSIS — G8929 Other chronic pain: Secondary | ICD-10-CM | POA: Diagnosis not present

## 2019-05-10 DIAGNOSIS — M545 Low back pain: Secondary | ICD-10-CM | POA: Diagnosis not present

## 2019-05-10 DIAGNOSIS — F17210 Nicotine dependence, cigarettes, uncomplicated: Secondary | ICD-10-CM | POA: Diagnosis not present

## 2019-05-10 DIAGNOSIS — G4700 Insomnia, unspecified: Secondary | ICD-10-CM | POA: Diagnosis not present

## 2019-05-10 DIAGNOSIS — Z7189 Other specified counseling: Secondary | ICD-10-CM | POA: Diagnosis not present

## 2019-06-08 DIAGNOSIS — Z79899 Other long term (current) drug therapy: Secondary | ICD-10-CM | POA: Diagnosis not present

## 2019-06-08 DIAGNOSIS — G8929 Other chronic pain: Secondary | ICD-10-CM | POA: Diagnosis not present

## 2019-06-08 DIAGNOSIS — M545 Low back pain: Secondary | ICD-10-CM | POA: Diagnosis not present

## 2019-07-10 DIAGNOSIS — G8929 Other chronic pain: Secondary | ICD-10-CM | POA: Diagnosis not present

## 2019-07-10 DIAGNOSIS — M25562 Pain in left knee: Secondary | ICD-10-CM | POA: Diagnosis not present

## 2019-07-10 DIAGNOSIS — M25561 Pain in right knee: Secondary | ICD-10-CM | POA: Diagnosis not present

## 2019-07-10 DIAGNOSIS — Z79899 Other long term (current) drug therapy: Secondary | ICD-10-CM | POA: Diagnosis not present

## 2019-07-12 DIAGNOSIS — F17218 Nicotine dependence, cigarettes, with other nicotine-induced disorders: Secondary | ICD-10-CM | POA: Diagnosis not present

## 2019-07-12 DIAGNOSIS — G4733 Obstructive sleep apnea (adult) (pediatric): Secondary | ICD-10-CM | POA: Diagnosis not present

## 2019-07-12 DIAGNOSIS — Z6841 Body Mass Index (BMI) 40.0 and over, adult: Secondary | ICD-10-CM | POA: Diagnosis not present

## 2019-07-12 DIAGNOSIS — Z1159 Encounter for screening for other viral diseases: Secondary | ICD-10-CM | POA: Diagnosis not present

## 2019-07-12 DIAGNOSIS — I10 Essential (primary) hypertension: Secondary | ICD-10-CM | POA: Diagnosis not present

## 2019-07-12 DIAGNOSIS — J449 Chronic obstructive pulmonary disease, unspecified: Secondary | ICD-10-CM | POA: Diagnosis not present

## 2019-07-12 DIAGNOSIS — F4321 Adjustment disorder with depressed mood: Secondary | ICD-10-CM | POA: Diagnosis not present

## 2019-07-12 DIAGNOSIS — E782 Mixed hyperlipidemia: Secondary | ICD-10-CM | POA: Diagnosis not present

## 2019-07-12 DIAGNOSIS — E1142 Type 2 diabetes mellitus with diabetic polyneuropathy: Secondary | ICD-10-CM | POA: Diagnosis not present

## 2019-08-09 DIAGNOSIS — G8929 Other chronic pain: Secondary | ICD-10-CM | POA: Diagnosis not present

## 2019-08-09 DIAGNOSIS — M545 Low back pain: Secondary | ICD-10-CM | POA: Diagnosis not present

## 2019-08-09 DIAGNOSIS — Z79899 Other long term (current) drug therapy: Secondary | ICD-10-CM | POA: Diagnosis not present

## 2019-08-09 DIAGNOSIS — M25562 Pain in left knee: Secondary | ICD-10-CM | POA: Diagnosis not present

## 2019-08-22 ENCOUNTER — Other Ambulatory Visit: Payer: Self-pay

## 2019-08-22 DIAGNOSIS — E1140 Type 2 diabetes mellitus with diabetic neuropathy, unspecified: Secondary | ICD-10-CM

## 2019-08-22 MED ORDER — GABAPENTIN 600 MG PO TABS: 600.0000 mg | ORAL_TABLET | Freq: Every morning | ORAL | 1 refills | Status: DC

## 2019-09-06 DIAGNOSIS — M25562 Pain in left knee: Secondary | ICD-10-CM | POA: Diagnosis not present

## 2019-09-06 DIAGNOSIS — Z79899 Other long term (current) drug therapy: Secondary | ICD-10-CM | POA: Diagnosis not present

## 2019-09-06 DIAGNOSIS — M545 Low back pain: Secondary | ICD-10-CM | POA: Diagnosis not present

## 2019-09-06 DIAGNOSIS — G8929 Other chronic pain: Secondary | ICD-10-CM | POA: Diagnosis not present

## 2019-10-03 ENCOUNTER — Telehealth (INDEPENDENT_AMBULATORY_CARE_PROVIDER_SITE_OTHER): Payer: Medicare HMO | Admitting: Legal Medicine

## 2019-10-03 ENCOUNTER — Encounter: Payer: Self-pay | Admitting: Legal Medicine

## 2019-10-03 VITALS — Temp 99.7°F | Ht 68.0 in | Wt 287.0 lb

## 2019-10-03 DIAGNOSIS — J441 Chronic obstructive pulmonary disease with (acute) exacerbation: Secondary | ICD-10-CM | POA: Insufficient documentation

## 2019-10-03 HISTORY — DX: Chronic obstructive pulmonary disease with (acute) exacerbation: J44.1

## 2019-10-03 MED ORDER — LEVOFLOXACIN 500 MG PO TABS: 500.0000 mg | ORAL_TABLET | Freq: Every day | ORAL | 0 refills | Status: DC

## 2019-10-03 MED ORDER — PREDNISONE 10 MG (21) PO TBPK: ORAL_TABLET | ORAL | 0 refills | Status: DC

## 2019-10-03 NOTE — Assessment & Plan Note (Signed)
Patient is coughing and SOB.  He is using his albuterol.  His CPAP is not working.  I told him to call for repairs to respiratory company.  He is having yellow phlegm.  I called in Levaquin ans prednisone pack, follow up 2 days if not improving.

## 2019-10-03 NOTE — Progress Notes (Signed)
Virtual Visit via Telephone Note   This visit type was conducted due to national recommendations for restrictions regarding the COVID-19 Pandemic (e.g. social distancing) in an effort to limit this patient's exposure and mitigate transmission in our community.  Due to his co-morbid illnesses, this patient is at least at moderate risk for complications without adequate follow up.  This format is felt to be most appropriate for this patient at this time.  The patient did not have access to video technology/had technical difficulties with video requiring transitioning to audio format only (telephone).  All issues noted in this document were discussed and addressed.  No physical exam could be performed with this format.  Patient verbally consented to a telehealth visit.   Date:  10/03/2019   ID:  Brent Ortiz, DOB 1960-07-20, MRN [id number]  Patient Location: Home Provider Location: Office  PCP:  [name], MD   Evaluation Performed:  New Patient Evaluation  Chief Complaint:  Copd with exacerbation  History of Present Illness:    Brent Ortiz is a 59 y.o. male with copd with exacerbation.  He has been sick for 2 weeks.  He is coughing up yellow phlegm and is dyspneic.  No edema but he has orthopnea, no chest pain.  He is reluctant to come in.  The patient does not have symptoms concerning for COVID-19 infection (fever, chills, cough, or new shortness of breath).    Past Medical History:  Diagnosis Date  . Coronary artery disease   . Diabetes mellitus    niddm  . Gout   . HLD (hyperlipidemia)   . HTN (hypertension)   . Myocardial infarction ([place])    04-13-2011  . Osteoarthritis   . Pneumonia   . Poor dentition    Past Surgical History:  Procedure Laterality Date  . HERNIA REPAIR  2009  . I & D KNEE WITH POLY EXCHANGE  09/03/2011   Procedure: IRRIGATION AND DEBRIDEMENT KNEE WITH POLY EXCHANGE;  Surgeon: Mauri Pole, MD;  Location: [facility];  Service:  Orthopedics;  Laterality: Left;  . TOOTH EXTRACTION Bilateral 08/06/2017   Procedure: DENTAL RESTORATION/EXTRACTIONS;  Surgeon: Diona Browner, DDS;  Location: [facility];  Service: Oral Surgery;  Laterality: Bilateral;  . TOTAL KNEE ARTHROPLASTY   right  feb 2012   left march 2012 r  . TOTAL KNEE REVISION  06/23/2011   Procedure: TOTAL KNEE REVISION;  Surgeon: Mauri Pole;  Location: [facility];  Service: Orthopedics;  Laterality: Left;  femomal nerve block done in holding area without incident    Family History  Problem Relation Age of Onset  . Colon cancer Unknown   . Colon cancer Mother     Current Meds  Medication Sig  . acetaminophen (TYLENOL) 500 MG tablet Take 500 mg by mouth every 6 (six) hours as needed for mild pain.   . Albuterol Sulfate (PROAIR RESPICLICK) 123XX123 (90 Base) MCG/ACT AEPB Inhale 2 puffs into the lungs every 4 (four) hours as needed (shortness of breath).   Marland Kitchen aspirin 81 MG tablet Take 81 mg by mouth daily.  Marland Kitchen atorvastatin (LIPITOR) 40 MG tablet Take 40 mg by mouth daily.  . budesonide-formoterol (SYMBICORT) 80-4.5 MCG/ACT inhaler Inhale 2 puffs into the lungs 2 (two) times daily.   . colchicine-probenecid 0.5-500 MG per tablet Take 1 tablet by mouth 2 (two) times daily as needed (GOUT).   Marland Kitchen diclofenac Sodium (VOLTAREN) 1 % GEL Place onto the skin.  Marland Kitchen diflorasone (PSORCON) 0.05 % ointment   .  ezetimibe (ZETIA) 10 MG tablet Take by mouth.  . gabapentin (NEURONTIN) 600 MG tablet Take 1 tablet (600 mg total) by mouth every morning.  . insulin glargine (LANTUS) 100 UNIT/ML injection Inject into the skin.  Marland Kitchen levofloxacin (LEVAQUIN) 500 MG tablet Take 1 tablet (500 mg total) by mouth daily.  Marland Kitchen lidocaine-prilocaine (EMLA) cream   . lisinopril (PRINIVIL,ZESTRIL) 10 MG tablet Take 10 mg by mouth every morning.   . meloxicam (MOBIC) 15 MG tablet Take 15 mg by mouth daily.   . metFORMIN (GLUCOPHAGE) 500 MG tablet Take 500 mg by mouth daily with supper.   . methocarbamol (ROBAXIN)  750 MG tablet   . nitroGLYCERIN (NITROSTAT) 0.4 MG SL tablet Place 0.4 mg under the tongue every 5 (five) minutes as needed. For chest pain  . oxyCODONE (OXY IR/ROXICODONE) 5 MG immediate release tablet Take 1 tablet (5 mg total) by mouth every 6 (six) hours as needed.  Marland Kitchen oxyCODONE-acetaminophen (PERCOCET) 10-325 MG tablet TAKE 1 TABLET BY MOUTH  EVERY 6 HOURS AS NEEDED FOR PAIN  . pantoprazole (PROTONIX) 40 MG tablet Take 40 mg by mouth daily.   . ranolazine (RANEXA) 500 MG 12 hr tablet Take 500 mg by mouth 2 (two) times daily.  . rosuvastatin (CRESTOR) 20 MG tablet   . sertraline (ZOLOFT) 25 MG tablet   . tamsulosin (FLOMAX) 0.4 MG CAPS capsule Take by mouth.  . traZODone (DESYREL) 150 MG tablet Take 150 mg by mouth at bedtime.  . [DISCONTINUED] levofloxacin (LEVAQUIN) 500 MG tablet Take 500 mg by mouth daily.     Allergies:   Motrin [ibuprofen] and Other   Social History   Tobacco Use  . Smoking status: Current Every Day Smoker    Packs/day: 1.50    Years: 37.00    Pack years: 55.50    Types: Cigarettes  . Smokeless tobacco: Never Used  Substance Use Topics  . Alcohol use: No    Comment: heavy drinker in past   . Drug use: No     Family Hx: The patient's family history includes Colon cancer in his mother and unknown relative.  ROS:   Please see the history of present illness.    Review of Systems  Constitutional: Positive for malaise/fatigue.  HENT: Negative.   Eyes: Negative.   Respiratory: Positive for cough, sputum production and wheezing.   Cardiovascular: Negative.   Gastrointestinal: Negative.   Genitourinary: Negative.   Musculoskeletal: Negative.   Skin: Negative.     All other systems reviewed and are negative.  Labs/Other Tests and Data Reviewed:    Recent Labs: No results found for requested labs within last 8760 hours.   Recent Lipid Panel No results found for: CHOL, TRIG, HDL, CHOLHDL, LDLCALC, LDLDIRECT  Wt Readings from Last 3 Encounters:    10/03/19 287 lb (130.2 kg)  08/06/17 293 lb (132.9 kg)  12/28/16 296 lb (134.3 kg)     Objective:    Vital Signs:  Temp 99.7 F (37.6 C)   Ht 5\' 8"  (1.727 m)   Wt 287 lb (130.2 kg)   BMI 43.64 kg/m    VITAL SIGNS:  reviewed  ASSESSMENT & PLAN:    Problem List Items Addressed This Visit      Respiratory   Obstructive chronic bronchitis with exacerbation ([place]) - Primary    Patient is coughing and SOB.  He is using his albuterol.  His CPAP is not working.  I told him to call for repairs to respiratory company.  He  is having yellow phlegm.  I called in Levaquin ans prednisone pack, follow up 2 days if not improving.      Relevant Medications   levofloxacin (LEVAQUIN) 500 MG tablet   predniSONE (STERAPRED UNI-PAK 21 TAB) 10 MG (21) TBPK tablet      Obstructive chronic bronchitis with exacerbation ([facility]) Patient is coughing and SOB.  He is using his albuterol.  His CPAP is not working.  I told him to call for repairs to respiratory company.  He is having yellow phlegm.  I called in Levaquin ans prednisone pack, follow up 2 days if not improving.  Return in about 2 days (around 10/05/2019), or if not improving. Meds ordered this encounter  Medications  . levofloxacin (LEVAQUIN) 500 MG tablet    Sig: Take 1 tablet (500 mg total) by mouth daily.    Dispense:  7 tablet    Refill:  0  . predniSONE (STERAPRED UNI-PAK 21 TAB) 10 MG (21) TBPK tablet    Sig: Prednisone 6 pills first day, then 5 pills second day, then taper by one a day until gone    Dispense:  21 tablet    Refill:  0    COVID-19 Education: The signs and symptoms of COVID-19 were discussed with the patient and how to seek care for testing (follow up with PCP or arrange E-visit). The importance of social distancing was discussed today.  Time:   Today, I have spent 20 minutes with the patient with telehealth technology discussing the above problems.     Medication Adjustments/Labs and Tests Ordered: Current  medicines are reviewed at length with the patient today.  Concerns regarding medicines are outlined above.   Tests Ordered: No orders of the defined types were placed in this encounter.   Medication Changes: Meds ordered this encounter  Medications  . levofloxacin (LEVAQUIN) 500 MG tablet    Sig: Take 1 tablet (500 mg total) by mouth daily.    Dispense:  7 tablet    Refill:  0  . predniSONE (STERAPRED UNI-PAK 21 TAB) 10 MG (21) TBPK tablet    Sig: Prednisone 6 pills first day, then 5 pills second day, then taper by one a day until gone    Dispense:  21 tablet    Refill:  0    Follow Up:  In Person prn  Signed, Reinaldo Meeker, MD  10/03/2019 2:07 PM    [place]

## 2019-10-09 DIAGNOSIS — Z79899 Other long term (current) drug therapy: Secondary | ICD-10-CM | POA: Diagnosis not present

## 2019-10-09 DIAGNOSIS — G8929 Other chronic pain: Secondary | ICD-10-CM | POA: Diagnosis not present

## 2019-10-09 DIAGNOSIS — M545 Low back pain: Secondary | ICD-10-CM | POA: Diagnosis not present

## 2019-10-09 DIAGNOSIS — M25562 Pain in left knee: Secondary | ICD-10-CM | POA: Diagnosis not present

## 2019-10-17 ENCOUNTER — Other Ambulatory Visit: Payer: Self-pay

## 2019-10-17 MED ORDER — TRAZODONE HCL 150 MG PO TABS: 150.0000 mg | ORAL_TABLET | Freq: Every evening | ORAL | 2 refills | Status: DC

## 2019-10-19 ENCOUNTER — Other Ambulatory Visit: Payer: Self-pay

## 2019-10-19 ENCOUNTER — Ambulatory Visit (INDEPENDENT_AMBULATORY_CARE_PROVIDER_SITE_OTHER): Payer: Medicare HMO | Admitting: Legal Medicine

## 2019-10-19 ENCOUNTER — Encounter: Payer: Self-pay | Admitting: Legal Medicine

## 2019-10-19 VITALS — BP 150/90 | HR 103 | Temp 96.0°F | Resp 20 | Ht 68.0 in | Wt 285.8 lb

## 2019-10-19 DIAGNOSIS — F321 Major depressive disorder, single episode, moderate: Secondary | ICD-10-CM

## 2019-10-19 DIAGNOSIS — G8929 Other chronic pain: Secondary | ICD-10-CM | POA: Insufficient documentation

## 2019-10-19 DIAGNOSIS — I10 Essential (primary) hypertension: Secondary | ICD-10-CM | POA: Diagnosis not present

## 2019-10-19 DIAGNOSIS — I25118 Atherosclerotic heart disease of native coronary artery with other forms of angina pectoris: Secondary | ICD-10-CM | POA: Diagnosis not present

## 2019-10-19 DIAGNOSIS — G894 Chronic pain syndrome: Secondary | ICD-10-CM

## 2019-10-19 DIAGNOSIS — N401 Enlarged prostate with lower urinary tract symptoms: Secondary | ICD-10-CM

## 2019-10-19 DIAGNOSIS — J441 Chronic obstructive pulmonary disease with (acute) exacerbation: Secondary | ICD-10-CM | POA: Diagnosis not present

## 2019-10-19 DIAGNOSIS — Z794 Long term (current) use of insulin: Secondary | ICD-10-CM | POA: Diagnosis not present

## 2019-10-19 DIAGNOSIS — E1140 Type 2 diabetes mellitus with diabetic neuropathy, unspecified: Secondary | ICD-10-CM | POA: Diagnosis not present

## 2019-10-19 DIAGNOSIS — K2100 Gastro-esophageal reflux disease with esophagitis, without bleeding: Secondary | ICD-10-CM

## 2019-10-19 DIAGNOSIS — E782 Mixed hyperlipidemia: Secondary | ICD-10-CM | POA: Diagnosis not present

## 2019-10-19 DIAGNOSIS — R3911 Hesitancy of micturition: Secondary | ICD-10-CM

## 2019-10-19 HISTORY — DX: Other chronic pain: G89.29

## 2019-10-19 HISTORY — DX: Chronic pain syndrome: G89.4

## 2019-10-19 MED ORDER — LEVOFLOXACIN 500 MG PO TABS: 500.0000 mg | ORAL_TABLET | Freq: Every day | ORAL | 0 refills | Status: DC

## 2019-10-19 MED ORDER — NITROGLYCERIN 0.4 MG SL SUBL: 0.4000 mg | SUBLINGUAL_TABLET | SUBLINGUAL | 3 refills | Status: DC | PRN

## 2019-10-19 MED ORDER — PANTOPRAZOLE SODIUM 40 MG PO TBEC: 40.0000 mg | DELAYED_RELEASE_TABLET | Freq: Every day | ORAL | 2 refills | Status: DC

## 2019-10-19 MED ORDER — GABAPENTIN 600 MG PO TABS: 600.0000 mg | ORAL_TABLET | Freq: Every morning | ORAL | 2 refills | Status: DC

## 2019-10-19 MED ORDER — TRAZODONE HCL 150 MG PO TABS: 150.0000 mg | ORAL_TABLET | Freq: Every evening | ORAL | 2 refills | Status: DC

## 2019-10-19 MED ORDER — METFORMIN HCL 500 MG PO TABS: 500.0000 mg | ORAL_TABLET | Freq: Every day | ORAL | 2 refills | Status: DC

## 2019-10-19 MED ORDER — RANOLAZINE ER 500 MG PO TB12: 500.0000 mg | ORAL_TABLET | Freq: Two times a day (BID) | ORAL | 2 refills | Status: DC

## 2019-10-19 MED ORDER — SERTRALINE HCL 25 MG PO TABS: 25.0000 mg | ORAL_TABLET | Freq: Every day | ORAL | 2 refills | Status: DC

## 2019-10-19 MED ORDER — COLCHICINE-PROBENECID 0.5-500 MG PO TABS: 1.0000 | ORAL_TABLET | Freq: Two times a day (BID) | ORAL | 2 refills | Status: DC | PRN

## 2019-10-19 MED ORDER — TAMSULOSIN HCL 0.4 MG PO CAPS: 0.4000 mg | ORAL_CAPSULE | Freq: Every day | ORAL | 2 refills | Status: DC

## 2019-10-19 MED ORDER — BUDESONIDE-FORMOTEROL FUMARATE 80-4.5 MCG/ACT IN AERO: 2.0000 | INHALATION_SPRAY | Freq: Two times a day (BID) | RESPIRATORY_TRACT | 2 refills | Status: DC

## 2019-10-19 MED ORDER — INSULIN GLARGINE 100 UNIT/ML ~~LOC~~ SOLN: 30.0000 [IU] | Freq: Every day | SUBCUTANEOUS | 2 refills | Status: DC

## 2019-10-19 MED ORDER — ROSUVASTATIN CALCIUM 20 MG PO TABS: 20.0000 mg | ORAL_TABLET | Freq: Every day | ORAL | 2 refills | Status: DC

## 2019-10-19 MED ORDER — LISINOPRIL 10 MG PO TABS: 10.0000 mg | ORAL_TABLET | ORAL | 2 refills | Status: DC

## 2019-10-19 MED ORDER — MELOXICAM 15 MG PO TABS: 15.0000 mg | ORAL_TABLET | Freq: Every day | ORAL | 2 refills | Status: DC

## 2019-10-19 MED ORDER — PREDNISONE 10 MG (21) PO TBPK: ORAL_TABLET | ORAL | 0 refills | Status: DC

## 2019-10-19 MED ORDER — DICLOFENAC SODIUM 1 % EX GEL: 4.0000 g | Freq: Four times a day (QID) | CUTANEOUS | 2 refills | Status: DC

## 2019-10-19 NOTE — Assessment & Plan Note (Signed)
An individual plan was formulated based on patient history and exam, labs and evidence based data. Patient has not had recent angina or nitroglycerin use. continue present treatment. 

## 2019-10-19 NOTE — Assessment & Plan Note (Signed)

## 2019-10-19 NOTE — Assessment & Plan Note (Signed)
He is having COPD with exacerbation, he was given duoneb by nebulizer that helped his breathing.  Levaquin and prednisone prescribed , continue on his inhalers.

## 2019-10-19 NOTE — Progress Notes (Signed)
Acute Office Visit  Subjective:    Patient ID: Brent Ortiz, male    DOB: 01/03/1961, 59 y.o.   MRN: [id number]  Chief Complaint  Patient presents with  . COPD    HPI Patient is in today for Respiratory distress. He is SOB and coughing 1 month.using symbicort and albuterol.  Phlegm is clear.  Long history of COPD. No fever or chills.  Past Medical History:  Diagnosis Date  . Coronary artery disease   . Diabetes mellitus    niddm  . Gout   . HLD (hyperlipidemia)   . HTN (hypertension)   . Myocardial infarction ([place])    04-13-2011  . Osteoarthritis   . Pneumonia   . Poor dentition     Past Surgical History:  Procedure Laterality Date  . HERNIA REPAIR  2009  . I & D KNEE WITH POLY EXCHANGE  09/03/2011   Procedure: IRRIGATION AND DEBRIDEMENT KNEE WITH POLY EXCHANGE;  Surgeon: Mauri Pole, MD;  Location: [facility];  Service: Orthopedics;  Laterality: Left;  . TOOTH EXTRACTION Bilateral 08/06/2017   Procedure: DENTAL RESTORATION/EXTRACTIONS;  Surgeon: Diona Browner, DDS;  Location: [place];  Service: Oral Surgery;  Laterality: Bilateral;  . TOTAL KNEE ARTHROPLASTY   right  feb 2012   left march 2012 r  . TOTAL KNEE REVISION  06/23/2011   Procedure: TOTAL KNEE REVISION;  Surgeon: Mauri Pole;  Location: [facility];  Service: Orthopedics;  Laterality: Left;  femomal nerve block done in holding area without incident    Family History  Problem Relation Age of Onset  . Colon cancer Unknown   . Colon cancer Mother     Social History   Socioeconomic History  . Marital status: Married    Spouse name: Not on file  . Number of children: 5  . Years of education: Not on file  . Highest education level: Not on file  Occupational History  . Occupation: CNA  Tobacco Use  . Smoking status: Current Every Day Smoker    Packs/day: 1.50    Years: 37.00    Pack years: 55.50    Types: Cigarettes  . Smokeless tobacco: Never Used  Substance and Sexual Activity  . Alcohol use: No   Comment: heavy drinker in past   . Drug use: No  . Sexual activity: Not Currently  Other Topics Concern  . Not on file  Social History Narrative  . Not on file   Social Determinants of Health   Financial Resource Strain:   . Difficulty of Paying Living Expenses:   Food Insecurity:   . Worried About Charity fundraiser in the Last Year:   . Arboriculturist in the Last Year:   Transportation Needs:   . Film/video editor (Medical):   Marland Kitchen Lack of Transportation (Non-Medical):   Physical Activity:   . Days of Exercise per Week:   . Minutes of Exercise per Session:   Stress:   . Feeling of Stress :   Social Connections:   . Frequency of Communication with Friends and Family:   . Frequency of Social Gatherings with Friends and Family:   . Attends Religious Services:   . Active Member of Clubs or Organizations:   . Attends Archivist Meetings:   Marland Kitchen Marital Status:   Intimate Partner Violence:   . Fear of Current or Ex-Partner:   . Emotionally Abused:   Marland Kitchen Physically Abused:   . Sexually Abused:  Outpatient Medications Prior to Visit  Medication Sig Dispense Refill  . acetaminophen (TYLENOL) 500 MG tablet Take 500 mg by mouth every 6 (six) hours as needed for mild pain.     . Albuterol Sulfate (PROAIR RESPICLICK) 123XX123 (90 Base) MCG/ACT AEPB Inhale 2 puffs into the lungs every 4 (four) hours as needed (shortness of breath).     Marland Kitchen aspirin 81 MG tablet Take 81 mg by mouth daily.    Marland Kitchen atorvastatin (LIPITOR) 40 MG tablet Take 40 mg by mouth daily.    . diflorasone (PSORCON) 0.05 % ointment     . lidocaine-prilocaine (EMLA) cream     . oxyCODONE-acetaminophen (PERCOCET) 10-325 MG tablet TAKE 1 TABLET BY MOUTH  EVERY 6 HOURS AS NEEDED FOR PAIN  0  . budesonide-formoterol (SYMBICORT) 80-4.5 MCG/ACT inhaler Inhale 2 puffs into the lungs 2 (two) times daily.     . colchicine-probenecid 0.5-500 MG per tablet Take 1 tablet by mouth 2 (two) times daily as needed (GOUT).     Marland Kitchen  diclofenac Sodium (VOLTAREN) 1 % GEL Place onto the skin.    Marland Kitchen gabapentin (NEURONTIN) 600 MG tablet Take 1 tablet (600 mg total) by mouth every morning. 90 tablet 1  . insulin glargine (LANTUS) 100 UNIT/ML injection Inject into the skin.    Marland Kitchen lisinopril (PRINIVIL,ZESTRIL) 10 MG tablet Take 10 mg by mouth every morning.     . meloxicam (MOBIC) 15 MG tablet Take 15 mg by mouth daily.     . metFORMIN (GLUCOPHAGE) 500 MG tablet Take 500 mg by mouth daily with supper.     . nitroGLYCERIN (NITROSTAT) 0.4 MG SL tablet Place 0.4 mg under the tongue every 5 (five) minutes as needed. For chest pain    . pantoprazole (PROTONIX) 40 MG tablet Take 40 mg by mouth daily.     . ranolazine (RANEXA) 500 MG 12 hr tablet Take 500 mg by mouth 2 (two) times daily.    . rosuvastatin (CRESTOR) 20 MG tablet     . sertraline (ZOLOFT) 25 MG tablet     . tamsulosin (FLOMAX) 0.4 MG CAPS capsule Take by mouth.    . traZODone (DESYREL) 150 MG tablet Take 1 tablet (150 mg total) by mouth at bedtime. 30 tablet 2  . ezetimibe (ZETIA) 10 MG tablet Take by mouth.    . levofloxacin (LEVAQUIN) 500 MG tablet Take 1 tablet (500 mg total) by mouth daily. 7 tablet 0  . methocarbamol (ROBAXIN) 750 MG tablet     . oxyCODONE (OXY IR/ROXICODONE) 5 MG immediate release tablet Take 1 tablet (5 mg total) by mouth every 6 (six) hours as needed. 16 tablet 0  . predniSONE (STERAPRED UNI-PAK 21 TAB) 10 MG (21) TBPK tablet Prednisone 6 pills first day, then 5 pills second day, then taper by one a day until gone 21 tablet 0   No facility-administered medications prior to visit.    Allergies  Allergen Reactions  . Motrin [Ibuprofen] Other (See Comments)    Reaction: ulcers  . Other Nausea And Vomiting    Patient has ulcers    Review of Systems  Constitutional: Negative.   HENT: Negative.   Eyes: Negative.   Respiratory: Positive for cough and chest tightness.   Cardiovascular: Negative.   Gastrointestinal: Negative.   Endocrine:  Negative.   Musculoskeletal: Negative.   Skin: Negative.   Neurological: Negative.        Objective:    Physical Exam Vitals reviewed.  Constitutional:  Appearance: Normal appearance.  HENT:     Head: Atraumatic.     Nose: Nose normal.     Mouth/Throat:     Mouth: Mucous membranes are dry.  Cardiovascular:     Rate and Rhythm: Normal rate and regular rhythm.     Heart sounds: Normal heart sounds.  Pulmonary:     Effort: Respiratory distress present.     Breath sounds: Wheezing present.  Neurological:     General: No focal deficit present.     Mental Status: He is alert.     BP (!) 150/90   Pulse (!) 103   Temp (!) 96 F (35.6 C)   Resp 20   Ht 5\' 8"  (1.727 m)   Wt 285 lb 12.8 oz (129.6 kg)   SpO2 97%   BMI 43.46 kg/m  Wt Readings from Last 3 Encounters:  10/19/19 285 lb 12.8 oz (129.6 kg)  10/03/19 287 lb (130.2 kg)  08/06/17 293 lb (132.9 kg)    Health Maintenance Due  Topic Date Due  . Hepatitis C Screening  Never done  . PNEUMOCOCCAL POLYSACCHARIDE VACCINE AGE 6-64 HIGH RISK  Never done  . FOOT EXAM  Never done  . OPHTHALMOLOGY EXAM  Never done  . HIV Screening  Never done  . TETANUS/TDAP  Never done  . COLONOSCOPY  Never done  . HEMOGLOBIN A1C  03/06/2012    There are no preventive care reminders to display for this patient.   No results found for: TSH Lab Results  Component Value Date   WBC 10.6 (H) 08/06/2017   HGB 13.2 08/06/2017   HCT 38.7 (L) 08/06/2017   MCV 89.8 08/06/2017   PLT 435 (H) 08/06/2017   Lab Results  Component Value Date   NA 135 08/06/2017   K 3.8 08/06/2017   CO2 20 (L) 08/06/2017   GLUCOSE 124 (H) 08/06/2017   BUN 18 08/06/2017   CREATININE 1.02 08/06/2017   BILITOT 0.6 08/06/2017   ALKPHOS 64 08/06/2017   AST 17 08/06/2017   ALT 15 (L) 08/06/2017   PROT 6.4 (L) 08/06/2017   ALBUMIN 3.6 08/06/2017   CALCIUM 8.7 (L) 08/06/2017   ANIONGAP 11 08/06/2017   No results found for: CHOL No results found for:  HDL No results found for: LDLCALC No results found for: TRIG No results found for: CHOLHDL Lab Results  Component Value Date   HGBA1C 7.1 (H) 09/04/2011       Assessment & Plan:   Problem List Items Addressed This Visit      Cardiovascular and Mediastinum   CAD (coronary atherosclerotic disease)    An individual plan was formulated based on patient history and exam, labs and evidence based data. Patient has not had recent angina or nitroglycerin use. continue present treatment.      Relevant Medications   rosuvastatin (CRESTOR) 20 MG tablet   ranolazine (RANEXA) 500 MG 12 hr tablet   nitroGLYCERIN (NITROSTAT) 0.4 MG SL tablet   lisinopril (ZESTRIL) 10 MG tablet   Essential hypertension    An individual hypertension care plan was established and reinforced today.  The patient's status was assessed using clinical findings on exam and labs or diagnostic tests. The patient's success at meeting treatment goals on disease specific evidence-based guidelines and found to be well controlled. SELF MANAGEMENT: The patient and I together assessed ways to personally work towards obtaining the recommended goals. RECOMMENDATIONS: avoid decongestants found in common cold remedies, decrease consumption of alcohol, perform routine monitoring of  BP with home BP cuff, exercise, reduction of dietary salt, take medicines as prescribed, try not to miss doses and quit smoking.  Regular exercise and maintaining a healthy weight is needed.  Stress reduction may help. A CLINICAL SUMMARY including written plan identify barriers to care unique to individual due to social or financial issues.  We attempt to mutually creat solutions for individual and family understanding.      Relevant Medications   rosuvastatin (CRESTOR) 20 MG tablet   ranolazine (RANEXA) 500 MG 12 hr tablet   nitroGLYCERIN (NITROSTAT) 0.4 MG SL tablet   lisinopril (ZESTRIL) 10 MG tablet     Respiratory   Obstructive chronic bronchitis with  exacerbation (HCC) - Primary    He is having COPD with exacerbation, he was given duoneb by nebulizer that helped his breathing.  Levaquin and prednisone prescribed , continue on his inhalers.      Relevant Medications   budesonide-formoterol (SYMBICORT) 80-4.5 MCG/ACT inhaler   levofloxacin (LEVAQUIN) 500 MG tablet   predniSONE (STERAPRED UNI-PAK 21 TAB) 10 MG (21) TBPK tablet     Other   Hyperlipidemia   Relevant Medications   rosuvastatin (CRESTOR) 20 MG tablet   ranolazine (RANEXA) 500 MG 12 hr tablet   nitroGLYCERIN (NITROSTAT) 0.4 MG SL tablet   lisinopril (ZESTRIL) 10 MG tablet    Other Visit Diagnoses    Type 2 diabetes mellitus with diabetic neuropathy, with long-term current use of insulin (HCC)       Relevant Medications   rosuvastatin (CRESTOR) 20 MG tablet   metFORMIN (GLUCOPHAGE) 500 MG tablet   lisinopril (ZESTRIL) 10 MG tablet   insulin glargine (LANTUS) 100 UNIT/ML injection   gabapentin (NEURONTIN) 600 MG tablet   Gastroesophageal reflux disease with esophagitis without hemorrhage       Relevant Medications   pantoprazole (PROTONIX) 40 MG tablet   Benign prostatic hyperplasia with urinary hesitancy       Relevant Medications   tamsulosin (FLOMAX) 0.4 MG CAPS capsule   Depression, major, single episode, moderate (HCC)       Relevant Medications   traZODone (DESYREL) 150 MG tablet   sertraline (ZOLOFT) 25 MG tablet       Meds ordered this encounter  Medications  . traZODone (DESYREL) 150 MG tablet    Sig: Take 1 tablet (150 mg total) by mouth at bedtime.    Dispense:  90 tablet    Refill:  2  . tamsulosin (FLOMAX) 0.4 MG CAPS capsule    Sig: Take 1 capsule (0.4 mg total) by mouth daily.    Dispense:  90 capsule    Refill:  2  . sertraline (ZOLOFT) 25 MG tablet    Sig: Take 1 tablet (25 mg total) by mouth daily.    Dispense:  90 tablet    Refill:  2  . rosuvastatin (CRESTOR) 20 MG tablet    Sig: Take 1 tablet (20 mg total) by mouth daily.     Dispense:  90 tablet    Refill:  2  . ranolazine (RANEXA) 500 MG 12 hr tablet    Sig: Take 1 tablet (500 mg total) by mouth 2 (two) times daily.    Dispense:  180 tablet    Refill:  2  . pantoprazole (PROTONIX) 40 MG tablet    Sig: Take 1 tablet (40 mg total) by mouth daily.    Dispense:  90 tablet    Refill:  2  . nitroGLYCERIN (NITROSTAT) 0.4 MG SL tablet  Sig: Place 1 tablet (0.4 mg total) under the tongue every 5 (five) minutes as needed. For chest pain    Dispense:  50 tablet    Refill:  3  . metFORMIN (GLUCOPHAGE) 500 MG tablet    Sig: Take 1 tablet (500 mg total) by mouth daily with supper.    Dispense:  180 tablet    Refill:  2  . meloxicam (MOBIC) 15 MG tablet    Sig: Take 1 tablet (15 mg total) by mouth daily.    Dispense:  90 tablet    Refill:  2  . lisinopril (ZESTRIL) 10 MG tablet    Sig: Take 1 tablet (10 mg total) by mouth every morning.    Dispense:  90 tablet    Refill:  2  . insulin glargine (LANTUS) 100 UNIT/ML injection    Sig: Inject 0.3 mLs (30 Units total) into the skin at bedtime.    Dispense:  30 mL    Refill:  2  . gabapentin (NEURONTIN) 600 MG tablet    Sig: Take 1 tablet (600 mg total) by mouth every morning.    Dispense:  90 tablet    Refill:  2  . diclofenac Sodium (VOLTAREN) 1 % GEL    Sig: Apply 4 g topically 4 (four) times daily.    Dispense:  150 g    Refill:  2  . colchicine-probenecid 0.5-500 MG tablet    Sig: Take 1 tablet by mouth 2 (two) times daily as needed (GOUT).    Dispense:  180 tablet    Refill:  2  . budesonide-formoterol (SYMBICORT) 80-4.5 MCG/ACT inhaler    Sig: Inhale 2 puffs into the lungs 2 (two) times daily.    Dispense:  3 Inhaler    Refill:  2  . levofloxacin (LEVAQUIN) 500 MG tablet    Sig: Take 1 tablet (500 mg total) by mouth daily.    Dispense:  7 tablet    Refill:  0  . predniSONE (STERAPRED UNI-PAK 21 TAB) 10 MG (21) TBPK tablet    Sig: Prednisone 6 pills first day, then 5 pills second day, then taper by  one a day until gone    Dispense:  21 tablet    Refill:  0     Reinaldo Meeker, MD

## 2019-11-02 ENCOUNTER — Telehealth: Payer: Self-pay

## 2019-11-02 NOTE — Telephone Encounter (Signed)
Left message for patient about retinopathy screening at our office on May 6, requested call back to schedule.

## 2019-11-07 ENCOUNTER — Ambulatory Visit: Payer: Medicare HMO | Admitting: Legal Medicine

## 2019-11-07 ENCOUNTER — Encounter: Payer: Self-pay | Admitting: Legal Medicine

## 2019-11-08 DIAGNOSIS — Z79899 Other long term (current) drug therapy: Secondary | ICD-10-CM | POA: Diagnosis not present

## 2019-11-08 DIAGNOSIS — M25562 Pain in left knee: Secondary | ICD-10-CM | POA: Diagnosis not present

## 2019-11-08 DIAGNOSIS — G8929 Other chronic pain: Secondary | ICD-10-CM | POA: Diagnosis not present

## 2019-11-08 DIAGNOSIS — M545 Low back pain: Secondary | ICD-10-CM | POA: Diagnosis not present

## 2019-12-05 ENCOUNTER — Encounter: Payer: Self-pay | Admitting: Legal Medicine

## 2019-12-06 ENCOUNTER — Encounter: Payer: Self-pay | Admitting: Legal Medicine

## 2019-12-06 ENCOUNTER — Ambulatory Visit (INDEPENDENT_AMBULATORY_CARE_PROVIDER_SITE_OTHER): Payer: Medicare HMO | Admitting: Legal Medicine

## 2019-12-06 ENCOUNTER — Other Ambulatory Visit: Payer: Self-pay

## 2019-12-06 VITALS — BP 140/80 | HR 96 | Temp 97.3°F | Resp 16 | Ht 67.0 in | Wt 279.8 lb

## 2019-12-06 DIAGNOSIS — F1010 Alcohol abuse, uncomplicated: Secondary | ICD-10-CM | POA: Diagnosis not present

## 2019-12-06 DIAGNOSIS — M25562 Pain in left knee: Secondary | ICD-10-CM | POA: Diagnosis not present

## 2019-12-06 DIAGNOSIS — G894 Chronic pain syndrome: Secondary | ICD-10-CM

## 2019-12-06 DIAGNOSIS — Z79899 Other long term (current) drug therapy: Secondary | ICD-10-CM | POA: Diagnosis not present

## 2019-12-06 DIAGNOSIS — G8929 Other chronic pain: Secondary | ICD-10-CM | POA: Diagnosis not present

## 2019-12-06 DIAGNOSIS — E1142 Type 2 diabetes mellitus with diabetic polyneuropathy: Secondary | ICD-10-CM | POA: Diagnosis not present

## 2019-12-06 DIAGNOSIS — E782 Mixed hyperlipidemia: Secondary | ICD-10-CM

## 2019-12-06 DIAGNOSIS — N5201 Erectile dysfunction due to arterial insufficiency: Secondary | ICD-10-CM

## 2019-12-06 DIAGNOSIS — N529 Male erectile dysfunction, unspecified: Secondary | ICD-10-CM

## 2019-12-06 DIAGNOSIS — I10 Essential (primary) hypertension: Secondary | ICD-10-CM | POA: Diagnosis not present

## 2019-12-06 DIAGNOSIS — F1110 Opioid abuse, uncomplicated: Secondary | ICD-10-CM | POA: Diagnosis not present

## 2019-12-06 DIAGNOSIS — M25561 Pain in right knee: Secondary | ICD-10-CM | POA: Diagnosis not present

## 2019-12-06 DIAGNOSIS — J441 Chronic obstructive pulmonary disease with (acute) exacerbation: Secondary | ICD-10-CM | POA: Diagnosis not present

## 2019-12-06 DIAGNOSIS — Z794 Long term (current) use of insulin: Secondary | ICD-10-CM

## 2019-12-06 DIAGNOSIS — F321 Major depressive disorder, single episode, moderate: Secondary | ICD-10-CM

## 2019-12-06 DIAGNOSIS — E119 Type 2 diabetes mellitus without complications: Secondary | ICD-10-CM | POA: Diagnosis not present

## 2019-12-06 HISTORY — DX: Male erectile dysfunction, unspecified: N52.9

## 2019-12-06 HISTORY — DX: Type 2 diabetes mellitus with diabetic polyneuropathy: E11.42

## 2019-12-06 MED ORDER — SILDENAFIL CITRATE 20 MG PO TABS: 20.0000 mg | ORAL_TABLET | ORAL | 2 refills | Status: DC

## 2019-12-06 MED ORDER — SERTRALINE HCL 25 MG PO TABS: 25.0000 mg | ORAL_TABLET | Freq: Every day | ORAL | 2 refills | Status: DC

## 2019-12-06 NOTE — Progress Notes (Signed)
Established Patient Office Visit  Subjective:  Patient ID: Brent Ortiz, male    DOB: 08-15-1960  Age: 59 y.o. MRN: [id number]  CC:  Chief Complaint  Patient presents with  . Diabetes  . Hyperlipidemia  . Hypertension    HPI Brent Ortiz presents for Chronic visit.  Patient present with type 2 diabetes.  Specifically, this is type 2, insulin requiring diabetes, complicated by polyneuropathy.  Compliance with treatment has been good; patient take medicines as directed, maintains diet and exercise regimen, follows up as directed, and is keeping glucose diary.  Date of  diagnosis 2010.  Depression screen has been performed.Tobacco screen smoking. Current medicines for diabetes lantus 30 units qd, metformin.  Patient is on lisinopril for renal protection and atorvastatin for cholesterol control.  Patient performs foot exams daily and last ophthalmologic exam was 2 years.  Patient presents for follow up of hypertension.  Patient tolerating lisinopril,  well with side effects.  Patient was diagnosed with hypertension 2010 so has been treated for hypertension for 10 years.Patient is working on maintaining diet and exercise regimen and follows up as directed. Complication include CAD  Patient presents with hyperlipidemia.  Compliance with treatment has been good; patient takes medicines as directed, maintains low cholesterol diet, follows up as directed, and maintains exercise regimen.  Patient is using atorvastatin without problems.  CORONARY ARTERY DISEASE  Patient presents in follow up of CAD. Patient was diagnosed in 2015. The patient has no associated CHF. The patient is currently taking a beta blocker, statin, and aspirin. CAD was diagnosed 10 years ago.  Patient is having no angina. Patient has used no NTG.  Patient is followed by cardiology.  Patient had none . Last angiography was 2015, last echocardiogram unknown.  Past Medical History:  Diagnosis Date  . Coronary artery disease    . Gout   . HTN (hypertension)   . Leukocytosis 09/04/2011  . Myocardial infarction ([place])    04-13-2011  . Osteoarthritis   . Pneumonia   . Poor dentition   . S/P left knee revision 06/23/2011  . Streptococcus pneumoniae infection 09/29/2011    Past Surgical History:  Procedure Laterality Date  . HERNIA REPAIR  2009  . I & D KNEE WITH POLY EXCHANGE  09/03/2011   Procedure: IRRIGATION AND DEBRIDEMENT KNEE WITH POLY EXCHANGE;  Surgeon: Mauri Pole, MD;  Location: [facility];  Service: Orthopedics;  Laterality: Left;  . TOOTH EXTRACTION Bilateral 08/06/2017   Procedure: DENTAL RESTORATION/EXTRACTIONS;  Surgeon: Diona Browner, DDS;  Location: [place];  Service: Oral Surgery;  Laterality: Bilateral;  . TOTAL KNEE ARTHROPLASTY   right  feb 2012   left march 2012 r  . TOTAL KNEE REVISION  06/23/2011   Procedure: TOTAL KNEE REVISION;  Surgeon: Mauri Pole;  Location: [facility];  Service: Orthopedics;  Laterality: Left;  femomal nerve block done in holding area without incident    Family History  Problem Relation Age of Onset  . Colon cancer Other   . Colon cancer Mother     Social History   Socioeconomic History  . Marital status: Married    Spouse name: Not on file  . Number of children: 5  . Years of education: Not on file  . Highest education level: Not on file  Occupational History  . Occupation: CNA  Tobacco Use  . Smoking status: Current Every Day Smoker    Packs/day: 1.50    Years: 37.00    Pack years: 55.50  Types: Cigarettes  . Smokeless tobacco: Never Used  Substance and Sexual Activity  . Alcohol use: No    Comment: heavy drinker in past   . Drug use: No  . Sexual activity: Not Currently  Other Topics Concern  . Not on file  Social History Narrative  . Not on file   Social Determinants of Health   Financial Resource Strain:   . Difficulty of Paying Living Expenses:   Food Insecurity:   . Worried About Charity fundraiser in the Last Year:   . Academic librarian in the Last Year:   Transportation Needs:   . Film/video editor (Medical):   Marland Kitchen Lack of Transportation (Non-Medical):   Physical Activity:   . Days of Exercise per Week:   . Minutes of Exercise per Session:   Stress:   . Feeling of Stress :   Social Connections:   . Frequency of Communication with Friends and Family:   . Frequency of Social Gatherings with Friends and Family:   . Attends Religious Services:   . Active Member of Clubs or Organizations:   . Attends Archivist Meetings:   Marland Kitchen Marital Status:   Intimate Partner Violence:   . Fear of Current or Ex-Partner:   . Emotionally Abused:   Marland Kitchen Physically Abused:   . Sexually Abused:     Outpatient Medications Prior to Visit  Medication Sig Dispense Refill  . acetaminophen (TYLENOL) 500 MG tablet Take 500 mg by mouth every 6 (six) hours as needed for mild pain.     . Albuterol Sulfate (PROAIR RESPICLICK) 123XX123 (90 Base) MCG/ACT AEPB Inhale 2 puffs into the lungs every 4 (four) hours as needed (shortness of breath).     Marland Kitchen aspirin 81 MG tablet Take 81 mg by mouth daily.    Marland Kitchen atorvastatin (LIPITOR) 40 MG tablet Take 40 mg by mouth daily.    . budesonide-formoterol (SYMBICORT) 80-4.5 MCG/ACT inhaler Inhale 2 puffs into the lungs 2 (two) times daily. 3 Inhaler 2  . colchicine-probenecid 0.5-500 MG tablet Take 1 tablet by mouth 2 (two) times daily as needed (GOUT). 180 tablet 2  . diclofenac Sodium (VOLTAREN) 1 % GEL Apply 4 g topically 4 (four) times daily. 150 g 2  . gabapentin (NEURONTIN) 600 MG tablet Take 1 tablet (600 mg total) by mouth every morning. 90 tablet 2  . lisinopril (ZESTRIL) 10 MG tablet Take 1 tablet (10 mg total) by mouth every morning. 90 tablet 2  . meloxicam (MOBIC) 15 MG tablet Take 1 tablet (15 mg total) by mouth daily. 90 tablet 2  . metFORMIN (GLUCOPHAGE) 500 MG tablet Take 1 tablet (500 mg total) by mouth daily with supper. 180 tablet 2  . nitroGLYCERIN (NITROSTAT) 0.4 MG SL tablet Place 1  tablet (0.4 mg total) under the tongue every 5 (five) minutes as needed. For chest pain 50 tablet 3  . oxyCODONE-acetaminophen (PERCOCET) 10-325 MG tablet TAKE 1 TABLET BY MOUTH  EVERY 6 HOURS AS NEEDED FOR PAIN  0  . pantoprazole (PROTONIX) 40 MG tablet Take 1 tablet (40 mg total) by mouth daily. 90 tablet 2  . ranolazine (RANEXA) 500 MG 12 hr tablet Take 1 tablet (500 mg total) by mouth 2 (two) times daily. 180 tablet 2  . sertraline (ZOLOFT) 25 MG tablet Take 1 tablet (25 mg total) by mouth daily. 90 tablet 2  . tamsulosin (FLOMAX) 0.4 MG CAPS capsule Take 1 capsule (0.4 mg total) by mouth daily.  90 capsule 2  . traZODone (DESYREL) 150 MG tablet Take 1 tablet (150 mg total) by mouth at bedtime. 90 tablet 2  . diflorasone (PSORCON) 0.05 % ointment     . insulin glargine (LANTUS) 100 UNIT/ML injection Inject 0.3 mLs (30 Units total) into the skin at bedtime. 30 mL 2  . predniSONE (STERAPRED UNI-PAK 21 TAB) 10 MG (21) TBPK tablet Prednisone 6 pills first day, then 5 pills second day, then taper by one a day until gone 21 tablet 0  . LANTUS SOLOSTAR 100 UNIT/ML Solostar Pen Inject 30 Units into the skin daily.    Marland Kitchen levofloxacin (LEVAQUIN) 500 MG tablet Take 1 tablet (500 mg total) by mouth daily. 7 tablet 0  . lidocaine-prilocaine (EMLA) cream     . rosuvastatin (CRESTOR) 20 MG tablet Take 1 tablet (20 mg total) by mouth daily. 90 tablet 2   No facility-administered medications prior to visit.    Allergies  Allergen Reactions  . Motrin [Ibuprofen] Other (See Comments)    Reaction: ulcers  . Other Nausea And Vomiting    Patient has ulcers    ROS Review of Systems  Constitutional: Negative.   HENT: Negative.   Eyes: Negative.   Respiratory: Negative.   Cardiovascular: Negative.   Gastrointestinal: Negative.   Genitourinary: Negative.   Musculoskeletal: Positive for arthralgias.       Knee and back pain  Neurological: Negative.   Hematological: Negative.   Psychiatric/Behavioral:  Negative.       Objective:    Physical Exam  Constitutional: He is oriented to person, place, and time. He appears well-developed and well-nourished.  HENT:  Head: Normocephalic and atraumatic.  Right Ear: External ear normal.  Left Ear: External ear normal.  Nose: Nose normal.  Mouth/Throat: Oropharynx is clear and moist.  Eyes: Pupils are equal, round, and reactive to light. Conjunctivae and EOM are normal.  Cardiovascular: Regular rhythm, normal heart sounds and intact distal pulses.  Pulmonary/Chest: Effort normal and breath sounds normal.  Abdominal: Soft. Bowel sounds are normal.  Musculoskeletal:        General: Normal range of motion.     Cervical back: Normal range of motion and neck supple.  Neurological: He is alert and oriented to person, place, and time. He has normal reflexes.  Skin: Skin is warm and dry.  Psychiatric: He has a normal mood and affect. His behavior is normal. Judgment and thought content normal.  Vitals reviewed.   BP (!) 150/94   Pulse 96   Temp (!) 97.3 F (36.3 C)   Resp 16   Ht 5\' 7"  (1.702 m)   Wt 279 lb 12.8 oz (126.9 kg)   SpO2 98%   BMI 43.82 kg/m  Wt Readings from Last 3 Encounters:  12/06/19 279 lb 12.8 oz (126.9 kg)  10/19/19 285 lb 12.8 oz (129.6 kg)  10/03/19 287 lb (130.2 kg)     Health Maintenance Due  Topic Date Due  . Hepatitis C Screening  Never done  . PNEUMOCOCCAL POLYSACCHARIDE VACCINE AGE 6-64 HIGH RISK  Never done  . COVID-19 Vaccine (1) Never done  . HIV Screening  Never done  . TETANUS/TDAP  Never done  . HEMOGLOBIN A1C  03/06/2012  . OPHTHALMOLOGY EXAM  08/20/2019    There are no preventive care reminders to display for this patient.  No results found for: TSH Lab Results  Component Value Date   WBC 10.6 (H) 08/06/2017   HGB 13.2 08/06/2017   HCT 38.7 (  L) 08/06/2017   MCV 89.8 08/06/2017   PLT 435 (H) 08/06/2017   Lab Results  Component Value Date   NA 135 08/06/2017   K 3.8 08/06/2017   CO2  20 (L) 08/06/2017   GLUCOSE 124 (H) 08/06/2017   BUN 18 08/06/2017   CREATININE 1.02 08/06/2017   BILITOT 0.6 08/06/2017   ALKPHOS 64 08/06/2017   AST 17 08/06/2017   ALT 15 (L) 08/06/2017   PROT 6.4 (L) 08/06/2017   ALBUMIN 3.6 08/06/2017   CALCIUM 8.7 (L) 08/06/2017   ANIONGAP 11 08/06/2017   No results found for: CHOL No results found for: HDL No results found for: LDLCALC No results found for: TRIG No results found for: CHOLHDL Lab Results  Component Value Date   HGBA1C 7.1 (H) 09/04/2011      Assessment & Plan:   Hypertension: An individual hypertension care plan was established and reinforced today.  The patient's status was assessed using clinical findings on exam and labs or diagnostic tests. The patient's success at meeting treatment goals on disease specific evidence-based guidelines and found to be well controlled. SELF MANAGEMENT: The patient and I together assessed ways to personally work towards obtaining the recommended goals. RECOMMENDATIONS: avoid decongestants found in common cold remedies, decrease consumption of alcohol, perform routine monitoring of BP with home BP cuff, exercise, reduction of dietary salt, take medicines as prescribed, try not to miss doses and quit smoking.  Regular exercise and maintaining a healthy weight is needed.  Stress reduction may help. A CLINICAL SUMMARY including written plan identify barriers to care unique to individual due to social or financial issues.  We attempt to mutually creat solutions for individual and family understanding.  CAD:  An individual plan was formulated based on patient history and exam, labs and evidence based data. Patient was not had recent angina or nitroglycerin use. continue present treatment.  COPD: An individualize plan was formulated for care of COPD.  Treatment is evidence based.  She will continue on inhalers, avoid smoking and smoke.  Regular exercise with help with dyspnea. Routine follow ups  and medication compliance is needed.  Diabetes with polyneuropathy: An individual care plan for diabetes was established and reinforced today.  The patient's status was assessed using clinical findings on exam, labs and diagnostic testing. Patient success at meeting goals based on disease specific evidence-based guidelines and found to be good controlled. Medications were assessed and patient's understanding of the medical issues , including barriers were assessed. Recommend adherence to a diabetic diet, a graduated exercise program, HgbA1c level is checked quarterly, and urine microalbumin performed yearly .  Annual mono-filament sensation testing performed. Lower blood pressure and control hyperlipidemia is important. Get annual eye exams and annual flu shots and smoking cessation discussed.  Self management goals were discussed.  Hyperlipidemia: AN INDIVIDUAL CARE PLAN for hyperlipidemia/ cholesterol was established and reinforced today.  The patient's status was assessed using clinical findings on exam, lab and other diagnostic tests. The patient's disease status was assessed based on evidence-based guidelines and found to be well controlled. MEDICATIONS were reviewed. SELF MANAGEMENT GOALS have been discussed and patient's success at attaining the goal of low cholesterol was assessed. RECOMMENDATION given include regular exercise 3 days a week and low cholesterol/low fat diet. CLINICAL SUMMARY including written plan to identify barriers unique to the patient due to social or economic  reasons was discussed.  Morbid obesity: AN INDIVIDUAL CARE PLAN for hyperlipidemia/ cholesterol was established and reinforced today.  The patient's  status was assessed using clinical findings on exam, lab and other diagnostic tests. The patient's disease status was assessed based on evidence-based guidelines and found to be well controlled. MEDICATIONS were reviewed. SELF MANAGEMENT GOALS have been discussed and  patient's success at attaining the goal of low cholesterol was assessed. RECOMMENDATION given include regular exercise 3 days a week and low cholesterol/low fat diet. CLINICAL SUMMARY including written plan to identify barriers unique to the patient due to social or economic  reasons was discussed.  Tobacco abuse: Individual plan was given to patient based on exam, history and other tests and using evidence based criteria for care for smoking cessation.  We discussed behavioral changes to help cessation and offered medicnes to aid in quitting.  Medical consequences of tobacco use were explained.  Chronic pain: AN INDIVIDUAL CARE PLAN was established and reinforced today.  The patient's status was assessed using clinical findings on exam, labs, and other diagnostic testing. Patient's success at meeting treatment goals based on disease specific evidence-bassed guidelines and found to be in fair control. RECOMMENDATIONS include maintining present medicines and treatment. He is on chronicoxycodone with no abuse.  Negative REMS.  No orders of the defined types were placed in this encounter.  Return in about 4 months (around 04/06/2020) for fasting.   Reinaldo Meeker, MD

## 2019-12-06 NOTE — Patient Instructions (Signed)
Neuropathic Arthropathy Neuropathic arthropathy is a condition that results from poor circulation and numbness in the foot and ankle. Poor blood supply and numbness can cause foot or ankle injuries that are too small to be noticed or treated (microtrauma). You may continue to walk on your damaged foot and make the condition worse. Poor circulation can also cause poor healing and bone weakness. Over time, this condition can lead to:  Broken bones.  Bone infection.  Joint damage or dislocation.  Deformities, such as a collapsed foot arch.  Disability of the foot or ankle. Neuropathic arthropathy is also called Charcot arthropathy or Charcot foot. What are the causes? This condition may be caused by any disease that damages nerves and blood vessels of the foot and ankle. Diabetes is the most common cause. In this case, neuropathic arthropathy may also be called diabetic foot.  Less common causes of this condition include:  Polio.  Leprosy.  Syphilis.  Spinal cord damage.  Long-term (chronic) alcoholism. What are the signs or symptoms? Early symptoms of this condition include:  Foot swelling without a known injury and with very little pain.  Warmth and redness. Later symptoms may include:  Foot deformity.  Ankle instability.  Foot sores (ulcers). How is this diagnosed? This condition may be diagnosed based on:  Your symptoms and medical history.  A physical exam.  Tests, such as: ? Blood tests to check for signs of infection or poorly controlled diabetes. ? X-rays of the ankle and foot to look for bone damage. ? Imaging tests to check for soft tissue or joint damage, such as an MRI or ultrasound. ? A bone scan to check for a bone infection. How is this treated? Treatment depends on the severity of the damage. If the damage is minor, treatment may include:  Wearing a brace, boot, or cast to protect and support the foot and ankle.  Using an assistive device such as a  walker, a knee walker, a wheelchair, or crutches to keep weight off the foot so it can heal. If you have a bone or soft tissue infection, you may receive antibiotics. More severe cases may require surgery to repair fractures, remove diseased bone or tissues, or reconstruct the foot or ankle. After treatment, you may need to wear a special cushioned shoe or boot for protection. Follow these instructions at home: If you have a brace, boot, or shoe:  Wear it as told by your health care provider. Remove it only as told by your health care provider.  Loosen it if your toes tingle, become numb, or turn cold and blue.  Keep it clean and dry. If you have a cast:  Do not put pressure on any part of the cast until it is fully hardened. This may take several hours.  Do not stick anything inside the cast to scratch your skin. Doing that increases your risk of infection.  Check the skin around the cast every day. Tell your health care provider about any concerns.  You may put lotion on dry skin around the edges of the cast. Do not put lotion on the skin underneath the cast.  Keep it clean and dry. Bathing If you have a cast, brace, boot, or shoe that is not waterproof:  Do not let it get wet.  Cover it with a watertight covering when you take a bath or shower. Activity   Return to your normal activities as told by your health care provider. Ask your health care provider what activities   are safe for you.  Avoid sitting for a long time without moving. Get up to take short walks every 1-2 hours. This is important to improve blood flow and breathing. Ask for help if you feel weak or unsteady.  Do not use the injured limb to support your body weight until your health care provider says that you can. Use a walker, a knee walker, a wheelchair, or crutches as told by your health care provider. General instructions      Check your feet every day for any redness, warmth, swelling, or  ulceration.  Do not walk barefooted. This could lead to injuries to your foot. Wear well-fitted footwear.  Take over-the-counter and prescription medicines only as told by your health care provider.  If you were prescribed an antibiotic medicine, take it as told by your health care provider. Do not stop using the antibiotic even if you start to feel better.  Do not use any products that contain nicotine or tobacco, such as cigarettes, e-cigarettes, and chewing tobacco. If you need help quitting, ask your health care provider.  Keep all follow-up visits as told by your health care provider. This is important. Contact a health care provider if you have:  A fever or chills.  Any new or worsening symptoms, such as redness, discharge, swelling, warmth, or ulcers on your foot.  Any problems with your cast, brace, or boot. Summary  Neuropathic arthropathy is a condition that results from poor circulation and numbness in the foot and ankle.  Poor blood supply and numbness can cause very small injuries (microtrauma) to your foot or ankle. Continuing to walk on your damaged foot can make the condition worse.  Any disease that damages nerves and blood vessels of the foot and ankle can cause neuropathic arthropathy. Diabetes is the most common cause.  Foot swelling without a known injury is the most common early symptom.  If the damage is minor, treatment may include wearing a brace, boot, or cast and using a walker, a knee walker, a wheelchair, or crutches. This information is not intended to replace advice given to you by your health care provider. Make sure you discuss any questions you have with your health care provider. Document Revised: 01/11/2019 Document Reviewed: 12/06/2018 [employer] Patient Education  2020 [employer]  

## 2019-12-07 ENCOUNTER — Other Ambulatory Visit: Payer: Self-pay

## 2019-12-07 LAB — CARDIOVASCULAR RISK ASSESSMENT

## 2019-12-07 LAB — HEMOGLOBIN A1C
Est. average glucose Bld gHb Est-mCnc: 146 mg/dL
Hgb A1c MFr Bld: 6.7 % — ABNORMAL HIGH (ref 4.8–5.6)

## 2019-12-07 LAB — COMPREHENSIVE METABOLIC PANEL
ALT: 13 IU/L (ref 0–44)
AST: 19 IU/L (ref 0–40)
Albumin/Globulin Ratio: 1.6 (ref 1.2–2.2)
Albumin: 4.4 g/dL (ref 3.8–4.8)
Alkaline Phosphatase: 71 IU/L (ref 48–121)
BUN/Creatinine Ratio: 10 (ref 10–24)
BUN: 10 mg/dL (ref 8–27)
Bilirubin Total: 0.7 mg/dL (ref 0.0–1.2)
CO2: 23 mmol/L (ref 20–29)
Calcium: 10 mg/dL (ref 8.6–10.2)
Chloride: 97 mmol/L (ref 96–106)
Creatinine, Ser: 0.99 mg/dL (ref 0.76–1.27)
GFR calc Af Amer: 95 mL/min/{1.73_m2} (ref >59)
GFR calc non Af Amer: 82 mL/min/{1.73_m2} (ref >59)
Globulin, Total: 2.8 g/dL (ref 1.5–4.5)
Glucose: 131 mg/dL — ABNORMAL HIGH (ref 65–99)
Potassium: 4 mmol/L (ref 3.5–5.2)
Sodium: 138 mmol/L (ref 134–144)
Total Protein: 7.2 g/dL (ref 6.0–8.5)

## 2019-12-07 LAB — CBC WITH DIFFERENTIAL/PLATELET
Basophils Absolute: 0.1 10*3/uL (ref 0.0–0.2)
Basos: 0 %
EOS (ABSOLUTE): 0.1 10*3/uL (ref 0.0–0.4)
Eos: 1 %
Hematocrit: 44.3 % (ref 37.5–51.0)
Hemoglobin: 15.5 g/dL (ref 13.0–17.7)
Immature Grans (Abs): 0 10*3/uL (ref 0.0–0.1)
Immature Granulocytes: 0 %
Lymphocytes Absolute: 2.9 10*3/uL (ref 0.7–3.1)
Lymphs: 19 %
MCH: 32.3 pg (ref 26.6–33.0)
MCHC: 35 g/dL (ref 31.5–35.7)
MCV: 92 fL (ref 79–97)
Monocytes Absolute: 1 10*3/uL — ABNORMAL HIGH (ref 0.1–0.9)
Monocytes: 7 %
Neutrophils Absolute: 11 10*3/uL — ABNORMAL HIGH (ref 1.4–7.0)
Neutrophils: 73 %
Platelets: 511 10*3/uL — ABNORMAL HIGH (ref 150–450)
RBC: 4.8 x10E6/uL (ref 4.14–5.80)
RDW: 13.5 % (ref 11.6–15.4)
WBC: 15 10*3/uL — ABNORMAL HIGH (ref 3.4–10.8)

## 2019-12-07 LAB — LIPID PANEL
Chol/HDL Ratio: 5.1 ratio — ABNORMAL HIGH (ref 0.0–5.0)
Cholesterol, Total: 276 mg/dL — ABNORMAL HIGH (ref 100–199)
HDL: 54 mg/dL (ref >39)
LDL Chol Calc (NIH): 193 mg/dL — ABNORMAL HIGH (ref 0–99)
Triglycerides: 159 mg/dL — ABNORMAL HIGH (ref 0–149)
VLDL Cholesterol Cal: 29 mg/dL (ref 5–40)

## 2019-12-07 NOTE — Progress Notes (Signed)
WBC high, ? Infection, no anemia, glucose 131, kidney tests normal, liver tests normal triglycerides high, LDL very high- need to be on statin, A1c 5.7 lp ,

## 2019-12-08 ENCOUNTER — Other Ambulatory Visit: Payer: Self-pay | Admitting: Legal Medicine

## 2019-12-08 NOTE — Progress Notes (Unsigned)
Atorvastatin

## 2019-12-18 ENCOUNTER — Encounter: Payer: Self-pay | Admitting: Legal Medicine

## 2019-12-18 ENCOUNTER — Telehealth (INDEPENDENT_AMBULATORY_CARE_PROVIDER_SITE_OTHER): Payer: Medicare HMO | Admitting: Legal Medicine

## 2019-12-18 VITALS — BP 139/74 | HR 87 | Temp 98.3°F | Ht 68.0 in | Wt 287.0 lb

## 2019-12-18 DIAGNOSIS — J441 Chronic obstructive pulmonary disease with (acute) exacerbation: Secondary | ICD-10-CM

## 2019-12-18 MED ORDER — LEVOFLOXACIN 500 MG PO TABS: 500.0000 mg | ORAL_TABLET | Freq: Every day | ORAL | 0 refills | Status: DC

## 2019-12-18 MED ORDER — PREDNISONE 10 MG (21) PO TBPK: ORAL_TABLET | ORAL | 0 refills | Status: DC

## 2019-12-18 NOTE — Progress Notes (Signed)
Virtual Visit via Telephone Note   This visit type was conducted due to national recommendations for restrictions regarding the COVID-19 Pandemic (e.g. social distancing) in an effort to limit this patient's exposure and mitigate transmission in our community.  Due to his co-morbid illnesses, this patient is at least at moderate risk for complications without adequate follow up.  This format is felt to be most appropriate for this patient at this time.  The patient did not have access to video technology/had technical difficulties with video requiring transitioning to audio format only (telephone).  All issues noted in this document were discussed and addressed.  No physical exam could be performed with this format.  Patient verbally consented to a telehealth visit.   Date:  12/18/2019   ID:  Brent Ortiz, DOB 22-Sep-1960, MRN [id number]  Patient Location: Home Provider Location: Office  PCP:  [name], MD   Evaluation Performed:  New Patient Evaluation  Chief Complaint:  Copd with exercerbation  History of Present Illness:    Brent Ortiz is a 58 y.o. male with patient having copd excerebration for 2 days, he is on Symbicort and pro air.  He is still smoking- we discussed cessation.    The patient does not have symptoms concerning for COVID-19 infection (fever, chills, cough, or new shortness of breath).    Past Medical History:  Diagnosis Date  . Coronary artery disease   . Gout   . HTN (hypertension)   . Leukocytosis 09/04/2011  . Myocardial infarction ([place])    04-13-2011  . Osteoarthritis   . Pneumonia   . Poor dentition   . S/P left knee revision 06/23/2011  . Streptococcus pneumoniae infection 09/29/2011    Past Surgical History:  Procedure Laterality Date  . HERNIA REPAIR  2009  . I & D KNEE WITH POLY EXCHANGE  09/03/2011   Procedure: IRRIGATION AND DEBRIDEMENT KNEE WITH POLY EXCHANGE;  Surgeon: Mauri Pole, MD;  Location: [facility];  Service:  Orthopedics;  Laterality: Left;  . TOOTH EXTRACTION Bilateral 08/06/2017   Procedure: DENTAL RESTORATION/EXTRACTIONS;  Surgeon: Diona Browner, DDS;  Location: [facility];  Service: Oral Surgery;  Laterality: Bilateral;  . TOTAL KNEE ARTHROPLASTY   right  feb 2012   left march 2012 r  . TOTAL KNEE REVISION  06/23/2011   Procedure: TOTAL KNEE REVISION;  Surgeon: Mauri Pole;  Location: [facility];  Service: Orthopedics;  Laterality: Left;  femomal nerve block done in holding area without incident    Family History  Problem Relation Age of Onset  . Colon cancer Other   . Colon cancer Mother     Social History   Socioeconomic History  . Marital status: Married    Spouse name: Not on file  . Number of children: 5  . Years of education: Not on file  . Highest education level: Not on file  Occupational History  . Occupation: CNA  Tobacco Use  . Smoking status: Current Every Day Smoker    Packs/day: 1.50    Years: 37.00    Pack years: 55.50    Types: Cigarettes  . Smokeless tobacco: Never Used  Vaping Use  . Vaping Use: Former  Substance and Sexual Activity  . Alcohol use: No    Comment: heavy drinker in past   . Drug use: No  . Sexual activity: Not Currently  Other Topics Concern  . Not on file  Social History Narrative  . Not on file   Social  Determinants of Health   Financial Resource Strain:   . Difficulty of Paying Living Expenses:   Food Insecurity:   . Worried About Charity fundraiser in the Last Year:   . Arboriculturist in the Last Year:   Transportation Needs:   . Film/video editor (Medical):   Marland Kitchen Lack of Transportation (Non-Medical):   Physical Activity:   . Days of Exercise per Week:   . Minutes of Exercise per Session:   Stress:   . Feeling of Stress :   Social Connections:   . Frequency of Communication with Friends and Family:   . Frequency of Social Gatherings with Friends and Family:   . Attends Religious Services:   . Active Member of Clubs or  Organizations:   . Attends Archivist Meetings:   Marland Kitchen Marital Status:   Intimate Partner Violence:   . Fear of Current or Ex-Partner:   . Emotionally Abused:   Marland Kitchen Physically Abused:   . Sexually Abused:     Outpatient Medications Prior to Visit  Medication Sig Dispense Refill  . acetaminophen (TYLENOL) 500 MG tablet Take 500 mg by mouth every 6 (six) hours as needed for mild pain.     . Albuterol Sulfate (PROAIR RESPICLICK) 409 (90 Base) MCG/ACT AEPB Inhale 2 puffs into the lungs every 4 (four) hours as needed (shortness of breath).     Marland Kitchen aspirin 81 MG tablet Take 81 mg by mouth daily.    Marland Kitchen atorvastatin (LIPITOR) 40 MG tablet Take 40 mg by mouth daily.    . budesonide-formoterol (SYMBICORT) 80-4.5 MCG/ACT inhaler Inhale 2 puffs into the lungs 2 (two) times daily. 3 Inhaler 2  . colchicine-probenecid 0.5-500 MG tablet Take 1 tablet by mouth 2 (two) times daily as needed (GOUT). 180 tablet 2  . diclofenac Sodium (VOLTAREN) 1 % GEL Apply 4 g topically 4 (four) times daily. 150 g 2  . gabapentin (NEURONTIN) 600 MG tablet Take 1 tablet (600 mg total) by mouth every morning. 90 tablet 2  . LANTUS SOLOSTAR 100 UNIT/ML Solostar Pen Inject 30 Units into the skin daily.    Marland Kitchen lisinopril (ZESTRIL) 10 MG tablet Take 1 tablet (10 mg total) by mouth every morning. 90 tablet 2  . meloxicam (MOBIC) 15 MG tablet Take 1 tablet (15 mg total) by mouth daily. 90 tablet 2  . metFORMIN (GLUCOPHAGE) 500 MG tablet Take 1 tablet (500 mg total) by mouth daily with supper. 180 tablet 2  . nitroGLYCERIN (NITROSTAT) 0.4 MG SL tablet Place 1 tablet (0.4 mg total) under the tongue every 5 (five) minutes as needed. For chest pain 50 tablet 3  . oxyCODONE-acetaminophen (PERCOCET) 10-325 MG tablet TAKE 1 TABLET BY MOUTH  EVERY 6 HOURS AS NEEDED FOR PAIN  0  . pantoprazole (PROTONIX) 40 MG tablet Take 1 tablet (40 mg total) by mouth daily. 90 tablet 2  . ranolazine (RANEXA) 500 MG 12 hr tablet Take 1 tablet (500 mg  total) by mouth 2 (two) times daily. 180 tablet 2  . sertraline (ZOLOFT) 25 MG tablet Take 1 tablet (25 mg total) by mouth daily. 90 tablet 2  . sildenafil (REVATIO) 20 MG tablet Take 1 tablet (20 mg total) by mouth as directed. Take 3 to 5 20 minutes before intercourse 50 tablet 2  . tamsulosin (FLOMAX) 0.4 MG CAPS capsule Take 1 capsule (0.4 mg total) by mouth daily. 90 capsule 2  . traZODone (DESYREL) 150 MG tablet Take 1 tablet (150  mg total) by mouth at bedtime. 90 tablet 2   No facility-administered medications prior to visit.   .med Allergies:   Motrin [ibuprofen] and Other   Social History   Tobacco Use  . Smoking status: Current Every Day Smoker    Packs/day: 1.50    Years: 37.00    Pack years: 55.50    Types: Cigarettes  . Smokeless tobacco: Never Used  Vaping Use  . Vaping Use: Former  Substance Use Topics  . Alcohol use: No    Comment: heavy drinker in past   . Drug use: No     Review of Systems  Constitutional: Negative.   HENT: Negative.   Eyes: Negative.   Respiratory: Positive for cough and wheezing.   Cardiovascular: Negative.   Genitourinary: Negative.   Skin: Negative.      Labs/Other Tests and Data Reviewed:    Recent Labs: 12/06/2019: ALT 13; BUN 10; Creatinine, Ser 0.99; Hemoglobin 15.5; Platelets 511; Potassium 4.0; Sodium 138  Vital signs reviewed Recent Lipid Panel Lab Results  Component Value Date/Time   CHOL 276 (H) 12/06/2019 09:10 AM   TRIG 159 (H) 12/06/2019 09:10 AM   HDL 54 12/06/2019 09:10 AM   CHOLHDL 5.1 (H) 12/06/2019 09:10 AM   LDLCALC 193 (H) 12/06/2019 09:10 AM    Wt Readings from Last 3 Encounters:  12/18/19 287 lb (130.2 kg)  12/06/19 279 lb 12.8 oz (126.9 kg)  10/19/19 285 lb 12.8 oz (129.6 kg)     Objective:    Vital Signs:  BP 139/74   Pulse 87   Temp 98.3 F (36.8 C)   Ht 5\' 8"  (1.727 m)   Wt 287 lb (130.2 kg)   BMI 43.64 kg/m    Physical Exam   ASSESSMENT & PLAN:   COPD with excerebration: Patient is  having increased dyspnea and wheezing.  Keep on sprays.  Start levaquin and prednisone pack. i f not doing well call. We discussed smoking cessation.  No orders of the defined types were placed in this encounter.    Meds ordered this encounter  Medications  . levofloxacin (LEVAQUIN) 500 MG tablet    Sig: Take 1 tablet (500 mg total) by mouth daily.    Dispense:  7 tablet    Refill:  0  . predniSONE (STERAPRED UNI-PAK 21 TAB) 10 MG (21) TBPK tablet    Sig: Take 6ills first day , then 5 pills day 2 and then cut down one pill day until gone    Dispense:  21 tablet    Refill:  0    COVID-19 Education: The signs and symptoms of COVID-19 were discussed with the patient and how to seek care for testing (follow up with PCP or arrange E-visit). The importance of social distancing was discussed today.  Time:   Today, I have spent 20 minutes with the patient with telehealth technology discussing the above problems.    Follow Up:  In Person prn  Signed, Reinaldo Meeker, MD  12/18/2019 2:01 PM    [place]

## 2019-12-19 ENCOUNTER — Other Ambulatory Visit: Payer: Self-pay

## 2019-12-19 DIAGNOSIS — E1142 Type 2 diabetes mellitus with diabetic polyneuropathy: Secondary | ICD-10-CM

## 2019-12-19 MED ORDER — LANTUS SOLOSTAR 100 UNIT/ML ~~LOC~~ SOPN: 30.0000 [IU] | PEN_INJECTOR | Freq: Every day | SUBCUTANEOUS | 0 refills | Status: DC

## 2019-12-19 MED ORDER — PEN NEEDLES 31G X 8 MM MISC: 1.0000 | Freq: Every day | 3 refills | Status: DC

## 2019-12-25 ENCOUNTER — Ambulatory Visit: Payer: Self-pay | Admitting: Podiatry

## 2019-12-30 DIAGNOSIS — Z7982 Long term (current) use of aspirin: Secondary | ICD-10-CM | POA: Diagnosis not present

## 2019-12-30 DIAGNOSIS — I10 Essential (primary) hypertension: Secondary | ICD-10-CM | POA: Diagnosis not present

## 2019-12-30 DIAGNOSIS — J449 Chronic obstructive pulmonary disease, unspecified: Secondary | ICD-10-CM | POA: Diagnosis not present

## 2019-12-30 DIAGNOSIS — Z6841 Body Mass Index (BMI) 40.0 and over, adult: Secondary | ICD-10-CM | POA: Diagnosis not present

## 2019-12-30 DIAGNOSIS — E669 Obesity, unspecified: Secondary | ICD-10-CM | POA: Diagnosis not present

## 2019-12-30 DIAGNOSIS — E785 Hyperlipidemia, unspecified: Secondary | ICD-10-CM | POA: Diagnosis not present

## 2019-12-30 DIAGNOSIS — Z794 Long term (current) use of insulin: Secondary | ICD-10-CM | POA: Diagnosis not present

## 2019-12-30 DIAGNOSIS — R000 Tachycardia, unspecified: Secondary | ICD-10-CM | POA: Diagnosis not present

## 2019-12-30 DIAGNOSIS — Z96653 Presence of artificial knee joint, bilateral: Secondary | ICD-10-CM | POA: Diagnosis not present

## 2019-12-30 DIAGNOSIS — R52 Pain, unspecified: Secondary | ICD-10-CM | POA: Diagnosis not present

## 2019-12-30 DIAGNOSIS — M25522 Pain in left elbow: Secondary | ICD-10-CM | POA: Diagnosis not present

## 2019-12-30 DIAGNOSIS — M25422 Effusion, left elbow: Secondary | ICD-10-CM | POA: Diagnosis not present

## 2019-12-30 DIAGNOSIS — Z20822 Contact with and (suspected) exposure to covid-19: Secondary | ICD-10-CM | POA: Diagnosis not present

## 2019-12-30 DIAGNOSIS — B999 Unspecified infectious disease: Secondary | ICD-10-CM | POA: Diagnosis not present

## 2019-12-30 DIAGNOSIS — M00822 Arthritis due to other bacteria, left elbow: Secondary | ICD-10-CM | POA: Diagnosis not present

## 2019-12-30 DIAGNOSIS — M1990 Unspecified osteoarthritis, unspecified site: Secondary | ICD-10-CM | POA: Diagnosis not present

## 2019-12-30 DIAGNOSIS — F17210 Nicotine dependence, cigarettes, uncomplicated: Secondary | ICD-10-CM | POA: Diagnosis not present

## 2019-12-30 DIAGNOSIS — M25521 Pain in right elbow: Secondary | ICD-10-CM | POA: Diagnosis not present

## 2019-12-30 DIAGNOSIS — M10022 Idiopathic gout, left elbow: Secondary | ICD-10-CM | POA: Diagnosis not present

## 2019-12-30 DIAGNOSIS — M009 Pyogenic arthritis, unspecified: Secondary | ICD-10-CM | POA: Diagnosis not present

## 2019-12-30 DIAGNOSIS — E119 Type 2 diabetes mellitus without complications: Secondary | ICD-10-CM | POA: Diagnosis not present

## 2019-12-30 DIAGNOSIS — I252 Old myocardial infarction: Secondary | ICD-10-CM | POA: Diagnosis not present

## 2019-12-30 DIAGNOSIS — I2510 Atherosclerotic heart disease of native coronary artery without angina pectoris: Secondary | ICD-10-CM | POA: Diagnosis not present

## 2019-12-30 DIAGNOSIS — M7989 Other specified soft tissue disorders: Secondary | ICD-10-CM | POA: Diagnosis not present

## 2020-01-04 DIAGNOSIS — M25561 Pain in right knee: Secondary | ICD-10-CM | POA: Diagnosis not present

## 2020-01-04 DIAGNOSIS — M25562 Pain in left knee: Secondary | ICD-10-CM | POA: Diagnosis not present

## 2020-01-04 DIAGNOSIS — Z79899 Other long term (current) drug therapy: Secondary | ICD-10-CM | POA: Diagnosis not present

## 2020-01-04 DIAGNOSIS — Z96653 Presence of artificial knee joint, bilateral: Secondary | ICD-10-CM | POA: Diagnosis not present

## 2020-01-04 DIAGNOSIS — G8928 Other chronic postprocedural pain: Secondary | ICD-10-CM | POA: Diagnosis not present

## 2020-01-09 ENCOUNTER — Telehealth: Payer: Self-pay

## 2020-01-09 NOTE — Telephone Encounter (Signed)
Pt left a message requesting an appointment on 01/08/2020, however our office was closed. I call the pt back but no answer. I left a message asking the pt to call back to schedule an appointment.

## 2020-01-11 ENCOUNTER — Ambulatory Visit: Payer: Self-pay | Admitting: Podiatry

## 2020-01-12 DIAGNOSIS — Z7951 Long term (current) use of inhaled steroids: Secondary | ICD-10-CM | POA: Diagnosis not present

## 2020-01-12 DIAGNOSIS — G4733 Obstructive sleep apnea (adult) (pediatric): Secondary | ICD-10-CM | POA: Diagnosis not present

## 2020-01-12 DIAGNOSIS — I517 Cardiomegaly: Secondary | ICD-10-CM | POA: Diagnosis not present

## 2020-01-12 DIAGNOSIS — R0600 Dyspnea, unspecified: Secondary | ICD-10-CM | POA: Diagnosis not present

## 2020-01-12 DIAGNOSIS — K219 Gastro-esophageal reflux disease without esophagitis: Secondary | ICD-10-CM | POA: Diagnosis not present

## 2020-01-12 DIAGNOSIS — R002 Palpitations: Secondary | ICD-10-CM | POA: Diagnosis not present

## 2020-01-12 DIAGNOSIS — R0682 Tachypnea, not elsewhere classified: Secondary | ICD-10-CM | POA: Diagnosis not present

## 2020-01-12 DIAGNOSIS — R05 Cough: Secondary | ICD-10-CM | POA: Diagnosis not present

## 2020-01-12 DIAGNOSIS — I5020 Unspecified systolic (congestive) heart failure: Secondary | ICD-10-CM | POA: Diagnosis not present

## 2020-01-12 DIAGNOSIS — J440 Chronic obstructive pulmonary disease with acute lower respiratory infection: Secondary | ICD-10-CM | POA: Diagnosis not present

## 2020-01-12 DIAGNOSIS — I110 Hypertensive heart disease with heart failure: Secondary | ICD-10-CM | POA: Diagnosis not present

## 2020-01-12 DIAGNOSIS — F17210 Nicotine dependence, cigarettes, uncomplicated: Secondary | ICD-10-CM | POA: Diagnosis not present

## 2020-01-12 DIAGNOSIS — E785 Hyperlipidemia, unspecified: Secondary | ICD-10-CM | POA: Diagnosis not present

## 2020-01-12 DIAGNOSIS — G8929 Other chronic pain: Secondary | ICD-10-CM | POA: Diagnosis not present

## 2020-01-12 DIAGNOSIS — I252 Old myocardial infarction: Secondary | ICD-10-CM | POA: Diagnosis not present

## 2020-01-12 DIAGNOSIS — M109 Gout, unspecified: Secondary | ICD-10-CM | POA: Diagnosis not present

## 2020-01-12 DIAGNOSIS — E119 Type 2 diabetes mellitus without complications: Secondary | ICD-10-CM | POA: Diagnosis not present

## 2020-01-12 DIAGNOSIS — G4730 Sleep apnea, unspecified: Secondary | ICD-10-CM | POA: Diagnosis not present

## 2020-01-12 DIAGNOSIS — Z7982 Long term (current) use of aspirin: Secondary | ICD-10-CM | POA: Diagnosis not present

## 2020-01-12 DIAGNOSIS — I2510 Atherosclerotic heart disease of native coronary artery without angina pectoris: Secondary | ICD-10-CM | POA: Diagnosis not present

## 2020-01-12 DIAGNOSIS — I10 Essential (primary) hypertension: Secondary | ICD-10-CM | POA: Diagnosis not present

## 2020-01-12 DIAGNOSIS — J189 Pneumonia, unspecified organism: Secondary | ICD-10-CM | POA: Diagnosis not present

## 2020-01-12 DIAGNOSIS — J811 Chronic pulmonary edema: Secondary | ICD-10-CM | POA: Diagnosis not present

## 2020-01-12 DIAGNOSIS — I5021 Acute systolic (congestive) heart failure: Secondary | ICD-10-CM | POA: Diagnosis not present

## 2020-01-12 DIAGNOSIS — Z20822 Contact with and (suspected) exposure to covid-19: Secondary | ICD-10-CM | POA: Diagnosis not present

## 2020-01-12 DIAGNOSIS — Z791 Long term (current) use of non-steroidal anti-inflammatories (NSAID): Secondary | ICD-10-CM | POA: Diagnosis not present

## 2020-01-12 DIAGNOSIS — Z96653 Presence of artificial knee joint, bilateral: Secondary | ICD-10-CM | POA: Diagnosis not present

## 2020-01-12 DIAGNOSIS — R0602 Shortness of breath: Secondary | ICD-10-CM | POA: Diagnosis not present

## 2020-01-12 DIAGNOSIS — Z794 Long term (current) use of insulin: Secondary | ICD-10-CM | POA: Diagnosis not present

## 2020-01-15 ENCOUNTER — Telehealth: Payer: Self-pay

## 2020-01-15 ENCOUNTER — Ambulatory Visit: Payer: Self-pay | Admitting: Legal Medicine

## 2020-01-15 NOTE — Telephone Encounter (Signed)
°  Transition Care Management Follow-up Telephone Call  Beck Cofer [place] May 06, 1961  Admit Date: 01/12/20 Discharge Date: 01/14/20 Diagnoses: CHF, PNA   2 day post discharge: 01/16/20 7 day post discharge: 01/21/20 14 day post discharge: 01/28/20  Koy Lamp Parzych was discharged from [facility] on 01/14/20 with the diagnoses listed above.  He was contacted today via telephone in regards to transition of care.  I did not reach him on the phone and left a detailed message to return my call to schedule a Hospital Follow-up appointment with Dr [name].  Mr Buhl presented to the ED with complaints of cough and SHOB.  He was found to be in fluid overload with elevated BNP and mild treponemia.  EF 35-40%.  Treatment includes: Augmentin (7/10-7/14) and Azithromycin (7/9-7/12), prednisone 40mg  (7/9-7/14), Lasix 20mg  IV x 1, continued on 20mg  PO with dry weight 270lbs. Increased ACEI to 20mg  Lisinopril and added 25mg  metoprolol 25mg   -Goal Weight 270 (discharge weight was 270) Patient instructed to call with 3# weight gain in 24 hours OR 5# weight gain in one week   Discharge Instructions: Daily Weights and BP checks, follow-up with Cardiology  Items Reviewed:  Medications reviewed: yes  Allergies reviewed: yes  Dietary changes reviewed: yes  Referrals reviewed: yes  [facility] within 2 weeks    Shelle Iron, LPN 48/59/27 63:94 AM

## 2020-01-22 ENCOUNTER — Encounter: Payer: Self-pay | Admitting: Legal Medicine

## 2020-01-22 ENCOUNTER — Other Ambulatory Visit: Payer: Self-pay

## 2020-01-22 ENCOUNTER — Ambulatory Visit (INDEPENDENT_AMBULATORY_CARE_PROVIDER_SITE_OTHER): Payer: Medicare Other | Admitting: Legal Medicine

## 2020-01-22 VITALS — BP 134/80 | HR 85 | Temp 97.0°F | Resp 18 | Wt 274.0 lb

## 2020-01-22 DIAGNOSIS — I5023 Acute on chronic systolic (congestive) heart failure: Secondary | ICD-10-CM

## 2020-01-22 DIAGNOSIS — F17200 Nicotine dependence, unspecified, uncomplicated: Secondary | ICD-10-CM

## 2020-01-22 DIAGNOSIS — J189 Pneumonia, unspecified organism: Secondary | ICD-10-CM | POA: Diagnosis not present

## 2020-01-22 DIAGNOSIS — E1142 Type 2 diabetes mellitus with diabetic polyneuropathy: Secondary | ICD-10-CM

## 2020-01-22 DIAGNOSIS — E782 Mixed hyperlipidemia: Secondary | ICD-10-CM | POA: Diagnosis not present

## 2020-01-22 DIAGNOSIS — I5041 Acute combined systolic (congestive) and diastolic (congestive) heart failure: Secondary | ICD-10-CM

## 2020-01-22 HISTORY — DX: Acute combined systolic (congestive) and diastolic (congestive) heart failure: I50.41

## 2020-01-22 MED ORDER — NICOTINE 21 MG/24HR TD PT24: 21.0000 mg | MEDICATED_PATCH | Freq: Every day | TRANSDERMAL | 1 refills | Status: DC

## 2020-01-22 NOTE — Progress Notes (Signed)
Subjective:  Patient ID: Brent Ortiz, male    DOB: [birth year]  Age: 59 y.o. MRN: [id number]  Chief Complaint  Patient presents with  . Pneumonia  . Congestive Heart Failure    HPITransition of care.  Patient admitted 01/12/2020 in [place] for pneumonia treated with IV antibiotics and IV lasix.  His EF is low .35- 40 %.  He was discharged on 01/14/2020.  No further SOB  Patient present with type 2 diabetes.  Specifically, this is type 2, insulin requiring diabetes, complicated by neuropathy.  Compliance with treatment has been good; patient take medicines as directed, maintains diet and exercise regimen, follows up as directed, and is keeping glucose diary.  Date of  diagnosis 2010.  Depression screen has been performed.Tobacco screen nonsmoker. Current medicines for diabetes lantus.30units and metformin  Patient is on lisinopril for renal protection and atorvastatin for cholesterol control.  Patient performs foot exams daily and last ophthalmologic exam was needs eye check.  All hospital records and scans reviewed Current Outpatient Medications on File Prior to Visit  Medication Sig Dispense Refill  . metoprolol succinate (TOPROL-XL) 25 MG 24 hr tablet Take by mouth.    Marland Kitchen acetaminophen (TYLENOL) 500 MG tablet Take 500 mg by mouth every 6 (six) hours as needed for mild pain.     . Albuterol Sulfate (PROAIR RESPICLICK) 702 (90 Base) MCG/ACT AEPB Inhale 2 puffs into the lungs every 4 (four) hours as needed (shortness of breath).     Marland Kitchen aspirin 81 MG tablet Take 81 mg by mouth daily.    Marland Kitchen atorvastatin (LIPITOR) 40 MG tablet Take 40 mg by mouth daily.    Marland Kitchen azithromycin (ZITHROMAX) 500 MG tablet Take by mouth.    . budesonide-formoterol (SYMBICORT) 80-4.5 MCG/ACT inhaler Inhale 2 puffs into the lungs 2 (two) times daily. 3 Inhaler 2  . colchicine 0.6 MG tablet Take by mouth.    . colchicine-probenecid 0.5-500 MG tablet Take 1 tablet by mouth 2 (two) times daily as needed (GOUT). 180 tablet 2    . diclofenac Sodium (VOLTAREN) 1 % GEL Apply 4 g topically 4 (four) times daily. 150 g 2  . furosemide (LASIX) 20 MG tablet Take by mouth.    . gabapentin (NEURONTIN) 600 MG tablet Take 1 tablet (600 mg total) by mouth every morning. 90 tablet 2  . Insulin Pen Needle (PEN NEEDLES) 31G X 8 MM MISC 1 each by Does not apply route daily. 100 each 3  . LANTUS SOLOSTAR 100 UNIT/ML Solostar Pen Inject 30 Units into the skin daily. 15 mL 0  . levofloxacin (LEVAQUIN) 500 MG tablet Take 1 tablet (500 mg total) by mouth daily. 7 tablet 0  . lisinopril (ZESTRIL) 10 MG tablet Take 1 tablet (10 mg total) by mouth every morning. 90 tablet 2  . meloxicam (MOBIC) 15 MG tablet Take 1 tablet (15 mg total) by mouth daily. 90 tablet 2  . metFORMIN (GLUCOPHAGE) 500 MG tablet Take 1 tablet (500 mg total) by mouth daily with supper. 180 tablet 2  . nitroGLYCERIN (NITROSTAT) 0.4 MG SL tablet Place 1 tablet (0.4 mg total) under the tongue every 5 (five) minutes as needed. For chest pain 50 tablet 3  . oxyCODONE-acetaminophen (PERCOCET) 10-325 MG tablet TAKE 1 TABLET BY MOUTH  EVERY 6 HOURS AS NEEDED FOR PAIN  0  . pantoprazole (PROTONIX) 40 MG tablet Take 1 tablet (40 mg total) by mouth daily. 90 tablet 2  . predniSONE (STERAPRED UNI-PAK 21 TAB) 10  MG (21) TBPK tablet Take 6ills first day , then 5 pills day 2 and then cut down one pill day until gone 21 tablet 0  . ranolazine (RANEXA) 500 MG 12 hr tablet Take 1 tablet (500 mg total) by mouth 2 (two) times daily. 180 tablet 2  . rosuvastatin (CRESTOR) 20 MG tablet Take 20 mg by mouth at bedtime.    . sertraline (ZOLOFT) 25 MG tablet Take 1 tablet (25 mg total) by mouth daily. 90 tablet 2  . sildenafil (REVATIO) 20 MG tablet Take 1 tablet (20 mg total) by mouth as directed. Take 3 to 5 20 minutes before intercourse 50 tablet 2  . tamsulosin (FLOMAX) 0.4 MG CAPS capsule Take 1 capsule (0.4 mg total) by mouth daily. 90 capsule 2  . traZODone (DESYREL) 150 MG tablet Take 1  tablet (150 mg total) by mouth at bedtime. 90 tablet 2   No current facility-administered medications on file prior to visit.   Past Medical History:  Diagnosis Date  . Coronary artery disease   . Gout   . HTN (hypertension)   . Leukocytosis 09/04/2011  . Myocardial infarction ([place])    04-13-2011  . Nicotine abuse 12/18/2019   we discussed smoking cessation  . Osteoarthritis   . Pneumonia   . Poor dentition   . S/P left knee revision 06/23/2011  . Streptococcus pneumoniae infection 09/29/2011   Past Surgical History:  Procedure Laterality Date  . HERNIA REPAIR  2009  . I & D KNEE WITH POLY EXCHANGE  09/03/2011   Procedure: IRRIGATION AND DEBRIDEMENT KNEE WITH POLY EXCHANGE;  Surgeon: Mauri Pole, MD;  Location: [facility];  Service: Orthopedics;  Laterality: Left;  . TOOTH EXTRACTION Bilateral 08/06/2017   Procedure: DENTAL RESTORATION/EXTRACTIONS;  Surgeon: Diona Browner, DDS;  Location: [facility];  Service: Oral Surgery;  Laterality: Bilateral;  . TOTAL KNEE ARTHROPLASTY   right  feb 2012   left march 2012 r  . TOTAL KNEE REVISION  06/23/2011   Procedure: TOTAL KNEE REVISION;  Surgeon: Mauri Pole;  [facility];  Service: Orthopedics;  Laterality: Left;  femomal nerve block done in holding area without incident    Family History  Problem Relation Age of Onset  . Colon cancer Other   . Colon cancer Mother    Social History   Socioeconomic History  . Marital status: Married    Spouse name: Not on file  . Number of children: 5  . Years of education: Not on file  . Highest education level: Not on file  Occupational History  . Occupation: CNA  Tobacco Use  . Smoking status: Current Every Day Smoker    Packs/day: 1.50    Years: 37.00    Pack years: 55.50    Types: Cigarettes  . Smokeless tobacco: Never Used  Vaping Use  . Vaping Use: Former  Substance and Sexual Activity  . Alcohol use: No    Comment: heavy drinker in past   . Drug use: No  . Sexual activity: Not  Currently  Other Topics Concern  . Not on file  Social History Narrative  . Not on file   Social Determinants of Health   Financial Resource Strain:   . Difficulty of Paying Living Expenses:   Food Insecurity:   . Worried About Charity fundraiser in the Last Year:   . Arboriculturist in the Last Year:   Transportation Needs:   . Film/video editor (Medical):   Marland Kitchen  Lack of Transportation (Non-Medical):   Physical Activity:   . Days of Exercise per Week:   . Minutes of Exercise per Session:   Stress:   . Feeling of Stress :   Social Connections:   . Frequency of Communication with Friends and Family:   . Frequency of Social Gatherings with Friends and Family:   . Attends Religious Services:   . Active Member of Clubs or Organizations:   . Attends Archivist Meetings:   Marland Kitchen Marital Status:     Review of Systems  Constitutional: Negative.   HENT: Negative.   Eyes: Negative.   Respiratory: Negative.   Cardiovascular: Negative.   Gastrointestinal: Negative.   Endocrine: Negative.   Genitourinary: Negative.   Musculoskeletal: Negative.   Skin: Negative.   Neurological: Negative.   Psychiatric/Behavioral: Negative.      Objective:  BP 134/80   Pulse 85   Temp (!) 97 F (36.1 C)   Resp 18   Wt 274 lb (124.3 kg)   SpO2 98%   BMI 41.66 kg/m   BP/Weight 01/22/2020 4/68/0321 08/08/4823  Systolic BP 003 704 888  Diastolic BP 80 74 80  Wt. (Lbs) 274 287 279.8  BMI 41.66 43.64 43.82    Physical Exam Vitals reviewed.  Constitutional:      Appearance: Normal appearance.  HENT:     Head: Normocephalic and atraumatic.     Right Ear: Tympanic membrane, ear canal and external ear normal.     Left Ear: Tympanic membrane, ear canal and external ear normal.     Nose: Nose normal.     Mouth/Throat:     Mouth: Mucous membranes are moist.     Pharynx: Oropharynx is clear.  Eyes:     Extraocular Movements: Extraocular movements intact.     Conjunctiva/sclera:  Conjunctivae normal.     Pupils: Pupils are equal, round, and reactive to light.  Cardiovascular:     Rate and Rhythm: Normal rate and regular rhythm.     Pulses: Normal pulses.     Heart sounds: Normal heart sounds.  Pulmonary:     Effort: Pulmonary effort is normal.     Breath sounds: Normal breath sounds.  Abdominal:     General: Abdomen is flat. Bowel sounds are normal.     Palpations: Abdomen is soft.  Musculoskeletal:        General: Normal range of motion.     Cervical back: Normal range of motion.  Skin:    General: Skin is warm.  Neurological:     General: No focal deficit present.     Mental Status: He is alert and oriented to person, place, and time.  Psychiatric:        Mood and Affect: Mood normal.        Behavior: Behavior normal.        Thought Content: Thought content normal.        Judgment: Judgment normal.     Diabetic Foot Exam - Simple   No data filed       Lab Results  Component Value Date   WBC 15.0 (H) 12/06/2019   HGB 15.5 12/06/2019   HCT 44.3 12/06/2019   PLT 511 (H) 12/06/2019   GLUCOSE 131 (H) 12/06/2019   CHOL 276 (H) 12/06/2019   TRIG 159 (H) 12/06/2019   HDL 54 12/06/2019   LDLCALC 193 (H) 12/06/2019   ALT 13 12/06/2019   AST 19 12/06/2019   NA 138 12/06/2019   K  4.0 12/06/2019   CL 97 12/06/2019   CREATININE 0.99 12/06/2019   BUN 10 12/06/2019   CO2 23 12/06/2019   INR 1.81 (H) 09/07/2011   HGBA1C 6.7 (H) 12/06/2019      Assessment & Plan:   1. Pneumonia due to infectious organism, unspecified laterality, unspecified part of lung - CBC with Differential/Platelet AN INDIVIDUAL CARE PLAN for pneumonia was established and reinforced today.  The patient's status was assessed using clinical findings on exam, labs, and other diagnostic testing. Patient's success at meeting treatment goals based on disease specific evidence-bassed guidelines and found to be in fair control. RECOMMENDATIONS include maintaining present medicines  and treatment.  2. Diabetic polyneuropathy associated with type 2 diabetes mellitus ([place]) - Ambulatory referral to Podiatry - Comprehensive metabolic panel An individual care plan for diabetes was established and reinforced today.  The patient's status was assessed using clinical findings on exam, labs and diagnostic testing. Patient success at meeting goals based on disease specific evidence-based guidelines and found to be good controlled. Medications were assessed and patient's understanding of the medical issues , including barriers were assessed. Recommend adherence to a diabetic diet, a graduated exercise program, HgbA1c level is checked quarterly, and urine microalbumin performed yearly .  Annual mono-filament sensation testing performed. Lower blood pressure and control hyperlipidemia is important. Get annual eye exams and annual flu shots and smoking cessation discussed.  Self management goals were discussed.  3. Mixed hyperlipidemia AN INDIVIDUAL CARE PLAN for hyperlipidemia/ cholesterol was established and reinforced today.  The patient's status was assessed using clinical findings on exam, lab and other diagnostic tests. The patient's disease status was assessed based on evidence-based guidelines and found to be well controlled. MEDICATIONS were reviewed. SELF MANAGEMENT GOALS have been discussed and patient's success at attaining the goal of low cholesterol was assessed. RECOMMENDATION given include regular exercise 3 days a week and low cholesterol/low fat diet. CLINICAL SUMMARY including written plan to identify barriers unique to the patient due to social or economic  reasons was discussed.  4. Tobacco dependence Individual plan was given to patient based on exam, history and other tests and using evidence based criteria for care for smoking cessation.  We discussed behavioral changes to help cessation and offered medicines( patches) to aid in quitting.  Medical consequences of  tobacco use were explained.  5. Acute on chronic systolic congestive heart failure (HCC) - Brain natriuretic peptide An individualized care plan was established and reinforced.  The patient's disease status was assessed using clinical finding son exam today, labs, and/or other diagnostic testing such as x-rays, to determine the patient's success in meeting treatmentgoalsbased on disease-based guidelines and found to beimproving. But not at goal yet. Medications prescriptions no change Laboratory tests ordered to be performed today include  RECOMMENDATIONS: given include see cardiology.  Call physician is patient gains 3 lbs in one day or 5 lbs for one week.  Call for progressive PND, orthopnea or increased pedal edema.     Meds ordered this encounter  Medications  . nicotine (NICODERM CQ - DOSED IN MG/24 HOURS) 21 mg/24hr patch    Sig: Place 1 patch (21 mg total) onto the skin daily.    Dispense:  28 patch    Refill:  1    Orders Placed This Encounter  Procedures  . CBC with Differential/Platelet  . Comprehensive metabolic panel  . Brain natriuretic peptide  . Ambulatory referral to Podiatry     Follow-up: No follow-ups on file.  An  After Visit Summary was printed and given to the patient.  [place] [phone]

## 2020-01-23 LAB — COMPREHENSIVE METABOLIC PANEL
ALT: 8 IU/L (ref 0–44)
AST: 13 IU/L (ref 0–40)
Albumin/Globulin Ratio: 1.6 (ref 1.2–2.2)
Albumin: 4.1 g/dL (ref 3.8–4.8)
Alkaline Phosphatase: 67 IU/L (ref 48–121)
BUN/Creatinine Ratio: 12 (ref 10–24)
BUN: 15 mg/dL (ref 8–27)
Bilirubin Total: 0.7 mg/dL (ref 0.0–1.2)
CO2: 25 mmol/L (ref 20–29)
Calcium: 9.4 mg/dL (ref 8.6–10.2)
Chloride: 101 mmol/L (ref 96–106)
Creatinine, Ser: 1.21 mg/dL (ref 0.76–1.27)
GFR calc Af Amer: 74 mL/min/{1.73_m2} (ref >59)
GFR calc non Af Amer: 64 mL/min/{1.73_m2} (ref >59)
Globulin, Total: 2.6 g/dL (ref 1.5–4.5)
Glucose: 81 mg/dL (ref 65–99)
Potassium: 4.1 mmol/L (ref 3.5–5.2)
Sodium: 139 mmol/L (ref 134–144)
Total Protein: 6.7 g/dL (ref 6.0–8.5)

## 2020-01-23 LAB — CBC WITH DIFFERENTIAL/PLATELET
Basophils Absolute: 0 10*3/uL (ref 0.0–0.2)
Basos: 0 %
EOS (ABSOLUTE): 0.1 10*3/uL (ref 0.0–0.4)
Eos: 1 %
Hematocrit: 39.7 % (ref 37.5–51.0)
Hemoglobin: 13.1 g/dL (ref 13.0–17.7)
Immature Grans (Abs): 0 10*3/uL (ref 0.0–0.1)
Immature Granulocytes: 0 %
Lymphocytes Absolute: 2.9 10*3/uL (ref 0.7–3.1)
Lymphs: 28 %
MCH: 30.2 pg (ref 26.6–33.0)
MCHC: 33 g/dL (ref 31.5–35.7)
MCV: 92 fL (ref 79–97)
Monocytes Absolute: 0.6 10*3/uL (ref 0.1–0.9)
Monocytes: 6 %
Neutrophils Absolute: 6.8 10*3/uL (ref 1.4–7.0)
Neutrophils: 65 %
Platelets: 558 10*3/uL — ABNORMAL HIGH (ref 150–450)
RBC: 4.34 x10E6/uL (ref 4.14–5.80)
RDW: 13 % (ref 11.6–15.4)
WBC: 10.4 10*3/uL (ref 3.4–10.8)

## 2020-01-23 LAB — BRAIN NATRIURETIC PEPTIDE: BNP: 81.6 pg/mL (ref 0.0–100.0)

## 2020-01-23 NOTE — Progress Notes (Signed)
Platelets high, kidney and liver tests normal, BNP normal no heart failure lp

## 2020-01-31 ENCOUNTER — Other Ambulatory Visit: Payer: Self-pay | Admitting: Legal Medicine

## 2020-01-31 DIAGNOSIS — E1142 Type 2 diabetes mellitus with diabetic polyneuropathy: Secondary | ICD-10-CM

## 2020-02-01 ENCOUNTER — Ambulatory Visit: Payer: Self-pay | Admitting: Podiatry

## 2020-02-05 DIAGNOSIS — Z79899 Other long term (current) drug therapy: Secondary | ICD-10-CM | POA: Diagnosis not present

## 2020-02-05 DIAGNOSIS — M25562 Pain in left knee: Secondary | ICD-10-CM | POA: Diagnosis not present

## 2020-02-05 DIAGNOSIS — M25561 Pain in right knee: Secondary | ICD-10-CM | POA: Diagnosis not present

## 2020-02-05 DIAGNOSIS — G8929 Other chronic pain: Secondary | ICD-10-CM | POA: Diagnosis not present

## 2020-02-08 ENCOUNTER — Other Ambulatory Visit: Payer: Self-pay

## 2020-02-08 ENCOUNTER — Encounter: Payer: Self-pay | Admitting: *Deleted

## 2020-02-08 ENCOUNTER — Ambulatory Visit (INDEPENDENT_AMBULATORY_CARE_PROVIDER_SITE_OTHER): Payer: Medicare Other | Admitting: Cardiology

## 2020-02-08 ENCOUNTER — Telehealth: Payer: Self-pay | Admitting: Cardiology

## 2020-02-08 VITALS — BP 150/84 | Ht 68.0 in | Wt 271.0 lb

## 2020-02-08 DIAGNOSIS — E782 Mixed hyperlipidemia: Secondary | ICD-10-CM

## 2020-02-08 DIAGNOSIS — R079 Chest pain, unspecified: Secondary | ICD-10-CM

## 2020-02-08 DIAGNOSIS — I2510 Atherosclerotic heart disease of native coronary artery without angina pectoris: Secondary | ICD-10-CM

## 2020-02-08 DIAGNOSIS — I5020 Unspecified systolic (congestive) heart failure: Secondary | ICD-10-CM

## 2020-02-08 DIAGNOSIS — I10 Essential (primary) hypertension: Secondary | ICD-10-CM | POA: Diagnosis not present

## 2020-02-08 DIAGNOSIS — Z6841 Body Mass Index (BMI) 40.0 and over, adult: Secondary | ICD-10-CM

## 2020-02-08 DIAGNOSIS — G4733 Obstructive sleep apnea (adult) (pediatric): Secondary | ICD-10-CM

## 2020-02-08 DIAGNOSIS — F17200 Nicotine dependence, unspecified, uncomplicated: Secondary | ICD-10-CM

## 2020-02-08 HISTORY — DX: Unspecified systolic (congestive) heart failure: I50.20

## 2020-02-08 HISTORY — DX: Atherosclerotic heart disease of native coronary artery without angina pectoris: I25.10

## 2020-02-08 LAB — BASIC METABOLIC PANEL
BUN/Creatinine Ratio: 9 — ABNORMAL LOW (ref 10–24)
BUN: 11 mg/dL (ref 8–27)
CO2: 26 mmol/L (ref 20–29)
Calcium: 8.9 mg/dL (ref 8.6–10.2)
Chloride: 95 mmol/L — ABNORMAL LOW (ref 96–106)
Creatinine, Ser: 1.28 mg/dL — ABNORMAL HIGH (ref 0.76–1.27)
GFR calc Af Amer: 69 mL/min/{1.73_m2} (ref >59)
GFR calc non Af Amer: 60 mL/min/{1.73_m2} (ref >59)
Glucose: 76 mg/dL (ref 65–99)
Potassium: 3.1 mmol/L — ABNORMAL LOW (ref 3.5–5.2)
Sodium: 137 mmol/L (ref 134–144)

## 2020-02-08 LAB — MAGNESIUM: Magnesium: 1.6 mg/dL (ref 1.6–2.3)

## 2020-02-08 MED ORDER — SACUBITRIL-VALSARTAN 24-26 MG PO TABS: 1.0000 | ORAL_TABLET | Freq: Two times a day (BID) | ORAL | 11 refills | Status: DC

## 2020-02-08 MED ORDER — SPIRONOLACTONE 25 MG PO TABS
12.5000 mg | ORAL_TABLET | Freq: Every day | ORAL | 3 refills | Status: DC
Start: 2020-02-08 — End: 2020-07-11

## 2020-02-08 NOTE — Telephone Encounter (Signed)
Spoke with the patient and the patients daughter and let them know that they could buy a pill cutter to help them to cut these pills in half. I let them know that these pill cutters should be able to be bought at any pharmacy or even [facility] for just a couple of dollars. They verbalize understanding and thank me for the call back.

## 2020-02-08 NOTE — Telephone Encounter (Signed)
Pt c/o medication issue:  1. Name of Medication: spironolactone (ALDACTONE) 25 MG tablet  2. How are you currently taking this medication (dosage and times per day)? Has not started  3. Are you having a reaction (difficulty breathing--STAT)? no  4. What is your medication issue? Patient's daughter states the patient is to take half a tablet, but the pill is very small. She states she does not know how they will be able to cut it in half since it is so small. Please advise.

## 2020-02-08 NOTE — Progress Notes (Signed)
Cardiology Office Note:    Date:  02/08/2020   ID:  Brent Ortiz, DOB 1960/11/26, MRN [id number]  PCP:  [name], MD  Cardiologist:  No primary care provider on file.  Electrophysiologist:  None   Referring MD: [name],*   Chief Complaint  Patient presents with  . Congestive Heart Failure    History of Present Illness:    Brent Ortiz is a 59 y.o. male with a hx of recent hospitalization noted heart failure with reduced ejection fraction EF 40 to 45%, ischemic cardiomyopathy, coronary artery disease cardiac catheterization in 2017, hypertension, diabetes and hyperlipidemia presents today to establish cardiac care.  The patient was referred by his PCP Dr.  Henrene Ortiz.  Of note he was recently in the hospital [facility] in [place] where he was treated for pneumonia.  The patient tells me today that since his hospitalization he has had some intermittent chest pain.  He reported that this is mostly on exertion when he do different activities.  He tells me that he is short of breath at baseline but the chest pain is new.  He described it as a midsternal sharp and sometimes dull sensation that lasts for few minutes prior to resolution.  Nothing makes it better or worse.  He has nitroglycerin but he has not tried this medication.    Past Medical History:  Diagnosis Date  . Acute combined systolic and diastolic CHF, NYHA class 2 ([place]) 01/22/2020  . Alcohol abuse   . Chronic pain 10/19/2019   Sees pain clinic  . Coronary artery disease   . Coronary atherosclerosis of native coronary artery 01/23/2016   Overview:  Completely occluded circumflex artery proximal to obtuse marginal 1 basin cardiac catheter from 2017  . Diabetic polyneuropathy ([place]) 12/06/2019  . ED (erectile dysfunction) 12/06/2019  . Essential hypertension 01/23/2016  . Gout   . HTN (hypertension)   . Leukocytosis 09/04/2011  . Mixed hyperlipidemia 06/18/2017  . Morbid obesity with BMI of 40.0-44.9,  adult ([place]) 01/13/2013  . Myocardial infarction ([place])    04-13-2011  . Nicotine abuse 12/18/2019   we discussed smoking cessation  . Obstructive chronic bronchitis with exacerbation ([place]) 10/03/2019  . Opiate abuse, continuous ([place])   . Osteoarthritis   . Pain in knee joint 02/12/2012   Formatting of this note might be different from the original. Left  . Pneumonia   . Pneumonia due to infectious organism 09/04/2011  . Polysubstance abuse ([place])   . Poor dentition   . S/P left knee revision 06/23/2011  . Streptococcus pneumoniae infection 09/29/2011  . Thrombocytosis ([place]) 09/29/2011  . Tobacco dependence 01/23/2016  . Tobacco use disorder 01/23/2016    Past Surgical History:  Procedure Laterality Date  . HERNIA REPAIR  2009  . I & D KNEE WITH POLY EXCHANGE  09/03/2011   Procedure: IRRIGATION AND DEBRIDEMENT KNEE WITH POLY EXCHANGE;  Surgeon: Mauri Pole, MD;  Location: [facility];  Service: Orthopedics;  Laterality: Left;  . TOOTH EXTRACTION Bilateral 08/06/2017   Procedure: DENTAL RESTORATION/EXTRACTIONS;  Surgeon: Diona Browner, DDS;  Location: [facility];  Service: Oral Surgery;  Laterality: Bilateral;  . TOTAL KNEE ARTHROPLASTY   right  feb 2012   left march 2012 r  . TOTAL KNEE REVISION  06/23/2011   Procedure: TOTAL KNEE REVISION;  Surgeon: Mauri Pole;  Location: [facility];  Service: Orthopedics;  Laterality: Left;  femomal nerve block done in holding area without incident    Current Medications:  Current Meds  Medication Sig  . acetaminophen (TYLENOL) 500 MG tablet Take 500 mg by mouth every 6 (six) hours as needed for mild pain.   . Albuterol Sulfate (PROAIR RESPICLICK) 086 (90 Base) MCG/ACT AEPB Inhale 2 puffs into the lungs every 4 (four) hours as needed (shortness of breath).   Marland Kitchen aspirin 81 MG tablet Take 81 mg by mouth daily.  Marland Kitchen atorvastatin (LIPITOR) 40 MG tablet Take 40 mg by mouth daily.  . budesonide-formoterol (SYMBICORT) 80-4.5 MCG/ACT inhaler Inhale 2 puffs into the lungs 2  (two) times daily.  . colchicine 0.6 MG tablet Take by mouth.  . colchicine-probenecid 0.5-500 MG tablet Take 1 tablet by mouth 2 (two) times daily as needed (GOUT).  . furosemide (LASIX) 20 MG tablet Take by mouth.  . gabapentin (NEURONTIN) 600 MG tablet Take 1 tablet (600 mg total) by mouth every morning.  . indomethacin (INDOCIN) 25 MG capsule Take 25 mg by mouth 3 (three) times daily.  . Insulin Pen Needle (PEN NEEDLES) 31G X 8 MM MISC 1 each by Does not apply route daily.  Marland Kitchen LANTUS SOLOSTAR 100 UNIT/ML Solostar Pen ADMINISTER 30 UNITS UNDER THE SKIN DAILY  . levofloxacin (LEVAQUIN) 500 MG tablet Take 1 tablet (500 mg total) by mouth daily.  . meloxicam (MOBIC) 15 MG tablet Take 1 tablet (15 mg total) by mouth daily.  . metFORMIN (GLUCOPHAGE) 500 MG tablet Take 1 tablet (500 mg total) by mouth daily with supper.  . metoprolol succinate (TOPROL-XL) 25 MG 24 hr tablet Take by mouth.  . nitroGLYCERIN (NITROSTAT) 0.4 MG SL tablet Place 1 tablet (0.4 mg total) under the tongue every 5 (five) minutes as needed. For chest pain  . oxyCODONE-acetaminophen (PERCOCET) 10-325 MG tablet TAKE 1 TABLET BY MOUTH  EVERY 6 HOURS AS NEEDED FOR PAIN  . pantoprazole (PROTONIX) 40 MG tablet Take 1 tablet (40 mg total) by mouth daily.  . sertraline (ZOLOFT) 25 MG tablet Take 1 tablet (25 mg total) by mouth daily.  . tamsulosin (FLOMAX) 0.4 MG CAPS capsule Take 1 capsule (0.4 mg total) by mouth daily.  . traZODone (DESYREL) 150 MG tablet Take 1 tablet (150 mg total) by mouth at bedtime.  . [DISCONTINUED] lisinopril (ZESTRIL) 10 MG tablet Take 1 tablet (10 mg total) by mouth every morning.     Allergies:   Motrin [ibuprofen] and Other   Social History   Socioeconomic History  . Marital status: Married    Spouse name: Not on file  . Number of children: 5  . Years of education: Not on file  . Highest education level: Not on file  Occupational History  . Occupation: CNA  Tobacco Use  . Smoking status:  Current Every Day Smoker    Packs/day: 1.50    Years: 37.00    Pack years: 55.50    Types: Cigarettes  . Smokeless tobacco: Never Used  Vaping Use  . Vaping Use: Former  Substance and Sexual Activity  . Alcohol use: No    Comment: heavy drinker in past   . Drug use: No  . Sexual activity: Not Currently  Other Topics Concern  . Not on file  Social History Narrative  . Not on file   Social Determinants of Health   Financial Resource Strain:   . Difficulty of Paying Living Expenses:   Food Insecurity:   . Worried About Charity fundraiser in the Last Year:   . [place] in the Last Year:  Transportation Needs:   . Film/video editor (Medical):   Marland Kitchen Lack of Transportation (Non-Medical):   Physical Activity:   . Days of Exercise per Week:   . Minutes of Exercise per Session:   Stress:   . Feeling of Stress :   Social Connections:   . Frequency of Communication with Friends and Family:   . Frequency of Social Gatherings with Friends and Family:   . Attends Religious Services:   . Active Member of Clubs or Organizations:   . Attends Archivist Meetings:   Marland Kitchen Marital Status:      Family History: The patient's family history includes Colon cancer in his mother and another family member.  ROS:   Review of Systems  Constitution: Negative for decreased appetite, fever and weight gain.  HENT: Negative for congestion, ear discharge, hoarse voice and sore throat.   Eyes: Negative for discharge, redness, vision loss in right eye and visual halos.  Cardiovascular: Negative for chest pain, dyspnea on exertion, leg swelling, orthopnea and palpitations.  Respiratory: Negative for cough, hemoptysis, shortness of breath and snoring.   Endocrine: Negative for heat intolerance and polyphagia.  Hematologic/Lymphatic: Negative for bleeding problem. Does not bruise/bleed easily.  Skin: Negative for flushing, nail changes, rash and suspicious lesions.  Musculoskeletal:  Negative for arthritis, joint pain, muscle cramps, myalgias, neck pain and stiffness.  Gastrointestinal: Negative for abdominal pain, bowel incontinence, diarrhea and excessive appetite.  Genitourinary: Negative for decreased libido, genital sores and incomplete emptying.  Neurological: Negative for brief paralysis, focal weakness, headaches and loss of balance.  Psychiatric/Behavioral: Negative for altered mental status, depression and suicidal ideas.  Allergic/Immunologic: Negative for HIV exposure and persistent infections.    EKGs/Labs/Other Studies Reviewed:    The following studies were reviewed today:   EKG:  The ekg ordered today demonstrates sinus rhythm, heart rate 78 bpm.  Echocardiogram impressionSummary  1. The left ventricle is upper normal in size with moderately increased wall  thickness.  2. The left ventricular systolic function is moderately decreased, LVEF is  visually estimated at 40-45%.  3. There is grade II diastolic dysfunction (elevated filling pressure).  4. The right ventricle is normal in size, with normal systolic function.  5. The aorta at the sinuses of Valsalva and sinotubular junction is mildly  dilated.    Recent Labs: 01/22/2020: ALT 8; BNP 81.6; BUN 15; Creatinine, Ser 1.21; Hemoglobin 13.1; Platelets 558; Potassium 4.1; Sodium 139  Recent Lipid Panel    Component Value Date/Time   CHOL 276 (H) 12/06/2019 0910   TRIG 159 (H) 12/06/2019 0910   HDL 54 12/06/2019 0910   CHOLHDL 5.1 (H) 12/06/2019 0910   LDLCALC 193 (H) 12/06/2019 0910    Physical Exam:    VS:  BP (!) 150/84 (BP Location: Left Arm, Patient Position: Sitting, Cuff Size: Large)   Ht 5\' 8"  (1.727 m)   Wt 271 lb (122.9 kg)   SpO2 94%   BMI 41.21 kg/m     Wt Readings from Last 3 Encounters:  02/08/20 271 lb (122.9 kg)  01/22/20 274 lb (124.3 kg)  12/18/19 287 lb (130.2 kg)     GEN: Well nourished, well developed in no acute distress HEENT: Normal NECK: No  JVD; No carotid bruits LYMPHATICS: No lymphadenopathy CARDIAC: S1S2 noted,RRR, no murmurs, rubs, gallops RESPIRATORY:  Clear to auscultation without rales, wheezing or rhonchi  ABDOMEN: Soft, non-tender, non-distended, +bowel sounds, no guarding. EXTREMITIES: No edema, No cyanosis, no clubbing MUSCULOSKELETAL:  No deformity  SKIN: Warm and dry NEUROLOGIC:  Alert and oriented x 3, non-focal PSYCHIATRIC:  Normal affect, good insight  ASSESSMENT:    1. HFrEF (heart failure with reduced ejection fraction) (HCC)   2. Chest pain, unspecified type   3. Essential hypertension   4. Mixed hyperlipidemia   5. Morbid obesity with BMI of 40.0-44.9, adult ([place])   6. Coronary artery disease involving native heart, angina presence unspecified, unspecified vessel or lesion type   7. Tobacco use disorder    PLAN:     He was hospitalized recently for acute exacerbation of heart failure he was treated and discharged home.  His echocardiogram done at the hospital [facility] showed an EF of 40 to 45%.  He is currently on lisinopril, Toprol-XL.  At this time I like to stop the lisinopril.  He has taken lisinopril today.  He will not take any tomorrow, Saturday and Sunday.  Monday morning he will start his Entresto 2426 mg twice daily.  I explained to him the reason why we need to make sure we have these 3-day in between to limit any type of adverse reaction.  I am also going to start the patient on Aldactone 12.5 mg daily this will help his blood pressure as well as initiate his goal-directed medical therapy as well.  Continue patient on his Toprol-XL.  Along with his Lasix 20 mg daily.  In terms of his chest pain he has risk factors for coronary artery disease he does have nitroglycerin sublingual at home which she has not tried have encouraged the patient to do so.  In the meantime based on his risk factors were going to have the patient undergo pharmacologic nuclear stress test.  I have educated him about  this test and he is agreeable to proceed.  He is hypertensive in the office today.  I am hoping that the changes with his medication will help bring him to target of less than 130/80 mmHg.  He has obstructive sleep apnea and has had problems with his machine over the last 6 months.  He notes that he talk to his PCP who is working to get him a new machine.   Morbidly obese-the patient understands the need to lose weight with diet and exercise. We have discussed specific strategies for this.  Hyperlipidemia his recent lipid profile showed LDL of 193, with his history of coronary artery disease his target needs to be less than 70.  He restarted his cholesterol medication he is now on atorvastatin 40 mg daily.  I will repeat this in about 8 weeks and if this target is still above 70 the plan will be to increase to 80 mg daily as well as add Zetia 10 mg daily to his regimen.  He may be a PCSK9 inhibitor candidate at the time of his lipid profile with LP(a) will also be done.  His total cholesterol is also elevated at 276  Diabetes mellitus per his primary provider.  Patient is also on insulin and Metformin.  Blood work will be done today which includes BMP and magnesium.  Tobacco use smoking cessation advised.  The patient is in agreement with the above plan. The patient left the office in stable condition.  The patient will follow up in 2 weeks due to medication changes   Medication Adjustments/Labs and Tests Ordered: Current medicines are reviewed at length with the patient today.  Concerns regarding medicines are outlined above.  Orders Placed This Encounter  Procedures  . Basic  metabolic panel  . Magnesium  . MYOCARDIAL PERFUSION IMAGING  . EKG 12-Lead   Meds ordered this encounter  Medications  . sacubitril-valsartan (ENTRESTO) 24-26 MG    Sig: Take 1 tablet by mouth 2 (two) times daily.    Dispense:  60 tablet    Refill:  11  . spironolactone (ALDACTONE) 25 MG tablet    Sig: Take  0.5 tablets (12.5 mg total) by mouth daily.    Dispense:  45 tablet    Refill:  3    Patient Instructions  Medication Instructions:  1. STOP LISINOPRIL TODAY (02/08/20), NO LISINOPRIL Friday (02/09/20), Saturday (02/10/20), OR Sunday (02/11/20).  2. START ENTRESTO 24-26 MG TWICE DAILY ON Monday (02/12/20)  3. START ALDACTONE 12.5 MG DAILY.  *If you need a refill on your cardiac medications before your next appointment, please call your pharmacy*   Lab Work: TODAY: BMET, [place]   If you have labs (blood work) drawn today and your tests are completely normal, you will receive your results only by: Marland Kitchen MyChart Message (if you have MyChart) OR . A paper copy in the mail If you have any lab test that is abnormal or we need to change your treatment, we will call you to review the results.   Testing/Procedures: Your physician has requested that you have a lexiscan myoview. For further information please visit [email or web address]. Please follow instruction sheet, as given.   Follow-Up: At [facility], you and your health needs are our priority.  As part of our continuing mission to provide you with exceptional heart care, we have created designated Provider Care Teams.  These Care Teams include your primary Cardiologist (physician) and Advanced Practice Providers (APPs -  Physician Assistants and Nurse Practitioners) who all work together to provide you with the care you need, when you need it.  We recommend signing up for the patient portal called "MyChart".  Sign up information is provided on this After Visit Summary.  MyChart is used to connect with patients for Virtual Visits (Telemedicine).  Patients are able to view lab/test results, encounter notes, upcoming appointments, etc.  Non-urgent messages can be sent to your provider as well.   To learn more about what you can do with MyChart, go to [email or web address].    Your next appointment:   2 week(s)  The format for your next  appointment:   In Person  Provider:   Berniece Salines, DO       Adopting a Healthy Lifestyle.  Know what a healthy weight is for you (roughly BMI <25) and aim to maintain this   Aim for 7+ servings of fruits and vegetables daily   65-80+ fluid ounces of water or unsweet tea for healthy kidneys   Limit to max 1 drink of alcohol per day; avoid smoking/tobacco   Limit animal fats in diet for cholesterol and heart health - choose grass fed whenever available   Avoid highly processed foods, and foods high in saturated/trans fats   Aim for low stress - take time to unwind and care for your mental health   Aim for 150 min of moderate intensity exercise weekly for heart health, and weights twice weekly for bone health   Aim for 7-9 hours of sleep daily   When it comes to diets, agreement about the perfect plan isnt easy to find, even among the experts. Experts at the [place] developed an idea known as the Healthy Eating Plate. Just imagine a plate divided into logical,  healthy portions.   The emphasis is on diet quality:   Load up on vegetables and fruits - one-half of your plate: Aim for color and variety, and remember that potatoes dont count.   Go for whole grains - one-quarter of your plate: Whole wheat, barley, wheat berries, quinoa, oats, brown rice, and foods made with them. If you want pasta, go with whole wheat pasta.   Protein power - one-quarter of your plate: Fish, chicken, beans, and nuts are all healthy, versatile protein sources. Limit red meat.   The diet, however, does go beyond the plate, offering a few other suggestions.   Use healthy plant oils, such as olive, canola, soy, corn, sunflower and peanut. Check the labels, and avoid partially hydrogenated oil, which have unhealthy trans fats.   If youre thirsty, drink water. Coffee and tea are good in moderation, but skip sugary drinks and limit milk and dairy products to one or two daily  servings.   The type of carbohydrate in the diet is more important than the amount. Some sources of carbohydrates, such as vegetables, fruits, whole grains, and beans-are healthier than others.   Finally, stay active  Signed, Berniece Salines, DO  02/08/2020 10:14 AM    [place]

## 2020-02-08 NOTE — Patient Instructions (Signed)
Medication Instructions:  1. STOP LISINOPRIL TODAY (02/08/20), NO LISINOPRIL Friday (02/09/20), Saturday (02/10/20), OR Sunday (02/11/20).  2. START ENTRESTO 24-26 MG TWICE DAILY ON Monday (02/12/20)  3. START ALDACTONE 12.5 MG DAILY.  *If you need a refill on your cardiac medications before your next appointment, please call your pharmacy*   Lab Work: TODAY: BMET, [place]   If you have labs (blood work) drawn today and your tests are completely normal, you will receive your results only by: Marland Kitchen MyChart Message (if you have MyChart) OR . A paper copy in the mail If you have any lab test that is abnormal or we need to change your treatment, we will call you to review the results.   Testing/Procedures: Your physician has requested that you have a lexiscan myoview. For further information please visit [email or web address]. Please follow instruction sheet, as given.   Follow-Up: At [facility], you and your health needs are our priority.  As part of our continuing mission to provide you with exceptional heart care, we have created designated Provider Care Teams.  These Care Teams include your primary Cardiologist (physician) and Advanced Practice Providers (APPs -  Physician Assistants and Nurse Practitioners) who all work together to provide you with the care you need, when you need it.  We recommend signing up for the patient portal called "MyChart".  Sign up information is provided on this After Visit Summary.  MyChart is used to connect with patients for Virtual Visits (Telemedicine).  Patients are able to view lab/test results, encounter notes, upcoming appointments, etc.  Non-urgent messages can be sent to your provider as well.   To learn more about what you can do with MyChart, go to [email or web address].    Your next appointment:   2 week(s)  The format for your next appointment:   In Person  Provider:   Berniece Salines, DO

## 2020-02-09 ENCOUNTER — Telehealth: Payer: Self-pay

## 2020-02-09 DIAGNOSIS — I10 Essential (primary) hypertension: Secondary | ICD-10-CM

## 2020-02-09 NOTE — Telephone Encounter (Signed)
Spoke with patient regarding results and recommendation.  Patient verbalizes understanding and is agreeable to plan of care. Advised patient to call back with any issues or concerns.  

## 2020-02-09 NOTE — Telephone Encounter (Signed)
-----   Message from Berniece Salines, DO sent at 02/09/2020  8:14 AM EDT ----- Your creatinine is slightly elevated, potassium is 3.12 weeks ago it was 4.1.  This is due to you taking Lasix.  I started you on spironolactone which will help with balancing your potassium.  Please come in 2 weeks to get blood work.

## 2020-02-12 DIAGNOSIS — I252 Old myocardial infarction: Secondary | ICD-10-CM | POA: Diagnosis not present

## 2020-02-12 DIAGNOSIS — Z79899 Other long term (current) drug therapy: Secondary | ICD-10-CM | POA: Diagnosis not present

## 2020-02-12 DIAGNOSIS — M109 Gout, unspecified: Secondary | ICD-10-CM | POA: Diagnosis not present

## 2020-02-12 DIAGNOSIS — J449 Chronic obstructive pulmonary disease, unspecified: Secondary | ICD-10-CM | POA: Diagnosis not present

## 2020-02-12 DIAGNOSIS — I4510 Unspecified right bundle-branch block: Secondary | ICD-10-CM | POA: Diagnosis not present

## 2020-02-12 DIAGNOSIS — E119 Type 2 diabetes mellitus without complications: Secondary | ICD-10-CM | POA: Diagnosis not present

## 2020-02-12 DIAGNOSIS — Z96653 Presence of artificial knee joint, bilateral: Secondary | ICD-10-CM | POA: Diagnosis not present

## 2020-02-12 DIAGNOSIS — I700 Atherosclerosis of aorta: Secondary | ICD-10-CM | POA: Diagnosis not present

## 2020-02-12 DIAGNOSIS — R21 Rash and other nonspecific skin eruption: Secondary | ICD-10-CM | POA: Diagnosis not present

## 2020-02-12 DIAGNOSIS — Z7289 Other problems related to lifestyle: Secondary | ICD-10-CM | POA: Diagnosis not present

## 2020-02-12 DIAGNOSIS — E876 Hypokalemia: Secondary | ICD-10-CM | POA: Diagnosis not present

## 2020-02-12 DIAGNOSIS — Z9114 Patient's other noncompliance with medication regimen: Secondary | ICD-10-CM | POA: Diagnosis not present

## 2020-02-12 DIAGNOSIS — F17210 Nicotine dependence, cigarettes, uncomplicated: Secondary | ICD-10-CM | POA: Diagnosis not present

## 2020-02-12 DIAGNOSIS — Z794 Long term (current) use of insulin: Secondary | ICD-10-CM | POA: Diagnosis not present

## 2020-02-12 DIAGNOSIS — E785 Hyperlipidemia, unspecified: Secondary | ICD-10-CM | POA: Diagnosis not present

## 2020-02-12 DIAGNOSIS — I110 Hypertensive heart disease with heart failure: Secondary | ICD-10-CM | POA: Diagnosis not present

## 2020-02-12 DIAGNOSIS — R079 Chest pain, unspecified: Secondary | ICD-10-CM | POA: Diagnosis not present

## 2020-02-12 DIAGNOSIS — Z7982 Long term (current) use of aspirin: Secondary | ICD-10-CM | POA: Diagnosis not present

## 2020-02-12 DIAGNOSIS — I5022 Chronic systolic (congestive) heart failure: Secondary | ICD-10-CM | POA: Diagnosis not present

## 2020-02-12 DIAGNOSIS — M25521 Pain in right elbow: Secondary | ICD-10-CM | POA: Diagnosis not present

## 2020-02-12 DIAGNOSIS — Z20822 Contact with and (suspected) exposure to covid-19: Secondary | ICD-10-CM | POA: Diagnosis not present

## 2020-02-12 DIAGNOSIS — I2510 Atherosclerotic heart disease of native coronary artery without angina pectoris: Secondary | ICD-10-CM | POA: Diagnosis not present

## 2020-02-27 ENCOUNTER — Ambulatory Visit: Payer: Medicare Other | Admitting: Cardiology

## 2020-02-28 ENCOUNTER — Telehealth (HOSPITAL_COMMUNITY): Payer: Self-pay | Admitting: *Deleted

## 2020-02-28 DIAGNOSIS — G4733 Obstructive sleep apnea (adult) (pediatric): Secondary | ICD-10-CM | POA: Diagnosis not present

## 2020-02-28 NOTE — Telephone Encounter (Signed)
Patient gave permission to speak to daughter. Daughter was given detailed instructions per Myocardial Perfusion Study Information Sheet for the test on 03/05/2020 at 1100. Patient notified to arrive 15 minutes early and that it is imperative to arrive on time for appointment to keep from having the test rescheduled.  If you need to cancel or reschedule your appointment, please call the office within 24 hours of your appointment. . Patient verbalized understanding.Dorethea Strubel, [place] No mychart

## 2020-03-04 ENCOUNTER — Telehealth: Payer: Self-pay | Admitting: Cardiology

## 2020-03-04 NOTE — Telephone Encounter (Signed)
Patient calling to reschedule his two day stress test, because he is sick.

## 2020-03-05 ENCOUNTER — Ambulatory Visit: Payer: Medicare Other

## 2020-03-06 ENCOUNTER — Ambulatory Visit: Payer: Medicare Other

## 2020-03-14 ENCOUNTER — Telehealth (HOSPITAL_COMMUNITY): Payer: Self-pay | Admitting: *Deleted

## 2020-03-14 ENCOUNTER — Ambulatory Visit: Payer: Medicare Other | Admitting: Cardiology

## 2020-03-14 NOTE — Telephone Encounter (Signed)
Patient given detailed instructions per Myocardial Perfusion Study Information Sheet for the test on 03/21/20 at 7:45. Patient notified to arrive 15 minutes early and that it is imperative to arrive on time for appointment to keep from having the test rescheduled.  If you need to cancel or reschedule your appointment, please call the office within 24 hours of your appointment. . Patient verbalized understanding.Brent Ortiz

## 2020-03-19 ENCOUNTER — Ambulatory Visit: Payer: Medicare Other

## 2020-03-19 DIAGNOSIS — G8929 Other chronic pain: Secondary | ICD-10-CM | POA: Diagnosis not present

## 2020-03-19 DIAGNOSIS — Z79899 Other long term (current) drug therapy: Secondary | ICD-10-CM | POA: Diagnosis not present

## 2020-03-19 DIAGNOSIS — M25562 Pain in left knee: Secondary | ICD-10-CM | POA: Diagnosis not present

## 2020-03-19 DIAGNOSIS — M25561 Pain in right knee: Secondary | ICD-10-CM | POA: Diagnosis not present

## 2020-03-20 ENCOUNTER — Telehealth: Payer: Self-pay | Admitting: *Deleted

## 2020-03-20 NOTE — Telephone Encounter (Signed)
Patient attempted several times 03/19/20 and today to schedule his part 2 of nuclear stress test. Left 3 messages and patient has not returned our call. Called patient daughter that is listed on DPR and she has been unable to contact his as well. Asked daughter to please let patient know we need to talk with him asap.

## 2020-03-21 ENCOUNTER — Ambulatory Visit: Payer: Medicare Other

## 2020-03-30 ENCOUNTER — Other Ambulatory Visit: Payer: Self-pay | Admitting: Legal Medicine

## 2020-03-30 DIAGNOSIS — E1140 Type 2 diabetes mellitus with diabetic neuropathy, unspecified: Secondary | ICD-10-CM

## 2020-04-17 DIAGNOSIS — I110 Hypertensive heart disease with heart failure: Secondary | ICD-10-CM | POA: Diagnosis not present

## 2020-04-17 DIAGNOSIS — M25532 Pain in left wrist: Secondary | ICD-10-CM | POA: Diagnosis not present

## 2020-04-17 DIAGNOSIS — I509 Heart failure, unspecified: Secondary | ICD-10-CM | POA: Diagnosis not present

## 2020-04-17 DIAGNOSIS — Z79899 Other long term (current) drug therapy: Secondary | ICD-10-CM | POA: Diagnosis not present

## 2020-04-17 DIAGNOSIS — Z96653 Presence of artificial knee joint, bilateral: Secondary | ICD-10-CM | POA: Diagnosis not present

## 2020-04-17 DIAGNOSIS — I252 Old myocardial infarction: Secondary | ICD-10-CM | POA: Diagnosis not present

## 2020-04-17 DIAGNOSIS — Z7982 Long term (current) use of aspirin: Secondary | ICD-10-CM | POA: Diagnosis not present

## 2020-04-17 DIAGNOSIS — Z794 Long term (current) use of insulin: Secondary | ICD-10-CM | POA: Diagnosis not present

## 2020-04-17 DIAGNOSIS — J449 Chronic obstructive pulmonary disease, unspecified: Secondary | ICD-10-CM | POA: Diagnosis not present

## 2020-04-17 DIAGNOSIS — E876 Hypokalemia: Secondary | ICD-10-CM | POA: Diagnosis not present

## 2020-04-17 DIAGNOSIS — E785 Hyperlipidemia, unspecified: Secondary | ICD-10-CM | POA: Diagnosis not present

## 2020-04-17 DIAGNOSIS — Z7984 Long term (current) use of oral hypoglycemic drugs: Secondary | ICD-10-CM | POA: Diagnosis not present

## 2020-04-17 DIAGNOSIS — M109 Gout, unspecified: Secondary | ICD-10-CM | POA: Diagnosis not present

## 2020-04-17 DIAGNOSIS — M25522 Pain in left elbow: Secondary | ICD-10-CM | POA: Diagnosis not present

## 2020-04-17 DIAGNOSIS — I2510 Atherosclerotic heart disease of native coronary artery without angina pectoris: Secondary | ICD-10-CM | POA: Diagnosis not present

## 2020-04-17 DIAGNOSIS — F17210 Nicotine dependence, cigarettes, uncomplicated: Secondary | ICD-10-CM | POA: Diagnosis not present

## 2020-04-17 DIAGNOSIS — E119 Type 2 diabetes mellitus without complications: Secondary | ICD-10-CM | POA: Diagnosis not present

## 2020-04-18 DIAGNOSIS — M25572 Pain in left ankle and joints of left foot: Secondary | ICD-10-CM | POA: Diagnosis not present

## 2020-04-18 DIAGNOSIS — I2510 Atherosclerotic heart disease of native coronary artery without angina pectoris: Secondary | ICD-10-CM | POA: Diagnosis not present

## 2020-04-18 DIAGNOSIS — M25562 Pain in left knee: Secondary | ICD-10-CM | POA: Diagnosis not present

## 2020-04-18 DIAGNOSIS — E119 Type 2 diabetes mellitus without complications: Secondary | ICD-10-CM | POA: Diagnosis not present

## 2020-04-18 DIAGNOSIS — J449 Chronic obstructive pulmonary disease, unspecified: Secondary | ICD-10-CM | POA: Diagnosis not present

## 2020-04-18 DIAGNOSIS — B351 Tinea unguium: Secondary | ICD-10-CM | POA: Diagnosis not present

## 2020-04-18 DIAGNOSIS — Z7982 Long term (current) use of aspirin: Secondary | ICD-10-CM | POA: Diagnosis not present

## 2020-04-18 DIAGNOSIS — I10 Essential (primary) hypertension: Secondary | ICD-10-CM | POA: Diagnosis not present

## 2020-04-18 DIAGNOSIS — M25561 Pain in right knee: Secondary | ICD-10-CM | POA: Diagnosis not present

## 2020-04-18 DIAGNOSIS — F17210 Nicotine dependence, cigarettes, uncomplicated: Secondary | ICD-10-CM | POA: Diagnosis not present

## 2020-04-18 DIAGNOSIS — Z96653 Presence of artificial knee joint, bilateral: Secondary | ICD-10-CM | POA: Diagnosis not present

## 2020-04-18 DIAGNOSIS — M130 Polyarthritis, unspecified: Secondary | ICD-10-CM | POA: Diagnosis not present

## 2020-04-18 DIAGNOSIS — Z7984 Long term (current) use of oral hypoglycemic drugs: Secondary | ICD-10-CM | POA: Diagnosis not present

## 2020-04-18 DIAGNOSIS — I252 Old myocardial infarction: Secondary | ICD-10-CM | POA: Diagnosis not present

## 2020-04-18 DIAGNOSIS — M25571 Pain in right ankle and joints of right foot: Secondary | ICD-10-CM | POA: Diagnosis not present

## 2020-04-18 DIAGNOSIS — Z79899 Other long term (current) drug therapy: Secondary | ICD-10-CM | POA: Diagnosis not present

## 2020-04-18 DIAGNOSIS — M109 Gout, unspecified: Secondary | ICD-10-CM | POA: Diagnosis not present

## 2020-04-18 DIAGNOSIS — M25522 Pain in left elbow: Secondary | ICD-10-CM | POA: Diagnosis not present

## 2020-04-18 DIAGNOSIS — E785 Hyperlipidemia, unspecified: Secondary | ICD-10-CM | POA: Diagnosis not present

## 2020-04-24 ENCOUNTER — Encounter: Payer: Self-pay | Admitting: Legal Medicine

## 2020-04-24 ENCOUNTER — Other Ambulatory Visit: Payer: Self-pay

## 2020-04-24 ENCOUNTER — Ambulatory Visit (INDEPENDENT_AMBULATORY_CARE_PROVIDER_SITE_OTHER): Payer: Medicare Other | Admitting: Legal Medicine

## 2020-04-24 VITALS — BP 150/98 | HR 113 | Temp 97.3°F | Resp 17 | Ht 68.0 in | Wt 267.0 lb

## 2020-04-24 DIAGNOSIS — I5020 Unspecified systolic (congestive) heart failure: Secondary | ICD-10-CM

## 2020-04-24 DIAGNOSIS — E1142 Type 2 diabetes mellitus with diabetic polyneuropathy: Secondary | ICD-10-CM

## 2020-04-24 DIAGNOSIS — F17200 Nicotine dependence, unspecified, uncomplicated: Secondary | ICD-10-CM

## 2020-04-24 DIAGNOSIS — E782 Mixed hyperlipidemia: Secondary | ICD-10-CM

## 2020-04-24 DIAGNOSIS — I10 Essential (primary) hypertension: Secondary | ICD-10-CM | POA: Diagnosis not present

## 2020-04-24 DIAGNOSIS — G894 Chronic pain syndrome: Secondary | ICD-10-CM | POA: Diagnosis not present

## 2020-04-24 DIAGNOSIS — G4733 Obstructive sleep apnea (adult) (pediatric): Secondary | ICD-10-CM

## 2020-04-24 MED ORDER — OXYCODONE-ACETAMINOPHEN 10-325 MG PO TABS: 1.0000 | ORAL_TABLET | Freq: Three times a day (TID) | ORAL | 0 refills | Status: AC | PRN

## 2020-04-24 NOTE — Progress Notes (Signed)
Subjective:  Patient ID: Brent Ortiz, male    DOB: May 21, 1961  Age: 59 y.o. MRN: [id number]  Chief Complaint  Patient presents with  . Gout  patient was unable to afford his home in [place] and since has moved in with a friend in [place].  HPI: patient is having a gout flair last week, he went to eD x 2.he is on hydrocodone, colchicine and indomethacin. left elbow , right wrist and right ankle. He denies any drinking.  Patient present with type 2 diabetes.  Specifically, this is type 2, insulin requiring diabetes, complicated by polyneuropathy.  Compliance with treatment has been good; patient take medicines as directed, maintains diet and exercise regimen, follows up as directed, and is keeping glucose diary.  Date of  diagnosis 2010.  Depression screen has been performed.Tobacco screen smoker. Current medicines for diabetes lantus solistar, metformin, .  Patient is on nothng for renal protection and atorvatatin for cholesterol control.  Patient performs foot exams daily and last ophthalmologic exam was no  Patient presents with HFrEF  that is stable. Diagnosis made 2019.  The course of the disease is stable.  Current medicines include entresto, spironolactone. Patient follows a low cholesterol diet and maintains a weight diary.  Patient is on low salt, low cholesterol diet and avoids alcohol.  Patient denies adverse effects of medicines. Patient is monitoring weight and has lost  of weight.  Patient is having no pedal edema, no PND and no PND.  Patient is continuing to see cardiology..   Current Outpatient Medications on File Prior to Visit  Medication Sig Dispense Refill  . acetaminophen (TYLENOL) 500 MG tablet Take 500 mg by mouth every 6 (six) hours as needed for mild pain.     . Albuterol Sulfate (PROAIR RESPICLICK) 542 (90 Base) MCG/ACT AEPB Inhale 2 puffs into the lungs every 4 (four) hours as needed (shortness of breath).     Marland Kitchen aspirin 81 MG tablet Take 81 mg by mouth daily.    Marland Kitchen  atorvastatin (LIPITOR) 40 MG tablet Take 40 mg by mouth daily.    . budesonide-formoterol (SYMBICORT) 80-4.5 MCG/ACT inhaler Inhale 2 puffs into the lungs 2 (two) times daily. 3 Inhaler 2  . colchicine 0.6 MG tablet Take by mouth.    . colchicine-probenecid 0.5-500 MG tablet Take 1 tablet by mouth 2 (two) times daily as needed (GOUT). 180 tablet 2  . gabapentin (NEURONTIN) 600 MG tablet TAKE 1 TABLET(600 MG) BY MOUTH EVERY MORNING 90 tablet 2  . indomethacin (INDOCIN) 25 MG capsule Take 25 mg by mouth 3 (three) times daily.    . Insulin Pen Needle (PEN NEEDLES) 31G X 8 MM MISC 1 each by Does not apply route daily. 100 each 3  . LANTUS SOLOSTAR 100 UNIT/ML Solostar Pen ADMINISTER 30 UNITS UNDER THE SKIN DAILY 15 mL 5  . levofloxacin (LEVAQUIN) 500 MG tablet Take 1 tablet (500 mg total) by mouth daily. 7 tablet 0  . meloxicam (MOBIC) 15 MG tablet Take 1 tablet (15 mg total) by mouth daily. 90 tablet 2  . metFORMIN (GLUCOPHAGE) 500 MG tablet Take 1 tablet (500 mg total) by mouth daily with supper. 180 tablet 2  . nitroGLYCERIN (NITROSTAT) 0.4 MG SL tablet Place 1 tablet (0.4 mg total) under the tongue every 5 (five) minutes as needed. For chest pain 50 tablet 3  . pantoprazole (PROTONIX) 40 MG tablet Take 1 tablet (40 mg total) by mouth daily. 90 tablet 2  . potassium chloride  SA (KLOR-CON) 20 MEQ tablet Take 20 mEq by mouth 4 (four) times daily.    . sacubitril-valsartan (ENTRESTO) 24-26 MG Take 1 tablet by mouth 2 (two) times daily. 60 tablet 11  . sertraline (ZOLOFT) 25 MG tablet Take 1 tablet (25 mg total) by mouth daily. 90 tablet 2  . spironolactone (ALDACTONE) 25 MG tablet Take 0.5 tablets (12.5 mg total) by mouth daily. 45 tablet 3  . tamsulosin (FLOMAX) 0.4 MG CAPS capsule Take 1 capsule (0.4 mg total) by mouth daily. 90 capsule 2  . traZODone (DESYREL) 150 MG tablet Take 1 tablet (150 mg total) by mouth at bedtime. 90 tablet 2   No current facility-administered medications on file prior to  visit.   Past Medical History:  Diagnosis Date  . Acute combined systolic and diastolic CHF, NYHA class 2 ([place]) 01/22/2020  . Alcohol abuse   . Chronic pain 10/19/2019   Sees pain clinic  . Coronary artery disease   . Coronary atherosclerosis of native coronary artery 01/23/2016   Overview:  Completely occluded circumflex artery proximal to obtuse marginal 1 basin cardiac catheter from 2017  . Diabetic polyneuropathy ([place]) 12/06/2019  . ED (erectile dysfunction) 12/06/2019  . Essential hypertension 01/23/2016  . HTN (hypertension)   . Leukocytosis 09/04/2011  . Mixed hyperlipidemia 06/18/2017  . Morbid obesity with BMI of 40.0-44.9, adult ([place]) 01/13/2013  . Myocardial infarction ([place])    04-13-2011  . Obstructive chronic bronchitis with exacerbation ([place]) 10/03/2019  . Opiate abuse, continuous ([place])   . Osteoarthritis   . Pain in knee joint 02/12/2012   Formatting of this note might be different from the original. Left  . Pneumonia   . Pneumonia due to infectious organism 09/04/2011  . Polysubstance abuse ([place])   . Poor dentition   . Thrombocytosis 09/29/2011  . Tobacco dependence 01/23/2016  . Tobacco use disorder 01/23/2016   Past Surgical History:  Procedure Laterality Date  . HERNIA REPAIR  2009  . I & D KNEE WITH POLY EXCHANGE  09/03/2011   Procedure: IRRIGATION AND DEBRIDEMENT KNEE WITH POLY EXCHANGE;  Surgeon: Mauri Pole, MD;  Location: [facility];  Service: Orthopedics;  Laterality: Left;  . TOOTH EXTRACTION Bilateral 08/06/2017   Procedure: DENTAL RESTORATION/EXTRACTIONS;  Surgeon: Diona Browner, DDS;  Location: [facility];  Service: Oral Surgery;  Laterality: Bilateral;  . TOTAL KNEE ARTHROPLASTY   right  feb 2012   left march 2012 r  . TOTAL KNEE REVISION  06/23/2011   Procedure: TOTAL KNEE REVISION;  Surgeon: Mauri Pole;  Location: [facility];  Service: Orthopedics;  Laterality: Left;  femomal nerve block done in holding area without incident    Family History  Problem Relation Age of  Onset  . Colon cancer Other   . Colon cancer Mother    Social History   Socioeconomic History  . Marital status: Married    Spouse name: Not on file  . Number of children: 5  . Years of education: Not on file  . Highest education level: Not on file  Occupational History  . Occupation: CNA  Tobacco Use  . Smoking status: Current Every Day Smoker    Packs/day: 0.50    Years: 37.00    Pack years: 18.50    Types: Cigarettes  . Smokeless tobacco: Never Used  Vaping Use  . Vaping Use: Former  Substance and Sexual Activity  . Alcohol use: No    Comment: heavy drinker in past   . Drug use: No  .  Sexual activity: Not Currently  Other Topics Concern  . Not on file  Social History Narrative  . Not on file   Social Determinants of Health   Financial Resource Strain:   . Difficulty of Paying Living Expenses: Not on file  Food Insecurity:   . Worried About Charity fundraiser in the Last Year: Not on file  . Ran Out of Food in the Last Year: Not on file  Transportation Needs:   . Lack of Transportation (Medical): Not on file  . Lack of Transportation (Non-Medical): Not on file  Physical Activity:   . Days of Exercise per Week: Not on file  . Minutes of Exercise per Session: Not on file  Stress:   . Feeling of Stress : Not on file  Social Connections:   . Frequency of Communication with Friends and Family: Not on file  . Frequency of Social Gatherings with Friends and Family: Not on file  . Attends Religious Services: Not on file  . Active Member of Clubs or Organizations: Not on file  . Attends Archivist Meetings: Not on file  . Marital Status: Not on file    Review of Systems  Constitutional: Negative.   HENT: Negative.   Respiratory: Negative for cough, shortness of breath and stridor.   Cardiovascular: Negative for chest pain, palpitations and leg swelling.  Gastrointestinal: Negative.   Genitourinary: Negative.   Musculoskeletal: Positive for joint  swelling (right wrist).  Skin: Negative.   Neurological: Positive for numbness (feet).  Psychiatric/Behavioral: Negative.      Objective:  BP (!) 150/98   Pulse (!) 113   Temp (!) 97.3 F (36.3 C)   Resp 17   Ht 5\' 8"  (1.727 m)   Wt 267 lb (121.1 kg)   SpO2 96%   BMI 40.60 kg/m   BP/Weight 04/24/2020 02/08/2020 5/73/2202  Systolic BP 542 706 237  Diastolic BP 98 84 80  Wt. (Lbs) 267 271 274  BMI 40.6 41.21 41.66    Physical Exam Vitals reviewed.  Constitutional:      Appearance: Normal appearance.     Comments: Patient just complains of pan, all joints are hurting him  HENT:     Head: Normocephalic.     Right Ear: Tympanic membrane, ear canal and external ear normal.     Left Ear: Tympanic membrane, ear canal and external ear normal.     Mouth/Throat:     Mouth: Mucous membranes are moist.     Pharynx: Oropharynx is clear.  Eyes:     Extraocular Movements: Extraocular movements intact.     Conjunctiva/sclera: Conjunctivae normal.     Pupils: Pupils are equal, round, and reactive to light.  Cardiovascular:     Rate and Rhythm: Normal rate and regular rhythm.     Pulses: Normal pulses.     Heart sounds: Normal heart sounds.  Pulmonary:     Effort: Pulmonary effort is normal.     Breath sounds: Normal breath sounds.  Abdominal:     General: Abdomen is flat. Bowel sounds are normal.     Palpations: Abdomen is soft.  Musculoskeletal:        General: Tenderness present.     Cervical back: Normal range of motion and neck supple.     Comments: Limited rom  Skin:    General: Skin is warm.     Capillary Refill: Capillary refill takes less than 2 seconds.  Neurological:     Mental Status: He is  alert.     Comments: Numbness feet  Psychiatric:     Comments: He is having narcotic withdrawal.  He needs to find new pain clinic in [place] to establish , [facility],  He is now out of pain pills.    Depression screen [facility] 2/9 04/24/2020  12/06/2019 09/29/2011  Decreased Interest 3 1 0  Down, Depressed, Hopeless 3 1 0  PHQ - 2 Score 6 2 0  Altered sleeping 2 1 -  Tired, decreased energy 3 1 -  Change in appetite 2 1 -  Feeling bad or failure about yourself  3 1 -  Trouble concentrating 3 2 -  Moving slowly or fidgety/restless 2 2 -  Suicidal thoughts 0 2 -  PHQ-9 Score 21 12 -  Difficult doing work/chores Very difficult Very difficult -     Diabetic Foot Exam - Simple   Simple Foot Form Diabetic Foot exam was performed with the following findings: Yes 04/24/2020  2:31 PM  Visual Inspection See comments: Yes Sensation Testing Pulse Check Posterior Tibialis and Dorsalis pulse intact bilaterally: Yes Comments Bunion, no monofialment sensation in feet      Lab Results  Component Value Date   WBC 10.4 01/22/2020   HGB 13.1 01/22/2020   HCT 39.7 01/22/2020   PLT 558 (H) 01/22/2020   GLUCOSE 76 02/08/2020   CHOL 276 (H) 12/06/2019   TRIG 159 (H) 12/06/2019   HDL 54 12/06/2019   LDLCALC 193 (H) 12/06/2019   ALT 8 01/22/2020   AST 13 01/22/2020   NA 137 02/08/2020   K 3.1 (L) 02/08/2020   CL 95 (L) 02/08/2020   CREATININE 1.28 (H) 02/08/2020   BUN 11 02/08/2020   CO2 26 02/08/2020   INR 1.81 (H) 09/07/2011   HGBA1C 6.7 (H) 12/06/2019      Assessment & Plan:   1. Chronic pain syndrome - Ambulatory referral to Pain Clinic - oxyCODONE-acetaminophen (PERCOCET) 10-325 MG tablet; Take 1 tablet by mouth every 8 (eight) hours as needed for up to 20 days for pain.  Dispense: 60 tablet; Refill: 0 Patient has a chronic pain syndrome and has been seen by [facility] pain clinic with last prescription in September.  Since he is moved out of his house and [place] and now living with a friend in [place] we will try to get him into the pain clinic associated with [facility].  I will give him 20 days worth of oxycodone only.  He understands this. 2. Mixed hyperlipidemia - Lipid panel AN INDIVIDUAL CARE PLAN for  hyperlipidemia/ cholesterol was established and reinforced today.  The patient's status was assessed using clinical findings on exam, lab and other diagnostic tests. The patient's disease status was assessed based on evidence-based guidelines and found to be well controlled. MEDICATIONS were reviewed. SELF MANAGEMENT GOALS have been discussed and patient's success at attaining the goal of low cholesterol was assessed. RECOMMENDATION given include regular exercise 3 days a week and low cholesterol/low fat diet. CLINICAL SUMMARY including written plan to identify barriers unique to the patient due to social or economic  reasons was discussed.  3. Diabetic polyneuropathy associated with type 2 diabetes mellitus (HCC) - Hemoglobin A1c An individual care plan for diabetes was established and reinforced today.  The patient's status was assessed using clinical findings on exam, labs and diagnostic testing. Patient success at meeting goals based on disease specific evidence-based guidelines and found to be fair controlled. Medications  were assessed and patient's understanding of the medical issues , including barriers were assessed. Recommend adherence to a diabetic diet, a graduated exercise program, HgbA1c level is checked quarterly, and urine microalbumin performed yearly .  Annual mono-filament sensation testing performed. Lower blood pressure and control hyperlipidemia is important. Get annual eye exams and annual flu shots and smoking cessation discussed.  Self management goals were discussed.  4. Essential hypertension - CBC with Differential/Platelet - Comprehensive metabolic panel An individual hypertension care plan was established and reinforced today.  The patient's status was assessed using clinical findings on exam and labs or diagnostic tests. The patient's success at meeting treatment goals on disease specific evidence-based guidelines and found to be well controlled. SELF MANAGEMENT: The  patient and I together assessed ways to personally work towards obtaining the recommended goals. RECOMMENDATIONS: avoid decongestants found in common cold remedies, decrease consumption of alcohol, perform routine monitoring of BP with home BP cuff, exercise, reduction of dietary salt, take medicines as prescribed, try not to miss doses and quit smoking.  Regular exercise and maintaining a healthy weight is needed.  Stress reduction may help. A CLINICAL SUMMARY including written plan identify barriers to care unique to individual due to social or financial issues.  We attempt to mutually creat solutions for individual and family understanding.  5. HFrEF (heart failure with reduced ejection fraction) ([place]) Patient has heart failure with the reduced ejection fraction and is continue to see the cardiologist.  He has not had any recent congestive symptoms.  He is on Entresto at the present time  6. Tobacco dependence Patient is tobacco dependent and is now smoking 1/2 pack a day we discussed at length ways of quitting and the need to quit especially with his diabetes and heart disease.  OSA Patient has chronic obstructive sleep apnea and last sleep study was at least 4 years ago.  He has not yet got his CPAP yet we will try to see if we can get work for him.  Gout Patient has a long history of recurrent gout but he denies any more alcohol.  He has had this gouty episode for 1-1/2 weeks with no response to steroids, indomethacin, or colchicine.  On examination he does not have any swelling of any of these joints but complains of pain at all joints.    Meds ordered this encounter  Medications  . oxyCODONE-acetaminophen (PERCOCET) 10-325 MG tablet    Sig: Take 1 tablet by mouth every 8 (eight) hours as needed for up to 20 days for pain.    Dispense:  60 tablet    Refill:  0    Orders Placed This Encounter  Procedures  . CBC with Differential/Platelet  . Comprehensive metabolic panel  .  Hemoglobin A1c  . Lipid panel  . Ambulatory referral to Pain Clinic     Follow-up: Return in about 3 months (around 07/25/2020) for fasting.  An After Visit Summary was printed and given to the patient.  [place] [phone]

## 2020-04-24 NOTE — Addendum Note (Signed)
Addended by: Reinaldo [place] on: 04/24/2020 03:34 PM   Modules accepted: Orders

## 2020-04-25 LAB — CBC WITH DIFFERENTIAL/PLATELET
Basophils Absolute: 0.1 10*3/uL (ref 0.0–0.2)
Basos: 1 %
EOS (ABSOLUTE): 0.1 10*3/uL (ref 0.0–0.4)
Eos: 1 %
Hematocrit: 32.8 % — ABNORMAL LOW (ref 37.5–51.0)
Hemoglobin: 11.3 g/dL — ABNORMAL LOW (ref 13.0–17.7)
Immature Grans (Abs): 0.2 10*3/uL — ABNORMAL HIGH (ref 0.0–0.1)
Immature Granulocytes: 1 %
Lymphocytes Absolute: 4.6 10*3/uL — ABNORMAL HIGH (ref 0.7–3.1)
Lymphs: 30 %
MCH: 31.8 pg (ref 26.6–33.0)
MCHC: 34.5 g/dL (ref 31.5–35.7)
MCV: 92 fL (ref 79–97)
Monocytes Absolute: 1.1 10*3/uL — ABNORMAL HIGH (ref 0.1–0.9)
Monocytes: 7 %
Neutrophils Absolute: 9.5 10*3/uL — ABNORMAL HIGH (ref 1.4–7.0)
Neutrophils: 60 %
Platelets: 756 10*3/uL — ABNORMAL HIGH (ref 150–450)
RBC: 3.55 x10E6/uL — ABNORMAL LOW (ref 4.14–5.80)
RDW: 14.4 % (ref 11.6–15.4)
WBC: 15.5 10*3/uL — ABNORMAL HIGH (ref 3.4–10.8)

## 2020-04-25 LAB — COMPREHENSIVE METABOLIC PANEL
ALT: 22 IU/L (ref 0–44)
AST: 21 IU/L (ref 0–40)
Albumin/Globulin Ratio: 1.5 (ref 1.2–2.2)
Albumin: 3.9 g/dL (ref 3.8–4.8)
Alkaline Phosphatase: 78 IU/L (ref 44–121)
BUN/Creatinine Ratio: 13 (ref 10–24)
BUN: 14 mg/dL (ref 8–27)
Bilirubin Total: 0.2 mg/dL (ref 0.0–1.2)
CO2: 28 mmol/L (ref 20–29)
Calcium: 9 mg/dL (ref 8.6–10.2)
Chloride: 96 mmol/L (ref 96–106)
Creatinine, Ser: 1.06 mg/dL (ref 0.76–1.27)
GFR calc Af Amer: 87 mL/min/{1.73_m2} (ref >59)
GFR calc non Af Amer: 75 mL/min/{1.73_m2} (ref >59)
Globulin, Total: 2.6 g/dL (ref 1.5–4.5)
Glucose: 100 mg/dL — ABNORMAL HIGH (ref 65–99)
Potassium: 4 mmol/L (ref 3.5–5.2)
Sodium: 138 mmol/L (ref 134–144)
Total Protein: 6.5 g/dL (ref 6.0–8.5)

## 2020-04-25 LAB — LIPID PANEL
Chol/HDL Ratio: 4.5 ratio (ref 0.0–5.0)
Cholesterol, Total: 204 mg/dL — ABNORMAL HIGH (ref 100–199)
HDL: 45 mg/dL (ref >39)
LDL Chol Calc (NIH): 116 mg/dL — ABNORMAL HIGH (ref 0–99)
Triglycerides: 248 mg/dL — ABNORMAL HIGH (ref 0–149)
VLDL Cholesterol Cal: 43 mg/dL — ABNORMAL HIGH (ref 5–40)

## 2020-04-25 LAB — HEMOGLOBIN A1C
Est. average glucose Bld gHb Est-mCnc: 117 mg/dL
Hgb A1c MFr Bld: 5.7 % — ABNORMAL HIGH (ref 4.8–5.6)

## 2020-04-25 LAB — CARDIOVASCULAR RISK ASSESSMENT

## 2020-04-25 NOTE — Progress Notes (Signed)
Mild anemia, wbc 15.5 may be stress reaction from coming off narcotics, glucose 100, kidney tests ok, liver tests normal, A1c 5.Cholesterol LDL 116 high, triglycerides high 248 is he using atorvastatin? lp

## 2020-05-01 DIAGNOSIS — I25110 Atherosclerotic heart disease of native coronary artery with unstable angina pectoris: Secondary | ICD-10-CM | POA: Diagnosis not present

## 2020-05-01 DIAGNOSIS — R079 Chest pain, unspecified: Secondary | ICD-10-CM | POA: Diagnosis not present

## 2020-05-01 DIAGNOSIS — R0789 Other chest pain: Secondary | ICD-10-CM | POA: Diagnosis not present

## 2020-05-01 DIAGNOSIS — R1013 Epigastric pain: Secondary | ICD-10-CM | POA: Diagnosis not present

## 2020-05-01 DIAGNOSIS — I10 Essential (primary) hypertension: Secondary | ICD-10-CM | POA: Diagnosis not present

## 2020-05-01 DIAGNOSIS — G4489 Other headache syndrome: Secondary | ICD-10-CM | POA: Diagnosis not present

## 2020-05-02 DIAGNOSIS — R079 Chest pain, unspecified: Secondary | ICD-10-CM | POA: Diagnosis not present

## 2020-05-02 DIAGNOSIS — R935 Abnormal findings on diagnostic imaging of other abdominal regions, including retroperitoneum: Secondary | ICD-10-CM | POA: Diagnosis not present

## 2020-05-02 DIAGNOSIS — E1169 Type 2 diabetes mellitus with other specified complication: Secondary | ICD-10-CM | POA: Diagnosis not present

## 2020-05-02 DIAGNOSIS — I4510 Unspecified right bundle-branch block: Secondary | ICD-10-CM | POA: Diagnosis not present

## 2020-05-02 DIAGNOSIS — Z8719 Personal history of other diseases of the digestive system: Secondary | ICD-10-CM | POA: Diagnosis not present

## 2020-05-02 DIAGNOSIS — K209 Esophagitis, unspecified without bleeding: Secondary | ICD-10-CM | POA: Diagnosis not present

## 2020-05-02 DIAGNOSIS — K229 Disease of esophagus, unspecified: Secondary | ICD-10-CM | POA: Diagnosis not present

## 2020-05-02 DIAGNOSIS — K219 Gastro-esophageal reflux disease without esophagitis: Secondary | ICD-10-CM | POA: Diagnosis not present

## 2020-05-02 DIAGNOSIS — R109 Unspecified abdominal pain: Secondary | ICD-10-CM | POA: Diagnosis not present

## 2020-05-02 DIAGNOSIS — Z794 Long term (current) use of insulin: Secondary | ICD-10-CM | POA: Diagnosis not present

## 2020-05-02 DIAGNOSIS — I160 Hypertensive urgency: Secondary | ICD-10-CM | POA: Diagnosis not present

## 2020-05-02 DIAGNOSIS — E7800 Pure hypercholesterolemia, unspecified: Secondary | ICD-10-CM | POA: Diagnosis not present

## 2020-05-02 DIAGNOSIS — I10 Essential (primary) hypertension: Secondary | ICD-10-CM | POA: Diagnosis not present

## 2020-05-02 DIAGNOSIS — I2510 Atherosclerotic heart disease of native coronary artery without angina pectoris: Secondary | ICD-10-CM | POA: Diagnosis not present

## 2020-05-02 DIAGNOSIS — N281 Cyst of kidney, acquired: Secondary | ICD-10-CM | POA: Diagnosis not present

## 2020-05-02 DIAGNOSIS — E119 Type 2 diabetes mellitus without complications: Secondary | ICD-10-CM | POA: Diagnosis not present

## 2020-05-02 DIAGNOSIS — I252 Old myocardial infarction: Secondary | ICD-10-CM | POA: Diagnosis not present

## 2020-05-02 DIAGNOSIS — I25110 Atherosclerotic heart disease of native coronary artery with unstable angina pectoris: Secondary | ICD-10-CM | POA: Diagnosis not present

## 2020-05-02 DIAGNOSIS — G4733 Obstructive sleep apnea (adult) (pediatric): Secondary | ICD-10-CM | POA: Diagnosis not present

## 2020-05-02 DIAGNOSIS — Z791 Long term (current) use of non-steroidal anti-inflammatories (NSAID): Secondary | ICD-10-CM | POA: Diagnosis not present

## 2020-05-02 DIAGNOSIS — M109 Gout, unspecified: Secondary | ICD-10-CM | POA: Diagnosis not present

## 2020-05-02 DIAGNOSIS — F17210 Nicotine dependence, cigarettes, uncomplicated: Secondary | ICD-10-CM | POA: Diagnosis not present

## 2020-05-02 DIAGNOSIS — K7689 Other specified diseases of liver: Secondary | ICD-10-CM | POA: Diagnosis not present

## 2020-05-02 DIAGNOSIS — R143 Flatulence: Secondary | ICD-10-CM | POA: Diagnosis not present

## 2020-05-02 DIAGNOSIS — K449 Diaphragmatic hernia without obstruction or gangrene: Secondary | ICD-10-CM | POA: Diagnosis not present

## 2020-05-02 DIAGNOSIS — R110 Nausea: Secondary | ICD-10-CM | POA: Diagnosis not present

## 2020-05-02 DIAGNOSIS — E876 Hypokalemia: Secondary | ICD-10-CM | POA: Diagnosis not present

## 2020-05-02 DIAGNOSIS — M19041 Primary osteoarthritis, right hand: Secondary | ICD-10-CM | POA: Diagnosis not present

## 2020-05-02 DIAGNOSIS — R1013 Epigastric pain: Secondary | ICD-10-CM | POA: Diagnosis not present

## 2020-05-02 DIAGNOSIS — I700 Atherosclerosis of aorta: Secondary | ICD-10-CM | POA: Diagnosis not present

## 2020-05-02 DIAGNOSIS — N170 Acute kidney failure with tubular necrosis: Secondary | ICD-10-CM | POA: Diagnosis not present

## 2020-05-02 DIAGNOSIS — D649 Anemia, unspecified: Secondary | ICD-10-CM | POA: Diagnosis not present

## 2020-05-02 DIAGNOSIS — R937 Abnormal findings on diagnostic imaging of other parts of musculoskeletal system: Secondary | ICD-10-CM | POA: Diagnosis not present

## 2020-05-03 DIAGNOSIS — I700 Atherosclerosis of aorta: Secondary | ICD-10-CM | POA: Diagnosis not present

## 2020-05-04 DIAGNOSIS — R079 Chest pain, unspecified: Secondary | ICD-10-CM | POA: Diagnosis not present

## 2020-05-05 DIAGNOSIS — K7689 Other specified diseases of liver: Secondary | ICD-10-CM | POA: Diagnosis not present

## 2020-05-07 DIAGNOSIS — K449 Diaphragmatic hernia without obstruction or gangrene: Secondary | ICD-10-CM | POA: Diagnosis not present

## 2020-05-07 DIAGNOSIS — K229 Disease of esophagus, unspecified: Secondary | ICD-10-CM | POA: Diagnosis not present

## 2020-05-07 DIAGNOSIS — I2510 Atherosclerotic heart disease of native coronary artery without angina pectoris: Secondary | ICD-10-CM | POA: Diagnosis not present

## 2020-06-03 ENCOUNTER — Encounter: Payer: Self-pay | Admitting: Nurse Practitioner

## 2020-06-03 ENCOUNTER — Other Ambulatory Visit: Payer: Self-pay

## 2020-06-03 ENCOUNTER — Ambulatory Visit (INDEPENDENT_AMBULATORY_CARE_PROVIDER_SITE_OTHER): Payer: Medicare Other | Admitting: Nurse Practitioner

## 2020-06-03 ENCOUNTER — Other Ambulatory Visit: Payer: Self-pay | Admitting: Nurse Practitioner

## 2020-06-03 VITALS — BP 142/83 | HR 60 | Temp 97.3°F | Ht 68.0 in | Wt 283.0 lb

## 2020-06-03 DIAGNOSIS — E1140 Type 2 diabetes mellitus with diabetic neuropathy, unspecified: Secondary | ICD-10-CM

## 2020-06-03 DIAGNOSIS — Z0000 Encounter for general adult medical examination without abnormal findings: Secondary | ICD-10-CM

## 2020-06-03 DIAGNOSIS — Z794 Long term (current) use of insulin: Secondary | ICD-10-CM

## 2020-06-03 DIAGNOSIS — H9193 Unspecified hearing loss, bilateral: Secondary | ICD-10-CM

## 2020-06-03 DIAGNOSIS — I5020 Unspecified systolic (congestive) heart failure: Secondary | ICD-10-CM

## 2020-06-03 DIAGNOSIS — I2510 Atherosclerotic heart disease of native coronary artery without angina pectoris: Secondary | ICD-10-CM | POA: Diagnosis not present

## 2020-06-03 DIAGNOSIS — I25118 Atherosclerotic heart disease of native coronary artery with other forms of angina pectoris: Secondary | ICD-10-CM

## 2020-06-03 DIAGNOSIS — Z23 Encounter for immunization: Secondary | ICD-10-CM | POA: Diagnosis not present

## 2020-06-03 MED ORDER — NITROGLYCERIN 0.4 MG SL SUBL: 0.4000 mg | SUBLINGUAL_TABLET | SUBLINGUAL | 3 refills | Status: DC | PRN

## 2020-06-03 NOTE — Progress Notes (Signed)
Subjective:  Patient ID: Brent Ortiz, male    DOB: 1961-06-18  Age: 59 y.o. MRN: [id number]  CC Medicare annual wellness   HPI Encounter for Medicare Annual Wellness visit. Pt has a history of Type 2 DM that requires insulin, Heart Failure, Hypertension, COPD, and GERD. Pt is currently residing in [facility] rehab facility for alcohol and opioid substance abuse. Pt is accompanied by daughter. Resources provided for [place] for assistance with housing post-rehab discharge.     .    Alcohol Screen:   . Last Alcohol Screening Score (AUDIT): Not on file  Depression (PHQ2-9): Medium Risk  . PHQ-2 Score: 21  Financial Resource Strain:   . Difficulty of Paying Living Expenses: Not on file  Food Insecurity:   . Worried About Charity fundraiser in the Last Year: Not on file  . Ran Out of Food in the Last Year: Not on file  Housing:   . Last Housing Risk Score: Not on file  Physical Activity:   . Days of Exercise per Week: Not on file  . Minutes of Exercise per Session: Not on file  Social Connections:   . Frequency of Communication with Friends and Family: Not on file  . Frequency of Social Gatherings with Friends and Family: Not on file  . Attends Religious Services: Not on file  . Active Member of Clubs or Organizations: Not on file  . Attends Archivist Meetings: Not on file  . Marital Status: Not on file  Stress:   . Feeling of Stress : Not on file  Tobacco Use: High Risk  . Smoking Tobacco Use: Current Every Day Smoker  . Smokeless Tobacco Use: Never Used  Transportation Needs:   . Film/video editor (Medical): Not on file  . Lack of Transportation (Non-Medical): Not on file    Fall Risk  12/06/2019 09/29/2011  Falls in the past year? 1 -  Number falls in past yr: 1 -  Injury with Fall? 0 -  Risk for fall due to : Impaired balance/gait;Impaired mobility Impaired balance/gait;Impaired mobility  Follow up Falls evaluation completed -    Depression  screen [facility] 2/9 04/24/2020 12/06/2019 09/29/2011  Decreased Interest 3 1 0  Down, Depressed, Hopeless 3 1 0  PHQ - 2 Score 6 2 0  Altered sleeping 2 1 -  Tired, decreased energy 3 1 -  Change in appetite 2 1 -  Feeling bad or failure about yourself  3 1 -  Trouble concentrating 3 2 -  Moving slowly or fidgety/restless 2 2 -  Suicidal thoughts 0 2 -  PHQ-9 Score 21 12 -  Difficult doing work/chores Very difficult Very difficult -    Functional Status Survey:     Safety: reviewed ;  Patient wears a seat belt. Patient's home has smoke detectors and carbon monoxide detectors. Patient practices appropriate gun safety Patient wears sunscreen with extended sun exposure. Dental Care: biannual cleanings, brushes and flosses daily. Ophthalmology/Optometry: Over two years ago Hearing loss: yes  Vision impairments: wears eyeglasses Patient is not afflicted from Stress Incontinence and Urge Incontinence   Current Outpatient Medications on File Prior to Visit  Medication Sig Dispense Refill  . acetaminophen (TYLENOL) 500 MG tablet Take 500 mg by mouth every 6 (six) hours as needed for mild pain.     Marland Kitchen albuterol (VENTOLIN HFA) 108 (90 Base) MCG/ACT inhaler SMARTSIG:2 Puff(s) By Mouth Every 4 Hours PRN    . aspirin 81  MG tablet Take 81 mg by mouth daily.    Marland Kitchen atorvastatin (LIPITOR) 40 MG tablet Take 40 mg by mouth daily.    . budesonide-formoterol (SYMBICORT) 80-4.5 MCG/ACT inhaler Inhale 2 puffs into the lungs 2 (two) times daily. 3 Inhaler 2  . colchicine-probenecid 0.5-500 MG tablet Take 1 tablet by mouth 2 (two) times daily as needed (GOUT). 180 tablet 2  . gabapentin (NEURONTIN) 600 MG tablet TAKE 1 TABLET(600 MG) BY MOUTH EVERY MORNING 90 tablet 2  . indomethacin (INDOCIN) 25 MG capsule Take 25 mg by mouth 3 (three) times daily.    . Insulin Pen Needle (PEN NEEDLES) 31G X 8 MM MISC 1 each by Does not apply route daily. 100 each 3  . LANTUS SOLOSTAR 100 UNIT/ML Solostar Pen ADMINISTER 30  UNITS UNDER THE SKIN DAILY 15 mL 5  . lisinopril (ZESTRIL) 10 MG tablet Take 10 mg by mouth daily.    . meloxicam (MOBIC) 15 MG tablet Take 1 tablet (15 mg total) by mouth daily. 90 tablet 2  . metFORMIN (GLUCOPHAGE) 500 MG tablet Take 1 tablet (500 mg total) by mouth daily with supper. 180 tablet 2  . nitroGLYCERIN (NITROSTAT) 0.4 MG SL tablet Place 1 tablet (0.4 mg total) under the tongue every 5 (five) minutes as needed. For chest pain 50 tablet 3  . pantoprazole (PROTONIX) 40 MG tablet Take 1 tablet (40 mg total) by mouth daily. 90 tablet 2  . potassium chloride SA (KLOR-CON) 20 MEQ tablet Take 20 mEq by mouth 4 (four) times daily.    . rosuvastatin (CRESTOR) 20 MG tablet Take 20 mg by mouth at bedtime.    . sacubitril-valsartan (ENTRESTO) 24-26 MG Take 1 tablet by mouth 2 (two) times daily. 60 tablet 11  . sertraline (ZOLOFT) 25 MG tablet Take 1 tablet (25 mg total) by mouth daily. 90 tablet 2  . spironolactone (ALDACTONE) 25 MG tablet Take 0.5 tablets (12.5 mg total) by mouth daily. 45 tablet 3  . sucralfate (CARAFATE) 1 g tablet Take 1 g by mouth 4 (four) times daily.    . tamsulosin (FLOMAX) 0.4 MG CAPS capsule Take 1 capsule (0.4 mg total) by mouth daily. 90 capsule 2  . traZODone (DESYREL) 150 MG tablet Take 1 tablet (150 mg total) by mouth at bedtime. 90 tablet 2   No current facility-administered medications on file prior to visit.    Social Hx   Social History   Socioeconomic History  . Marital status: Married    Spouse name: Not on file  . Number of children: 5  . Years of education: Not on file  . Highest education level: Not on file  Occupational History  . Occupation: CNA  Tobacco Use  . Smoking status: Current Every Day Smoker    Packs/day: 0.50    Years: 37.00    Pack years: 18.50    Types: Cigarettes  . Smokeless tobacco: Never Used  Vaping Use  . Vaping Use: Former  Substance and Sexual Activity  . Alcohol use: No    Comment: heavy drinker in past   . Drug  use: No  . Sexual activity: Not Currently  Other Topics Concern  . Not on file  Social History Narrative  . Not on file   Social Determinants of Health   Financial Resource Strain:   . Difficulty of Paying Living Expenses: Not on file  Food Insecurity:   . Worried About Charity fundraiser in the Last Year: Not on file  .  Ran Out of Food in the Last Year: Not on file  Transportation Needs:   . Lack of Transportation (Medical): Not on file  . Lack of Transportation (Non-Medical): Not on file  Physical Activity:   . Days of Exercise per Week: Not on file  . Minutes of Exercise per Session: Not on file  Stress:   . Feeling of Stress : Not on file  Social Connections:   . Frequency of Communication with Friends and Family: Not on file  . Frequency of Social Gatherings with Friends and Family: Not on file  . Attends Religious Services: Not on file  . Active Member of Clubs or Organizations: Not on file  . Attends Archivist Meetings: Not on file  . Marital Status: Not on file   Past Medical History:  Diagnosis Date  . Acute combined systolic and diastolic CHF, NYHA class 2 ([place]) 01/22/2020  . Alcohol abuse   . Chronic pain 10/19/2019   Sees pain clinic  . Coronary artery disease   . Coronary atherosclerosis of native coronary artery 01/23/2016   Overview:  Completely occluded circumflex artery proximal to obtuse marginal 1 basin cardiac catheter from 2017  . Diabetic polyneuropathy ([place]) 12/06/2019  . ED (erectile dysfunction) 12/06/2019  . Essential hypertension 01/23/2016  . HTN (hypertension)   . Leukocytosis 09/04/2011  . Mixed hyperlipidemia 06/18/2017  . Morbid obesity with BMI of 40.0-44.9, adult ([place]) 01/13/2013  . Myocardial infarction ([place])    04-13-2011  . Obstructive chronic bronchitis with exacerbation ([place]) 10/03/2019  . Opiate abuse, continuous ([place])   . Osteoarthritis   . Pain in knee joint 02/12/2012   Formatting of this note might be different from the  original. Left  . Pneumonia   . Pneumonia due to infectious organism 09/04/2011  . Polysubstance abuse ([place])   . Poor dentition   . Thrombocytosis 09/29/2011  . Tobacco dependence 01/23/2016  . Tobacco use disorder 01/23/2016   Family History  Problem Relation Age of Onset  . Colon cancer Other   . Colon cancer Mother     Review of Systems  Constitutional: Negative for fatigue and fever.  HENT: Positive for hearing loss. Negative for congestion, ear pain, sinus pressure and sore throat.   Eyes: Negative for pain.  Respiratory: Negative for cough, chest tightness, shortness of breath and wheezing.   Cardiovascular: Negative for chest pain and palpitations.  Gastrointestinal: Negative for abdominal pain, constipation, diarrhea, nausea and vomiting.  Genitourinary: Negative for dysuria and hematuria.  Musculoskeletal: Positive for arthralgias (chronic right hip pain) and myalgias. Negative for back pain and joint swelling.  Skin: Negative for rash.  Allergic/Immunologic: Negative for environmental allergies.  Neurological: Negative for dizziness, weakness and headaches.  Psychiatric/Behavioral: Negative for dysphoric mood. The patient is not nervous/anxious.      Objective:  BP (!) 142/83 (BP Location: Left Arm, Patient Position: Sitting)   Pulse 60   Temp (!) 97.3 F (36.3 C) (Temporal)   Ht 5\' 8"  (1.727 m)   Wt 283 lb (128.4 kg)   SpO2 100%   BMI 43.03 kg/m    BP/Weight 04/24/2020 02/08/2020 12/10/3708  Systolic BP 626 948 546  Diastolic BP 98 84 80  Wt. (Lbs) 267 271 274  BMI 40.6 41.21 41.66    Physical Exam Vitals reviewed.  Constitutional:      Appearance: Normal appearance.  HENT:     Head: Normocephalic.  Cardiovascular:     Rate and Rhythm: Normal rate and regular rhythm.  Pulses: Normal pulses.     Heart sounds: Normal heart sounds.  Pulmonary:     Effort: Pulmonary effort is normal.     Breath sounds: Normal breath sounds.  Skin:    General: Skin is  warm and dry.     Capillary Refill: Capillary refill takes less than 2 seconds.  Neurological:     General: No focal deficit present.     Mental Status: He is alert and oriented to person, place, and time.  Psychiatric:        Mood and Affect: Mood normal.        Behavior: Behavior normal.     Lab Results  Component Value Date   WBC 15.5 (H) 04/24/2020   HGB 11.3 (L) 04/24/2020   HCT 32.8 (L) 04/24/2020   PLT 756 (H) 04/24/2020   GLUCOSE 100 (H) 04/24/2020   CHOL 204 (H) 04/24/2020   TRIG 248 (H) 04/24/2020   HDL 45 04/24/2020   LDLCALC 116 (H) 04/24/2020   ALT 22 04/24/2020   AST 21 04/24/2020   NA 138 04/24/2020   K 4.0 04/24/2020   CL 96 04/24/2020   CREATININE 1.06 04/24/2020   BUN 14 04/24/2020   CO2 28 04/24/2020   INR 1.81 (H) 09/07/2011   HGBA1C 5.7 (H) 04/24/2020      Assessment & Plan:   1. Medicare annual wellness visit, subsequent -Complete Advance directive packet and return notarized, signed copy to office  2. HFrEF (heart failure with reduced ejection fraction) (HCC) -Continue Lisinopril and Entresto daily  3. Atherosclerosis of native coronary artery of native heart, unspecified whether angina present -Continue Entresto daily -Continue heart-healthy diet -Increase physical activity as tolerated   4. Type 2 diabetes mellitus with diabetic neuropathy, with long-term current use of insulin (HCC) -Continue Diabetic diet -Continue diabetes medications: Lantus insulin 30 units [place] daily and Metformin 500 mg daily  5. Need for vaccination - Flu Vaccine MDCK QUAD PF    These are the goals we discussed: Goals    . Quit Smoking     Complete and return Advance Directive Packet     This is a list of the screening recommended for you and due dates:  Health Maintenance  Topic Date Due  .  Hepatitis C: One time screening is recommended by Center for Disease Control  (CDC) for  adults born from [birth year] through 1967.   Never done  . Pneumococcal  vaccine  Never done  . Urine Protein Check  Never done  . COVID-19 Vaccine (1) Never done  . HIV Screening  Never done  . Tetanus Vaccine  Never done  . Eye exam for diabetics  08/20/2019  . Flu Shot  02/04/2020  . Hemoglobin A1C  10/23/2020  . Complete foot exam   04/24/2021  . Colon Cancer Screening  07/06/2024    Return in 22-months for follow-up of medical conditions  Consider smoking cessation to improve overall health Continue home medications Discuss Advance Directives with family, return signed,notarized copy to office Resources given for [place]: was established or reinforced today.   SELF MANAGEMENT: The patient and I together assessed ways to personally work towards obtaining the recommended goals  Support needs The patient and/or family needs were assessed and services were offered and not necessary at this time.    Follow-up: Return in about 3 months (around 09/02/2020) for fasting wth Dr [name] COPD, DM. [place] [phone]

## 2020-06-03 NOTE — Patient Instructions (Addendum)
Return in 60-months for follow-up of medical conditions  Consider smoking cessation to improve overall health Continue home medications Discuss Advance Directives with family, return signed,notarized copy to office Resources given for [place] [facility]  Fall Prevention in the Home, Adult Falls can cause injuries. They can happen to people of all ages. There are many things you can do to make your home safe and to help prevent falls. Ask for help when making these changes, if needed. What actions can I take to prevent falls? General Instructions  Use good lighting in all rooms. Replace any light bulbs that burn out.  Turn on the lights when you go into a dark area. Use night-lights.  Keep items that you use often in easy-to-reach places. Lower the shelves around your home if necessary.  Set up your furniture so you have a clear path. Avoid moving your furniture around.  Do not have throw rugs and other things on the floor that can make you trip.  Avoid walking on wet floors.  If any of your floors are uneven, fix them.  Add color or contrast paint or tape to clearly mark and help you see: ? Any grab bars or handrails. ? First and last steps of stairways. ? Where the edge of each step is.  If you use a stepladder: ? Make sure that it is fully opened. Do not climb a closed stepladder. ? Make sure that both sides of the stepladder are locked into place. ? Ask someone to hold the stepladder for you while you use it.  If there are any pets around you, be aware of where they are. What can I do in the bathroom?      Keep the floor dry. Clean up any water that spills onto the floor as soon as it happens.  Remove soap buildup in the tub or shower regularly.  Use non-skid mats or decals on the floor of the tub or shower.  Attach bath mats securely with double-sided, non-slip rug tape.  If you need to sit down in the shower, use a plastic, non-slip stool.  Install grab bars by the  toilet and in the tub and shower. Do not use towel bars as grab bars. What can I do in the bedroom?  Make sure that you have a light by your bed that is easy to reach.  Do not use any sheets or blankets that are too big for your bed. They should not hang down onto the floor.  Have a firm chair that has side arms. You can use this for support while you get dressed. What can I do in the kitchen?  Clean up any spills right away.  If you need to reach something above you, use a strong step stool that has a grab bar.  Keep electrical cords out of the way.  Do not use floor polish or wax that makes floors slippery. If you must use wax, use non-skid floor wax. What can I do with my stairs?  Do not leave any items on the stairs.  Make sure that you have a light switch at the top of the stairs and the bottom of the stairs. If you do not have them, ask someone to add them for you.  Make sure that there are handrails on both sides of the stairs, and use them. Fix handrails that are broken or loose. Make sure that handrails are as long as the stairways.  Install non-slip stair treads on all stairs  in your home.  Avoid having throw rugs at the top or bottom of the stairs. If you do have throw rugs, attach them to the floor with carpet tape.  Choose a carpet that does not hide the edge of the steps on the stairway.  Check any carpeting to make sure that it is firmly attached to the stairs. Fix any carpet that is loose or worn. What can I do on the outside of my home?  Use bright outdoor lighting.  Regularly fix the edges of walkways and driveways and fix any cracks.  Remove anything that might make you trip as you walk through a door, such as a raised step or threshold.  Trim any bushes or trees on the path to your home.  Regularly check to see if handrails are loose or broken. Make sure that both sides of any steps have handrails.  Install guardrails along the edges of any raised decks  and porches.  Clear walking paths of anything that might make someone trip, such as tools or rocks.  Have any leaves, snow, or ice cleared regularly.  Use sand or salt on walking paths during winter.  Clean up any spills in your garage right away. This includes grease or oil spills. What other actions can I take?  Wear shoes that: ? Have a low heel. Do not wear high heels. ? Have rubber bottoms. ? Are comfortable and fit you well. ? Are closed at the toe. Do not wear open-toe sandals.  Use tools that help you move around (mobility aids) if they are needed. These include: ? Canes. ? Walkers. ? Scooters. ? Crutches.  Review your medicines with your doctor. Some medicines can make you feel dizzy. This can increase your chance of falling. Ask your doctor what other things you can do to help prevent falls. Where to find more information  Centers for Disease Control and Prevention, [employer]: https://garcia.biz/  [employer] on Aging: BrainJudge.co.uk Contact a doctor if:  You are afraid of falling at home.  You feel weak, drowsy, or dizzy at home.  You fall at home. Summary  There are many simple things that you can do to make your home safe and to help prevent falls.  Ways to make your home safe include removing tripping hazards and installing grab bars in the bathroom.  Ask for help when making these changes in your home. This information is not intended to replace advice given to you by your health care provider. Make sure you discuss any questions you have with your health care provider. Document Revised: 10/13/2018 Document Reviewed: 02/04/2017 Elsevier Patient Education  2020 [place] Directive  Advance directives are legal documents that let you make choices ahead of time about your health care and medical treatment in case you become unable to communicate for yourself. Advance directives are a way for you to make known your wishes to  family, friends, and health care providers. This can let others know about your end-of-life care if you become unable to communicate. Discussing and writing advance directives should happen over time rather than all at once. Advance directives can be changed depending on your situation and what you want, even after you have signed the advance directives. There are different types of advance directives, such as:  Medical power of attorney.  Living will.  Do not resuscitate (DNR) or do not attempt resuscitation (DNAR) order. Health care proxy and medical power of attorney A health care proxy is also called  a health care agent. This is a person who is appointed to make medical decisions for you in cases where you are unable to make the decisions yourself. Generally, people choose someone they know well and trust to represent their preferences. Make sure to ask this person for an agreement to act as your proxy. A proxy may have to exercise judgment in the event of a medical decision for which your wishes are not known. A medical power of attorney is a legal document that names your health care proxy. Depending on the laws in your state, after the document is written, it may also need to be:  Signed.  Notarized.  Dated.  Copied.  Witnessed.  Incorporated into your medical record. You may also want to appoint someone to manage your money in a situation in which you are unable to do so. This is called a durable power of attorney for finances. It is a separate legal document from the durable power of attorney for health care. You may choose the same person or someone different from your health care proxy to act as your agent in money matters. If you do not appoint a proxy, or if there is a concern that the proxy is not acting in your best interests, a court may appoint a guardian to act on your behalf. Living will A living will is a set of instructions that state your wishes about medical care  when you cannot express them yourself. Health care providers should keep a copy of your living will in your medical record. You may want to give a copy to family members or friends. To alert caregivers in case of an emergency, you can place a card in your wallet to let them know that you have a living will and where they can find it. A living will is used if you become:  Terminally ill.  Disabled.  Unable to communicate or make decisions. Items to consider in your living will include:  To use or not to use life-support equipment, such as dialysis machines and breathing machines (ventilators).  A DNR or DNAR order. This tells health care providers not to use cardiopulmonary resuscitation (CPR) if breathing or heartbeat stops.  To use or not to use tube feeding.  To be given or not to be given food and fluids.  Comfort (palliative) care when the goal becomes comfort rather than a cure.  Donation of organs and tissues. A living will does not give instructions for distributing your money and property if you should pass away. DNR or DNAR A DNR or DNAR order is a request not to have CPR in the event that your heart stops beating or you stop breathing. If a DNR or DNAR order has not been made and shared, a health care provider will try to help any patient whose heart has stopped or who has stopped breathing. If you plan to have surgery, talk with your health care provider about how your DNR or DNAR order will be followed if problems occur. What if I do not have an advance directive? If you do not have an advance directive, some states assign family decision makers to act on your behalf based on how closely you are related to them. Each state has its own laws about advance directives. You may want to check with your health care provider, attorney, or state representative about the laws in your state. Summary  Advance directives are the legal documents that allow you to make choices ahead  of time  about your health care and medical treatment in case you become unable to tell others about your care.  The process of discussing and writing advance directives should happen over time. You can change the advance directives, even after you have signed them.  Advance directives include DNR or DNAR orders, living wills, and designating an agent as your medical power of attorney. This information is not intended to replace advice given to you by your health care provider. Make sure you discuss any questions you have with your health care provider. Document Revised: 01/19/2019 Document Reviewed: 01/19/2019 Elsevier Patient Education  2020 [employer]. Preventive Care 40-77 Years Old, Male Preventive care refers to lifestyle choices and visits with your health care provider that can promote health and wellness. This includes:  A yearly physical exam. This is also called an annual well check.  Regular dental and eye exams.  Immunizations.  Screening for certain conditions.  Healthy lifestyle choices, such as eating a healthy diet, getting regular exercise, not using drugs or products that contain nicotine and tobacco, and limiting alcohol use. What can I expect for my preventive care visit? Physical exam Your health care provider will check:  Height and weight. These may be used to calculate body mass index (BMI), which is a measurement that tells if you are at a healthy weight.  Heart rate and blood pressure.  Your skin for abnormal spots. Counseling Your health care provider may ask you questions about:  Alcohol, tobacco, and drug use.  Emotional well-being.  Home and relationship well-being.  Sexual activity.  Eating habits.  Work and work Astronomer. What immunizations do I need?  Influenza (flu) vaccine  This is recommended every year. Tetanus, diphtheria, and pertussis (Tdap) vaccine  You may need a Td booster every 10 years. Varicella (chickenpox) vaccine  You  may need this vaccine if you have not already been vaccinated. Zoster (shingles) vaccine  You may need this after age 55. Measles, mumps, and rubella (MMR) vaccine  You may need at least one dose of MMR if you were born in 1957 or later. You may also need a second dose. Pneumococcal conjugate (PCV13) vaccine  You may need this if you have certain conditions and were not previously vaccinated. Pneumococcal polysaccharide (PPSV23) vaccine  You may need one or two doses if you smoke cigarettes or if you have certain conditions. Meningococcal conjugate (MenACWY) vaccine  You may need this if you have certain conditions. Hepatitis A vaccine  You may need this if you have certain conditions or if you travel or work in places where you may be exposed to hepatitis A. Hepatitis B vaccine  You may need this if you have certain conditions or if you travel or work in places where you may be exposed to hepatitis B. Haemophilus influenzae type b (Hib) vaccine  You may need this if you have certain risk factors. Human papillomavirus (HPV) vaccine  If recommended by your health care provider, you may need three doses over 6 months. You may receive vaccines as individual doses or as more than one vaccine together in one shot (combination vaccines). Talk with your health care provider about the risks and benefits of combination vaccines. What tests do I need? Blood tests  Lipid and cholesterol levels. These may be checked every 5 years, or more frequently if you are over 71 years old.  Hepatitis C test.  Hepatitis B test. Screening  Lung cancer screening. You may have this screening every  year starting at age 60 if you have a 30-pack-year history of smoking and currently smoke or have quit within the past 15 years.  Prostate cancer screening. Recommendations will vary depending on your family history and other risks.  Colorectal cancer screening. All adults should have this screening  starting at age 57 and continuing until age 67. Your health care provider may recommend screening at age 51 if you are at increased risk. You will have tests every 1-10 years, depending on your results and the type of screening test.  Diabetes screening. This is done by checking your blood sugar (glucose) after you have not eaten for a while (fasting). You may have this done every 1-3 years.  Sexually transmitted disease (STD) testing. Follow these instructions at home: Eating and drinking  Eat a diet that includes fresh fruits and vegetables, whole grains, lean protein, and low-fat dairy products.  Take vitamin and mineral supplements as recommended by your health care provider.  Do not drink alcohol if your health care provider tells you not to drink.  If you drink alcohol: ? Limit how much you have to 0-2 drinks a day. ? Be aware of how much alcohol is in your drink. In the [place] one drink equals one 12 oz bottle of beer (355 mL), one 5 oz glass of wine (148 mL), or one 1 oz glass of hard liquor (44 mL). Lifestyle  Take daily care of your teeth and gums.  Stay active. Exercise for at least 30 minutes on 5 or more days each week.  Do not use any products that contain nicotine or tobacco, such as cigarettes, e-cigarettes, and chewing tobacco. If you need help quitting, ask your health care provider.  If you are sexually active, practice safe sex. Use a condom or other form of protection to prevent STIs (sexually transmitted infections).  Talk with your health care provider about taking a low-dose aspirin every day starting at age 34. What's next?  Go to your health care provider once a year for a well check visit.  Ask your health care provider how often you should have your eyes and teeth checked.  Stay up to date on all vaccines. This information is not intended to replace advice given to you by your health care provider. Make sure you discuss any questions you have with your  health care provider. Document Revised: 06/16/2018 Document Reviewed: 06/16/2018 Elsevier Patient Education  2020 [employer].  Steps to Quit Smoking Smoking tobacco is the leading cause of preventable death. It can affect almost every organ in the body. Smoking puts you and people around you at risk for many serious, long-lasting (chronic) diseases. Quitting smoking can be hard, but it is one of the best things that you can do for your health. It is never too late to quit. How do I get ready to quit? When you decide to quit smoking, make a plan to help you succeed. Before you quit:  Pick a date to quit. Set a date within the next 2 weeks to give you time to prepare.  Write down the reasons why you are quitting. Keep this list in places where you will see it often.  Tell your family, friends, and co-workers that you are quitting. Their support is important.  Talk with your doctor about the choices that may help you quit.  Find out if your health insurance will pay for these treatments.  Know the people, places, things, and activities that make you want to  smoke (triggers). Avoid them. What first steps can I take to quit smoking?  Throw away all cigarettes at home, at work, and in your car.  Throw away the things that you use when you smoke, such as ashtrays and lighters.  Clean your car. Make sure to empty the ashtray.  Clean your home, including curtains and carpets. What can I do to help me quit smoking? Talk with your doctor about taking medicines and seeing a counselor at the same time. You are more likely to succeed when you do both.  If you are pregnant or breastfeeding, talk with your doctor about counseling or other ways to quit smoking. Do not take medicine to help you quit smoking unless your doctor tells you to do so. To quit smoking: Quit right away  Quit smoking totally, instead of slowly cutting back on how much you smoke over a period of time.  Go to counseling.  You are more likely to quit if you go to counseling sessions regularly. Take medicine You may take medicines to help you quit. Some medicines need a prescription, and some you can buy over-the-counter. Some medicines may contain a drug called nicotine to replace the nicotine in cigarettes. Medicines may:  Help you to stop having the desire to smoke (cravings).  Help to stop the problems that come when you stop smoking (withdrawal symptoms). Your doctor may ask you to use:  Nicotine patches, gum, or lozenges.  Nicotine inhalers or sprays.  Non-nicotine medicine that is taken by mouth. Find resources Find resources and other ways to help you quit smoking and remain smoke-free after you quit. These resources are most helpful when you use them often. They include:  Online chats with a Social worker.  Phone quitlines.  Printed Furniture conservator/restorer.  Support groups or group counseling.  Text messaging programs.  Mobile phone apps. Use apps on your mobile phone or tablet that can help you stick to your quit plan. There are many free apps for mobile phones and tablets as well as websites. Examples include Quit Guide from the [employer] and smokefree.gov  What things can I do to make it easier to quit?   Talk to your family and friends. Ask them to support and encourage you.  Call a phone quitline (1-800-QUIT-NOW), reach out to support groups, or work with a Social worker.  Ask people who smoke to not smoke around you.  Avoid places that make you want to smoke, such as: ? Bars. ? Parties. ? Smoke-break areas at work.  Spend time with people who do not smoke.  Lower the stress in your life. Stress can make you want to smoke. Try these things to help your stress: ? Getting regular exercise. ? Doing deep-breathing exercises. ? Doing yoga. ? Meditating. ? Doing a body scan. To do this, close your eyes, focus on one area of your body at a time from head to toe. Notice which parts of your body are  tense. Try to relax the muscles in those areas. How will I feel when I quit smoking? Day 1 to 3 weeks Within the first 24 hours, you may start to have some problems that come from quitting tobacco. These problems are very bad 2-3 days after you quit, but they do not often last for more than 2-3 weeks. You may get these symptoms:  Mood swings.  Feeling restless, nervous, angry, or annoyed.  Trouble concentrating.  Dizziness.  Strong desire for high-sugar foods and nicotine.  Weight gain.  Trouble  pooping (constipation).  Feeling like you may vomit (nausea).  Coughing or a sore throat.  Changes in how the medicines that you take for other issues work in your body.  Depression.  Trouble sleeping (insomnia). Week 3 and afterward After the first 2-3 weeks of quitting, you may start to notice more positive results, such as:  Better sense of smell and taste.  Less coughing and sore throat.  Slower heart rate.  Lower blood pressure.  Clearer skin.  Better breathing.  Fewer sick days. Quitting smoking can be hard. Do not give up if you fail the first time. Some people need to try a few times before they succeed. Do your best to stick to your quit plan, and talk with your doctor if you have any questions or concerns. Summary  Smoking tobacco is the leading cause of preventable death. Quitting smoking can be hard, but it is one of the best things that you can do for your health.  When you decide to quit smoking, make a plan to help you succeed.  Quit smoking right away, not slowly over a period of time.  When you start quitting, seek help from your doctor, family, or friends. This information is not intended to replace advice given to you by your health care provider. Make sure you discuss any questions you have with your health care provider. Document Revised: 03/17/2019 Document Reviewed: 09/10/2018 Elsevier Patient Education  [place] [facility]  Hypertension Hypertension is commonly called high blood pressure. This is when the force of your blood pressing against the walls of your arteries is too strong. Arteries are blood vessels that carry blood from your heart throughout your body. Hypertension forces the heart to work harder to pump blood, and may cause the arteries to become narrow or stiff. Having untreated or uncontrolled hypertension can cause heart attack, stroke, kidney disease, and other problems. What are blood pressure readings? A blood pressure reading consists of a higher number over a lower number. Ideally, your blood pressure should be below 120/80. The first ("top") number is called the systolic pressure. It is a measure of the pressure in your arteries as your heart beats. The second ("bottom") number is called the diastolic pressure. It is a measure of the pressure in your arteries as the heart relaxes. What does my blood pressure reading mean? Blood pressure is classified into four stages. Based on your blood pressure reading, your health care provider may use the following stages to determine what type of treatment you need, if any. Systolic pressure and diastolic pressure are measured in a unit called mm Hg. Normal  Systolic pressure: below 778.  Diastolic pressure: below 80. Elevated  Systolic pressure: 242-353.  Diastolic pressure: below 80. Hypertension stage 1  Systolic pressure: 614-431.  Diastolic pressure: 54-00. Hypertension stage 2  Systolic pressure: 867 or above.  Diastolic pressure: 90 or above. What health risks are associated with hypertension? Managing your hypertension is an important responsibility. Uncontrolled hypertension can lead to:  A heart attack.  A stroke.  A weakened blood vessel (aneurysm).  Heart failure.  Kidney damage.  Eye damage.  Metabolic syndrome.  Memory and concentration problems. What changes can I make to manage my hypertension? Hypertension can be  managed by making lifestyle changes and possibly by taking medicines. Your health care provider will help you make a plan to bring your blood pressure within a normal range. Eating and drinking   Eat a diet that is high in  fiber and potassium, and low in salt (sodium), added sugar, and fat. An example eating plan is called the DASH (Dietary Approaches to Stop Hypertension) diet. To eat this way: ? Eat plenty of fresh fruits and vegetables. Try to fill half of your plate at each meal with fruits and vegetables. ? Eat whole grains, such as whole wheat pasta, brown rice, or whole grain bread. Fill about one quarter of your plate with whole grains. ? Eat low-fat diary products. ? Avoid fatty cuts of meat, processed or cured meats, and poultry with skin. Fill about one quarter of your plate with lean proteins such as fish, chicken without skin, beans, eggs, and tofu. ? Avoid premade and processed foods. These tend to be higher in sodium, added sugar, and fat.  Reduce your daily sodium intake. Most people with hypertension should eat less than 1,500 mg of sodium a day.  Limit alcohol intake to no more than 1 drink a day for nonpregnant women and 2 drinks a day for men. One drink equals 12 oz of beer, 5 oz of wine, or 1 oz of hard liquor. Lifestyle  Work with your health care provider to maintain a healthy body weight, or to lose weight. Ask what an ideal weight is for you.  Get at least 30 minutes of exercise that causes your heart to beat faster (aerobic exercise) most days of the week. Activities may include walking, swimming, or biking.  Include exercise to strengthen your muscles (resistance exercise), such as weight lifting, as part of your weekly exercise routine. Try to do these types of exercises for 30 minutes at least 3 days a week.  Do not use any products that contain nicotine or tobacco, such as cigarettes and e-cigarettes. If you need help quitting, ask your health care  provider.  Control any long-term (chronic) conditions you have, such as high cholesterol or diabetes. Monitoring  Monitor your blood pressure at home as told by your health care provider. Your personal target blood pressure may vary depending on your medical conditions, your age, and other factors.  Have your blood pressure checked regularly, as often as told by your health care provider. Working with your health care provider  Review all the medicines you take with your health care provider because there may be side effects or interactions.  Talk with your health care provider about your diet, exercise habits, and other lifestyle factors that may be contributing to hypertension.  Visit your health care provider regularly. Your health care provider can help you create and adjust your plan for managing hypertension. Will I need medicine to control my blood pressure? Your health care provider may prescribe medicine if lifestyle changes are not enough to get your blood pressure under control, and if:  Your systolic blood pressure is 130 or higher.  Your diastolic blood pressure is 80 or higher. Take medicines only as told by your health care provider. Follow the directions carefully. Blood pressure medicines must be taken as prescribed. The medicine does not work as well when you skip doses. Skipping doses also puts you at risk for problems. Contact a health care provider if:  You think you are having a reaction to medicines you have taken.  You have repeated (recurrent) headaches.  You feel dizzy.  You have swelling in your ankles.  You have trouble with your vision. Get help right away if:  You develop a severe headache or confusion.  You have unusual weakness or numbness, or you feel faint.  You have severe pain in your chest or abdomen.  You vomit repeatedly.  You have trouble breathing. Summary  Hypertension is when the force of blood pumping through your arteries is  too strong. If this condition is not controlled, it may put you at risk for serious complications.  Your personal target blood pressure may vary depending on your medical conditions, your age, and other factors. For most people, a normal blood pressure is less than 120/80.  Hypertension is managed by lifestyle changes, medicines, or both. Lifestyle changes include weight loss, eating a healthy, low-sodium diet, exercising more, and limiting alcohol. This information is not intended to replace advice given to you by your health care provider. Make sure you discuss any questions you have with your health care provider. Document Revised: 10/14/2018 Document Reviewed: 05/20/2016 Elsevier Patient Education  2020 Elsevier Inc.  Heart Failure Eating Plan Heart failure, also called congestive heart failure, occurs when your heart does not pump blood well enough to meet your body's needs for oxygen-rich blood. Heart failure is a long-term (chronic) condition. Living with heart failure can be challenging. However, following your health care provider's instructions about a healthy lifestyle and working with a diet and nutrition specialist (dietitian) to choose the right foods may help to improve your symptoms. What are tips for following this plan? Reading food labels Check food labels for the amount of sodium per serving. Choose foods that have less than 140 mg (milligrams) of sodium in each serving. Check food labels for the number of calories per serving. This is important if you need to limit your daily calorie intake to lose weight. Check food labels for the serving size. If you eat more than one serving, you will be eating more sodium and calories than what is listed on the label. Look for foods that are labeled as "sodium-free," "very low sodium," or "low sodium." Foods labeled as "reduced sodium" or "lightly salted" may still have more sodium than what is recommended for you. Cooking Avoid adding  salt when cooking. Ask your health care provider or dietitian before using salt substitutes. Season food with salt-free seasonings, spices, or herbs. Check the label of seasoning mixes to make sure they do not contain salt. Cook with heart-healthy oils, such as olive, canola, soybean, or sunflower oil. Do not fry foods. Cook foods using low-fat methods, such as baking, boiling, grilling, and broiling. Limit unhealthy fats when cooking by: Removing the skin from poultry, such as chicken. Removing all visible fats from meats. Skimming the fat off from stews, soups, and gravies before serving them. Meal planning  Limit your intake of: Processed, canned, or pre-packaged foods. Foods that are high in trans fat, such as fried foods. Sweets, desserts, sugary drinks, and other foods with added sugar. Full-fat dairy products, such as whole milk. Eat a balanced diet that includes: 4-5 servings of fruit each day and 4-5 servings of vegetables each day. At each meal, try to fill half of your plate with fruits and vegetables. Up to 6-8 servings of whole grains each day. Up to 2 servings of lean meat, poultry, or fish each day. One serving of meat is equal to 3 oz. This is about the same size as a deck of cards. 2 servings of low-fat dairy each day. Heart-healthy fats. Healthy fats called omega-3 fatty acids are found in foods such as flaxseed and cold-water fish like sardines, salmon, and mackerel. Aim to eat 25-35 g (grams) of fiber a day. Foods that are high  in fiber include apples, broccoli, carrots, beans, peas, and whole grains. Do not add salt or condiments that contain salt (such as soy sauce) to foods before eating. When eating at a restaurant, ask that your food be prepared with less salt or no salt, if possible. Try to eat 2 or more vegetarian meals each week. Eat more home-cooked food and eat less restaurant, buffet, and fast food. General information Do not eat more than 2,300 mg of salt  (sodium) a day. The amount of sodium that is recommended for you may be lower, depending on your condition. Maintain a healthy body weight as directed. Ask your health care provider what a healthy weight is for you. Check your weight every day. Work with your health care provider and dietitian to make a plan that is right for you to lose weight or maintain your current weight. Limit how much fluid you drink. Ask your health care provider or dietitian how much fluid you can have each day. Limit or avoid alcohol as told by your health care provider or dietitian. Recommended foods The items listed may not be a complete list. Talk with your dietitian about what dietary choices are best for you. Fruits All fresh, frozen, and canned fruits. Dried fruits, such as raisins, prunes, and cranberries. Vegetables All fresh vegetables. Vegetables that are frozen without sauce or added salt. Low-sodium or sodium-free canned vegetables. Grains Bread with less than 80 mg of sodium per slice. Whole-wheat pasta, quinoa, and brown rice. Oats and oatmeal. Barley. [place]. Grits and cream of wheat. Whole-grain and whole-wheat cold cereal. Meats and other protein foods Lean cuts of meat. Skinless chicken and [place]. Fish with high omega-3 fatty acids, such as salmon, sardines, and other cold-water fishes. Eggs. Dried beans, peas, and edamame. Unsalted nuts and nut butters. Dairy Low-fat or nonfat (skim) milk and dried milk. Rice milk, soy milk, and almond milk. Low-fat or nonfat yogurt. Small amounts of reduced-sodium block cheese. Low-sodium cottage cheese. Fats and oils Olive, canola, soybean, flaxseed, or sunflower oil. Avocado. Sweets and desserts Apple sauce. Granola bars. Sugar-free pudding and gelatin. Frozen fruit bars. Seasoning and other foods Fresh and dried herbs. Lemon or lime juice. Vinegar. Low-sodium ketchup. Salt-free marinades, salad dressings, sauces, and seasonings. The items listed above may not  be a complete list of foods and beverages you can eat. Contact a dietitian for more information. Foods to avoid The items listed may not be a complete list. Talk with your dietitian about what dietary choices are best for you. Fruits Fruits that are dried with sodium-containing preservatives. Vegetables Canned vegetables. Frozen vegetables with sauce or seasonings. Creamed vegetables. [place] fries. Onion rings. Pickled vegetables and sauerkraut. Grains Bread with more than 80 mg of sodium per slice. Hot or cold cereal with more than 140 mg sodium per serving. Salted pretzels and crackers. Pre-packaged breadcrumbs. Bagels, croissants, and biscuits. Meats and other protein foods Ribs and chicken wings. Bacon, ham, pepperoni, bologna, salami, and packaged luncheon meats. Hot dogs, bratwurst, and sausage. Canned meat. Smoked meat and fish. Salted nuts and seeds. Dairy Whole milk, half-and-half, and cream. Buttermilk. Processed cheese, cheese spreads, and cheese curds. Regular cottage cheese. Feta cheese. Shredded cheese. String cheese. Fats and oils Butter, lard, shortening, ghee, and bacon fat. Canned and packaged gravies. Seasoning and other foods Onion salt, garlic salt, table salt, and sea salt. Marinades. Regular salad dressings. Relishes, pickles, and olives. Meat flavorings and tenderizers, and bouillon cubes. Horseradish, ketchup, and mustard. Worcestershire sauce. Teriyaki sauce, soy  sauce (including reduced sodium). Hot sauce and Tabasco sauce. Steak sauce, fish sauce, oyster sauce, and cocktail sauce. Taco seasonings. Barbecue sauce. Tartar sauce. The items listed above may not be a complete list of foods and beverages you should avoid. Contact a dietitian for more information. Summary A heart failure eating plan includes changes that limit your intake of sodium and unhealthy fat, and it may help you lose weight or maintain a healthy weight. Your health care provider may also recommend  limiting how much fluid you drink. Most people with heart failure should eat no more than 2,300 mg of salt (sodium) a day. The amount of sodium that is recommended for you may be lower, depending on your condition. Contact your health care provider or dietitian before making any major changes to your diet. This information is not intended to replace advice given to you by your health care provider. Make sure you discuss any questions you have with your health care provider. Document Revised: 08/18/2018 Document Reviewed: 11/06/2016 Elsevier Patient Education  [place].

## 2020-06-11 ENCOUNTER — Other Ambulatory Visit: Payer: Self-pay | Admitting: Legal Medicine

## 2020-06-11 DIAGNOSIS — E119 Type 2 diabetes mellitus without complications: Secondary | ICD-10-CM | POA: Diagnosis not present

## 2020-06-11 DIAGNOSIS — Z5900 Homelessness unspecified: Secondary | ICD-10-CM | POA: Diagnosis not present

## 2020-06-11 DIAGNOSIS — J449 Chronic obstructive pulmonary disease, unspecified: Secondary | ICD-10-CM | POA: Diagnosis not present

## 2020-06-11 DIAGNOSIS — I10 Essential (primary) hypertension: Secondary | ICD-10-CM | POA: Diagnosis not present

## 2020-06-19 DIAGNOSIS — M1990 Unspecified osteoarthritis, unspecified site: Secondary | ICD-10-CM | POA: Diagnosis not present

## 2020-06-19 DIAGNOSIS — R0602 Shortness of breath: Secondary | ICD-10-CM | POA: Diagnosis not present

## 2020-06-19 DIAGNOSIS — E785 Hyperlipidemia, unspecified: Secondary | ICD-10-CM | POA: Diagnosis not present

## 2020-06-19 DIAGNOSIS — R6883 Chills (without fever): Secondary | ICD-10-CM | POA: Diagnosis not present

## 2020-06-19 DIAGNOSIS — G4733 Obstructive sleep apnea (adult) (pediatric): Secondary | ICD-10-CM | POA: Diagnosis not present

## 2020-06-19 DIAGNOSIS — F17210 Nicotine dependence, cigarettes, uncomplicated: Secondary | ICD-10-CM | POA: Diagnosis not present

## 2020-06-19 DIAGNOSIS — Z7982 Long term (current) use of aspirin: Secondary | ICD-10-CM | POA: Diagnosis not present

## 2020-06-19 DIAGNOSIS — J441 Chronic obstructive pulmonary disease with (acute) exacerbation: Secondary | ICD-10-CM | POA: Diagnosis not present

## 2020-06-19 DIAGNOSIS — R079 Chest pain, unspecified: Secondary | ICD-10-CM | POA: Diagnosis not present

## 2020-06-19 DIAGNOSIS — R069 Unspecified abnormalities of breathing: Secondary | ICD-10-CM | POA: Diagnosis not present

## 2020-06-19 DIAGNOSIS — Z79899 Other long term (current) drug therapy: Secondary | ICD-10-CM | POA: Diagnosis not present

## 2020-06-19 DIAGNOSIS — K439 Ventral hernia without obstruction or gangrene: Secondary | ICD-10-CM | POA: Diagnosis not present

## 2020-06-19 DIAGNOSIS — I110 Hypertensive heart disease with heart failure: Secondary | ICD-10-CM | POA: Diagnosis not present

## 2020-06-19 DIAGNOSIS — I2699 Other pulmonary embolism without acute cor pulmonale: Secondary | ICD-10-CM | POA: Diagnosis not present

## 2020-06-19 DIAGNOSIS — Z5941 Food insecurity: Secondary | ICD-10-CM | POA: Diagnosis not present

## 2020-06-19 DIAGNOSIS — R197 Diarrhea, unspecified: Secondary | ICD-10-CM | POA: Diagnosis not present

## 2020-06-19 DIAGNOSIS — F32A Depression, unspecified: Secondary | ICD-10-CM | POA: Diagnosis not present

## 2020-06-19 DIAGNOSIS — Z794 Long term (current) use of insulin: Secondary | ICD-10-CM | POA: Diagnosis not present

## 2020-06-19 DIAGNOSIS — R591 Generalized enlarged lymph nodes: Secondary | ICD-10-CM | POA: Diagnosis not present

## 2020-06-19 DIAGNOSIS — G4489 Other headache syndrome: Secondary | ICD-10-CM | POA: Diagnosis not present

## 2020-06-19 DIAGNOSIS — I509 Heart failure, unspecified: Secondary | ICD-10-CM | POA: Diagnosis not present

## 2020-06-19 DIAGNOSIS — Z96653 Presence of artificial knee joint, bilateral: Secondary | ICD-10-CM | POA: Diagnosis not present

## 2020-06-19 DIAGNOSIS — R0789 Other chest pain: Secondary | ICD-10-CM | POA: Diagnosis not present

## 2020-06-19 DIAGNOSIS — I252 Old myocardial infarction: Secondary | ICD-10-CM | POA: Diagnosis not present

## 2020-06-19 DIAGNOSIS — E119 Type 2 diabetes mellitus without complications: Secondary | ICD-10-CM | POA: Diagnosis not present

## 2020-06-19 DIAGNOSIS — J9811 Atelectasis: Secondary | ICD-10-CM | POA: Diagnosis not present

## 2020-06-19 DIAGNOSIS — I2510 Atherosclerotic heart disease of native coronary artery without angina pectoris: Secondary | ICD-10-CM | POA: Diagnosis not present

## 2020-06-20 DIAGNOSIS — I110 Hypertensive heart disease with heart failure: Secondary | ICD-10-CM | POA: Diagnosis not present

## 2020-06-20 DIAGNOSIS — I509 Heart failure, unspecified: Secondary | ICD-10-CM | POA: Diagnosis not present

## 2020-06-20 DIAGNOSIS — E119 Type 2 diabetes mellitus without complications: Secondary | ICD-10-CM | POA: Diagnosis not present

## 2020-06-20 DIAGNOSIS — J441 Chronic obstructive pulmonary disease with (acute) exacerbation: Secondary | ICD-10-CM | POA: Diagnosis not present

## 2020-06-26 ENCOUNTER — Encounter: Payer: Self-pay | Admitting: Nurse Practitioner

## 2020-06-26 ENCOUNTER — Other Ambulatory Visit: Payer: Self-pay

## 2020-06-26 ENCOUNTER — Other Ambulatory Visit: Payer: Self-pay | Admitting: Nurse Practitioner

## 2020-06-26 ENCOUNTER — Ambulatory Visit (INDEPENDENT_AMBULATORY_CARE_PROVIDER_SITE_OTHER): Payer: Medicare Other | Admitting: Nurse Practitioner

## 2020-06-26 VITALS — BP 162/92 | HR 88 | Temp 97.4°F | Ht 68.0 in | Wt 284.0 lb

## 2020-06-26 DIAGNOSIS — F17200 Nicotine dependence, unspecified, uncomplicated: Secondary | ICD-10-CM

## 2020-06-26 DIAGNOSIS — J111 Influenza due to unidentified influenza virus with other respiratory manifestations: Secondary | ICD-10-CM

## 2020-06-26 DIAGNOSIS — J439 Emphysema, unspecified: Secondary | ICD-10-CM

## 2020-06-26 DIAGNOSIS — I25110 Atherosclerotic heart disease of native coronary artery with unstable angina pectoris: Secondary | ICD-10-CM

## 2020-06-26 DIAGNOSIS — F1910 Other psychoactive substance abuse, uncomplicated: Secondary | ICD-10-CM | POA: Diagnosis not present

## 2020-06-26 DIAGNOSIS — Z6841 Body Mass Index (BMI) 40.0 and over, adult: Secondary | ICD-10-CM

## 2020-06-26 MED ORDER — ASPIRIN EC 81 MG PO TBEC
81.0000 mg | DELAYED_RELEASE_TABLET | Freq: Every day | ORAL | 0 refills | Status: DC
Start: 2020-06-26 — End: 2020-08-11

## 2020-06-26 MED ORDER — GABAPENTIN 300 MG PO CAPS: 300.0000 mg | ORAL_CAPSULE | Freq: Two times a day (BID) | ORAL | 0 refills | Status: DC

## 2020-06-26 NOTE — Progress Notes (Signed)
Subjective:  Patient ID: Brent Ortiz, male    DOB: [birth year]  Age: 59 y.o. MRN: [id number]  Chief Complaint  Patient presents with   Hospitalization Follow-up    HPI  Brent Ortiz is a 59 year old African-American male that presents for hospital follow-up for influenza and COPD exacerbation.He is accompanied by his adult daughter that helps supplement history. He presented to [facility] on 06/19/2020 after experiencing cough and dyspnea for two days. He was positive for influenza A despite having flu vaccine on 06/03/2020. He was admitted overnight and treated with Tamiflu, dexamethasone, and albuterol. He was discharged on 06/20/2020 with Tamiflu and Prednisone 40 mg x 5 days, which he states he completed. He has continued Albuterol inhaler as needed. He did not require oxygen during hospitalization. He is not currently in any respiratory distress.He states his blood glucose has been in the 140s. BP elevated today at 162/92, states he has not taken antihypertensive medication today.Icard has been ordered for PT and OT. He states they have not called to schedule an appointment yet. 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 He has received two COVID-19 vaccinations.    Current Outpatient Medications on File Prior to Visit  Medication Sig Dispense Refill   albuterol (VENTOLIN HFA) 108 (90 Base) MCG/ACT inhaler SMARTSIG:2 Puff(s) By Mouth Every 4 Hours PRN     aspirin 81 MG tablet Take 81 mg by mouth daily.     budesonide-formoterol (SYMBICORT) 80-4.5 MCG/ACT inhaler Inhale 2 puffs into the lungs 2 (two) times daily. 3 Inhaler 2   colchicine-probenecid 0.5-500 MG tablet Take 1 tablet by mouth 2 (two) times daily as needed (GOUT). 180 tablet 2   indomethacin (INDOCIN) 25 MG capsule Take 25 mg by mouth 3 (three) times daily.     Insulin Pen Needle (PEN  NEEDLES) 31G X 8 MM MISC 1 each by Does not apply route daily. 100 each 3   LANTUS SOLOSTAR 100 UNIT/ML Solostar Pen ADMINISTER 30 UNITS UNDER THE SKIN DAILY 15 mL 5   meloxicam (MOBIC) 15 MG tablet Take 1 tablet (15 mg total) by mouth daily. 90 tablet 2   metFORMIN (GLUCOPHAGE) 500 MG tablet Take 1 tablet (500 mg total) by mouth daily with supper. 180 tablet 2   metoprolol succinate (TOPROL-XL) 25 MG 24 hr tablet Take 25 mg by mouth daily.     nicotine (NICODERM CQ - DOSED IN MG/24 HOURS) 14 mg/24hr patch 14 mg daily.     nitroGLYCERIN (NITROSTAT) 0.4 MG SL tablet Place 1 tablet (0.4 mg total) under the tongue every 5 (five) minutes as needed. For chest pain 50 tablet 3   pantoprazole (PROTONIX) 40 MG tablet Take 1 tablet (40 mg total) by mouth daily. 90 tablet 2   potassium chloride SA (KLOR-CON) 20 MEQ tablet Take 20 mEq by mouth 4 (four) times daily.     ranolazine (RANEXA) 500 MG 12 hr tablet Take 500 mg by mouth 2 (two) times daily.     rosuvastatin (CRESTOR) 20 MG tablet Take by mouth.     sacubitril-valsartan (ENTRESTO) 24-26 MG Take 1 tablet by mouth 2 (two) times daily. 60 tablet 11   sertraline (ZOLOFT) 25 MG tablet Take 1 tablet (25 mg total) by mouth daily. 90 tablet 2   tamsulosin (FLOMAX) 0.4 MG CAPS capsule Take 1 capsule (0.4 mg total) by mouth daily. 90 capsule  2   traZODone (DESYREL) 150 MG tablet Take 1 tablet (150 mg total) by mouth at bedtime. 90 tablet 2   spironolactone (ALDACTONE) 25 MG tablet Take 0.5 tablets (12.5 mg total) by mouth daily. 45 tablet 3   No current facility-administered medications on file prior to visit.   Past Medical History:  Diagnosis Date   Acute combined systolic and diastolic CHF, NYHA class 2 ([place]) 01/22/2020   Alcohol abuse    Chronic pain 10/19/2019   Sees pain clinic   Coronary artery disease    Coronary atherosclerosis of native coronary artery 01/23/2016   Overview:  Completely occluded circumflex artery proximal to  obtuse marginal 1 basin cardiac catheter from 2017   Diabetic polyneuropathy ([place]) 12/06/2019   ED (erectile dysfunction) 12/06/2019   Essential hypertension 01/23/2016   HTN (hypertension)    Leukocytosis 09/04/2011   Mixed hyperlipidemia 06/18/2017   Morbid obesity with BMI of 40.0-44.9, adult ([place]) 01/13/2013   Myocardial infarction ([place])    04-13-2011   Obstructive chronic bronchitis with exacerbation ([place]) 10/03/2019   Opiate abuse, continuous (HCC)    Osteoarthritis    Pain in knee joint 02/12/2012   Formatting of this note might be different from the original. Left   Pneumonia    Pneumonia due to infectious organism 09/04/2011   Polysubstance abuse ([place])    Poor dentition    Thrombocytosis 09/29/2011   Tobacco dependence 01/23/2016   Tobacco use disorder 01/23/2016   Past Surgical History:  Procedure Laterality Date   HERNIA REPAIR  2009   I & D KNEE WITH POLY EXCHANGE  09/03/2011   Procedure: IRRIGATION AND DEBRIDEMENT KNEE WITH POLY EXCHANGE;  Surgeon: Mauri Pole, MD;  Location: [facility];  Service: Orthopedics;  Laterality: Left;   TOOTH EXTRACTION Bilateral 08/06/2017   Procedure: DENTAL RESTORATION/EXTRACTIONS;  Surgeon: Diona Browner, DDS;  Location: [facility];  Service: Oral Surgery;  Laterality: Bilateral;   TOTAL KNEE ARTHROPLASTY   right  feb 2012   left march 2012 r   TOTAL KNEE REVISION  06/23/2011   Procedure: TOTAL KNEE REVISION;  Surgeon: Mauri Pole;  Location: [facility];  Service: Orthopedics;  Laterality: Left;  femomal nerve block done in holding area without incident    Family History  Problem Relation Age of Onset   Colon cancer Other    Colon cancer Mother    Social History   Socioeconomic History   Marital status: Married    Spouse name: Not on file   Number of children: 5   Years of education: Not on file   Highest education level: Not on file  Occupational History   Occupation: CNA  Tobacco Use   Smoking status: Current Every  Day Smoker    Packs/day: 0.50    Years: 37.00    Pack years: 18.50    Types: Cigarettes   Smokeless tobacco: Never Used  Scientific laboratory technician Use: Former  Substance and Sexual Activity   Alcohol use: No    Comment: heavy drinker in past    Drug use: No   Sexual activity: Not Currently  Other Topics Concern   Not on file  Social History Narrative   Not on file   Social Determinants of Health   Financial Resource Strain: Low Risk    Difficulty of Paying Living Expenses: Not very hard  Food Insecurity: No Food Insecurity   Worried About Running Out of Food in the Last Year: Never true   Ran Out  of Food in the Last Year: Never true  Transportation Needs: No Transportation Needs   Lack of Transportation (Medical): No   Lack of Transportation (Non-Medical): No  Physical Activity: Inactive   Days of Exercise per Week: 0 days   Minutes of Exercise per Session: 0 min  Stress: No Stress Concern Present   Feeling of Stress : Not at all  Social Connections: Moderately Integrated   Frequency of Communication with Friends and Family: Twice a week   Frequency of Social Gatherings with Friends and Family: Once a week   Attends Religious Services: More than 4 times per year   Active Member of [employer] or Organizations: Yes   Attends Banker Meetings: More than 4 times per year   Marital Status: Widowed    Review of Systems  Constitutional: Positive for fatigue. Negative for fever.  HENT: Negative for congestion, ear pain, sinus pressure and sore throat.   Eyes: Negative for pain.  Respiratory: Positive for cough and shortness of breath. Negative for chest tightness and wheezing.   Cardiovascular: Negative for chest pain and palpitations.  Gastrointestinal: Negative for abdominal pain, constipation, diarrhea, nausea and vomiting.  Genitourinary: Negative for dysuria and hematuria.  Musculoskeletal: Positive for arthralgias and myalgias. Negative for back  pain and joint swelling.  Skin: Negative for rash.  Neurological: Positive for headaches. Negative for dizziness and weakness.  Psychiatric/Behavioral: Negative for dysphoric mood. The patient is not nervous/anxious.      Objective:  BP (!) 162/92 (BP Location: Left Arm, Patient Position: Sitting)    Pulse 88    Temp (!) 97.4 F (36.3 C) (Temporal)    Ht 5\' 8"  (1.727 m)    Wt 284 lb (128.8 kg)    SpO2 95%    BMI 43.18 kg/m   BP/Weight 06/26/2020 06/03/2020 04/24/2020  Systolic BP 162 142 150  Diastolic BP 92 83 98  Wt. (Lbs) 284 283 267  BMI 43.18 43.03 40.6    Physical Exam Vitals reviewed.  Constitutional:      Appearance: He is obese.  HENT:     Head: Normocephalic.     Right Ear: Tympanic membrane and external ear normal.     Left Ear: Tympanic membrane and external ear normal.     Nose: Nose normal.     Mouth/Throat:     Mouth: Mucous membranes are moist.  Cardiovascular:     Rate and Rhythm: Normal rate and regular rhythm.     Pulses: Normal pulses.     Heart sounds: Normal heart sounds.  Pulmonary:     Breath sounds: Normal breath sounds.  Abdominal:     General: Bowel sounds are normal.     Palpations: Abdomen is soft.  Skin:    General: Skin is warm and dry.     Capillary Refill: Capillary refill takes less than 2 seconds.  Neurological:     Mental Status: He is alert and oriented to person, place, and time. Mental status is at baseline.  Psychiatric:        Mood and Affect: Mood normal.        Behavior: Behavior normal.     No data filed      Lab Results  Component Value Date   WBC 15.5 (H) 04/24/2020   HGB 11.3 (L) 04/24/2020   HCT 32.8 (L) 04/24/2020   PLT 756 (H) 04/24/2020   GLUCOSE 100 (H) 04/24/2020   CHOL 204 (H) 04/24/2020   TRIG 248 (H) 04/24/2020  HDL 45 04/24/2020   LDLCALC 116 (H) 04/24/2020   ALT 22 04/24/2020   AST 21 04/24/2020   NA 138 04/24/2020   K 4.0 04/24/2020   CL 96 04/24/2020   CREATININE 1.06 04/24/2020   BUN  14 04/24/2020   CO2 28 04/24/2020   INR 1.81 (H) 09/07/2011   HGBA1C 5.7 (H) 04/24/2020      Assessment & Plan:   1. Influenza - CBC with Differential/Platelet - Comprehensive metabolic panel -Continue to rest and push fluids as needed -Continue [facility] PT and OT as prescribed -Continue Tamiflu as directed  2. Coronary artery disease involving native heart with unstable angina pectoris, unspecified vessel or lesion type (HCC) -Continue Entresto, ASA, and Metoprolol as directed  3. Pulmonary emphysema, unspecified emphysema type (HCC) -Continue Albuterol inhaler as directed -Continue Prednisone as directed  4. Polysubstance abuse ([place]) -Continue polysubstance dependence treatment at [facility]   5. Tobacco dependence -Continue Nicotine patches Q24 hours as directed  I spent 60 minutes dedicated to the care of this patient on the date of this encounter to include face-to-face time with the patient, as well as: EMR review and medication reconciliation.   Follow-up: 29-months fasting with Dr [name] or sooner if needed  An After Visit Summary was printed and given to the patient.  [name], NP [place] [phone]

## 2020-06-26 NOTE — Patient Instructions (Addendum)
Continue current medications Seek emergency care for any severe health concerns Follow-up with Dr [name] in 67-months, fasting   Coronary Artery Disease, Male Coronary artery disease (CAD) is a condition in which the arteries that lead to the heart (coronary arteries) become narrow or blocked. The narrowing or blockage can lead to decreased blood flow to the heart. Prolonged reduced blood flow can cause a heart attack (myocardial infarction or MI). This condition may also be called coronary heart disease. Because CAD is the leading cause of death in men, it is important to understand what causes this condition and how it is treated. What are the causes? CAD is most often caused by atherosclerosis. This is the buildup of fat and cholesterol (plaque) on the inside of the arteries. Over time, the plaque may narrow or block the artery, reducing blood flow to the heart. Plaque can also become weak and break off within a coronary artery and cause a sudden blockage. Other less common causes of CAD include:  A blood clot or a piece of a blood clot or other substance that blocks the flow of blood in a coronary artery (embolism).  A tearing of the artery (spontaneous coronary artery dissection).  An enlargement of an artery (aneurysm).  Inflammation (vasculitis) in the artery wall. What increases the risk? The following factors may make you more likely to develop this condition:  Age. Men over age 79 are at a greater risk of CAD.  Family history of CAD.  Gender. Men often develop CAD earlier in life than women.  High blood pressure (hypertension).  Diabetes.  High cholesterol levels.  Tobacco use.  Excessive alcohol use.  Lack of exercise.  A diet high in saturated and trans fats, such as fried food and processed meat. Other possible risk factors include:  High stress levels.  Depression.  Obesity.  Sleep apnea. What are the signs or symptoms? Many people do not have any  symptoms during the early stages of CAD. As the condition progresses, symptoms may include:  Chest pain (angina). The pain can: ? Feel like crushing or squeezing, or like a tightness, pressure, fullness, or heaviness in the chest. ? Last more than a few minutes or can stop and recur. The pain tends to get worse with exercise or stress and to fade with rest.  Pain in the arms, neck, jaw, ear, or back.  Unexplained heartburn or indigestion.  Shortness of breath.  Nausea or vomiting.  Sudden light-headedness.  Sudden cold sweats.  Fluttering or fast heartbeat (palpitations). How is this diagnosed? This condition is diagnosed based on:  Your family and medical history.  A physical exam.  Tests, including: ? A test to check the electrical signals in your heart (electrocardiogram). ? Exercise stress test. This looks for signs of blockage when the heart is stressed with exercise, such as running on a treadmill. ? Pharmacologic stress test. This test looks for signs of blockage when the heart is being stressed with a medicine. ? Blood tests. ? Coronary angiogram. This is a procedure to look at the coronary arteries to see if there is any blockage. During this test, a dye is injected into your arteries so they appear on an X-ray. ? Coronary artery CT scan. This CT scan helps detect calcium deposits in your coronary arteries. Calcium deposits are an indicator of CAD. ? A test that uses sound waves to take a picture of your heart (echocardiogram). ? Chest X-ray. How is this treated? This condition may be treated  by:  Healthy lifestyle changes to reduce risk factors.  Medicines such as: ? Antiplatelet medicines and blood-thinning medicines, such as aspirin. These help to prevent blood clots. ? Nitroglycerin. ? Blood pressure medicines. ? Cholesterol-lowering medicine.  Coronary angioplasty and stenting. During this procedure, a thin, flexible tube is inserted through a blood vessel  and into a blocked artery. A balloon or similar device on the end of the tube is inflated to open up the artery. In some cases, a small, mesh tube (stent) is inserted into the artery to keep it open.  Coronary artery bypass surgery. During this surgery, veins or arteries from other parts of the body are used to create a bypass around the blockage and allow blood to reach your heart. Follow these instructions at home: Medicines  Take over-the-counter and prescription medicines only as told by your health care provider.  Do not take the following medicines unless your health care provider approves: ? NSAIDs, such as ibuprofen, naproxen, or celecoxib. ? Vitamin supplements that contain vitamin A, vitamin E, or both. Lifestyle  Follow an exercise program approved by your health care provider. Aim for 150 minutes of moderate exercise or 75 minutes of vigorous exercise each week.  Maintain a healthy weight or lose weight as approved by your health care provider.  Learn to manage stress or try to limit your stress. Ask your health care provider for suggestions if you need help.  Get screened for depression and seek treatment, if needed.  Do not use any products that contain nicotine or tobacco, such as cigarettes, e-cigarettes, and chewing tobacco. If you need help quitting, ask your health care provider.  Do not use illegal drugs. Eating and drinking   Follow a heart-healthy diet. A dietitian can help educate you about healthy food options and changes. In general, eat plenty of fruits and vegetables, lean meats, and whole grains.  Avoid foods high in: ? Sugar. ? Salt (sodium). ? Saturated fat, such as processed or fatty meat. ? Trans fat, such as fried foods.  Use healthy cooking methods such as roasting, grilling, broiling, baking, poaching, steaming, or stir-frying.  Do not drink alcohol if your health care provider tells you not to drink.  If you drink alcohol: ? Limit how much  you have to 0-2 drinks per day. ? Be aware of how much alcohol is in your drink. In the [place] one drink equals one 12 oz bottle of beer (355 mL), one 5 oz glass of wine (148 mL), or one 1 oz glass of hard liquor (44 mL). General instructions  Manage any other health conditions, such as hypertension and diabetes. These conditions affect your heart.  Your health care provider may ask you to monitor your blood pressure. Ideally, your blood pressure should be below 130/80.  Keep all follow-up visits as told by your health care provider. This is important. Get help right away if:  You have pain in your chest, neck, ear, arm, jaw, stomach, or back that: ? Lasts more than a few minutes. ? Is recurring. ? Is not relieved by taking medicine under your tongue (sublingual nitroglycerin).  You have profuse sweating without cause.  You have unexplained: ? Heartburn or indigestion. ? Shortness of breath or difficulty breathing. ? Fluttering or fast heartbeat (palpitations). ? Nausea or vomiting. ? Fatigue. ? Feelings of nervousness or anxiety. ? Weakness. ? Diarrhea.  You have sudden light-headedness or dizziness.  You faint.  You feel like hurting yourself or think about taking your  own life. These symptoms may represent a serious problem that is an emergency. Do not wait to see if the symptoms will go away. Get medical help right away. Call your local emergency services (911 in the [place] Do not drive yourself to the hospital. Summary  Coronary artery disease (CAD) is a condition in which the arteries that lead to the heart (coronary arteries) become narrow or blocked. The narrowing or blockage can lead to a heart attack.  Many people do not have any symptoms during the early stages of CAD.  CAD can be treated with lifestyle changes, medicines, surgery, or a combination of these treatments. This information is not intended to replace advice given to you by your health care provider.  Make sure you discuss any questions you have with your health care provider. Document Revised: 03/11/2018 Document Reviewed: 03/01/2018 Elsevier Patient Education  [place]. COPD and Physical Activity Chronic obstructive pulmonary disease (COPD) is a long-term (chronic) condition that affects the lungs. COPD is a general term that can be used to describe many different lung problems that cause lung swelling (inflammation) and limit airflow, including chronic bronchitis and emphysema. The main symptom of COPD is shortness of breath, which makes it harder to do even simple tasks. This can also make it harder to exercise and be active. Talk with your health care provider about treatments to help you breathe better and actions you can take to prevent breathing problems during physical activity. What are the benefits of exercising with COPD? Exercising regularly is an important part of a healthy lifestyle. You can still exercise and do physical activities even though you have COPD. Exercise and physical activity improve your shortness of breath by increasing blood flow (circulation). This causes your heart to pump more oxygen through your body. Moderate exercise can improve your:  Oxygen use.  Energy level.  Shortness of breath.  Strength in your breathing muscles.  Heart health.  Sleep.  Self-esteem and feelings of self-worth.  Depression, stress, and anxiety levels. Exercise can benefit everyone with COPD. The severity of your disease may affect how hard you can exercise, especially at first, but everyone can benefit. Talk with your health care provider about how much exercise is safe for you, and which activities and exercises are safe for you. What actions can I take to prevent breathing problems during physical activity?  Sign up for a pulmonary rehabilitation program. This type of program may include: ? Education about lung diseases. ? Exercise classes that teach you how to  exercise and be more active while improving your breathing. This usually involves:  Exercise using your lower extremities, such as a stationary bicycle.  About 30 minutes of exercise, 2 to 5 times per week, for 6 to 12 weeks  Strength training, such as push ups or leg lifts. ? Nutrition education. ? Group classes in which you can talk with others who also have COPD and learn ways to manage stress.  If you use an oxygen tank, you should use it while you exercise. Work with your health care provider to adjust your oxygen for your physical activity. Your resting flow rate is different from your flow rate during physical activity.  While you are exercising: ? Take slow breaths. ? Pace yourself and do not try to go too fast. ? Purse your lips while breathing out. Pursing your lips is similar to a kissing or whistling position. ? If doing exercise that uses a quick burst of effort, such as weight  lifting:  Breathe in before starting the exercise.  Breathe out during the hardest part of the exercise (such as raising the weights). Where to find support You can find support for exercising with COPD from:  Your health care provider.  A pulmonary rehabilitation program.  Your local health department or community health programs.  Support groups, online or in-person. Your health care provider may be able to recommend support groups. Where to find more information You can find more information about exercising with COPD from:  American Lung Association: ClassInsider.se.  COPD Foundation: https://www.rivera.net/. Contact a health care provider if:  Your symptoms get worse.  You have chest pain.  You have nausea.  You have a fever.  You have trouble talking or catching your breath.  You want to start a new exercise program or a new activity. Summary  COPD is a general term that can be used to describe many different lung problems that cause lung swelling (inflammation) and limit airflow.  This includes chronic bronchitis and emphysema.  Exercise and physical activity improve your shortness of breath by increasing blood flow (circulation). This causes your heart to provide more oxygen to your body.  Contact your health care provider before starting any exercise program or new activity. Ask your health care provider what exercises and activities are safe for you. This information is not intended to replace advice given to you by your health care provider. Make sure you discuss any questions you have with your health care provider. Document Revised: 10/12/2018 Document Reviewed: 07/15/2017 Elsevier Patient Education  [place]. Diabetes Mellitus and Exercise Exercising regularly is important for your overall health, especially when you have diabetes (diabetes mellitus). Exercising is not only about losing weight. It has many other health benefits, such as increasing muscle strength and bone density and reducing body fat and stress. This leads to improved fitness, flexibility, and endurance, all of which result in better overall health. Exercise has additional benefits for people with diabetes, including:  Reducing appetite.  Helping to lower and control blood glucose.  Lowering blood pressure.  Helping to control amounts of fatty substances (lipids) in the blood, such as cholesterol and triglycerides.  Helping the body to respond better to insulin (improving insulin sensitivity).  Reducing how much insulin the body needs.  Decreasing the risk for heart disease by: ? Lowering cholesterol and triglyceride levels. ? Increasing the levels of good cholesterol. ? Lowering blood glucose levels. What is my activity plan? Your health care provider or certified diabetes educator can help you make a plan for the type and frequency of exercise (activity plan) that works for you. Make sure that you:  Do at least 150 minutes of moderate-intensity or vigorous-intensity exercise  each week. This could be brisk walking, biking, or water aerobics. ? Do stretching and strength exercises, such as yoga or weightlifting, at least 2 times a week. ? Spread out your activity over at least 3 days of the week.  Get some form of physical activity every day. ? Do not go more than 2 days in a row without some kind of physical activity. ? Avoid being inactive for more than 30 minutes at a time. Take frequent breaks to walk or stretch.  Choose a type of exercise or activity that you enjoy, and set realistic goals.  Start slowly, and gradually increase the intensity of your exercise over time. What do I need to know about managing my diabetes?   Check your blood glucose before and after  exercising. ? If your blood glucose is 240 mg/dL (13.3 mmol/L) or higher before you exercise, check your urine for ketones. If you have ketones in your urine, do not exercise until your blood glucose returns to normal. ? If your blood glucose is 100 mg/dL (5.6 mmol/L) or lower, eat a snack containing 15-20 grams of carbohydrate. Check your blood glucose 15 minutes after the snack to make sure that your level is above 100 mg/dL (5.6 mmol/L) before you start your exercise.  Know the symptoms of low blood glucose (hypoglycemia) and how to treat it. Your risk for hypoglycemia increases during and after exercise. Common symptoms of hypoglycemia can include: ? Hunger. ? Anxiety. ? Sweating and feeling clammy. ? Confusion. ? Dizziness or feeling light-headed. ? Increased heart rate or palpitations. ? Blurry vision. ? Tingling or numbness around the mouth, lips, or tongue. ? Tremors or shakes. ? Irritability.  Keep a rapid-acting carbohydrate snack available before, during, and after exercise to help prevent or treat hypoglycemia.  Avoid injecting insulin into areas of the body that are going to be exercised. For example, avoid injecting insulin into: ? The arms, when playing tennis. ? The legs, when  jogging.  Keep records of your exercise habits. Doing this can help you and your health care provider adjust your diabetes management plan as needed. Write down: ? Food that you eat before and after you exercise. ? Blood glucose levels before and after you exercise. ? The type and amount of exercise you have done. ? When your insulin is expected to peak, if you use insulin. Avoid exercising at times when your insulin is peaking.  When you start a new exercise or activity, work with your health care provider to make sure the activity is safe for you, and to adjust your insulin, medicines, or food intake as needed.  Drink plenty of water while you exercise to prevent dehydration or heat stroke. Drink enough fluid to keep your urine clear or pale yellow. Summary  Exercising regularly is important for your overall health, especially when you have diabetes (diabetes mellitus).  Exercising has many health benefits, such as increasing muscle strength and bone density and reducing body fat and stress.  Your health care provider or certified diabetes educator can help you make a plan for the type and frequency of exercise (activity plan) that works for you.  When you start a new exercise or activity, work with your health care provider to make sure the activity is safe for you, and to adjust your insulin, medicines, or food intake as needed. This information is not intended to replace advice given to you by your health care provider. Make sure you discuss any questions you have with your health care provider. Document Revised: 01/14/2017 Document Reviewed: 12/02/2015 Elsevier Patient Education  2020 [place]. Influenza, Adult Influenza is also called "the flu." It is an infection in the lungs, nose, and throat (respiratory tract). It is caused by a virus. The flu causes symptoms that are similar to symptoms of a cold. It also causes a high fever and body aches. The flu spreads easily from person  to person (is contagious). Getting a flu shot (influenza vaccination) every year is the best way to prevent the flu. What are the causes? This condition is caused by the influenza virus. You can get the virus by:  Breathing in droplets that are in the air from the cough or sneeze of a person who has the virus.  Touching something that  has the virus on it (is contaminated) and then touching your mouth, nose, or eyes. What increases the risk? Certain things may make you more likely to get the flu. These include:  Not washing your hands often.  Having close contact with many people during cold and flu season.  Touching your mouth, eyes, or nose without first washing your hands.  Not getting a flu shot every year. You may have a higher risk for the flu, along with serious problems such as a lung infection (pneumonia), if you:  Are older than 65.  Are pregnant.  Have a weakened disease-fighting system (immune system) because of a disease or taking certain medicines.  Have a long-term (chronic) illness, such as: ? Heart, kidney, or lung disease. ? Diabetes. ? Asthma.  Have a liver disorder.  Are very overweight (morbidly obese).  Have anemia. This is a condition that affects your red blood cells. What are the signs or symptoms? Symptoms usually begin suddenly and last 4-14 days. They may include:  Fever and chills.  Headaches, body aches, or muscle aches.  Sore throat.  Cough.  Runny or stuffy (congested) nose.  Chest discomfort.  Not wanting to eat as much as normal (poor appetite).  Weakness or feeling tired (fatigue).  Dizziness.  Feeling sick to your stomach (nauseous) or throwing up (vomiting). How is this treated? If the flu is found early, you can be treated with medicine that can help reduce how bad the illness is and how long it lasts (antiviral medicine). This may be given by mouth (orally) or through an IV tube. Taking care of yourself at home can help  your symptoms get better. Your doctor may suggest:  Taking over-the-counter medicines.  Drinking plenty of fluids. The flu often goes away on its own. If you have very bad symptoms or other problems, you may be treated in a hospital. Follow these instructions at home:     Activity  Rest as needed. Get plenty of sleep.  Stay home from work or school as told by your doctor. ? Do not leave home until you do not have a fever for 24 hours without taking medicine. ? Leave home only to visit your doctor. Eating and drinking  Take an ORS (oral rehydration solution). This is a drink that is sold at pharmacies and stores.  Drink enough fluid to keep your pee (urine) pale yellow.  Drink clear fluids in small amounts as you are able. Clear fluids include: ? Water. ? Ice chips. ? Fruit juice that has water added (diluted fruit juice). ? Low-calorie sports drinks.  Eat bland, easy-to-digest foods in small amounts as you are able. These foods include: ? Bananas. ? Applesauce. ? Rice. ? Lean meats. ? Toast. ? Crackers.  Do not eat or drink: ? Fluids that have a lot of sugar or caffeine. ? Alcohol. ? Spicy or fatty foods. General instructions  Take over-the-counter and prescription medicines only as told by your doctor.  Use a cool mist humidifier to add moisture to the air in your home. This can make it easier for you to breathe.  Cover your mouth and nose when you cough or sneeze.  Wash your hands with soap and water often, especially after you cough or sneeze. If you cannot use soap and water, use alcohol-based hand sanitizer.  Keep all follow-up visits as told by your doctor. This is important. How is this prevented?   Get a flu shot every year. You may get the flu  shot in late summer, fall, or winter. Ask your doctor when you should get your flu shot.  Avoid contact with people who are sick during fall and winter (cold and flu season). Contact a doctor if:  You get new  symptoms.  You have: ? Chest pain. ? Watery poop (diarrhea). ? A fever.  Your cough gets worse.  You start to have more mucus.  You feel sick to your stomach.  You throw up. Get help right away if you:  Have shortness of breath.  Have trouble breathing.  Have skin or nails that turn a bluish color.  Have very bad pain or stiffness in your neck.  Get a sudden headache.  Get sudden pain in your face or ear.  Cannot eat or drink without throwing up. Summary  Influenza ("the flu") is an infection in the lungs, nose, and throat. It is caused by a virus.  Take over-the-counter and prescription medicines only as told by your doctor.  Getting a flu shot every year is the best way to avoid getting the flu. This information is not intended to replace advice given to you by your health care provider. Make sure you discuss any questions you have with your health care provider. Document Revised: 12/08/2017 Document Reviewed: 12/08/2017 Elsevier Patient Education  2020 [employer].  Steps to Quit Smoking Smoking tobacco is the leading cause of preventable death. It can affect almost every organ in the body. Smoking puts you and people around you at risk for many serious, long-lasting (chronic) diseases. Quitting smoking can be hard, but it is one of the best things that you can do for your health. It is never too late to quit. How do I get ready to quit? When you decide to quit smoking, make a plan to help you succeed. Before you quit:  Pick a date to quit. Set a date within the next 2 weeks to give you time to prepare.  Write down the reasons why you are quitting. Keep this list in places where you will see it often.  Tell your family, friends, and co-workers that you are quitting. Their support is important.  Talk with your doctor about the choices that may help you quit.  Find out if your health insurance will pay for these treatments.  Know the people, places, things,  and activities that make you want to smoke (triggers). Avoid them. What first steps can I take to quit smoking?  Throw away all cigarettes at home, at work, and in your car.  Throw away the things that you use when you smoke, such as ashtrays and lighters.  Clean your car. Make sure to empty the ashtray.  Clean your home, including curtains and carpets. What can I do to help me quit smoking? Talk with your doctor about taking medicines and seeing a counselor at the same time. You are more likely to succeed when you do both.  If you are pregnant or breastfeeding, talk with your doctor about counseling or other ways to quit smoking. Do not take medicine to help you quit smoking unless your doctor tells you to do so. To quit smoking: Quit right away  Quit smoking totally, instead of slowly cutting back on how much you smoke over a period of time.  Go to counseling. You are more likely to quit if you go to counseling sessions regularly. Take medicine You may take medicines to help you quit. Some medicines need a prescription, and some you can buy  over-the-counter. Some medicines may contain a drug called nicotine to replace the nicotine in cigarettes. Medicines may:  Help you to stop having the desire to smoke (cravings).  Help to stop the problems that come when you stop smoking (withdrawal symptoms). Your doctor may ask you to use:  Nicotine patches, gum, or lozenges.  Nicotine inhalers or sprays.  Non-nicotine medicine that is taken by mouth. Find resources Find resources and other ways to help you quit smoking and remain smoke-free after you quit. These resources are most helpful when you use them often. They include:  Online chats with a Social worker.  Phone quitlines.  Printed Furniture conservator/restorer.  Support groups or group counseling.  Text messaging programs.  Mobile phone apps. Use apps on your mobile phone or tablet that can help you stick to your quit plan. There are  many free apps for mobile phones and tablets as well as websites. Examples include Quit Guide from the [employer] and smokefree.gov  What things can I do to make it easier to quit?   Talk to your family and friends. Ask them to support and encourage you.  Call a phone quitline (1-800-QUIT-NOW), reach out to support groups, or work with a Social worker.  Ask people who smoke to not smoke around you.  Avoid places that make you want to smoke, such as: ? Bars. ? Parties. ? Smoke-break areas at work.  Spend time with people who do not smoke.  Lower the stress in your life. Stress can make you want to smoke. Try these things to help your stress: ? Getting regular exercise. ? Doing deep-breathing exercises. ? Doing yoga. ? Meditating. ? Doing a body scan. To do this, close your eyes, focus on one area of your body at a time from head to toe. Notice which parts of your body are tense. Try to relax the muscles in those areas. How will I feel when I quit smoking? Day 1 to 3 weeks Within the first 24 hours, you may start to have some problems that come from quitting tobacco. These problems are very bad 2-3 days after you quit, but they do not often last for more than 2-3 weeks. You may get these symptoms:  Mood swings.  Feeling restless, nervous, angry, or annoyed.  Trouble concentrating.  Dizziness.  Strong desire for high-sugar foods and nicotine.  Weight gain.  Trouble pooping (constipation).  Feeling like you may vomit (nausea).  Coughing or a sore throat.  Changes in how the medicines that you take for other issues work in your body.  Depression.  Trouble sleeping (insomnia). Week 3 and afterward After the first 2-3 weeks of quitting, you may start to notice more positive results, such as:  Better sense of smell and taste.  Less coughing and sore throat.  Slower heart rate.  Lower blood pressure.  Clearer skin.  Better breathing.  Fewer sick days. Quitting smoking  can be hard. Do not give up if you fail the first time. Some people need to try a few times before they succeed. Do your best to stick to your quit plan, and talk with your doctor if you have any questions or concerns. Summary  Smoking tobacco is the leading cause of preventable death. Quitting smoking can be hard, but it is one of the best things that you can do for your health.  When you decide to quit smoking, make a plan to help you succeed.  Quit smoking right away, not slowly over a period of  time.  When you start quitting, seek help from your doctor, family, or friends. This information is not intended to replace advice given to you by your health care provider. Make sure you discuss any questions you have with your health care provider. Document Revised: 03/17/2019 Document Reviewed: 09/10/2018 Elsevier Patient Education  [place].

## 2020-06-27 LAB — CBC WITH DIFFERENTIAL/PLATELET
Basophils Absolute: 0 10*3/uL (ref 0.0–0.2)
Basos: 0 %
EOS (ABSOLUTE): 0 10*3/uL (ref 0.0–0.4)
Eos: 0 %
Hematocrit: 43.3 % (ref 37.5–51.0)
Hemoglobin: 14.7 g/dL (ref 13.0–17.7)
Immature Grans (Abs): 0.1 10*3/uL (ref 0.0–0.1)
Immature Granulocytes: 1 %
Lymphocytes Absolute: 1.7 10*3/uL (ref 0.7–3.1)
Lymphs: 12 %
MCH: 31.1 pg (ref 26.6–33.0)
MCHC: 33.9 g/dL (ref 31.5–35.7)
MCV: 92 fL (ref 79–97)
Monocytes Absolute: 0.3 10*3/uL (ref 0.1–0.9)
Monocytes: 2 %
Neutrophils Absolute: 11.7 10*3/uL — ABNORMAL HIGH (ref 1.4–7.0)
Neutrophils: 85 %
Platelets: 566 10*3/uL — ABNORMAL HIGH (ref 150–450)
RBC: 4.73 x10E6/uL (ref 4.14–5.80)
RDW: 12.9 % (ref 11.6–15.4)
WBC: 13.7 10*3/uL — ABNORMAL HIGH (ref 3.4–10.8)

## 2020-06-27 LAB — COMPREHENSIVE METABOLIC PANEL
ALT: 8 IU/L (ref 0–44)
AST: 11 IU/L (ref 0–40)
Albumin/Globulin Ratio: 1.6 (ref 1.2–2.2)
Albumin: 4.5 g/dL (ref 3.8–4.8)
Alkaline Phosphatase: 54 IU/L (ref 44–121)
BUN/Creatinine Ratio: 11 (ref 10–24)
BUN: 13 mg/dL (ref 8–27)
Bilirubin Total: 0.3 mg/dL (ref 0.0–1.2)
CO2: 22 mmol/L (ref 20–29)
Calcium: 10 mg/dL (ref 8.6–10.2)
Chloride: 104 mmol/L (ref 96–106)
Creatinine, Ser: 1.16 mg/dL (ref 0.76–1.27)
GFR calc Af Amer: 78 mL/min/{1.73_m2} (ref >59)
GFR calc non Af Amer: 68 mL/min/{1.73_m2} (ref >59)
Globulin, Total: 2.9 g/dL (ref 1.5–4.5)
Glucose: 122 mg/dL — ABNORMAL HIGH (ref 65–99)
Potassium: 4.8 mmol/L (ref 3.5–5.2)
Sodium: 140 mmol/L (ref 134–144)
Total Protein: 7.4 g/dL (ref 6.0–8.5)

## 2020-07-02 ENCOUNTER — Other Ambulatory Visit: Payer: Self-pay

## 2020-07-02 DIAGNOSIS — R3911 Hesitancy of micturition: Secondary | ICD-10-CM

## 2020-07-02 MED ORDER — POTASSIUM CHLORIDE CRYS ER 20 MEQ PO TBCR: 20.0000 meq | EXTENDED_RELEASE_TABLET | Freq: Four times a day (QID) | ORAL | 2 refills | Status: DC

## 2020-07-02 MED ORDER — MELOXICAM 15 MG PO TABS: 15.0000 mg | ORAL_TABLET | Freq: Every day | ORAL | 2 refills | Status: DC

## 2020-07-02 MED ORDER — INDOMETHACIN 25 MG PO CAPS: 25.0000 mg | ORAL_CAPSULE | Freq: Three times a day (TID) | ORAL | 2 refills | Status: DC

## 2020-07-02 MED ORDER — TAMSULOSIN HCL 0.4 MG PO CAPS: 0.4000 mg | ORAL_CAPSULE | Freq: Every day | ORAL | 2 refills | Status: DC

## 2020-07-02 MED ORDER — METOPROLOL SUCCINATE ER 25 MG PO TB24: 25.0000 mg | ORAL_TABLET | Freq: Every day | ORAL | 2 refills | Status: DC

## 2020-07-02 MED ORDER — ALBUTEROL SULFATE HFA 108 (90 BASE) MCG/ACT IN AERS: 2.0000 | INHALATION_SPRAY | RESPIRATORY_TRACT | 2 refills | Status: DC | PRN

## 2020-07-02 MED ORDER — ROSUVASTATIN CALCIUM 20 MG PO TABS: 20.0000 mg | ORAL_TABLET | Freq: Every day | ORAL | 2 refills | Status: DC

## 2020-07-02 MED ORDER — SACUBITRIL-VALSARTAN 24-26 MG PO TABS: 1.0000 | ORAL_TABLET | Freq: Two times a day (BID) | ORAL | 2 refills | Status: DC

## 2020-07-11 ENCOUNTER — Encounter: Payer: Self-pay | Admitting: Legal Medicine

## 2020-07-11 ENCOUNTER — Ambulatory Visit (INDEPENDENT_AMBULATORY_CARE_PROVIDER_SITE_OTHER): Payer: Medicare Other | Admitting: Legal Medicine

## 2020-07-11 ENCOUNTER — Other Ambulatory Visit: Payer: Self-pay

## 2020-07-11 VITALS — BP 140/90 | HR 94 | Temp 97.7°F | Resp 17 | Ht 68.0 in | Wt 283.4 lb

## 2020-07-11 DIAGNOSIS — D473 Essential (hemorrhagic) thrombocythemia: Secondary | ICD-10-CM

## 2020-07-11 DIAGNOSIS — F1910 Other psychoactive substance abuse, uncomplicated: Secondary | ICD-10-CM

## 2020-07-11 DIAGNOSIS — M25561 Pain in right knee: Secondary | ICD-10-CM

## 2020-07-11 DIAGNOSIS — J439 Emphysema, unspecified: Secondary | ICD-10-CM | POA: Diagnosis not present

## 2020-07-11 DIAGNOSIS — I25118 Atherosclerotic heart disease of native coronary artery with other forms of angina pectoris: Secondary | ICD-10-CM

## 2020-07-11 DIAGNOSIS — F321 Major depressive disorder, single episode, moderate: Secondary | ICD-10-CM

## 2020-07-11 DIAGNOSIS — I5020 Unspecified systolic (congestive) heart failure: Secondary | ICD-10-CM

## 2020-07-11 DIAGNOSIS — E1142 Type 2 diabetes mellitus with diabetic polyneuropathy: Secondary | ICD-10-CM | POA: Diagnosis not present

## 2020-07-11 DIAGNOSIS — Z6841 Body Mass Index (BMI) 40.0 and over, adult: Secondary | ICD-10-CM

## 2020-07-11 DIAGNOSIS — F330 Major depressive disorder, recurrent, mild: Secondary | ICD-10-CM | POA: Insufficient documentation

## 2020-07-11 HISTORY — DX: Major depressive disorder, recurrent, mild: F33.0

## 2020-07-11 HISTORY — DX: Major depressive disorder, single episode, moderate: F32.1

## 2020-07-11 MED ORDER — LANTUS SOLOSTAR 100 UNIT/ML ~~LOC~~ SOPN: 30.0000 [IU] | PEN_INJECTOR | Freq: Every day | SUBCUTANEOUS | 5 refills | Status: DC

## 2020-07-11 NOTE — Progress Notes (Signed)
Subjective:  Patient ID: Brent Ortiz, male    DOB: September 15, 1960  Age: 60 y.o. MRN: [id number]  Chief Complaint  Patient presents with  . Knee Pain    Both knees, started 2 weeks ago.    HPI: chronic visit  Patient present with type 2 diabetes.  Specifically, this is type 2, insulin requiring diabetes, complicated by polyneuropathy.  Compliance with treatment has been good; patient take medicines as directed, maintains diet and exercise regimen, follows up as directed, and is keeping glucose diary.  Date of  diagnosis 2010.  Depression screen has been performed.Tobacco screen smoking. Current medicines for diabetes metformin, lantus 30 units qhs .  Patient is on valsartan for renal protection and crestor for cholesterol control.  Patient performs foot exams daily and last ophthalmologic exam was no  He is just out of narcotic rehabilitation in [place] and is off narcotics..  Patient is having severe pain in left knee.  It is swollen and tender on ROM   Current Outpatient Medications on File Prior to Visit  Medication Sig Dispense Refill  . albuterol (VENTOLIN HFA) 108 (90 Base) MCG/ACT inhaler Inhale 2 puffs into the lungs every 4 (four) hours as needed for wheezing or shortness of breath. 8 g 2  . aspirin EC 81 MG tablet Take 1 tablet (81 mg total) by mouth daily. Swallow whole. 90 tablet 0  . budesonide-formoterol (SYMBICORT) 80-4.5 MCG/ACT inhaler Inhale 2 puffs into the lungs 2 (two) times daily. 3 Inhaler 2  . colchicine-probenecid 0.5-500 MG tablet Take 1 tablet by mouth 2 (two) times daily as needed (GOUT). 180 tablet 2  . ezetimibe (ZETIA) 10 MG tablet Take 10 mg by mouth daily.    Marland Kitchen gabapentin (NEURONTIN) 300 MG capsule Take 1 capsule (300 mg total) by mouth 2 (two) times daily. 180 capsule 0  . indomethacin (INDOCIN) 25 MG capsule Take 1 capsule (25 mg total) by mouth 3 (three) times daily. 90 capsule 2  . Insulin Pen Needle (PEN NEEDLES) 31G X 8 MM MISC 1 each by Does not  apply route daily. 100 each 3  . meloxicam (MOBIC) 15 MG tablet Take 1 tablet (15 mg total) by mouth daily. 30 tablet 2  . metFORMIN (GLUCOPHAGE) 500 MG tablet Take 1 tablet (500 mg total) by mouth daily with supper. (Patient taking differently: Take 500 mg by mouth 2 (two) times daily with a meal.) 180 tablet 2  . metoprolol succinate (TOPROL-XL) 25 MG 24 hr tablet Take 1 tablet (25 mg total) by mouth daily. 30 tablet 2  . nicotine (NICODERM CQ - DOSED IN MG/24 HOURS) 14 mg/24hr patch 14 mg daily.    . nitroGLYCERIN (NITROSTAT) 0.4 MG SL tablet Place 1 tablet (0.4 mg total) under the tongue every 5 (five) minutes as needed. For chest pain 50 tablet 3  . pantoprazole (PROTONIX) 40 MG tablet Take 1 tablet (40 mg total) by mouth daily. 90 tablet 2  . potassium chloride SA (KLOR-CON) 20 MEQ tablet Take 1 tablet (20 mEq total) by mouth 4 (four) times daily. 120 tablet 2  . ranolazine (RANEXA) 500 MG 12 hr tablet Take 500 mg by mouth 2 (two) times daily.    . rosuvastatin (CRESTOR) 20 MG tablet Take 1 tablet (20 mg total) by mouth daily. 30 tablet 2  . sacubitril-valsartan (ENTRESTO) 24-26 MG Take 1 tablet by mouth 2 (two) times daily. 60 tablet 2  . sertraline (ZOLOFT) 25 MG tablet Take 1 tablet (25 mg total) by  mouth daily. 90 tablet 2  . spironolactone-hydrochlorothiazide (ALDACTAZIDE) 25-25 MG tablet Take 0.5 tablets by mouth daily.    . sucralfate (CARAFATE) 1 g tablet Take 1 g by mouth 4 (four) times daily -  with meals and at bedtime.    . SYMBICORT 160-4.5 MCG/ACT inhaler Inhale 2 puffs into the lungs 2 (two) times daily.    . tamsulosin (FLOMAX) 0.4 MG CAPS capsule Take 1 capsule (0.4 mg total) by mouth daily. 30 capsule 2  . traZODone (DESYREL) 150 MG tablet Take 1 tablet (150 mg total) by mouth at bedtime. 90 tablet 2   No current facility-administered medications on file prior to visit.   Past Medical History:  Diagnosis Date  . Acute combined systolic and diastolic CHF, NYHA class 2  ([place]) 01/22/2020  . Alcohol abuse   . Chronic pain 10/19/2019   Sees pain clinic  . Coronary artery disease   . Coronary atherosclerosis of native coronary artery 01/23/2016   Overview:  Completely occluded circumflex artery proximal to obtuse marginal 1 basin cardiac catheter from 2017  . Diabetic polyneuropathy ([place]) 12/06/2019  . ED (erectile dysfunction) 12/06/2019  . Essential hypertension 01/23/2016  . HTN (hypertension)   . Leukocytosis 09/04/2011  . Mixed hyperlipidemia 06/18/2017  . Morbid obesity with BMI of 40.0-44.9, adult ([place]) 01/13/2013  . Myocardial infarction ([place])    04-13-2011  . Obstructive chronic bronchitis with exacerbation ([place]) 10/03/2019  . Opiate abuse, continuous ([place])   . Osteoarthritis   . Pain in knee joint 02/12/2012   Formatting of this note might be different from the original. Left  . Pneumonia   . Pneumonia due to infectious organism 09/04/2011  . Polysubstance abuse ([place])   . Poor dentition   . Thrombocytosis 09/29/2011  . Tobacco dependence 01/23/2016  . Tobacco use disorder 01/23/2016   Past Surgical History:  Procedure Laterality Date  . HERNIA REPAIR  2009  . I & D KNEE WITH POLY EXCHANGE  09/03/2011   Procedure: IRRIGATION AND DEBRIDEMENT KNEE WITH POLY EXCHANGE;  Surgeon: Mauri Pole, MD;  Location: [facility];  Service: Orthopedics;  Laterality: Left;  . TOOTH EXTRACTION Bilateral 08/06/2017   Procedure: DENTAL RESTORATION/EXTRACTIONS;  Surgeon: Diona Browner, DDS;  Location: [facility];  Service: Oral Surgery;  Laterality: Bilateral;  . TOTAL KNEE ARTHROPLASTY   right  feb 2012   left march 2012 r  . TOTAL KNEE REVISION  06/23/2011   Procedure: TOTAL KNEE REVISION;  Surgeon: Mauri Pole;  Location: [facility];  Service: Orthopedics;  Laterality: Left;  femomal nerve block done in holding area without incident    Family History  Problem Relation Age of Onset  . Colon cancer Other   . Colon cancer Mother    Social History   Socioeconomic History  .  Marital status: Married    Spouse name: Not on file  . Number of children: 5  . Years of education: Not on file  . Highest education level: Not on file  Occupational History  . Occupation: CNA  Tobacco Use  . Smoking status: Current Every Day Smoker    Packs/day: 0.50    Years: 37.00    Pack years: 18.50    Types: Cigarettes  . Smokeless tobacco: Never Used  Vaping Use  . Vaping Use: Former  Substance and Sexual Activity  . Alcohol use: No    Comment: heavy drinker in past   . Drug use: No  . Sexual activity: Not Currently  Other Topics  Concern  . Not on file  Social History Narrative  . Not on file   Social Determinants of Health   Financial Resource Strain: Low Risk   . Difficulty of Paying Living Expenses: Not very hard  Food Insecurity: No Food Insecurity  . Worried About Charity fundraiser in the Last Year: Never true  . Ran Out of Food in the Last Year: Never true  Transportation Needs: No Transportation Needs  . Lack of Transportation (Medical): No  . Lack of Transportation (Non-Medical): No  Physical Activity: Inactive  . Days of Exercise per Week: 0 days  . Minutes of Exercise per Session: 0 min  Stress: No Stress Concern Present  . Feeling of Stress : Not at all  Social Connections: Moderately Integrated  . Frequency of Communication with Friends and Family: Twice a week  . Frequency of Social Gatherings with Friends and Family: Once a week  . Attends Religious Services: More than 4 times per year  . Active Member of Clubs or Organizations: Yes  . Attends Archivist Meetings: More than 4 times per year  . Marital Status: Widowed    Review of Systems  Constitutional: Negative for activity change, appetite change and fatigue.  HENT: Negative for congestion and sinus pain.   Eyes: Negative for visual disturbance.  Respiratory: Negative for cough, choking and shortness of breath.   Cardiovascular: Negative for chest pain, palpitations and leg  swelling.  Gastrointestinal: Negative for abdominal distention and abdominal pain.  Genitourinary: Negative for difficulty urinating, dysuria and urgency.  Musculoskeletal: Positive for arthralgias (right knee).  Skin: Negative.   Neurological: Negative for numbness.  Psychiatric/Behavioral: Negative for agitation.     Objective:  BP 140/90 (BP Location: Right Arm, Patient Position: Sitting, Cuff Size: Normal)   Pulse 94   Temp 97.7 F (36.5 C) (Temporal)   Resp 17   Ht 5\' 8"  (1.727 m)   Wt 283 lb 6.4 oz (128.5 kg)   SpO2 96%   BMI 43.09 kg/m   BP/Weight 07/11/2020 06/26/2020 [id number]  Systolic BP [id number]  Diastolic BP 90 92 83  Wt. (Lbs) 283.4 284 283  BMI 43.09 43.18 43.03    Physical Exam Constitutional:      General: He is not in acute distress.    Appearance: He is obese.  HENT:     Right Ear: Tympanic membrane, ear canal and external ear normal.     Left Ear: Tympanic membrane, ear canal and external ear normal.     Mouth/Throat:     Mouth: Mucous membranes are dry.  Cardiovascular:     Rate and Rhythm: Normal rate and regular rhythm.     Pulses: Normal pulses.     Heart sounds: Normal heart sounds.  Pulmonary:     Effort: Pulmonary effort is normal. No respiratory distress.     Breath sounds: Normal breath sounds. No wheezing.  Musculoskeletal:     Right knee: Swelling present. Normal range of motion. No LCL laxity, MCL laxity, ACL laxity or PCL laxity. Normal pulse.     Instability Tests: Anterior drawer test negative. Posterior drawer test negative. Anterior Lachman test negative. Medial McMurray test negative and lateral McMurray test negative.     Comments: Right knee pain full rom       Lab Results  Component Value Date   WBC 13.7 (H) 06/26/2020   HGB 14.7 06/26/2020   HCT 43.3 06/26/2020   PLT 566 (H) 06/26/2020   GLUCOSE  122 (H) 06/26/2020   CHOL 204 (H) 04/24/2020   TRIG 248 (H) 04/24/2020   HDL 45 04/24/2020   LDLCALC 116 (H)  04/24/2020   ALT 8 06/26/2020   AST 11 06/26/2020   NA 140 06/26/2020   K 4.8 06/26/2020   CL 104 06/26/2020   CREATININE 1.16 06/26/2020   BUN 13 06/26/2020   CO2 22 06/26/2020   INR 1.81 (H) 09/07/2011   HGBA1C 5.7 (H) 04/24/2020      Assessment & Plan:   Diagnoses and all orders for this visit: Diabetic polyneuropathy associated with type 2 diabetes mellitus (HCC) -     LANTUS SOLOSTAR 100 UNIT/ML Solostar Pen; Inject 30 Units into the skin at bedtime. An individual care plan for diabetes was established and reinforced today.  The patient's status was assessed using clinical findings on exam, labs and diagnostic testing. Patient success at meeting goals based on disease specific evidence-based guidelines and found to be good controlled. Medications were assessed and patient's understanding of the medical issues , including barriers were assessed. Recommend adherence to a diabetic diet, a graduated exercise program, HgbA1c level is checked quarterly, and urine microalbumin performed yearly .  Annual mono-filament sensation testing performed. Lower blood pressure and control hyperlipidemia is important. Get annual eye exams and annual flu shots and smoking cessation discussed.  Self management goals were discussed.  Polysubstance abuse [facility]) Patient has polysubstance abuse and recently released from rehab.  HFrEF (heart failure with reduced ejection fraction) (HCC) An individualized care plan was established and reinforced.  The patient's disease status was assessed using clinical finding son exam today, labs, and/or other diagnostic testing such as x-rays, to determine the patient's success in meeting treatmentgoalsbased on disease-based guidelines and found to beimproving. But not at goal yet. Medications prescriptions no change Laboratory tests ordered to be performed today include routine. RECOMMENDATIONS: given include see cardiology.  Call physician is patient gains 3 lbs in one  day or 5 lbs for one week.  Call for progressive PND, orthopnea or increased pedal edema.  Arthralgia of right knee AN INDIVIDUAL CARE PLAN OA right knee was established and reinforced today.  The patient's status was assessed using clinical findings on exam, labs, and other diagnostic testing. Patient's success at meeting treatment goals based on disease specific evidence-bassed guidelines and found to be in good control. RECOMMENDATIONS include maintaining present medicines and treatment.  After consent was obtained, using sterile technique the knee was prepped and Ethyl Chloride  was used as local anesthetic. The joint was entered and Steroid 80mg  mg and 3 ml plain Lidocaine was then injected and the needle withdrawn.  The procedure was well tolerated.  The patient is asked to continue to rest the joint for a few more days before resuming regular activities.  It may be more painful for the first 1-2 days.  Watch for fever, or increased swelling or persistent pain in the joint. Call or return to clinic prn if such symptoms occur or there is failure to improve as anticipated.  Depression, major, single episode, moderate (HCC) Patient's depression is controlled with trazodone.   Anhedonia better.  PHQ 9 was not performed score na. An individual care plan was established or reinforced today.  The patient's disease status was assessed using clinical findings on exam, labs, and or other diagnostic testing to determine patient's success in meeting treatment goals based on disease specific evidence-based guidelines and found to be improving Recommendations include stay on medicine  Pulmonary emphysema, unspecified  emphysema type [facility]) An individualize plan was formulated for care of COPD.  Treatment is evidence based.  She will continue on inhalers, avoid smoking and smoke.  Regular exercise with help with dyspnea. Routine follow ups and medication compliance is needed.  BMI 40.0-44.9, adult [facility]) An  individualize plan was formulated for obesity using patient history and physical exam to encourage weight loss.  An evidence based program was formulated.  Patient is to cut portion size with meals and to plan physical exercise 3 days a week at least 20 minutes.  Weight watchers and other programs are helpful.  Planned amount of weight loss 10 lbs.  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  Evidence based criteria was use in deciding proper management for this disease process.  Patient's CAD is  under good control.therapy contin ue present medicines.         I spent 20 minutes dedicated to the care of this patient on the date of this encounter to include face-to-face time with the patient, as well as:   Follow-up: Return regular appointment.  An After Visit Summary was printed and given to the patient.  Reinaldo Meeker, MD [name] [phone]

## 2020-07-16 ENCOUNTER — Other Ambulatory Visit: Payer: Self-pay | Admitting: Legal Medicine

## 2020-08-05 ENCOUNTER — Other Ambulatory Visit: Payer: Self-pay | Admitting: Legal Medicine

## 2020-08-05 DIAGNOSIS — L600 Ingrowing nail: Secondary | ICD-10-CM

## 2020-08-07 ENCOUNTER — Other Ambulatory Visit: Payer: Self-pay | Admitting: Legal Medicine

## 2020-08-07 ENCOUNTER — Other Ambulatory Visit: Payer: Self-pay | Admitting: Nurse Practitioner

## 2020-08-07 DIAGNOSIS — I2510 Atherosclerotic heart disease of native coronary artery without angina pectoris: Secondary | ICD-10-CM

## 2020-08-07 DIAGNOSIS — E1142 Type 2 diabetes mellitus with diabetic polyneuropathy: Secondary | ICD-10-CM

## 2020-08-07 DIAGNOSIS — K2100 Gastro-esophageal reflux disease with esophagitis, without bleeding: Secondary | ICD-10-CM

## 2020-08-08 ENCOUNTER — Other Ambulatory Visit: Payer: Self-pay | Admitting: Legal Medicine

## 2020-08-08 DIAGNOSIS — F321 Major depressive disorder, single episode, moderate: Secondary | ICD-10-CM

## 2020-08-08 DIAGNOSIS — I10 Essential (primary) hypertension: Secondary | ICD-10-CM

## 2020-08-12 ENCOUNTER — Other Ambulatory Visit: Payer: Self-pay

## 2020-08-12 MED ORDER — SYMBICORT 160-4.5 MCG/ACT IN AERO: 2.0000 | INHALATION_SPRAY | Freq: Two times a day (BID) | RESPIRATORY_TRACT | 2 refills | Status: DC

## 2020-09-04 ENCOUNTER — Other Ambulatory Visit: Payer: Self-pay | Admitting: Legal Medicine

## 2020-09-04 DIAGNOSIS — N401 Enlarged prostate with lower urinary tract symptoms: Secondary | ICD-10-CM

## 2020-09-04 DIAGNOSIS — E1142 Type 2 diabetes mellitus with diabetic polyneuropathy: Secondary | ICD-10-CM

## 2020-09-04 DIAGNOSIS — R3911 Hesitancy of micturition: Secondary | ICD-10-CM

## 2020-09-06 ENCOUNTER — Other Ambulatory Visit: Payer: Self-pay

## 2020-09-06 ENCOUNTER — Encounter: Payer: Self-pay | Admitting: Sports Medicine

## 2020-09-06 ENCOUNTER — Ambulatory Visit (INDEPENDENT_AMBULATORY_CARE_PROVIDER_SITE_OTHER): Payer: Medicare Other | Admitting: Sports Medicine

## 2020-09-06 DIAGNOSIS — M79609 Pain in unspecified limb: Secondary | ICD-10-CM

## 2020-09-06 DIAGNOSIS — E1142 Type 2 diabetes mellitus with diabetic polyneuropathy: Secondary | ICD-10-CM | POA: Diagnosis not present

## 2020-09-06 DIAGNOSIS — B351 Tinea unguium: Secondary | ICD-10-CM | POA: Diagnosis not present

## 2020-09-06 DIAGNOSIS — L853 Xerosis cutis: Secondary | ICD-10-CM | POA: Diagnosis not present

## 2020-09-06 NOTE — Patient Instructions (Signed)
Diabetes Mellitus and Foot Care Foot care is an important part of your health, especially when you have diabetes. Diabetes may cause you to have problems because of poor blood flow (circulation) to your feet and legs, which can cause your skin to:  Become thinner and drier.  Break more easily.  Heal more slowly.  Peel and crack. You may also have nerve damage (neuropathy) in your legs and feet, causing decreased feeling in them. This means that you may not notice minor injuries to your feet that could lead to more serious problems. Noticing and addressing any potential problems early is the best way to prevent future foot problems. How to care for your feet Foot hygiene  Wash your feet daily with warm water and mild soap. Do not use hot water. Then, pat your feet and the areas between your toes until they are completely dry. Do not soak your feet as this can dry your skin.  Trim your toenails straight across. Do not dig under them or around the cuticle. File the edges of your nails with an emery board or nail file.  Apply a moisturizing lotion or petroleum jelly to the skin on your feet and to dry, brittle toenails. Use lotion that does not contain alcohol and is unscented. Do not apply lotion between your toes.   Shoes and socks  Wear clean socks or stockings every day. Make sure they are not too tight. Do not wear knee-high stockings since they may decrease blood flow to your legs.  Wear shoes that fit properly and have enough cushioning. Always look in your shoes before you put them on to be sure there are no objects inside.  To break in new shoes, wear them for just a few hours a day. This prevents injuries on your feet. Wounds, scrapes, corns, and calluses  Check your feet daily for blisters, cuts, bruises, sores, and redness. If you cannot see the bottom of your feet, use a mirror or ask someone for help.  Do not cut corns or calluses or try to remove them with medicine.  If you  find a minor scrape, cut, or break in the skin on your feet, keep it and the skin around it clean and dry. You may clean these areas with mild soap and water. Do not clean the area with peroxide, alcohol, or iodine.  If you have a wound, scrape, corn, or callus on your foot, look at it several times a day to make sure it is healing and not infected. Check for: ? Redness, swelling, or pain. ? Fluid or blood. ? Warmth. ? Pus or a bad smell.   General tips  Do not cross your legs. This may decrease blood flow to your feet.  Do not use heating pads or hot water bottles on your feet. They may burn your skin. If you have lost feeling in your feet or legs, you may not know this is happening until it is too late.  Protect your feet from hot and cold by wearing shoes, such as at the beach or on hot pavement.  Schedule a complete foot exam at least once a year (annually) or more often if you have foot problems. Report any cuts, sores, or bruises to your health care provider immediately. Where to find more information  American Diabetes Association: www.diabetes.org  Association of Diabetes Care & Education Specialists: www.diabeteseducator.org Contact a health care provider if:  You have a medical condition that increases your risk of infection and   you have any cuts, sores, or bruises on your feet.  You have an injury that is not healing.  You have redness on your legs or feet.  You feel burning or tingling in your legs or feet.  You have pain or cramps in your legs and feet.  Your legs or feet are numb.  Your feet always feel cold.  You have pain around any toenails. Get help right away if:  You have a wound, scrape, corn, or callus on your foot and: ? You have pain, swelling, or redness that gets worse. ? You have fluid or blood coming from the wound, scrape, corn, or callus. ? Your wound, scrape, corn, or callus feels warm to the touch. ? You have pus or a bad smell coming from  the wound, scrape, corn, or callus. ? You have a fever. ? You have a red line going up your leg. Summary  Check your feet every day for blisters, cuts, bruises, sores, and redness.  Apply a moisturizing lotion or petroleum jelly to the skin on your feet and to dry, brittle toenails.  Wear shoes that fit properly and have enough cushioning.  If you have foot problems, report any cuts, sores, or bruises to your health care provider immediately.  Schedule a complete foot exam at least once a year (annually) or more often if you have foot problems. This information is not intended to replace advice given to you by your health care provider. Make sure you discuss any questions you have with your health care provider. Document Revised: 01/11/2020 Document Reviewed: 01/11/2020 Elsevier Patient Education  2021 [employer]  

## 2020-09-06 NOTE — Progress Notes (Signed)
Subjective: Brent Ortiz is a 60 y.o. male patient with history of diabetes who presents to office today complaining of long,mildly painful nails  while ambulating in shoes; unable to trim. Patient states that the glucose reading this morning was not recorded, Last PCP visit was Jan. Patient denies any new changes in medication or new problems.   Review of Systems  All other systems reviewed and are negative.   Patient Active Problem List   Diagnosis Date Noted  . Depression, major, single episode, moderate ([place]) 07/11/2020  . Coronary artery disease involving native heart 02/08/2020  . HFrEF (heart failure with reduced ejection fraction) ([place]) 02/08/2020  . Acute combined systolic and diastolic CHF, NYHA class 2 ([place]) 01/22/2020  . ED (erectile dysfunction) 12/06/2019  . Diabetic polyneuropathy ([place]) 12/06/2019  . Chronic pain 10/19/2019  . Obstructive chronic bronchitis with exacerbation ([place]) 10/03/2019  . Mixed hyperlipidemia 06/18/2017  . Alcohol abuse   . Opiate abuse, continuous ([place])   . Polysubstance abuse ([place])   . Atherosclerosis of coronary artery of native heart with stable angina pectoris, unspecified vessel or lesion type ([place]) 01/23/2016  . Essential hypertension 01/23/2016  . Tobacco dependence 01/23/2016  . Tobacco use disorder 01/23/2016  . Morbid obesity with BMI of 40.0-44.9, adult ([place]) 01/13/2013  . Pain in knee joint 02/12/2012  . Essential thrombocythemia ([place]) 09/29/2011  . Pneumonia due to infectious organism 09/04/2011   Current Outpatient Medications on File Prior to Visit  Medication Sig Dispense Refill  . albuterol (VENTOLIN HFA) 108 (90 Base) MCG/ACT inhaler INHALE 2 PUFFS INTO THE LUNGS EVERY 4 HOURS AS NEEDED FOR WHEEZING OR SHORTNESS OF BREATH. 8.5 g 6  . B-D ULTRAFINE III SHORT PEN 31G X 8 MM MISC USE AS DIRECTED (ONCE DAILY WITH LANTUS INJECTION AS INSTRUCTED) 100 each 3  . colchicine-probenecid 0.5-500 MG tablet Take 1 tablet by mouth 2  (two) times daily as needed (GOUT). 180 tablet 2  . ENTRESTO 24-26 MG TAKE 1 TABLET BY MOUTH 2 TIMES DAILY.(OBLONG PINK TAB WITH LZ NVR) 62 tablet 6  . ezetimibe (ZETIA) 10 MG tablet Take 10 mg by mouth daily.    Marland Kitchen gabapentin (NEURONTIN) 300 MG capsule TAKE 1 CAPSULE (300MG) BY MOUTH TWICE DAILY.(YELLOW CAP) 180 capsule 2  . GOODSENSE ASPIRIN LOW DOSE 81 MG EC tablet TAKE 1 TABLET (81MG) BY MOUTH DAILY. SWALLOW WHOLE.(YELLOW ROUND TAB WITH HEART) 90 tablet 2  . indomethacin (INDOCIN) 25 MG capsule Take 25 mg by mouth 3 (three) times daily.    Marland Kitchen LANTUS SOLOSTAR 100 UNIT/ML Solostar Pen Inject 30 Units into the skin at bedtime. 15 mL 5  . meloxicam (MOBIC) 15 MG tablet TAKE 1 TABLET (15MG TOTAL) BY MOUTH DAILY.(OBLONG YELLOW TAB WITH UL 15) 90 tablet 2  . metFORMIN (GLUCOPHAGE) 500 MG tablet Take 1 tablet (500 mg total) by mouth daily with supper. (Patient taking differently: Take 500 mg by mouth 2 (two) times daily with a meal.) 180 tablet 2  . metoprolol succinate (TOPROL-XL) 25 MG 24 hr tablet TAKE 1 TABLET (25 MG TOTAL) BY MOUTH DAILY.(OVAL WHITE TAB WITH 564) 90 tablet 2  . nicotine (NICODERM CQ - DOSED IN MG/24 HOURS) 14 mg/24hr patch 14 mg daily.    . nicotine (NICODERM CQ - DOSED IN MG/24 HOURS) 21 mg/24hr patch APPLY 1 PATCH (21MG) TOPICALLY TO THE SKIN DAILY. 28 patch 3  . nitroGLYCERIN (NITROSTAT) 0.4 MG SL tablet Place 1 tablet (0.4 mg total) under the tongue every 5 (five) minutes  as needed. For chest pain 50 tablet 3  . pantoprazole (PROTONIX) 40 MG tablet TAKE 1 TABLET(40MG) BY MOUTH DAILY.(OVALYELLOW TAB WITH H126) 90 tablet 2  . potassium chloride SA (KLOR-CON) 20 MEQ tablet TAKE 1 TABLET(20MEQ TOTAL) BY MOUTH 4 TIMES DAILY.(OBLONG WHITE TAB WITH KC M20) 124 tablet 6  . ranolazine (RANEXA) 500 MG 12 hr tablet Take 500 mg by mouth 2 (two) times daily.    . rosuvastatin (CRESTOR) 20 MG tablet TAKE 1 TABLET (20MG TOTAL) BY MOUTH DAILY.(ROUND PINK TAB WITH R20) 90 tablet 2  . sertraline  (ZOLOFT) 25 MG tablet TAKE 1 TABLET(25 MG) BY MOUTH DAILY 90 tablet 2  . spironolactone (ALDACTONE) 25 MG tablet Take 12.5 mg by mouth daily.    Marland Kitchen spironolactone-hydrochlorothiazide (ALDACTAZIDE) 25-25 MG tablet Take 0.5 tablets by mouth daily.    . sucralfate (CARAFATE) 1 g tablet Take 1 g by mouth 4 (four) times daily -  with meals and at bedtime.    . SYMBICORT 160-4.5 MCG/ACT inhaler Inhale 2 puffs into the lungs 2 (two) times daily. 30.8 g 2  . tamsulosin (FLOMAX) 0.4 MG CAPS capsule TAKE 1 CAPSULE (.4MG TOTAL) BY MOUTH DAILY.(GREEN AND TAN CAP WITH TAM 0.4MG) 90 capsule 2  . traZODone (DESYREL) 150 MG tablet Take 1 tablet (150 mg total) by mouth at bedtime. 90 tablet 2   No current facility-administered medications on file prior to visit.   Allergies  Allergen Reactions  . Motrin [Ibuprofen] Other (See Comments)    Reaction: ulcers  . Other Nausea And Vomiting    Patient has ulcers    Recent Results (from the past 2160 hour(s))  CBC with Differential/Platelet     Status: Abnormal   Collection Time: 06/26/20  3:00 PM  Result Value Ref Range   WBC 13.7 (H) 3.4 - 10.8 x10E3/uL   RBC 4.73 4.14 - 5.80 x10E6/uL   Hemoglobin 14.7 13.0 - 17.7 g/dL   Hematocrit 43.3 37.5 - 51.0 %   MCV 92 79 - 97 fL   MCH 31.1 26.6 - 33.0 pg   MCHC 33.9 31.5 - 35.7 g/dL   RDW 12.9 11.6 - 15.4 %   Platelets 566 (H) 150 - 450 x10E3/uL   Neutrophils 85 Not Estab. %   Lymphs 12 Not Estab. %   Monocytes 2 Not Estab. %   Eos 0 Not Estab. %   Basos 0 Not Estab. %   Neutrophils Absolute 11.7 (H) 1.4 - 7.0 x10E3/uL   Lymphocytes Absolute 1.7 0.7 - 3.1 x10E3/uL   Monocytes Absolute 0.3 0.1 - 0.9 x10E3/uL   EOS (ABSOLUTE) 0.0 0.0 - 0.4 x10E3/uL   Basophils Absolute 0.0 0.0 - 0.2 x10E3/uL   Immature Granulocytes 1 Not Estab. %   Immature Grans (Abs) 0.1 0.0 - 0.1 x10E3/uL  Comprehensive metabolic panel     Status: Abnormal   Collection Time: 06/26/20  3:00 PM  Result Value Ref Range   Glucose 122 (H)  65 - 99 mg/dL   BUN 13 8 - 27 mg/dL   Creatinine, Ser 1.16 0.76 - 1.27 mg/dL   GFR calc non Af Amer 68 >59 mL/min/1.73   GFR calc Af Amer 78 >59 mL/min/1.73    Comment: **In accordance with recommendations from the NKF-ASN Task force,**   [facility] is in the process of updating its eGFR calculation to the   2021 CKD-EPI creatinine equation that estimates kidney function   without a race variable.    BUN/Creatinine Ratio 11 10 - 24  Sodium 140 134 - 144 mmol/L   Potassium 4.8 3.5 - 5.2 mmol/L   Chloride 104 96 - 106 mmol/L   CO2 22 20 - 29 mmol/L   Calcium 10.0 8.6 - 10.2 mg/dL   Total Protein 7.4 6.0 - 8.5 g/dL   Albumin 4.5 3.8 - 4.8 g/dL   Globulin, Total 2.9 1.5 - 4.5 g/dL   Albumin/Globulin Ratio 1.6 1.2 - 2.2   Bilirubin Total 0.3 0.0 - 1.2 mg/dL   Alkaline Phosphatase 54 44 - 121 IU/L    Comment:               **Please note reference interval change**   AST 11 0 - 40 IU/L   ALT 8 0 - 44 IU/L    Objective: General: Patient is awake, alert, and oriented x 3 and in no acute distress.  Integument: Skin is warm, dry and supple bilateral. Nails are tender, long, thickened and dystrophic with subungual debris, consistent with onychomycosis, 1-5 bilateral. No signs of infection. No open lesions or preulcerative lesions present bilateral. Remaining integument unremarkable.  Vasculature:  Dorsalis Pedis pulse 1/4 bilateral. Posterior Tibial pulse  1/4 bilateral.  Capillary fill time <3 sec 1-5 bilateral. Positive hair growth to the level of the digits. Temperature gradient within normal limits. No varicosities present bilateral. No edema present bilateral.   Neurology: The patient has intact sensation measured with a 5.07/10g Semmes Weinstein Monofilament at all pedal sites bilateral . Vibratory sensation diminished bilateral with tuning fork. No Babinski sign present bilateral.   Musculoskeletal: Asymptomatic pes planus pedal deformities noted bilateral. Muscular strength 5/5 in all  lower extremity muscular groups bilateral without pain on range of motion . No tenderness with calf compression bilateral.  Assessment and Plan: Problem List Items Addressed This Visit      Endocrine   Diabetic polyneuropathy ([place])    Other Visit Diagnoses    Pain due to onychomycosis of nail    -  Primary   Dry skin          -Examined patient. -Discussed and educated patient on diabetic foot care, especially with  regards to the vascular, neurological and musculoskeletal systems.  -Stressed the importance of good glycemic control and the detriment of not  controlling glucose levels in relation to the foot. -Mechanically debrided all nails 1-5 bilateral using sterile nail nipper and filed with dremel without incident  -Gave sample of foot miracle for dry skin -Answered all patient questions -Patient to return  in 3 months for at risk foot care -Patient advised to call the office if any problems or questions arise in the meantime.  [name], DPM

## 2020-09-09 ENCOUNTER — Ambulatory Visit: Payer: Medicare Other | Admitting: Legal Medicine

## 2020-09-10 ENCOUNTER — Other Ambulatory Visit: Payer: Self-pay | Admitting: Legal Medicine

## 2020-09-27 ENCOUNTER — Ambulatory Visit (INDEPENDENT_AMBULATORY_CARE_PROVIDER_SITE_OTHER): Payer: Medicare Other | Admitting: Legal Medicine

## 2020-09-27 ENCOUNTER — Encounter: Payer: Self-pay | Admitting: Legal Medicine

## 2020-09-27 ENCOUNTER — Other Ambulatory Visit: Payer: Self-pay

## 2020-09-27 VITALS — BP 142/80 | HR 63 | Temp 97.2°F | Resp 17 | Ht 68.0 in | Wt 296.0 lb

## 2020-09-27 DIAGNOSIS — Z6841 Body Mass Index (BMI) 40.0 and over, adult: Secondary | ICD-10-CM

## 2020-09-27 DIAGNOSIS — E1142 Type 2 diabetes mellitus with diabetic polyneuropathy: Secondary | ICD-10-CM | POA: Diagnosis not present

## 2020-09-27 DIAGNOSIS — I5020 Unspecified systolic (congestive) heart failure: Secondary | ICD-10-CM | POA: Diagnosis not present

## 2020-09-27 DIAGNOSIS — F17200 Nicotine dependence, unspecified, uncomplicated: Secondary | ICD-10-CM

## 2020-09-27 DIAGNOSIS — N3000 Acute cystitis without hematuria: Secondary | ICD-10-CM

## 2020-09-27 DIAGNOSIS — E782 Mixed hyperlipidemia: Secondary | ICD-10-CM

## 2020-09-27 DIAGNOSIS — I25118 Atherosclerotic heart disease of native coronary artery with other forms of angina pectoris: Secondary | ICD-10-CM

## 2020-09-27 DIAGNOSIS — I10 Essential (primary) hypertension: Secondary | ICD-10-CM

## 2020-09-27 LAB — POCT URINALYSIS DIP (CLINITEK)
Blood, UA: NEGATIVE
Glucose, UA: NEGATIVE mg/dL
Ketones, POC UA: NEGATIVE mg/dL
Nitrite, UA: NEGATIVE
Spec Grav, UA: >=1.03 — AB (ref 1.010–1.025)
Urobilinogen, UA: 0.2 E.U./dL
pH, UA: 6 (ref 5.0–8.0)

## 2020-09-27 LAB — POCT UA - MICROALBUMIN: Microalbumin Ur, POC: 80 mg/L

## 2020-09-27 NOTE — Progress Notes (Signed)
Good, culture urine lp

## 2020-09-27 NOTE — Progress Notes (Signed)
Subjective:  Patient ID: Brent Ortiz, male    DOB: Nov 09, 1960  Age: 60 y.o. MRN: [id number]  Chief Complaint  Patient presents with  . Diabetes  . Hyperlipidemia  . COPD    HPI: Chronc visit  Patient present with type 2 diabetes.  Specifically, this is type 2, insulin requiring diabetes, complicated by polyneuropathy.  Compliance with treatment has been good; patient take medicines as directed, maintains diet and exercise regimen, follows up as directed, and is keeping glucose diary.  Date of  diagnosis 2010.  Depression screen has been performed.Tobacco screen smoker. Current medicines for diabetes lantus 30 iu qhs, metformin.  Patient is on none for renal protection and crestor for cholesterol control.  Patient performs foot exams daily and last ophthalmologic exam was yes.  Patient presents with hyperlipidemia.  Compliance with treatment has been good; patient takes medicines as directed, maintains low cholesterol diet, follows up as directed, and maintains exercise regimen.  Patient is using crestor without problems.  Patient presents with diagnosis of COPD.  It not secondary to prolonged asthma.  Diagnosis 20  Treatment includes symbicort, albuterol.  The diagnosis has not been hospitalized for this diagnosis. Last na.  Patient is compliant with regular use of medicines.   CORONARY ARTERY DISEASE  Patient presents in follow up of CAD. Patient was diagnosed in 2020. The patient has no associated CHF. The patient is currently taking a beta blocker, statin, and aspirin. CAD was diagnosed 2 years ago.  Patient is having no angina. Patient has used no NTG.  Patient is followed by cardiology.  Patient had no . Last angiography was 2020, last echocardiogram na.    Current Outpatient Medications on File Prior to Visit  Medication Sig Dispense Refill  . albuterol (VENTOLIN HFA) 108 (90 Base) MCG/ACT inhaler INHALE 2 PUFFS INTO THE LUNGS EVERY 4 HOURS AS NEEDED FOR WHEEZING OR SHORTNESS OF  BREATH. 8.5 g 6  . B-D ULTRAFINE III SHORT PEN 31G X 8 MM MISC USE AS DIRECTED (ONCE DAILY WITH LANTUS INJECTION AS INSTRUCTED) 100 each 3  . colchicine-probenecid 0.5-500 MG tablet Take 1 tablet by mouth 2 (two) times daily as needed (GOUT). 180 tablet 2  . ENTRESTO 24-26 MG TAKE 1 TABLET BY MOUTH 2 TIMES DAILY.(OBLONG PINK TAB WITH LZ NVR) 62 tablet 6  . ezetimibe (ZETIA) 10 MG tablet Take 10 mg by mouth daily.    Marland Kitchen gabapentin (NEURONTIN) 300 MG capsule TAKE 1 CAPSULE (300MG ) BY MOUTH TWICE DAILY.(YELLOW CAP) 180 capsule 2  . GOODSENSE ASPIRIN LOW DOSE 81 MG EC tablet TAKE 1 TABLET (81MG ) BY MOUTH DAILY. SWALLOW WHOLE.(YELLOW ROUND TAB WITH HEART) 90 tablet 2  . indomethacin (INDOCIN) 25 MG capsule Take 1 capsule (25 mg total) by mouth 3 (three) times daily as needed (for acute gout only). 93 capsule 1  . LANTUS SOLOSTAR 100 UNIT/ML Solostar Pen Inject 30 Units into the skin at bedtime. 15 mL 5  . meloxicam (MOBIC) 15 MG tablet TAKE 1 TABLET (15MG  TOTAL) BY MOUTH DAILY.(OBLONG YELLOW TAB WITH UL 15) 90 tablet 2  . metFORMIN (GLUCOPHAGE) 500 MG tablet Take 1 tablet (500 mg total) by mouth daily with supper. (Patient taking differently: Take 500 mg by mouth 2 (two) times daily with a meal.) 180 tablet 2  . metoprolol succinate (TOPROL-XL) 25 MG 24 hr tablet TAKE 1 TABLET (25 MG TOTAL) BY MOUTH DAILY.(OVAL WHITE TAB WITH 564) 90 tablet 2  . nitroGLYCERIN (NITROSTAT) 0.4 MG SL tablet Place 1  tablet (0.4 mg total) under the tongue every 5 (five) minutes as needed. For chest pain 50 tablet 3  . pantoprazole (PROTONIX) 40 MG tablet TAKE 1 TABLET(40MG ) BY MOUTH DAILY.(OVALYELLOW TAB WITH H126) 90 tablet 2  . potassium chloride SA (KLOR-CON) 20 MEQ tablet TAKE 1 TABLET(20MEQ TOTAL) BY MOUTH 4 TIMES DAILY.(OBLONG WHITE TAB WITH KC M20) 124 tablet 6  . ranolazine (RANEXA) 500 MG 12 hr tablet Take 500 mg by mouth 2 (two) times daily.    . rosuvastatin (CRESTOR) 20 MG tablet TAKE 1 TABLET (20MG  TOTAL) BY MOUTH  DAILY.(ROUND PINK TAB WITH R20) 90 tablet 2  . sertraline (ZOLOFT) 25 MG tablet TAKE 1 TABLET(25 MG) BY MOUTH DAILY 90 tablet 2  . spironolactone (ALDACTONE) 25 MG tablet Take 12.5 mg by mouth daily.    Marland Kitchen spironolactone-hydrochlorothiazide (ALDACTAZIDE) 25-25 MG tablet Take 0.5 tablets by mouth daily.    . sucralfate (CARAFATE) 1 g tablet Take 1 g by mouth 4 (four) times daily -  with meals and at bedtime.    . SYMBICORT 160-4.5 MCG/ACT inhaler Inhale 2 puffs into the lungs 2 (two) times daily. 30.8 g 2  . tamsulosin (FLOMAX) 0.4 MG CAPS capsule TAKE 1 CAPSULE (.4MG  TOTAL) BY MOUTH DAILY.(GREEN AND TAN CAP WITH TAM 0.4MG ) 90 capsule 2  . traZODone (DESYREL) 150 MG tablet Take 1 tablet (150 mg total) by mouth at bedtime. 90 tablet 2   No current facility-administered medications on file prior to visit.   Past Medical History:  Diagnosis Date  . Acute combined systolic and diastolic CHF, NYHA class 2 ([place]) 01/22/2020  . Alcohol abuse   . Chronic pain 10/19/2019   Sees pain clinic  . Coronary artery disease   . Coronary atherosclerosis of native coronary artery 01/23/2016   Overview:  Completely occluded circumflex artery proximal to obtuse marginal 1 basin cardiac catheter from 2017  . Diabetic polyneuropathy ([place]) 12/06/2019  . ED (erectile dysfunction) 12/06/2019  . Essential hypertension 01/23/2016  . HTN (hypertension)   . Leukocytosis 09/04/2011  . Mixed hyperlipidemia 06/18/2017  . Morbid obesity with BMI of 40.0-44.9, adult ([place]) 01/13/2013  . Myocardial infarction ([place])    04-13-2011  . Obstructive chronic bronchitis with exacerbation ([place]) 10/03/2019  . Opiate abuse, continuous ([place])   . Osteoarthritis   . Pain in knee joint 02/12/2012   Formatting of this note might be different from the original. Left  . Pneumonia   . Pneumonia due to infectious organism 09/04/2011  . Polysubstance abuse ([place])   . Poor dentition   . Thrombocytosis 09/29/2011  . Tobacco dependence 01/23/2016  . Tobacco  use disorder 01/23/2016   Past Surgical History:  Procedure Laterality Date  . HERNIA REPAIR  2009  . I & D KNEE WITH POLY EXCHANGE  09/03/2011   Procedure: IRRIGATION AND DEBRIDEMENT KNEE WITH POLY EXCHANGE;  Surgeon: Mauri Pole, MD;  Location: [facility];  Service: Orthopedics;  Laterality: Left;  . TOOTH EXTRACTION Bilateral 08/06/2017   Procedure: DENTAL RESTORATION/EXTRACTIONS;  Surgeon: Diona Browner, DDS;  Location: [facility];  Service: Oral Surgery;  Laterality: Bilateral;  . TOTAL KNEE ARTHROPLASTY   right  feb 2012   left march 2012 r  . TOTAL KNEE REVISION  06/23/2011   Procedure: TOTAL KNEE REVISION;  Surgeon: Mauri Pole;  Location: [facility];  Service: Orthopedics;  Laterality: Left;  femomal nerve block done in holding area without incident    Family History  Problem Relation Age of Onset  .  Colon cancer Other   . Colon cancer Mother    Social History   Socioeconomic History  . Marital status: Widowed    Spouse name: Not on file  . Number of children: 5  . Years of education: Not on file  . Highest education level: Not on file  Occupational History  . Occupation: CNA  Tobacco Use  . Smoking status: Current Every Day Smoker    Packs/day: 1.00    Years: 37.00    Pack years: 37.00    Types: Cigarettes  . Smokeless tobacco: Never Used  Vaping Use  . Vaping Use: Former  Substance and Sexual Activity  . Alcohol use: No    Comment: heavy drinker in past   . Drug use: No  . Sexual activity: Not Currently  Other Topics Concern  . Not on file  Social History Narrative  . Not on file   Social Determinants of Health   Financial Resource Strain: Low Risk   . Difficulty of Paying Living Expenses: Not very hard  Food Insecurity: No Food Insecurity  . Worried About Charity fundraiser in the Last Year: Never true  . Ran Out of Food in the Last Year: Never true  Transportation Needs: No Transportation Needs  . Lack of Transportation (Medical): No  . Lack of  Transportation (Non-Medical): No  Physical Activity: Inactive  . Days of Exercise per Week: 0 days  . Minutes of Exercise per Session: 0 min  Stress: No Stress Concern Present  . Feeling of Stress : Not at all  Social Connections: Moderately Integrated  . Frequency of Communication with Friends and Family: Twice a week  . Frequency of Social Gatherings with Friends and Family: Once a week  . Attends Religious Services: More than 4 times per year  . Active Member of Clubs or Organizations: Yes  . Attends Archivist Meetings: More than 4 times per year  . Marital Status: Widowed    Review of Systems  Constitutional: Negative for activity change and appetite change.  HENT: Positive for congestion.   Eyes: Negative for visual disturbance.  Respiratory: Negative for chest tightness and shortness of breath.   Cardiovascular: Negative for chest pain, palpitations and leg swelling.  Gastrointestinal: Negative for abdominal distention and abdominal pain.  Endocrine: Positive for polyuria.  Genitourinary: Negative for dysuria and urgency.  Musculoskeletal: Positive for arthralgias. Negative for back pain.  Skin: Negative.   Neurological: Negative.   Psychiatric/Behavioral: Negative.      Objective:  BP (!) 142/80   Pulse 63   Temp (!) 97.2 F (36.2 C)   Resp 17   Ht 5\' 8"  (1.727 m)   Wt 296 lb (134.3 kg)   SpO2 96%   BMI 45.01 kg/m   BP/Weight 09/27/2020 07/11/2020 56/81/2751  Systolic BP 700 174 944  Diastolic BP 80 90 92  Wt. (Lbs) 296 283.4 284  BMI 45.01 43.09 43.18    Physical Exam Vitals reviewed.  Constitutional:      Appearance: Normal appearance.  HENT:     Head: Normocephalic.     Right Ear: Tympanic membrane, ear canal and external ear normal.     Left Ear: Tympanic membrane, ear canal and external ear normal.     Nose: Nose normal.     Mouth/Throat:     Mouth: Mucous membranes are moist.     Pharynx: Oropharynx is clear.  Eyes:      Conjunctiva/sclera: Conjunctivae normal.     Pupils:  Pupils are equal, round, and reactive to light.  Cardiovascular:     Rate and Rhythm: Normal rate and regular rhythm.     Pulses: Normal pulses.     Heart sounds: Normal heart sounds. No murmur heard. No gallop.   Pulmonary:     Effort: Pulmonary effort is normal. No respiratory distress.     Breath sounds: Normal breath sounds. No rales.  Abdominal:     General: Abdomen is flat. Bowel sounds are normal. There is no distension.     Palpations: Abdomen is soft.     Tenderness: There is no abdominal tenderness.  Musculoskeletal:        General: Tenderness present. Normal range of motion.     Cervical back: Normal range of motion.  Skin:    General: Skin is warm.     Capillary Refill: Capillary refill takes less than 2 seconds.  Neurological:     General: No focal deficit present.     Mental Status: He is alert and oriented to person, place, and time.     Sensory: Sensory deficit present.     Diabetic Foot Exam - Simple   Simple Foot Form Diabetic Foot exam was performed with the following findings: Yes 09/27/2020  9:03 AM  Visual Inspection No deformities, no ulcerations, no other skin breakdown bilaterally: Yes Sensation Testing See comments: Yes Pulse Check Posterior Tibialis and Dorsalis pulse intact bilaterally: Yes Comments Decreased sensation feet      Lab Results  Component Value Date   WBC 9.0 09/27/2020   HGB 15.2 09/27/2020   HCT 45.5 09/27/2020   PLT 373 09/27/2020   GLUCOSE 118 (H) 09/27/2020   CHOL 236 (H) 09/27/2020   TRIG 89 09/27/2020   HDL 55 09/27/2020   LDLCALC 165 (H) 09/27/2020   ALT 13 09/27/2020   AST 10 09/27/2020   NA 137 09/27/2020   K 5.4 (H) 09/27/2020   CL 101 09/27/2020   CREATININE 1.27 09/27/2020   BUN 13 09/27/2020   CO2 20 09/27/2020   INR 1.81 (H) 09/07/2011   HGBA1C 6.2 (H) 09/27/2020   MICROALBUR 80 09/27/2020      Assessment & Plan:   Diagnoses and all orders  for this visit: Morbid obesity with BMI of 40.0-44.9, adult ([place]) An individualize plan was formulated for obesity using patient history and physical exam to encourage weight loss.  An evidence based program was formulated.  Patient is to cut portion size with meals and to plan physical exercise 3 days a week at least 20 minutes.  Weight watchers and other programs are helpful.  Planned amount of weight loss 10 lbs.  Tobacco use disorder Individual plan was given to patient based on exam, history and other tests and using evidence based criteria for care for smoking cessation.  We discussed behavioral changes to help cessation and offered medicines to aid in quitting.  Medical consequences of tobacco use were explained.  Diabetic polyneuropathy associated with type 2 diabetes mellitus (HCC) -     Hemoglobin A1c -     POCT UA - Microalbumin -     POCT URINALYSIS DIP ([facility]) An individual care plan for diabetes was established and reinforced today.  The patient's status was assessed using clinical findings on exam, labs and diagnostic testing. Patient success at meeting goals based on disease specific evidence-based guidelines and found to be fair controlled. Medications were assessed and patient's understanding of the medical issues , including barriers were assessed. Recommend adherence to a  diabetic diet, a graduated exercise program, HgbA1c level is checked quarterly, and urine microalbumin performed yearly .  Annual mono-filament sensation testing performed. Lower blood pressure and control hyperlipidemia is important. Get annual eye exams and annual flu shots and smoking cessation discussed.  Self management goals were discussed.  HFrEF (heart failure with reduced ejection fraction) ([place]) An individualized care plan was established and reinforced.  The patient's disease status was assessed using clinical finding son exam today, labs, and/or other diagnostic testing such as x-rays, to determine  the patient's success in meeting treatmentgoalsbased on disease-based guidelines and found to beimproving. But not at goal yet. Medications prescriptions no changes Laboratory tests ordered to be performed today include routine. RECOMMENDATIONS: given include see cardiology.  Call physician is patient gains 3 lbs in one day or 5 lbs for one week.  Call for progressive PND, orthopnea or increased pedal edema.  Essential hypertension -     Comprehensive metabolic panel An individual hypertension care plan was established and reinforced today.  The patient's status was assessed using clinical findings on exam and labs or diagnostic tests. The patient's success at meeting treatment goals on disease specific evidence-based guidelines and found to be well controlled. SELF MANAGEMENT: The patient and I together assessed ways to personally work towards obtaining the recommended goals. RECOMMENDATIONS: avoid decongestants found in common cold remedies, decrease consumption of alcohol, perform routine monitoring of BP with home BP cuff, exercise, reduction of dietary salt, take medicines as prescribed, try not to miss doses and quit smoking.  Regular exercise and maintaining a healthy weight is needed.  Stress reduction may help. A CLINICAL SUMMARY including written plan identify barriers to care unique to individual due to social or financial issues.  We attempt to mutually creat solutions for individual and family understanding.  Atherosclerosis of coronary artery of native heart with stable angina pectoris, unspecified vessel or lesion type [facility]) Patient's CAD was assessed using history and physical along with other information to maximize treatment.  Evidence based criteria was use in deciding proper management for this disease process.  Patient's CAD is under good control.therapy continue present treatment . Mixed hyperlipidemia -     CBC with Differential/Platelet -     Lipid panel AN INDIVIDUAL CARE  PLAN for hyperlipidemia/ cholesterol was established and reinforced today.  The patient's status was assessed using clinical findings on exam, lab and other diagnostic tests. The patient's disease status was assessed based on evidence-based guidelines and found to be fair controlled. MEDICATIONS were reviewe. SELF MANAGEMENT GOALS have been discussed and patient's success at attaining the goal of low cholesterol was assessed. RECOMMENDATION given include regular exercise 3 days a week and low cholesterol/low fat diet. CLINICAL SUMMARY including written plan to identify barriers unique to the patient due to social or economic  reasons was discussed.  Acute cystitis without hematuria -     Urine Culture Culture urine, treat if positive Other orders -     Cardiovascular Risk Assessment         I spent 30 minutes dedicated to the care of this patient on the date of this encounter to include face-to-face time with the patient, as well as: reviewed old records  Follow-up: Return in about 4 months (around 01/27/2021) for fasting.  An After Visit Summary was printed and given to the patient.  Reinaldo Meeker, MD [name] [phone]

## 2020-09-28 LAB — CBC WITH DIFFERENTIAL/PLATELET
Basophils Absolute: 0.1 10*3/uL (ref 0.0–0.2)
Basos: 1 %
EOS (ABSOLUTE): 0.1 10*3/uL (ref 0.0–0.4)
Eos: 1 %
Hematocrit: 45.5 % (ref 37.5–51.0)
Hemoglobin: 15.2 g/dL (ref 13.0–17.7)
Immature Grans (Abs): 0 10*3/uL (ref 0.0–0.1)
Immature Granulocytes: 0 %
Lymphocytes Absolute: 2.4 10*3/uL (ref 0.7–3.1)
Lymphs: 27 %
MCH: 29.9 pg (ref 26.6–33.0)
MCHC: 33.4 g/dL (ref 31.5–35.7)
MCV: 89 fL (ref 79–97)
Monocytes Absolute: 0.5 10*3/uL (ref 0.1–0.9)
Monocytes: 6 %
Neutrophils Absolute: 5.8 10*3/uL (ref 1.4–7.0)
Neutrophils: 65 %
Platelets: 373 10*3/uL (ref 150–450)
RBC: 5.09 x10E6/uL (ref 4.14–5.80)
RDW: 13.1 % (ref 11.6–15.4)
WBC: 9 10*3/uL (ref 3.4–10.8)

## 2020-09-28 LAB — COMPREHENSIVE METABOLIC PANEL
ALT: 13 IU/L (ref 0–44)
AST: 10 IU/L (ref 0–40)
Albumin/Globulin Ratio: 1.9 (ref 1.2–2.2)
Albumin: 4.6 g/dL (ref 3.8–4.8)
Alkaline Phosphatase: 62 IU/L (ref 44–121)
BUN/Creatinine Ratio: 10 (ref 10–24)
BUN: 13 mg/dL (ref 8–27)
Bilirubin Total: 0.5 mg/dL (ref 0.0–1.2)
CO2: 20 mmol/L (ref 20–29)
Calcium: 9.5 mg/dL (ref 8.6–10.2)
Chloride: 101 mmol/L (ref 96–106)
Creatinine, Ser: 1.27 mg/dL (ref 0.76–1.27)
Globulin, Total: 2.4 g/dL (ref 1.5–4.5)
Glucose: 118 mg/dL — ABNORMAL HIGH (ref 65–99)
Potassium: 5.4 mmol/L — ABNORMAL HIGH (ref 3.5–5.2)
Sodium: 137 mmol/L (ref 134–144)
Total Protein: 7 g/dL (ref 6.0–8.5)
eGFR: 64 mL/min/{1.73_m2} (ref >59)

## 2020-09-28 LAB — LIPID PANEL
Chol/HDL Ratio: 4.3 ratio (ref 0.0–5.0)
Cholesterol, Total: 236 mg/dL — ABNORMAL HIGH (ref 100–199)
HDL: 55 mg/dL (ref >39)
LDL Chol Calc (NIH): 165 mg/dL — ABNORMAL HIGH (ref 0–99)
Triglycerides: 89 mg/dL (ref 0–149)
VLDL Cholesterol Cal: 16 mg/dL (ref 5–40)

## 2020-09-28 LAB — CARDIOVASCULAR RISK ASSESSMENT

## 2020-09-28 LAB — HEMOGLOBIN A1C
Est. average glucose Bld gHb Est-mCnc: 131 mg/dL
Hgb A1c MFr Bld: 6.2 % — ABNORMAL HIGH (ref 4.8–5.6)

## 2020-09-29 NOTE — Progress Notes (Signed)
CBC normal, glucose 118, kidney tests normal, Potassium 5.4 high, repeat in one week, decrease potassium to twice a day, liver tests normal, A1c 6.2, LDL high 165, is he taking crestor lp

## 2020-09-30 ENCOUNTER — Other Ambulatory Visit: Payer: Self-pay

## 2020-09-30 DIAGNOSIS — E875 Hyperkalemia: Secondary | ICD-10-CM

## 2020-09-30 LAB — URINE CULTURE

## 2020-09-30 NOTE — Progress Notes (Signed)
Urine culture no growth lp

## 2020-10-07 ENCOUNTER — Other Ambulatory Visit: Payer: Self-pay

## 2020-10-07 DIAGNOSIS — E1140 Type 2 diabetes mellitus with diabetic neuropathy, unspecified: Secondary | ICD-10-CM

## 2020-10-07 MED ORDER — METFORMIN HCL 500 MG PO TABS: 500.0000 mg | ORAL_TABLET | Freq: Every day | ORAL | 2 refills | Status: DC

## 2020-10-07 MED ORDER — COLCHICINE-PROBENECID 0.5-500 MG PO TABS: 1.0000 | ORAL_TABLET | Freq: Two times a day (BID) | ORAL | 2 refills | Status: DC | PRN

## 2020-10-07 MED ORDER — SYMBICORT 160-4.5 MCG/ACT IN AERO: 2.0000 | INHALATION_SPRAY | Freq: Two times a day (BID) | RESPIRATORY_TRACT | 2 refills | Status: DC

## 2020-10-08 ENCOUNTER — Other Ambulatory Visit: Payer: Self-pay

## 2020-10-08 ENCOUNTER — Ambulatory Visit: Payer: Medicare Other

## 2020-10-08 DIAGNOSIS — E875 Hyperkalemia: Secondary | ICD-10-CM | POA: Diagnosis not present

## 2020-10-09 LAB — COMPREHENSIVE METABOLIC PANEL
ALT: 12 IU/L (ref 0–44)
AST: 13 IU/L (ref 0–40)
Albumin/Globulin Ratio: 1.7 (ref 1.2–2.2)
Albumin: 4.1 g/dL (ref 3.8–4.8)
Alkaline Phosphatase: 59 IU/L (ref 44–121)
BUN/Creatinine Ratio: 11 (ref 10–24)
BUN: 15 mg/dL (ref 8–27)
Bilirubin Total: 0.3 mg/dL (ref 0.0–1.2)
CO2: 22 mmol/L (ref 20–29)
Calcium: 9.5 mg/dL (ref 8.6–10.2)
Chloride: 102 mmol/L (ref 96–106)
Creatinine, Ser: 1.33 mg/dL — ABNORMAL HIGH (ref 0.76–1.27)
Globulin, Total: 2.4 g/dL (ref 1.5–4.5)
Glucose: 140 mg/dL — ABNORMAL HIGH (ref 65–99)
Potassium: 5.1 mmol/L (ref 3.5–5.2)
Sodium: 138 mmol/L (ref 134–144)
Total Protein: 6.5 g/dL (ref 6.0–8.5)
eGFR: 60 mL/min/{1.73_m2} (ref >59)

## 2020-10-09 NOTE — Progress Notes (Signed)
Glucose 140, creatinine 1.33 up, potassium normal 5.1 lp

## 2020-11-05 ENCOUNTER — Other Ambulatory Visit: Payer: Self-pay | Admitting: Legal Medicine

## 2020-11-06 ENCOUNTER — Other Ambulatory Visit: Payer: Self-pay

## 2020-11-06 MED ORDER — NICOTINE 21 MG/24HR TD PT24: 21.0000 mg | MEDICATED_PATCH | Freq: Every day | TRANSDERMAL | 0 refills | Status: DC

## 2020-11-12 LAB — HM DIABETES EYE EXAM

## 2020-11-20 ENCOUNTER — Encounter: Payer: Self-pay | Admitting: Legal Medicine

## 2020-11-21 ENCOUNTER — Other Ambulatory Visit: Payer: Self-pay

## 2020-11-21 ENCOUNTER — Encounter: Payer: Self-pay | Admitting: Legal Medicine

## 2020-11-21 ENCOUNTER — Ambulatory Visit (INDEPENDENT_AMBULATORY_CARE_PROVIDER_SITE_OTHER): Payer: Medicare Other | Admitting: Legal Medicine

## 2020-11-21 VITALS — BP 128/86 | HR 84 | Temp 97.7°F | Ht 68.0 in | Wt 304.0 lb

## 2020-11-21 DIAGNOSIS — Z6841 Body Mass Index (BMI) 40.0 and over, adult: Secondary | ICD-10-CM

## 2020-11-21 DIAGNOSIS — M25562 Pain in left knee: Secondary | ICD-10-CM | POA: Diagnosis not present

## 2020-11-21 DIAGNOSIS — M25561 Pain in right knee: Secondary | ICD-10-CM

## 2020-11-21 DIAGNOSIS — G894 Chronic pain syndrome: Secondary | ICD-10-CM

## 2020-11-21 HISTORY — DX: Morbid (severe) obesity due to excess calories: E66.01

## 2020-11-21 NOTE — Progress Notes (Signed)
Acute Office Visit  Subjective:    Patient ID: Brent Ortiz, male    DOB: 02-15-1961, 60 y.o.   MRN: [id number]  Chief Complaint  Patient presents with  . Knee Pain    Both    HPI Patient is in today for chronic left knee pain.  He has TKA in 2012.  It is unstable and causing chronic pain.He needs revision.  He is in chronic pain and refer to [facility] clinic.  We will inject today. Dr. Alvan Dame did last revision an dit got infected.  Prolonged recovery, he has sever OA left hip aso  Patient has history of opioid abuse  Past Medical History:  Diagnosis Date  . Acute combined systolic and diastolic CHF, NYHA class 2 ([place]) 01/22/2020  . Alcohol abuse   . Chronic pain 10/19/2019   Sees pain clinic  . Coronary artery disease   . Coronary atherosclerosis of native coronary artery 01/23/2016   Overview:  Completely occluded circumflex artery proximal to obtuse marginal 1 basin cardiac catheter from 2017  . Diabetic polyneuropathy ([place]) 12/06/2019  . ED (erectile dysfunction) 12/06/2019  . Essential hypertension 01/23/2016  . HTN (hypertension)   . Leukocytosis 09/04/2011  . Mixed hyperlipidemia 06/18/2017  . Morbid obesity with BMI of 40.0-44.9, adult ([place]) 01/13/2013  . Myocardial infarction ([place])    04-13-2011  . Obstructive chronic bronchitis with exacerbation ([place]) 10/03/2019  . Opiate abuse, continuous ([place])   . Osteoarthritis   . Pain in knee joint 02/12/2012   Formatting of this note might be different from the original. Left  . Pneumonia   . Pneumonia due to infectious organism 09/04/2011  . Polysubstance abuse ([place])   . Poor dentition   . Thrombocytosis 09/29/2011  . Tobacco dependence 01/23/2016  . Tobacco use disorder 01/23/2016    Past Surgical History:  Procedure Laterality Date  . HERNIA REPAIR  2009  . I & D KNEE WITH POLY EXCHANGE  09/03/2011   Procedure: IRRIGATION AND DEBRIDEMENT KNEE WITH POLY EXCHANGE;  Surgeon: Mauri Pole, MD;  [facility];  Service: Orthopedics;   Laterality: Left;  . TOOTH EXTRACTION Bilateral 08/06/2017   Procedure: DENTAL RESTORATION/EXTRACTIONS;  Surgeon: Diona Browner, DDS;  Location: [place];  Service: Oral Surgery;  Laterality: Bilateral;  . TOTAL KNEE ARTHROPLASTY   right  feb 2012   left march 2012 r  . TOTAL KNEE REVISION  06/23/2011   Procedure: TOTAL KNEE REVISION;  Surgeon: Mauri Pole;  [facility];  Service: Orthopedics;  Laterality: Left;  femomal nerve block done in holding area without incident    Family History  Problem Relation Age of Onset  . Colon cancer Other   . Colon cancer Mother     Social History   Socioeconomic History  . Marital status: Widowed    Spouse name: Not on file  . Number of children: 5  . Years of education: Not on file  . Highest education level: Not on file  Occupational History  . Occupation: CNA  Tobacco Use  . Smoking status: Current Every Day Smoker    Packs/day: 1.00    Years: 37.00    Pack years: 37.00    Types: Cigarettes  . Smokeless tobacco: Never Used  Vaping Use  . Vaping Use: Former  Substance and Sexual Activity  . Alcohol use: No    Comment: heavy drinker in past   . Drug use: No  . Sexual activity: Not Currently  Other Topics Concern  .  Not on file  Social History Narrative  . Not on file   Social Determinants of Health   Financial Resource Strain: Low Risk   . Difficulty of Paying Living Expenses: Not very hard  Food Insecurity: No Food Insecurity  . Worried About Charity fundraiser in the Last Year: Never true  . Ran Out of Food in the Last Year: Never true  Transportation Needs: No Transportation Needs  . Lack of Transportation (Medical): No  . Lack of Transportation (Non-Medical): No  Physical Activity: Inactive  . Days of Exercise per Week: 0 days  . Minutes of Exercise per Session: 0 min  Stress: No Stress Concern Present  . Feeling of Stress : Not at all  Social Connections: Moderately Integrated  . Frequency of Communication with  Friends and Family: Twice a week  . Frequency of Social Gatherings with Friends and Family: Once a week  . Attends Religious Services: More than 4 times per year  . Active Member of Clubs or Organizations: Yes  . Attends Archivist Meetings: More than 4 times per year  . Marital Status: Widowed  Intimate Partner Violence: Not At Risk  . Fear of Current or Ex-Partner: No  . Emotionally Abused: No  . Physically Abused: No  . Sexually Abused: No    Outpatient Medications Prior to Visit  Medication Sig Dispense Refill  . albuterol (VENTOLIN HFA) 108 (90 Base) MCG/ACT inhaler INHALE 2 PUFFS INTO THE LUNGS EVERY 4 HOURS AS NEEDED FOR WHEEZING OR SHORTNESS OF BREATH. 8.5 g 6  . B-D ULTRAFINE III SHORT PEN 31G X 8 MM MISC USE AS DIRECTED (ONCE DAILY WITH LANTUS INJECTION AS INSTRUCTED) 100 each 3  . colchicine-probenecid 0.5-500 MG tablet Take 1 tablet by mouth 2 (two) times daily as needed (GOUT). 180 tablet 2  . ENTRESTO 24-26 MG TAKE 1 TABLET BY MOUTH 2 TIMES DAILY.(OBLONG PINK TAB WITH LZ NVR) 62 tablet 6  . ezetimibe (ZETIA) 10 MG tablet Take 10 mg by mouth daily.    Marland Kitchen gabapentin (NEURONTIN) 300 MG capsule TAKE 1 CAPSULE (300MG) BY MOUTH TWICE DAILY.(YELLOW CAP) 180 capsule 2  . GOODSENSE ASPIRIN LOW DOSE 81 MG EC tablet TAKE 1 TABLET (81MG) BY MOUTH DAILY. SWALLOW WHOLE.(YELLOW ROUND TAB WITH HEART) 90 tablet 2  . indomethacin (INDOCIN) 25 MG capsule TAKE 1 CAPSULE (25MG TOTAL) BY MOUTH 3 TIMES DAILY. (GREEN CAPSULE WITH H 103 OR WITH G406) 93 capsule 1  . LANTUS SOLOSTAR 100 UNIT/ML Solostar Pen Inject 30 Units into the skin at bedtime. 15 mL 5  . meloxicam (MOBIC) 15 MG tablet TAKE 1 TABLET (15MG TOTAL) BY MOUTH DAILY.(OBLONG YELLOW TAB WITH UL 15) 90 tablet 2  . metFORMIN (GLUCOPHAGE) 500 MG tablet Take 1 tablet (500 mg total) by mouth daily with supper. 90 tablet 2  . metoprolol succinate (TOPROL-XL) 25 MG 24 hr tablet TAKE 1 TABLET (25 MG TOTAL) BY MOUTH DAILY.(OVAL WHITE TAB  WITH 564) 90 tablet 2  . nicotine (NICODERM CQ - DOSED IN MG/24 HOURS) 21 mg/24hr patch Place 1 patch (21 mg total) onto the skin daily. 28 patch 0  . nitroGLYCERIN (NITROSTAT) 0.4 MG SL tablet Place 1 tablet (0.4 mg total) under the tongue every 5 (five) minutes as needed. For chest pain 50 tablet 3  . pantoprazole (PROTONIX) 40 MG tablet TAKE 1 TABLET(40MG) BY MOUTH DAILY.(OVALYELLOW TAB WITH H126) 90 tablet 2  . potassium chloride SA (KLOR-CON) 20 MEQ tablet TAKE 1 TABLET(20MEQ TOTAL) BY  MOUTH 4 TIMES DAILY.(OBLONG WHITE TAB WITH KC M20) 124 tablet 6  . ranolazine (RANEXA) 500 MG 12 hr tablet Take 500 mg by mouth 2 (two) times daily.    . rosuvastatin (CRESTOR) 20 MG tablet TAKE 1 TABLET (20MG TOTAL) BY MOUTH DAILY.(ROUND PINK TAB WITH R20) 90 tablet 2  . sertraline (ZOLOFT) 25 MG tablet TAKE 1 TABLET(25 MG) BY MOUTH DAILY 90 tablet 2  . spironolactone (ALDACTONE) 25 MG tablet Take 12.5 mg by mouth daily.    Marland Kitchen spironolactone-hydrochlorothiazide (ALDACTAZIDE) 25-25 MG tablet Take 0.5 tablets by mouth daily.    . sucralfate (CARAFATE) 1 g tablet Take 1 g by mouth 4 (four) times daily -  with meals and at bedtime.    . SYMBICORT 160-4.5 MCG/ACT inhaler Inhale 2 puffs into the lungs 2 (two) times daily. 30.8 g 2  . tamsulosin (FLOMAX) 0.4 MG CAPS capsule TAKE 1 CAPSULE (.4MG TOTAL) BY MOUTH DAILY.(GREEN AND TAN CAP WITH TAM 0.4MG) 90 capsule 2  . traZODone (DESYREL) 150 MG tablet Take 1 tablet (150 mg total) by mouth at bedtime. 90 tablet 2   No facility-administered medications prior to visit.    Allergies  Allergen Reactions  . Motrin [Ibuprofen] Other (See Comments)    Reaction: ulcers  . Other Nausea And Vomiting    Patient has ulcers    Review of Systems  Constitutional: Negative for activity change and appetite change.  HENT: Negative.   Respiratory: Negative for chest tightness and shortness of breath.   Cardiovascular: Negative.  Negative for chest pain, palpitations and leg  swelling.  Gastrointestinal: Negative for abdominal distention and abdominal pain.  Genitourinary: Negative.   Musculoskeletal: Positive for arthralgias (left knee) and back pain.  Neurological: Negative.   Psychiatric/Behavioral: Negative.        Objective:    Physical Exam Constitutional:      Appearance: Normal appearance. He is obese.  Cardiovascular:     Rate and Rhythm: Normal rate and regular rhythm.     Pulses: Normal pulses.     Heart sounds: Normal heart sounds. No murmur heard. No gallop.   Pulmonary:     Effort: No respiratory distress.     Breath sounds: Normal breath sounds. No rales.  Musculoskeletal:        General: Tenderness present.     Left hip: Tenderness and crepitus present. Decreased range of motion.     Right knee: Crepitus present. Decreased range of motion. Tenderness present.     Instability Tests: Anterior drawer test negative. Posterior drawer test negative. Anterior Lachman test negative. Medial McMurray test negative and lateral McMurray test negative.     Left knee: Bony tenderness and crepitus present. Decreased range of motion. Tenderness present.     Instability Tests: Anterior drawer test negative. Posterior drawer test negative. Anterior Lachman test negative. Medial McMurray test negative and lateral McMurray test negative.       Legs:  Neurological:     Mental Status: He is alert.     BP 128/86 (BP Location: Right Arm, Patient Position: Sitting, Cuff Size: Large)   Pulse 84   Temp 97.7 F (36.5 C) (Temporal)   Ht 5' 8" (1.727 m)   Wt (!) 304 lb (137.9 kg)   SpO2 97%   BMI 46.22 kg/m  Wt Readings from Last 3 Encounters:  11/21/20 (!) 304 lb (137.9 kg)  09/27/20 296 lb (134.3 kg)  07/11/20 283 lb 6.4 oz (128.5 kg)    Health Maintenance Due  Topic Date Due  . PNEUMOCOCCAL POLYSACCHARIDE VACCINE AGE 69-64 HIGH RISK  Never done  . HIV Screening  Never done  . Hepatitis C Screening  Never done  . TETANUS/TDAP  Never done  .  COVID-19 Vaccine (4 - Booster for Pfizer series) 10/11/2020    There are no preventive care reminders to display for this patient.   No results found for: TSH Lab Results  Component Value Date   WBC 9.0 09/27/2020   HGB 15.2 09/27/2020   HCT 45.5 09/27/2020   MCV 89 09/27/2020   PLT 373 09/27/2020   Lab Results  Component Value Date   NA 138 10/08/2020   K 5.1 10/08/2020   CO2 22 10/08/2020   GLUCOSE 140 (H) 10/08/2020   BUN 15 10/08/2020   CREATININE 1.33 (H) 10/08/2020   BILITOT 0.3 10/08/2020   ALKPHOS 59 10/08/2020   AST 13 10/08/2020   ALT 12 10/08/2020   PROT 6.5 10/08/2020   ALBUMIN 4.1 10/08/2020   CALCIUM 9.5 10/08/2020   ANIONGAP 11 08/06/2017   EGFR 60 10/08/2020   Lab Results  Component Value Date   CHOL 236 (H) 09/27/2020   Lab Results  Component Value Date   HDL 55 09/27/2020   Lab Results  Component Value Date   LDLCALC 165 (H) 09/27/2020   Lab Results  Component Value Date   TRIG 89 09/27/2020   Lab Results  Component Value Date   CHOLHDL 4.3 09/27/2020   Lab Results  Component Value Date   HGBA1C 6.2 (H) 09/27/2020       Assessment & Plan:   Diagnoses and all orders for this visit: Chronic pain syndrome -     Ambulatory referral to Pain Clinic Patient has chronic pain but has a history of abuse  Arthralgia of right knee -     AMB referral to orthopedics Patient has right knee pain  BMI 45.0-49.9, adult ([place]) An individualize plan was formulated for obesity using patient history and physical exam to encourage weight loss.  An evidence based program was formulated.  Patient is to cut portion size with meals and to plan physical exercise 3 days a week at least 20 minutes.  Weight watchers and other programs are helpful.  Planned amount of weight loss 10 lbs.  Morbid obesity ([place]) An individualize plan was formulated for obesity using patient history and physical exam to encourage weight loss.  An evidence based program was  formulated.  Patient is to cut portion size with meals and to plan physical exercise 3 days a week at least 20 minutes.  Weight watchers and other programs are helpful.  Planned amount of weight loss 10 lbs.  Arthralgia of left knee  Patient has TKA in past with revision.  He has secondary infection.  He also has left hip OA with limited motion The knee was injected for temporary relief After consent was obtained, using sterile technique the left knee was prepped and plain ETHYL CHLORIDE was used as local anesthetic. The joint was entered  Steroid 28m mg and 3 ml plain Lidocaine was then injected and the needle withdrawn.  The procedure was well tolerated.  The patient is asked to continue to rest the joint for a few more days before resuming regular activities.  It may be more painful for the first 1-2 days.  Watch for fever, or increased swelling or persistent pain in the joint. Call or return to clinic prn if such symptoms occur or there is failure  to improve as anticipated.     I spent 20 minutes dedicated to the care of this patient on the date of this encounter to include face-to-face time with the patient, as well as:   Follow-up: Return in about 3 months (around 02/21/2021).  An After Visit Summary was printed and given to the patient.  Reinaldo Meeker, MD [name] [phone]

## 2020-11-25 ENCOUNTER — Telehealth: Payer: Self-pay

## 2020-11-25 NOTE — Telephone Encounter (Signed)
I prefer not to lp

## 2020-11-25 NOTE — Telephone Encounter (Signed)
Pt requesting refill for oxycodone until pt gets into pain clinic. No longer on med list. Please advise.   Requesting it be sent to [facility] in [place].   Royce Macadamia, [place] 11/25/20 1:54 PM

## 2020-11-26 NOTE — Telephone Encounter (Signed)
Called spoke w/ Daughter. Daughter VU and states she is just concerned due to his recent addiction battle. Made her aware we are working on referral to pain clinic. We have faxed paperwork to pain clinic and waiting on their response.   Harrell Lark 11/26/20 8:48 AM

## 2020-11-29 DIAGNOSIS — R519 Headache, unspecified: Secondary | ICD-10-CM | POA: Diagnosis not present

## 2020-11-29 DIAGNOSIS — Z79899 Other long term (current) drug therapy: Secondary | ICD-10-CM | POA: Diagnosis not present

## 2020-11-29 DIAGNOSIS — Z20822 Contact with and (suspected) exposure to covid-19: Secondary | ICD-10-CM | POA: Diagnosis not present

## 2020-11-29 DIAGNOSIS — J449 Chronic obstructive pulmonary disease, unspecified: Secondary | ICD-10-CM | POA: Diagnosis not present

## 2020-11-29 DIAGNOSIS — Z743 Need for continuous supervision: Secondary | ICD-10-CM | POA: Diagnosis not present

## 2020-11-29 DIAGNOSIS — R21 Rash and other nonspecific skin eruption: Secondary | ICD-10-CM | POA: Diagnosis not present

## 2020-11-29 DIAGNOSIS — I252 Old myocardial infarction: Secondary | ICD-10-CM | POA: Diagnosis not present

## 2020-11-29 DIAGNOSIS — Z96653 Presence of artificial knee joint, bilateral: Secondary | ICD-10-CM | POA: Diagnosis not present

## 2020-11-29 DIAGNOSIS — R0789 Other chest pain: Secondary | ICD-10-CM | POA: Diagnosis not present

## 2020-11-29 DIAGNOSIS — E1165 Type 2 diabetes mellitus with hyperglycemia: Secondary | ICD-10-CM | POA: Diagnosis not present

## 2020-11-29 DIAGNOSIS — D72829 Elevated white blood cell count, unspecified: Secondary | ICD-10-CM | POA: Diagnosis not present

## 2020-11-29 DIAGNOSIS — R110 Nausea: Secondary | ICD-10-CM | POA: Diagnosis not present

## 2020-11-29 DIAGNOSIS — R52 Pain, unspecified: Secondary | ICD-10-CM | POA: Diagnosis not present

## 2020-11-29 DIAGNOSIS — R069 Unspecified abnormalities of breathing: Secondary | ICD-10-CM | POA: Diagnosis not present

## 2020-11-29 DIAGNOSIS — E119 Type 2 diabetes mellitus without complications: Secondary | ICD-10-CM | POA: Diagnosis not present

## 2020-11-29 DIAGNOSIS — R509 Fever, unspecified: Secondary | ICD-10-CM | POA: Diagnosis not present

## 2020-11-29 DIAGNOSIS — E785 Hyperlipidemia, unspecified: Secondary | ICD-10-CM | POA: Diagnosis not present

## 2020-11-29 DIAGNOSIS — R079 Chest pain, unspecified: Secondary | ICD-10-CM | POA: Diagnosis not present

## 2020-11-29 DIAGNOSIS — M791 Myalgia, unspecified site: Secondary | ICD-10-CM | POA: Diagnosis not present

## 2020-11-29 DIAGNOSIS — R251 Tremor, unspecified: Secondary | ICD-10-CM | POA: Diagnosis not present

## 2020-11-29 DIAGNOSIS — F17210 Nicotine dependence, cigarettes, uncomplicated: Secondary | ICD-10-CM | POA: Diagnosis not present

## 2020-11-29 DIAGNOSIS — I2510 Atherosclerotic heart disease of native coronary artery without angina pectoris: Secondary | ICD-10-CM | POA: Diagnosis not present

## 2020-11-29 DIAGNOSIS — I4519 Other right bundle-branch block: Secondary | ICD-10-CM | POA: Diagnosis not present

## 2020-11-29 DIAGNOSIS — Z7984 Long term (current) use of oral hypoglycemic drugs: Secondary | ICD-10-CM | POA: Diagnosis not present

## 2020-11-29 DIAGNOSIS — R5381 Other malaise: Secondary | ICD-10-CM | POA: Diagnosis not present

## 2020-11-29 DIAGNOSIS — G4730 Sleep apnea, unspecified: Secondary | ICD-10-CM | POA: Diagnosis not present

## 2020-11-29 DIAGNOSIS — Z794 Long term (current) use of insulin: Secondary | ICD-10-CM | POA: Diagnosis not present

## 2020-11-29 DIAGNOSIS — Z7982 Long term (current) use of aspirin: Secondary | ICD-10-CM | POA: Diagnosis not present

## 2020-11-29 DIAGNOSIS — Z7951 Long term (current) use of inhaled steroids: Secondary | ICD-10-CM | POA: Diagnosis not present

## 2020-11-29 DIAGNOSIS — R059 Cough, unspecified: Secondary | ICD-10-CM | POA: Diagnosis not present

## 2020-11-29 DIAGNOSIS — I10 Essential (primary) hypertension: Secondary | ICD-10-CM | POA: Diagnosis not present

## 2020-12-03 DIAGNOSIS — Z5189 Encounter for other specified aftercare: Secondary | ICD-10-CM | POA: Diagnosis not present

## 2020-12-04 ENCOUNTER — Other Ambulatory Visit: Payer: Self-pay

## 2020-12-04 ENCOUNTER — Ambulatory Visit (INDEPENDENT_AMBULATORY_CARE_PROVIDER_SITE_OTHER): Payer: Medicare Other | Admitting: Legal Medicine

## 2020-12-04 ENCOUNTER — Encounter: Payer: Self-pay | Admitting: Legal Medicine

## 2020-12-04 VITALS — BP 150/64 | HR 76 | Temp 96.1°F | Resp 17 | Ht 68.0 in | Wt 302.0 lb

## 2020-12-04 DIAGNOSIS — N410 Acute prostatitis: Secondary | ICD-10-CM | POA: Diagnosis not present

## 2020-12-04 DIAGNOSIS — N419 Inflammatory disease of prostate, unspecified: Secondary | ICD-10-CM

## 2020-12-04 DIAGNOSIS — G894 Chronic pain syndrome: Secondary | ICD-10-CM

## 2020-12-04 DIAGNOSIS — I25118 Atherosclerotic heart disease of native coronary artery with other forms of angina pectoris: Secondary | ICD-10-CM

## 2020-12-04 DIAGNOSIS — F1910 Other psychoactive substance abuse, uncomplicated: Secondary | ICD-10-CM | POA: Diagnosis not present

## 2020-12-04 HISTORY — DX: Inflammatory disease of prostate, unspecified: N41.9

## 2020-12-04 LAB — POCT URINALYSIS DIP (CLINITEK)
Bilirubin, UA: NEGATIVE
Blood, UA: NEGATIVE
Glucose, UA: 250 mg/dL — AB
Ketones, POC UA: NEGATIVE mg/dL
Nitrite, UA: NEGATIVE
Spec Grav, UA: >=1.03 — AB (ref 1.010–1.025)
Urobilinogen, UA: 1 E.U./dL
pH, UA: 5.5 (ref 5.0–8.0)

## 2020-12-04 MED ORDER — NITROGLYCERIN 0.4 MG SL SUBL: 0.4000 mg | SUBLINGUAL_TABLET | SUBLINGUAL | 3 refills | Status: DC | PRN

## 2020-12-04 NOTE — Progress Notes (Signed)
Subjective:  Patient ID: Brent Ortiz, male    DOB: 1960/08/04  Age: 60 y.o. MRN: [id number]  Chief Complaint  Patient presents with  . Prostatitis    HPI: patient having chest pain and transported to [facility] by ambulance. Negative cardiac workup.  He was told he has prostatitis.started levofloxacin.   Current Outpatient Medications on File Prior to Visit  Medication Sig Dispense Refill  . albuterol (VENTOLIN HFA) 108 (90 Base) MCG/ACT inhaler INHALE 2 PUFFS INTO THE LUNGS EVERY 4 HOURS AS NEEDED FOR WHEEZING OR SHORTNESS OF BREATH. 8.5 g 6  . B-D ULTRAFINE III SHORT PEN 31G X 8 MM MISC USE AS DIRECTED (ONCE DAILY WITH LANTUS INJECTION AS INSTRUCTED) 100 each 3  . colchicine-probenecid 0.5-500 MG tablet Take 1 tablet by mouth 2 (two) times daily as needed (GOUT). 180 tablet 2  . ENTRESTO 24-26 MG TAKE 1 TABLET BY MOUTH 2 TIMES DAILY.(OBLONG PINK TAB WITH LZ NVR) 62 tablet 6  . ezetimibe (ZETIA) 10 MG tablet Take 10 mg by mouth daily.    Marland Kitchen gabapentin (NEURONTIN) 300 MG capsule TAKE 1 CAPSULE (300MG ) BY MOUTH TWICE DAILY.(YELLOW CAP) 180 capsule 2  . GOODSENSE ASPIRIN LOW DOSE 81 MG EC tablet TAKE 1 TABLET (81MG ) BY MOUTH DAILY. SWALLOW WHOLE.(YELLOW ROUND TAB WITH HEART) 90 tablet 2  . indomethacin (INDOCIN) 25 MG capsule TAKE 1 CAPSULE (25MG  TOTAL) BY MOUTH 3 TIMES DAILY. (GREEN CAPSULE WITH H 103 OR WITH G406) 93 capsule 1  . LANTUS SOLOSTAR 100 UNIT/ML Solostar Pen Inject 30 Units into the skin at bedtime. 15 mL 5  . levofloxacin (LEVAQUIN) 500 MG tablet Take 500 mg by mouth daily.    . meloxicam (MOBIC) 15 MG tablet TAKE 1 TABLET (15MG  TOTAL) BY MOUTH DAILY.(OBLONG YELLOW TAB WITH UL 15) 90 tablet 2  . metFORMIN (GLUCOPHAGE) 500 MG tablet Take 1 tablet (500 mg total) by mouth daily with supper. 90 tablet 2  . metoprolol succinate (TOPROL-XL) 25 MG 24 hr tablet TAKE 1 TABLET (25 MG TOTAL) BY MOUTH DAILY.(OVAL WHITE TAB WITH 564) 90 tablet 2  . nitroGLYCERIN (NITROSTAT) 0.4 MG  SL tablet Place 1 tablet (0.4 mg total) under the tongue every 5 (five) minutes as needed. For chest pain 50 tablet 3  . pantoprazole (PROTONIX) 40 MG tablet TAKE 1 TABLET(40MG ) BY MOUTH DAILY.(OVALYELLOW TAB WITH H126) 90 tablet 2  . potassium chloride SA (KLOR-CON) 20 MEQ tablet TAKE 1 TABLET(20MEQ TOTAL) BY MOUTH 4 TIMES DAILY.(OBLONG WHITE TAB WITH KC M20) 124 tablet 6  . ranolazine (RANEXA) 500 MG 12 hr tablet Take 500 mg by mouth 2 (two) times daily.    . rosuvastatin (CRESTOR) 20 MG tablet TAKE 1 TABLET (20MG  TOTAL) BY MOUTH DAILY.(ROUND PINK TAB WITH R20) 90 tablet 2  . sertraline (ZOLOFT) 25 MG tablet TAKE 1 TABLET(25 MG) BY MOUTH DAILY 90 tablet 2  . spironolactone-hydrochlorothiazide (ALDACTAZIDE) 25-25 MG tablet Take 0.5 tablets by mouth daily.    . sucralfate (CARAFATE) 1 g tablet Take 1 g by mouth 4 (four) times daily -  with meals and at bedtime.    . SYMBICORT 160-4.5 MCG/ACT inhaler Inhale 2 puffs into the lungs 2 (two) times daily. 30.8 g 2  . tamsulosin (FLOMAX) 0.4 MG CAPS capsule TAKE 1 CAPSULE (.4MG  TOTAL) BY MOUTH DAILY.(GREEN AND TAN CAP WITH TAM 0.4MG ) 90 capsule 2  . traZODone (DESYREL) 150 MG tablet Take 1 tablet (150 mg total) by mouth at bedtime. 90 tablet 2  No current facility-administered medications on file prior to visit.   Past Medical History:  Diagnosis Date  . Acute combined systolic and diastolic CHF, NYHA class 2 ([place]) 01/22/2020  . Alcohol abuse   . Chronic pain 10/19/2019   Sees pain clinic  . Coronary artery disease   . Coronary atherosclerosis of native coronary artery 01/23/2016   Overview:  Completely occluded circumflex artery proximal to obtuse marginal 1 basin cardiac catheter from 2017  . Diabetic polyneuropathy ([place]) 12/06/2019  . ED (erectile dysfunction) 12/06/2019  . Essential hypertension 01/23/2016  . HTN (hypertension)   . Leukocytosis 09/04/2011  . Mixed hyperlipidemia 06/18/2017  . Morbid obesity with BMI of 40.0-44.9, adult ([place])  01/13/2013  . Myocardial infarction ([place])    04-13-2011  . Obstructive chronic bronchitis with exacerbation ([place]) 10/03/2019  . Opiate abuse, continuous ([place])   . Osteoarthritis   . Pain in knee joint 02/12/2012   Formatting of this note might be different from the original. Left  . Pneumonia   . Pneumonia due to infectious organism 09/04/2011  . Polysubstance abuse ([place])   . Poor dentition   . Thrombocytosis 09/29/2011  . Tobacco dependence 01/23/2016  . Tobacco use disorder 01/23/2016   Past Surgical History:  Procedure Laterality Date  . HERNIA REPAIR  2009  . I & D KNEE WITH POLY EXCHANGE  09/03/2011   Procedure: IRRIGATION AND DEBRIDEMENT KNEE WITH POLY EXCHANGE;  Surgeon: Mauri Pole, MD;  [facility];  Service: Orthopedics;  Laterality: Left;  . TOOTH EXTRACTION Bilateral 08/06/2017   Procedure: DENTAL RESTORATION/EXTRACTIONS;  Surgeon: Diona Browner, DDS;  Location: [place];  Service: Oral Surgery;  Laterality: Bilateral;  . TOTAL KNEE ARTHROPLASTY   right  feb 2012   left march 2012 r  . TOTAL KNEE REVISION  06/23/2011   Procedure: TOTAL KNEE REVISION;  Surgeon: Mauri Pole;  [facility];  Service: Orthopedics;  Laterality: Left;  femomal nerve block done in holding area without incident    Family History  Problem Relation Age of Onset  . Colon cancer Other   . Colon cancer Mother    Social History   Socioeconomic History  . Marital status: Widowed    Spouse name: Not on file  . Number of children: 5  . Years of education: Not on file  . Highest education level: Not on file  Occupational History  . Occupation: Disabled  Tobacco Use  . Smoking status: Current Every Day Smoker    Packs/day: 1.00    Years: 50.00    Pack years: 50.00    Types: Cigarettes  . Smokeless tobacco: Never Used  Vaping Use  . Vaping Use: Former  Substance and Sexual Activity  . Alcohol use: No    Comment: heavy drinker in past   . Drug use: No  . Sexual activity: Not Currently   Other Topics Concern  . Not on file  Social History Narrative  . Not on file   Social Determinants of Health   Financial Resource Strain: Low Risk   . Difficulty of Paying Living Expenses: Not very hard  Food Insecurity: No Food Insecurity  . Worried About Charity fundraiser in the Last Year: Never true  . Ran Out of Food in the Last Year: Never true  Transportation Needs: No Transportation Needs  . Lack of Transportation (Medical): No  . Lack of Transportation (Non-Medical): No  Physical Activity: Inactive  . Days of Exercise per Week: 0 days  .  Minutes of Exercise per Session: 0 min  Stress: No Stress Concern Present  . Feeling of Stress : Not at all  Social Connections: Moderately Integrated  . Frequency of Communication with Friends and Family: Twice a week  . Frequency of Social Gatherings with Friends and Family: Once a week  . Attends Religious Services: More than 4 times per year  . Active Member of Clubs or Organizations: Yes  . Attends Archivist Meetings: More than 4 times per year  . Marital Status: Widowed    Review of Systems  Constitutional: Negative for activity change and appetite change.  HENT: Negative for congestion and rhinorrhea.   Eyes: Negative for visual disturbance.  Respiratory: Positive for chest tightness. Negative for shortness of breath.   Cardiovascular: Positive for chest pain. Negative for palpitations and leg swelling.  Gastrointestinal: Negative for abdominal distention and abdominal pain.  Genitourinary: Positive for difficulty urinating and dysuria.  Musculoskeletal: Negative.   Neurological: Negative.   Psychiatric/Behavioral: Negative.      Objective:  BP (!) 150/64   Pulse 76   Temp (!) 96.1 F (35.6 C)   Resp 17   Ht 5\' 8"  (1.727 m)   Wt (!) 302 lb (137 kg)   SpO2 98%   BMI 45.92 kg/m   BP/Weight 12/04/2020 11/21/2020 01/22/9469  Systolic BP 962 836 629  Diastolic BP 64 86 80  Wt. (Lbs) 302 304 296  BMI  45.92 46.22 45.01    Physical Exam Vitals reviewed.  Constitutional:      Appearance: Normal appearance.  HENT:     Head: Normocephalic.  Eyes:     Extraocular Movements: Extraocular movements intact.     Conjunctiva/sclera: Conjunctivae normal.     Pupils: Pupils are equal, round, and reactive to light.  Cardiovascular:     Rate and Rhythm: Normal rate and regular rhythm.     Pulses: Normal pulses.     Heart sounds: Normal heart sounds. No murmur heard. No gallop.   Pulmonary:     Effort: Pulmonary effort is normal. No respiratory distress.     Breath sounds: No rales.  Abdominal:     General: Abdomen is flat. Bowel sounds are normal.     Palpations: Abdomen is soft.  Genitourinary:    Comments: Prostate enlarged and tender Musculoskeletal:        General: Normal range of motion.  Skin:    General: Skin is warm.  Neurological:     General: No focal deficit present.     Mental Status: He is alert.       Lab Results  Component Value Date   WBC 9.0 09/27/2020   HGB 15.2 09/27/2020   HCT 45.5 09/27/2020   PLT 373 09/27/2020   GLUCOSE 140 (H) 10/08/2020   CHOL 236 (H) 09/27/2020   TRIG 89 09/27/2020   HDL 55 09/27/2020   LDLCALC 165 (H) 09/27/2020   ALT 12 10/08/2020   AST 13 10/08/2020   NA 138 10/08/2020   K 5.1 10/08/2020   CL 102 10/08/2020   CREATININE 1.33 (H) 10/08/2020   BUN 15 10/08/2020   CO2 22 10/08/2020   INR 1.81 (H) 09/07/2011   HGBA1C 6.2 (H) 09/27/2020   MICROALBUR 80 09/27/2020      Assessment & Plan:   1. Atherosclerosis of coronary artery of native heart with stable angina pectoris, unspecified vessel or lesion type [facility]) - Ambulatory referral to Cardiology Patient has a history of coronary artery disease and he  presented to [facility] emergency room for this he took 2 nitroglycerin and this relieved his pain.  He had a negative cardiac work-up in the emergency room.  He has not seen cardiology in 4 to 5 years.  2. Acute  prostatitis - POCT URINALYSIS DIP ([facility]) Patient has acute prostatitis with a very tender and swollen prostate gland we will continue him on as levofloxacin.  3. Polysubstance abuse ([place]) Patient has a history  of narcotic abuse and formally was caught snorting his oxycodone.  I will not give him any more narcotic medicine but I will refer him to the pain clinic.   30 minute visit   Orders Placed This Encounter  Procedures  . Ambulatory referral to Cardiology  . POCT URINALYSIS DIP (CLINITEK)     Follow-up: No follow-ups on file.  An After Visit Summary was printed and given to the patient.  Reinaldo Meeker, MD [name] [phone]

## 2020-12-05 ENCOUNTER — Other Ambulatory Visit: Payer: Self-pay | Admitting: Legal Medicine

## 2020-12-05 ENCOUNTER — Other Ambulatory Visit: Payer: Self-pay | Admitting: Cardiology

## 2020-12-05 DIAGNOSIS — G8929 Other chronic pain: Secondary | ICD-10-CM

## 2020-12-05 DIAGNOSIS — E782 Mixed hyperlipidemia: Secondary | ICD-10-CM

## 2020-12-05 DIAGNOSIS — M25562 Pain in left knee: Secondary | ICD-10-CM

## 2020-12-05 LAB — URINE CULTURE: Organism ID, Bacteria: NO GROWTH

## 2020-12-06 NOTE — Progress Notes (Signed)
Urine culture no growth lp

## 2020-12-10 ENCOUNTER — Ambulatory Visit: Payer: Medicare Other | Admitting: Sports Medicine

## 2020-12-13 DIAGNOSIS — Z5941 Food insecurity: Secondary | ICD-10-CM | POA: Diagnosis not present

## 2020-12-13 DIAGNOSIS — R262 Difficulty in walking, not elsewhere classified: Secondary | ICD-10-CM | POA: Diagnosis not present

## 2020-12-13 DIAGNOSIS — I5041 Acute combined systolic (congestive) and diastolic (congestive) heart failure: Secondary | ICD-10-CM | POA: Diagnosis not present

## 2020-12-13 DIAGNOSIS — I209 Angina pectoris, unspecified: Secondary | ICD-10-CM | POA: Diagnosis not present

## 2020-12-13 DIAGNOSIS — I25110 Atherosclerotic heart disease of native coronary artery with unstable angina pectoris: Secondary | ICD-10-CM | POA: Diagnosis not present

## 2020-12-13 DIAGNOSIS — F32A Depression, unspecified: Secondary | ICD-10-CM | POA: Diagnosis not present

## 2020-12-13 DIAGNOSIS — Z720 Tobacco use: Secondary | ICD-10-CM | POA: Diagnosis not present

## 2020-12-13 DIAGNOSIS — J449 Chronic obstructive pulmonary disease, unspecified: Secondary | ICD-10-CM | POA: Diagnosis not present

## 2020-12-13 DIAGNOSIS — I130 Hypertensive heart and chronic kidney disease with heart failure and stage 1 through stage 4 chronic kidney disease, or unspecified chronic kidney disease: Secondary | ICD-10-CM | POA: Diagnosis not present

## 2020-12-13 DIAGNOSIS — Z791 Long term (current) use of non-steroidal anti-inflammatories (NSAID): Secondary | ICD-10-CM | POA: Diagnosis not present

## 2020-12-13 DIAGNOSIS — R6889 Other general symptoms and signs: Secondary | ICD-10-CM | POA: Diagnosis not present

## 2020-12-13 DIAGNOSIS — R0602 Shortness of breath: Secondary | ICD-10-CM | POA: Diagnosis not present

## 2020-12-13 DIAGNOSIS — K219 Gastro-esophageal reflux disease without esophagitis: Secondary | ICD-10-CM | POA: Diagnosis not present

## 2020-12-13 DIAGNOSIS — I252 Old myocardial infarction: Secondary | ICD-10-CM | POA: Diagnosis not present

## 2020-12-13 DIAGNOSIS — I10 Essential (primary) hypertension: Secondary | ICD-10-CM | POA: Diagnosis not present

## 2020-12-13 DIAGNOSIS — R531 Weakness: Secondary | ICD-10-CM | POA: Diagnosis not present

## 2020-12-13 DIAGNOSIS — G8929 Other chronic pain: Secondary | ICD-10-CM | POA: Diagnosis not present

## 2020-12-13 DIAGNOSIS — M109 Gout, unspecified: Secondary | ICD-10-CM | POA: Diagnosis not present

## 2020-12-13 DIAGNOSIS — G4700 Insomnia, unspecified: Secondary | ICD-10-CM | POA: Diagnosis not present

## 2020-12-13 DIAGNOSIS — I25119 Atherosclerotic heart disease of native coronary artery with unspecified angina pectoris: Secondary | ICD-10-CM | POA: Diagnosis not present

## 2020-12-13 DIAGNOSIS — E1142 Type 2 diabetes mellitus with diabetic polyneuropathy: Secondary | ICD-10-CM | POA: Diagnosis not present

## 2020-12-13 DIAGNOSIS — R0789 Other chest pain: Secondary | ICD-10-CM | POA: Diagnosis not present

## 2020-12-13 DIAGNOSIS — I2510 Atherosclerotic heart disease of native coronary artery without angina pectoris: Secondary | ICD-10-CM | POA: Diagnosis not present

## 2020-12-13 DIAGNOSIS — E1165 Type 2 diabetes mellitus with hyperglycemia: Secondary | ICD-10-CM | POA: Diagnosis not present

## 2020-12-13 DIAGNOSIS — R42 Dizziness and giddiness: Secondary | ICD-10-CM | POA: Diagnosis not present

## 2020-12-13 DIAGNOSIS — E785 Hyperlipidemia, unspecified: Secondary | ICD-10-CM | POA: Diagnosis not present

## 2020-12-13 DIAGNOSIS — I5033 Acute on chronic diastolic (congestive) heart failure: Secondary | ICD-10-CM | POA: Diagnosis not present

## 2020-12-13 DIAGNOSIS — K5900 Constipation, unspecified: Secondary | ICD-10-CM | POA: Diagnosis not present

## 2020-12-13 DIAGNOSIS — M6281 Muscle weakness (generalized): Secondary | ICD-10-CM | POA: Diagnosis not present

## 2020-12-13 DIAGNOSIS — R5381 Other malaise: Secondary | ICD-10-CM | POA: Diagnosis not present

## 2020-12-13 DIAGNOSIS — R609 Edema, unspecified: Secondary | ICD-10-CM | POA: Diagnosis not present

## 2020-12-13 DIAGNOSIS — M7989 Other specified soft tissue disorders: Secondary | ICD-10-CM | POA: Diagnosis not present

## 2020-12-13 DIAGNOSIS — R252 Cramp and spasm: Secondary | ICD-10-CM | POA: Diagnosis not present

## 2020-12-13 DIAGNOSIS — J811 Chronic pulmonary edema: Secondary | ICD-10-CM | POA: Diagnosis not present

## 2020-12-13 DIAGNOSIS — R079 Chest pain, unspecified: Secondary | ICD-10-CM | POA: Diagnosis not present

## 2020-12-13 DIAGNOSIS — Z741 Need for assistance with personal care: Secondary | ICD-10-CM | POA: Diagnosis not present

## 2020-12-13 DIAGNOSIS — Z7951 Long term (current) use of inhaled steroids: Secondary | ICD-10-CM | POA: Diagnosis not present

## 2020-12-13 DIAGNOSIS — I2582 Chronic total occlusion of coronary artery: Secondary | ICD-10-CM | POA: Diagnosis not present

## 2020-12-13 DIAGNOSIS — R059 Cough, unspecified: Secondary | ICD-10-CM | POA: Diagnosis not present

## 2020-12-13 DIAGNOSIS — I5023 Acute on chronic systolic (congestive) heart failure: Secondary | ICD-10-CM | POA: Diagnosis not present

## 2020-12-13 DIAGNOSIS — Z794 Long term (current) use of insulin: Secondary | ICD-10-CM | POA: Diagnosis not present

## 2020-12-13 DIAGNOSIS — Z20822 Contact with and (suspected) exposure to covid-19: Secondary | ICD-10-CM | POA: Diagnosis not present

## 2020-12-13 DIAGNOSIS — H814 Vertigo of central origin: Secondary | ICD-10-CM | POA: Diagnosis not present

## 2020-12-13 DIAGNOSIS — M25562 Pain in left knee: Secondary | ICD-10-CM | POA: Diagnosis not present

## 2020-12-13 DIAGNOSIS — N189 Chronic kidney disease, unspecified: Secondary | ICD-10-CM | POA: Diagnosis not present

## 2020-12-13 DIAGNOSIS — Z7982 Long term (current) use of aspirin: Secondary | ICD-10-CM | POA: Diagnosis not present

## 2020-12-13 DIAGNOSIS — E782 Mixed hyperlipidemia: Secondary | ICD-10-CM | POA: Diagnosis not present

## 2020-12-13 DIAGNOSIS — Z743 Need for continuous supervision: Secondary | ICD-10-CM | POA: Diagnosis not present

## 2020-12-13 DIAGNOSIS — I200 Unstable angina: Secondary | ICD-10-CM | POA: Diagnosis not present

## 2020-12-13 DIAGNOSIS — I5030 Unspecified diastolic (congestive) heart failure: Secondary | ICD-10-CM | POA: Diagnosis not present

## 2020-12-13 DIAGNOSIS — R072 Precordial pain: Secondary | ICD-10-CM | POA: Diagnosis not present

## 2020-12-13 DIAGNOSIS — E1122 Type 2 diabetes mellitus with diabetic chronic kidney disease: Secondary | ICD-10-CM | POA: Diagnosis not present

## 2020-12-13 DIAGNOSIS — F17210 Nicotine dependence, cigarettes, uncomplicated: Secondary | ICD-10-CM | POA: Diagnosis not present

## 2020-12-13 DIAGNOSIS — I5020 Unspecified systolic (congestive) heart failure: Secondary | ICD-10-CM | POA: Diagnosis not present

## 2020-12-14 DIAGNOSIS — R079 Chest pain, unspecified: Secondary | ICD-10-CM | POA: Diagnosis not present

## 2020-12-15 DIAGNOSIS — R079 Chest pain, unspecified: Secondary | ICD-10-CM | POA: Diagnosis not present

## 2020-12-24 DIAGNOSIS — T8484XA Pain due to internal orthopedic prosthetic devices, implants and grafts, initial encounter: Secondary | ICD-10-CM | POA: Diagnosis not present

## 2020-12-24 DIAGNOSIS — I5030 Unspecified diastolic (congestive) heart failure: Secondary | ICD-10-CM | POA: Diagnosis not present

## 2020-12-24 DIAGNOSIS — M79603 Pain in arm, unspecified: Secondary | ICD-10-CM | POA: Diagnosis not present

## 2020-12-24 DIAGNOSIS — Z743 Need for continuous supervision: Secondary | ICD-10-CM | POA: Diagnosis not present

## 2020-12-24 DIAGNOSIS — R6889 Other general symptoms and signs: Secondary | ICD-10-CM | POA: Diagnosis not present

## 2020-12-24 DIAGNOSIS — M109 Gout, unspecified: Secondary | ICD-10-CM | POA: Diagnosis not present

## 2020-12-24 DIAGNOSIS — H814 Vertigo of central origin: Secondary | ICD-10-CM | POA: Diagnosis not present

## 2020-12-24 DIAGNOSIS — N189 Chronic kidney disease, unspecified: Secondary | ICD-10-CM | POA: Diagnosis not present

## 2020-12-24 DIAGNOSIS — I10 Essential (primary) hypertension: Secondary | ICD-10-CM | POA: Diagnosis not present

## 2020-12-24 DIAGNOSIS — K219 Gastro-esophageal reflux disease without esophagitis: Secondary | ICD-10-CM | POA: Diagnosis not present

## 2020-12-24 DIAGNOSIS — I25119 Atherosclerotic heart disease of native coronary artery with unspecified angina pectoris: Secondary | ICD-10-CM | POA: Diagnosis not present

## 2020-12-24 DIAGNOSIS — R42 Dizziness and giddiness: Secondary | ICD-10-CM | POA: Diagnosis not present

## 2020-12-24 DIAGNOSIS — I252 Old myocardial infarction: Secondary | ICD-10-CM | POA: Diagnosis not present

## 2020-12-24 DIAGNOSIS — E785 Hyperlipidemia, unspecified: Secondary | ICD-10-CM | POA: Diagnosis not present

## 2020-12-24 DIAGNOSIS — F32A Depression, unspecified: Secondary | ICD-10-CM | POA: Diagnosis not present

## 2020-12-24 DIAGNOSIS — Z794 Long term (current) use of insulin: Secondary | ICD-10-CM | POA: Diagnosis not present

## 2020-12-24 DIAGNOSIS — R531 Weakness: Secondary | ICD-10-CM | POA: Diagnosis not present

## 2020-12-24 DIAGNOSIS — E782 Mixed hyperlipidemia: Secondary | ICD-10-CM | POA: Diagnosis not present

## 2020-12-24 DIAGNOSIS — Z741 Need for assistance with personal care: Secondary | ICD-10-CM | POA: Diagnosis not present

## 2020-12-24 DIAGNOSIS — R262 Difficulty in walking, not elsewhere classified: Secondary | ICD-10-CM | POA: Diagnosis not present

## 2020-12-24 DIAGNOSIS — K5900 Constipation, unspecified: Secondary | ICD-10-CM | POA: Diagnosis not present

## 2020-12-24 DIAGNOSIS — M6281 Muscle weakness (generalized): Secondary | ICD-10-CM | POA: Diagnosis not present

## 2020-12-24 DIAGNOSIS — I2510 Atherosclerotic heart disease of native coronary artery without angina pectoris: Secondary | ICD-10-CM | POA: Diagnosis not present

## 2020-12-24 DIAGNOSIS — J449 Chronic obstructive pulmonary disease, unspecified: Secondary | ICD-10-CM | POA: Diagnosis not present

## 2020-12-24 DIAGNOSIS — E1169 Type 2 diabetes mellitus with other specified complication: Secondary | ICD-10-CM | POA: Diagnosis not present

## 2020-12-24 DIAGNOSIS — Z96652 Presence of left artificial knee joint: Secondary | ICD-10-CM | POA: Diagnosis not present

## 2020-12-24 DIAGNOSIS — Z7982 Long term (current) use of aspirin: Secondary | ICD-10-CM | POA: Diagnosis not present

## 2020-12-24 DIAGNOSIS — Z720 Tobacco use: Secondary | ICD-10-CM | POA: Diagnosis not present

## 2020-12-24 DIAGNOSIS — F17210 Nicotine dependence, cigarettes, uncomplicated: Secondary | ICD-10-CM | POA: Diagnosis not present

## 2020-12-24 DIAGNOSIS — N1832 Chronic kidney disease, stage 3b: Secondary | ICD-10-CM | POA: Diagnosis not present

## 2020-12-24 DIAGNOSIS — G8929 Other chronic pain: Secondary | ICD-10-CM | POA: Diagnosis not present

## 2020-12-24 DIAGNOSIS — R609 Edema, unspecified: Secondary | ICD-10-CM | POA: Diagnosis not present

## 2020-12-24 DIAGNOSIS — M25562 Pain in left knee: Secondary | ICD-10-CM | POA: Diagnosis not present

## 2020-12-24 DIAGNOSIS — F17200 Nicotine dependence, unspecified, uncomplicated: Secondary | ICD-10-CM | POA: Diagnosis not present

## 2020-12-24 DIAGNOSIS — I130 Hypertensive heart and chronic kidney disease with heart failure and stage 1 through stage 4 chronic kidney disease, or unspecified chronic kidney disease: Secondary | ICD-10-CM | POA: Diagnosis not present

## 2020-12-24 DIAGNOSIS — R279 Unspecified lack of coordination: Secondary | ICD-10-CM | POA: Diagnosis not present

## 2020-12-24 DIAGNOSIS — I200 Unstable angina: Secondary | ICD-10-CM | POA: Diagnosis not present

## 2020-12-24 DIAGNOSIS — Z79899 Other long term (current) drug therapy: Secondary | ICD-10-CM | POA: Diagnosis not present

## 2020-12-24 DIAGNOSIS — I5041 Acute combined systolic (congestive) and diastolic (congestive) heart failure: Secondary | ICD-10-CM | POA: Diagnosis not present

## 2020-12-24 DIAGNOSIS — E119 Type 2 diabetes mellitus without complications: Secondary | ICD-10-CM | POA: Diagnosis not present

## 2020-12-24 DIAGNOSIS — R5381 Other malaise: Secondary | ICD-10-CM | POA: Diagnosis not present

## 2020-12-24 DIAGNOSIS — E1142 Type 2 diabetes mellitus with diabetic polyneuropathy: Secondary | ICD-10-CM | POA: Diagnosis not present

## 2020-12-24 DIAGNOSIS — E1165 Type 2 diabetes mellitus with hyperglycemia: Secondary | ICD-10-CM | POA: Diagnosis not present

## 2020-12-26 ENCOUNTER — Encounter: Payer: Self-pay | Admitting: *Deleted

## 2020-12-26 ENCOUNTER — Encounter: Payer: Self-pay | Admitting: Cardiology

## 2020-12-27 ENCOUNTER — Ambulatory Visit (INDEPENDENT_AMBULATORY_CARE_PROVIDER_SITE_OTHER): Payer: Medicare Other | Admitting: Cardiology

## 2020-12-27 ENCOUNTER — Other Ambulatory Visit: Payer: Self-pay

## 2020-12-27 ENCOUNTER — Ambulatory Visit (INDEPENDENT_AMBULATORY_CARE_PROVIDER_SITE_OTHER): Payer: Medicare Other

## 2020-12-27 ENCOUNTER — Encounter: Payer: Self-pay | Admitting: Cardiology

## 2020-12-27 VITALS — BP 116/78 | HR 85 | Ht 68.0 in | Wt 312.0 lb

## 2020-12-27 DIAGNOSIS — I2510 Atherosclerotic heart disease of native coronary artery without angina pectoris: Secondary | ICD-10-CM | POA: Insufficient documentation

## 2020-12-27 DIAGNOSIS — R42 Dizziness and giddiness: Secondary | ICD-10-CM

## 2020-12-27 DIAGNOSIS — N1832 Chronic kidney disease, stage 3b: Secondary | ICD-10-CM

## 2020-12-27 DIAGNOSIS — I10 Essential (primary) hypertension: Secondary | ICD-10-CM | POA: Diagnosis not present

## 2020-12-27 DIAGNOSIS — E782 Mixed hyperlipidemia: Secondary | ICD-10-CM

## 2020-12-27 HISTORY — DX: Chronic kidney disease, stage 3b: N18.32

## 2020-12-27 NOTE — Patient Instructions (Signed)
Medication Instructions:  Your physician recommends that you continue on your current medications as directed. Please refer to the Current Medication list given to you today.  *If you need a refill on your cardiac medications before your next appointment, please call your pharmacy*   Lab Work: Your physician recommends that you return for lab work in: [place], [place] If you have labs (blood work) drawn today and your tests are completely normal, you will receive your results only by: [place] (if you have MyChart) OR A paper copy in the mail If you have any lab test that is abnormal or we need to change your treatment, we will call you to review the results.   Testing/Procedures: A zio monitor was ordered today. It will remain on for 7 days. You will then return monitor and event diary in provided box. It takes 1-2 weeks for report to be downloaded and returned to [place]. We will call you with the results. If monitor falls off or has orange flashing light, please call Zio for further instructions.     Follow-Up: At [facility], you and your health needs are our priority.  As part of our continuing mission to provide you with exceptional heart care, we have created designated Provider Care Teams.  These Care Teams include your primary Cardiologist (physician) and Advanced Practice Providers (APPs -  Physician Assistants and Nurse Practitioners) who all work together to provide you with the care you need, when you need it.  We recommend signing up for the patient portal called "MyChart".  Sign up information is provided on this After Visit Summary.  MyChart is used to connect with patients for Virtual Visits (Telemedicine).  Patients are able to view lab/test results, encounter notes, upcoming appointments, etc.  Non-urgent messages can be sent to your provider as well.   To learn more about what you can do with MyChart, go to [email or web address].    Your next appointment:   6  month(s)  The format for your next appointment:   In Person  Provider:   Berniece Salines, DO   Other Instructions

## 2020-12-27 NOTE — Progress Notes (Signed)
Cardiology Office Note:    Date:  12/27/2020   ID:  Brent Ortiz, DOB May 11, 1961, MRN [id number]  PCP:  Abigail Miyamoto, MD  Cardiologist:  None  Electrophysiologist:  None   Referring MD: Charlynne Pander, MD   No chief complaint on file. -Having some dizziness other than that I am doing a lot better  History of Present Illness:    Brent Ortiz is a 60 y.o. male with a hx of history of coronary artery disease recent heart catheterization done at [facility] on December 17, 2020 showed chronic circumflex occlusion, mild LAD disease, his antianginal was optimized, heart failure with midrange ejection fraction which is now improved to 55 to 60% on his most recent echocardiogram, hypertension, type 2 diabetes with neuropathy hemoglobin A1c in March was 6.2, chronic kidney disease, hyperlipidemia and obesity.  The patient was recently discharged from [facility] on December 24, 2020 after he presented with chest pain had a Cardiac catheterization and optimization of his cardiac medications.  He has been asked to see cardiology in follow-up.  Today he tells me that he has been experiencing some dizziness.  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, unspecified vessel or lesion type (HCC) 01/23/2016   Overview:  Completely occluded circumflex artery proximal to obtuse marginal 1 basin cardiac catheter from 2017   Chronic kidney disease    Chronic pain 10/19/2019   Sees pain clinic   COPD (chronic obstructive pulmonary disease) (HCC)    Coronary artery disease involving native heart 02/08/2020   Coronary atherosclerosis of native coronary artery 01/23/2016   Overview:  Completely occluded circumflex artery proximal to obtuse marginal 1 basin cardiac catheter from 2017   Depression, major, single episode, moderate  (HCC) 07/11/2020   Diabetic polyneuropathy (HCC) 12/06/2019   ED (erectile dysfunction) 12/06/2019   Essential hypertension 01/23/2016   Essential thrombocythemia (HCC) 09/29/2011   GERD without esophagitis    HFrEF (heart failure with reduced ejection fraction) (HCC) 02/08/2020   Knee pain, left 02/12/2012   Formatting of this note might be different from the original. Left   Leukocytosis 09/04/2011   Mixed hyperlipidemia 06/18/2017   Morbid obesity (HCC) 11/21/2020   Morbid obesity with BMI of 40.0-44.9, adult (HCC) 01/13/2013   Myocardial infarction (HCC)    04-13-2011   Obstructive chronic bronchitis with exacerbation (HCC) 10/03/2019   Opiate abuse, continuous (HCC)    Osteoarthritis    Pain in knee joint 02/12/2012   Formatting of this note might be different from the original. Left   Pneumonia    Pneumonia due to infectious organism 09/04/2011   Polysubstance abuse (HCC)    Poor dentition    Prostatitis 12/04/2020   Thrombocytosis 09/29/2011   Tobacco dependence 01/23/2016   Type 2 diabetes mellitus with diabetic polyneuropathy [facility])     Past Surgical History:  Procedure Laterality Date   HERNIA REPAIR  2009   I & D KNEE WITH POLY EXCHANGE  09/03/2011   Procedure: IRRIGATION AND DEBRIDEMENT KNEE WITH POLY EXCHANGE;  Surgeon: Shelda Pal, MD;  Location: [facility] ORS;  Service: Orthopedics;  Laterality: Left;   TOOTH EXTRACTION Bilateral 08/06/2017   Procedure: DENTAL RESTORATION/EXTRACTIONS;  Surgeon: Ocie Doyne, DDS;  Location: [facility] OR;  Service: Oral Surgery;  Laterality: Bilateral;   TOTAL KNEE ARTHROPLASTY  right  feb 2012   left march 2012 r   TOTAL KNEE REVISION  06/23/2011   Procedure: TOTAL KNEE REVISION;  Surgeon: Mauri Pole;  Location: [facility] ORS;  Service: Orthopedics;  Laterality: Left;  femomal nerve block done in holding area without incident    Current Medications: Current Meds  Medication Sig   acetaminophen (TYLENOL) 325 MG tablet Take 650 mg by mouth  every 4 (four) hours as needed for mild pain.   albuterol (VENTOLIN HFA) 108 (90 Base) MCG/ACT inhaler INHALE 2 PUFFS INTO THE LUNGS EVERY 4 HOURS AS NEEDED FOR WHEEZING OR SHORTNESS OF BREATH.   amLODipine (NORVASC) 5 MG tablet Take 5 mg by mouth daily.   atorvastatin (LIPITOR) 80 MG tablet Take 80 mg by mouth daily.   diclofenac Sodium (VOLTAREN) 1 % GEL Apply 2 g topically 4 (four) times daily.   ezetimibe (ZETIA) 10 MG tablet Take 10 mg by mouth daily.   gabapentin (NEURONTIN) 300 MG capsule Take 300 mg by mouth 3 (three) times daily.   Glucagon, rDNA, (GLUCAGON EMERGENCY) 1 MG KIT Inject 1 mg into the muscle as needed (BG less than 70).   GOODSENSE ASPIRIN LOW DOSE 81 MG EC tablet TAKE 1 TABLET ($RemoveB'81MG'ILVQugkS$ ) BY MOUTH DAILY. SWALLOW WHOLE.(YELLOW ROUND TAB WITH HEART)   insulin glargine (LANTUS) 100 UNIT/ML injection Inject 20 Units into the skin daily.   isosorbide mononitrate (IMDUR) 60 MG 24 hr tablet Take 60 mg by mouth daily.   meclizine (ANTIVERT) 12.5 MG tablet Take 12.5 mg by mouth 3 (three) times daily as needed for dizziness.   metFORMIN (GLUCOPHAGE) 500 MG tablet Take 500 mg by mouth 2 (two) times daily with a meal.   metoprolol succinate (TOPROL-XL) 25 MG 24 hr tablet TAKE 1 TABLET (25 MG TOTAL) BY MOUTH DAILY.(OVAL WHITE TAB WITH 564)   nicotine (NICODERM CQ - DOSED IN MG/24 HOURS) 14 mg/24hr patch Place 14 mg onto the skin daily.   nitroGLYCERIN (NITROSTAT) 0.4 MG SL tablet Place 1 tablet (0.4 mg total) under the tongue every 5 (five) minutes as needed. For chest pain   pantoprazole (PROTONIX) 40 MG tablet TAKE 1 TABLET($RemoveBefor'40MG'ifDXsKpCwwSk$ ) BY MOUTH DAILY.(OVALYELLOW TAB WITH H126)   polyethylene glycol (MIRALAX / GLYCOLAX) 17 g packet Take 17 g by mouth daily.   ranolazine (RANEXA) 500 MG 12 hr tablet Take 500 mg by mouth 2 (two) times daily.   sertraline (ZOLOFT) 25 MG tablet TAKE 1 TABLET(25 MG) BY MOUTH DAILY   SYMBICORT 160-4.5 MCG/ACT inhaler Inhale 2 puffs into the lungs 2 (two) times daily.    tamsulosin (FLOMAX) 0.4 MG CAPS capsule TAKE 1 CAPSULE (.$RemoveBef'4MG'oFqbWclhBY$  TOTAL) BY MOUTH DAILY.(GREEN AND TAN CAP WITH TAM 0.$RemoveBe'4MG'XVgegVpAa$ )   traZODone (DESYREL) 150 MG tablet Take 1 tablet (150 mg total) by mouth at bedtime.     Allergies:   Motrin [ibuprofen] and Other   Social History   Socioeconomic History   Marital status: Widowed    Spouse name: Not on file   Number of children: 5   Years of education: Not on file   Highest education level: Not on file  Occupational History   Occupation: Disabled  Tobacco Use   Smoking status: Every Day    Packs/day: 1.00    Years: 50.00    Pack years: 50.00    Types: Cigarettes   Smokeless tobacco: Never  Vaping Use   Vaping Use: Former  Substance and Sexual Activity   Alcohol use: No    Comment: heavy  drinker in past    Drug use: No   Sexual activity: Not Currently  Other Topics Concern   Not on file  Social History Narrative   Not on file   Social Determinants of Health   Financial Resource Strain: Low Risk    Difficulty of Paying Living Expenses: Not very hard  Food Insecurity: No Food Insecurity   Worried About Running Out of Food in the Last Year: Never true   Ran Out of Food in the Last Year: Never true  Transportation Needs: No Transportation Needs   Lack of Transportation (Medical): No   Lack of Transportation (Non-Medical): No  Physical Activity: Inactive   Days of Exercise per Week: 0 days   Minutes of Exercise per Session: 0 min  Stress: No Stress Concern Present   Feeling of Stress : Not at all  Social Connections: Moderately Integrated   Frequency of Communication with Friends and Family: Twice a week   Frequency of Social Gatherings with Friends and Family: Once a week   Attends Religious Services: More than 4 times per year   Active Member of [employer] or Organizations: Yes   Attends Archivist Meetings: More than 4 times per year   Marital Status: Widowed     Family History: The patient's family history includes  Colon cancer in his mother and another family member.  ROS:   Review of Systems  Constitution: Negative for decreased appetite, fever and weight gain.  HENT: Negative for congestion, ear discharge, hoarse voice and sore throat.   Eyes: Negative for discharge, redness, vision loss in right eye and visual halos.  Cardiovascular: Negative for chest pain, dyspnea on exertion, leg swelling, orthopnea and palpitations.  Respiratory: Negative for cough, hemoptysis, shortness of breath and snoring.   Endocrine: Negative for heat intolerance and polyphagia.  Hematologic/Lymphatic: Negative for bleeding problem. Does not bruise/bleed easily.  Skin: Negative for flushing, nail changes, rash and suspicious lesions.  Musculoskeletal: Negative for arthritis, joint pain, muscle cramps, myalgias, neck pain and stiffness.  Gastrointestinal: Negative for abdominal pain, bowel incontinence, diarrhea and excessive appetite.  Genitourinary: Negative for decreased libido, genital sores and incomplete emptying.  Neurological: Negative for brief paralysis, focal weakness, headaches and loss of balance.  Psychiatric/Behavioral: Negative for altered mental status, depression and suicidal ideas.  Allergic/Immunologic: Negative for HIV exposure and persistent infections.    EKGs/Labs/Other Studies Reviewed:    The following studies were reviewed today:   EKG:  The ekg ordered today demonstrates sinus rhythm, heart rate 85 bpm  Recent Labs: 01/22/2020: BNP 81.6 02/08/2020: Magnesium 1.6 09/27/2020: Hemoglobin 15.2; Platelets 373 10/08/2020: ALT 12; BUN 15; Creatinine, Ser 1.33; Potassium 5.1; Sodium 138  Recent Lipid Panel    Component Value Date/Time   CHOL 236 (H) 09/27/2020 0908   TRIG 89 09/27/2020 0908   HDL 55 09/27/2020 0908   CHOLHDL 4.3 09/27/2020 0908   LDLCALC 165 (H) 09/27/2020 0908    Physical Exam:    VS:  BP 116/78 (BP Location: Left Arm)   Pulse 85   Ht $R'5\' 8"'eU$  (1.727 m)   Wt (!) 312 lb  (141.5 kg)   SpO2 95%   BMI 47.44 kg/m     Wt Readings from Last 3 Encounters:  12/27/20 (!) 312 lb (141.5 kg)  12/04/20 (!) 302 lb (137 kg)  11/21/20 (!) 304 lb (137.9 kg)     GEN: Well nourished, well developed in no acute distress HEENT: Normal NECK: No JVD; No carotid bruits LYMPHATICS:  No lymphadenopathy CARDIAC: S1S2 noted,RRR, no murmurs, rubs, gallops RESPIRATORY:  Clear to auscultation without rales, wheezing or rhonchi  ABDOMEN: Soft, non-tender, non-distended, +bowel sounds, no guarding. EXTREMITIES: No edema, No cyanosis, no clubbing MUSCULOSKELETAL:  No deformity  SKIN: Warm and dry NEUROLOGIC:  Alert and oriented x 3, non-focal PSYCHIATRIC:  Normal affect, good insight  ASSESSMENT:    1. Dizziness   2. Essential hypertension   3. Coronary artery disease involving native coronary artery of native heart without angina pectoris   4. Stage 3b chronic kidney disease ([place])   5. Mixed hyperlipidemia   6. Morbid obesity ([place])    PLAN:    I would like to rule out a cardiovascular etiology of this dizziness, therefore at this time I would like to placed a zio patch for  7  days. In terms of his coronary artery disease no angina symptoms we will continue patient on his current medication regimen for antianginals as well as his antiplatelet with aspirin 81 mg daily.  Hyperlipidemia - continue with current statin medication.  This is being managed by his primary care doctor.  No adjustments for antidiabetic medications were made today.  Blood pressure is acceptable, continue with current antihypertensive regimen. The patient understands the need to lose weight with diet and exercise. We have discussed specific strategies for this.   The patient is in agreement with the above plan. The patient left the office in stable condition.  The patient will follow up in 6 months or sooner if needed.   Medication Adjustments/Labs and Tests Ordered: Current medicines are reviewed  at length with the patient today.  Concerns regarding medicines are outlined above.  Orders Placed This Encounter  Procedures   Basic metabolic panel   Magnesium   LONG TERM MONITOR (3-14 DAYS)   EKG 12-Lead   No orders of the defined types were placed in this encounter.   Patient Instructions  Medication Instructions:  Your physician recommends that you continue on your current medications as directed. Please refer to the Current Medication list given to you today.  *If you need a refill on your cardiac medications before your next appointment, please call your pharmacy*   Lab Work: Your physician recommends that you return for lab work in: [place], [place] If you have labs (blood work) drawn today and your tests are completely normal, you will receive your results only by: [place] (if you have MyChart) OR A paper copy in the mail If you have any lab test that is abnormal or we need to change your treatment, we will call you to review the results.   Testing/Procedures: A zio monitor was ordered today. It will remain on for 7 days. You will then return monitor and event diary in provided box. It takes 1-2 weeks for report to be downloaded and returned to [place]. We will call you with the results. If monitor falls off or has orange flashing light, please call Zio for further instructions.     Follow-Up: At [facility], you and your health needs are our priority.  As part of our continuing mission to provide you with exceptional heart care, we have created designated Provider Care Teams.  These Care Teams include your primary Cardiologist (physician) and Advanced Practice Providers (APPs -  Physician Assistants and Nurse Practitioners) who all work together to provide you with the care you need, when you need it.  We recommend signing up for the patient portal called "MyChart".  Sign up information  is provided on this After Visit Summary.  MyChart is used to connect with  patients for Virtual Visits (Telemedicine).  Patients are able to view lab/test results, encounter notes, upcoming appointments, etc.  Non-urgent messages can be sent to your provider as well.   To learn more about what you can do with MyChart, go to [email or web address].    Your next appointment:   6 month(s)  The format for your next appointment:   In Person  Provider:   Berniece Salines, DO   Other Instructions    Adopting a Healthy Lifestyle.  Know what a healthy weight is for you (roughly BMI <25) and aim to maintain this   Aim for 7+ servings of fruits and vegetables daily   65-80+ fluid ounces of water or unsweet tea for healthy kidneys   Limit to max 1 drink of alcohol per day; avoid smoking/tobacco   Limit animal fats in diet for cholesterol and heart health - choose grass fed whenever available   Avoid highly processed foods, and foods high in saturated/trans fats   Aim for low stress - take time to unwind and care for your mental health   Aim for 150 min of moderate intensity exercise weekly for heart health, and weights twice weekly for bone health   Aim for 7-9 hours of sleep daily   When it comes to diets, agreement about the perfect plan isnt easy to find, even among the experts. Experts at the [employer] developed an idea known as the Healthy Eating Plate. Just imagine a plate divided into logical, healthy portions.   The emphasis is on diet quality:   Load up on vegetables and fruits - one-half of your plate: Aim for color and variety, and remember that potatoes dont count.   Go for whole grains - one-quarter of your plate: Whole wheat, barley, wheat berries, quinoa, oats, brown rice, and foods made with them. If you want pasta, go with whole wheat pasta.   Protein power - one-quarter of your plate: Fish, chicken, beans, and nuts are all healthy, versatile protein sources. Limit red meat.   The diet, however, does go beyond the  plate, offering a few other suggestions.   Use healthy plant oils, such as olive, canola, soy, corn, sunflower and peanut. Check the labels, and avoid partially hydrogenated oil, which have unhealthy trans fats.   If youre thirsty, drink water. Coffee and tea are good in moderation, but skip sugary drinks and limit milk and dairy products to one or two daily servings.   The type of carbohydrate in the diet is more important than the amount. Some sources of carbohydrates, such as vegetables, fruits, whole grains, and beans-are healthier than others.   Finally, stay active  Signed, Berniece Salines, DO  12/27/2020 2:20 PM    [facility]

## 2020-12-28 LAB — MAGNESIUM: Magnesium: 1.8 mg/dL (ref 1.6–2.3)

## 2020-12-28 LAB — BASIC METABOLIC PANEL
BUN/Creatinine Ratio: 15 (ref 10–24)
BUN: 18 mg/dL (ref 8–27)
CO2: 22 mmol/L (ref 20–29)
Calcium: 9.4 mg/dL (ref 8.6–10.2)
Chloride: 97 mmol/L (ref 96–106)
Creatinine, Ser: 1.23 mg/dL (ref 0.76–1.27)
Glucose: 152 mg/dL — ABNORMAL HIGH (ref 65–99)
Potassium: 4.5 mmol/L (ref 3.5–5.2)
Sodium: 135 mmol/L (ref 134–144)
eGFR: 66 mL/min/{1.73_m2} (ref >59)

## 2020-12-30 DIAGNOSIS — I252 Old myocardial infarction: Secondary | ICD-10-CM | POA: Diagnosis not present

## 2020-12-30 DIAGNOSIS — F17210 Nicotine dependence, cigarettes, uncomplicated: Secondary | ICD-10-CM | POA: Diagnosis not present

## 2020-12-30 DIAGNOSIS — J449 Chronic obstructive pulmonary disease, unspecified: Secondary | ICD-10-CM | POA: Diagnosis not present

## 2020-12-30 DIAGNOSIS — R5381 Other malaise: Secondary | ICD-10-CM | POA: Diagnosis not present

## 2020-12-30 DIAGNOSIS — E119 Type 2 diabetes mellitus without complications: Secondary | ICD-10-CM | POA: Diagnosis not present

## 2020-12-30 DIAGNOSIS — Z794 Long term (current) use of insulin: Secondary | ICD-10-CM | POA: Diagnosis not present

## 2020-12-30 DIAGNOSIS — I2510 Atherosclerotic heart disease of native coronary artery without angina pectoris: Secondary | ICD-10-CM | POA: Diagnosis not present

## 2020-12-30 DIAGNOSIS — E1142 Type 2 diabetes mellitus with diabetic polyneuropathy: Secondary | ICD-10-CM | POA: Diagnosis not present

## 2020-12-30 DIAGNOSIS — I10 Essential (primary) hypertension: Secondary | ICD-10-CM | POA: Diagnosis not present

## 2020-12-30 DIAGNOSIS — E785 Hyperlipidemia, unspecified: Secondary | ICD-10-CM | POA: Diagnosis not present

## 2020-12-30 DIAGNOSIS — Z743 Need for continuous supervision: Secondary | ICD-10-CM | POA: Diagnosis not present

## 2020-12-30 DIAGNOSIS — I5041 Acute combined systolic (congestive) and diastolic (congestive) heart failure: Secondary | ICD-10-CM | POA: Diagnosis not present

## 2020-12-30 DIAGNOSIS — K219 Gastro-esophageal reflux disease without esophagitis: Secondary | ICD-10-CM | POA: Diagnosis not present

## 2020-12-30 DIAGNOSIS — Z7982 Long term (current) use of aspirin: Secondary | ICD-10-CM | POA: Diagnosis not present

## 2020-12-30 DIAGNOSIS — R279 Unspecified lack of coordination: Secondary | ICD-10-CM | POA: Diagnosis not present

## 2020-12-30 DIAGNOSIS — M109 Gout, unspecified: Secondary | ICD-10-CM | POA: Diagnosis not present

## 2020-12-30 DIAGNOSIS — Z79899 Other long term (current) drug therapy: Secondary | ICD-10-CM | POA: Diagnosis not present

## 2020-12-31 DIAGNOSIS — E1169 Type 2 diabetes mellitus with other specified complication: Secondary | ICD-10-CM | POA: Diagnosis not present

## 2020-12-31 DIAGNOSIS — I10 Essential (primary) hypertension: Secondary | ICD-10-CM | POA: Diagnosis not present

## 2020-12-31 DIAGNOSIS — R262 Difficulty in walking, not elsewhere classified: Secondary | ICD-10-CM | POA: Diagnosis not present

## 2020-12-31 DIAGNOSIS — F17200 Nicotine dependence, unspecified, uncomplicated: Secondary | ICD-10-CM | POA: Diagnosis not present

## 2020-12-31 DIAGNOSIS — M109 Gout, unspecified: Secondary | ICD-10-CM | POA: Diagnosis not present

## 2020-12-31 DIAGNOSIS — I252 Old myocardial infarction: Secondary | ICD-10-CM | POA: Diagnosis not present

## 2020-12-31 DIAGNOSIS — I5041 Acute combined systolic (congestive) and diastolic (congestive) heart failure: Secondary | ICD-10-CM | POA: Diagnosis not present

## 2021-01-03 ENCOUNTER — Telehealth: Payer: Self-pay | Admitting: Legal Medicine

## 2021-01-03 NOTE — Chronic Care Management (AMB) (Signed)
  Chronic Care Management   Outreach Note  01/03/2021 Name: THELBERT GARTIN MRN: [id number] DOB: 06-01-1961  Referred by: [name], MD Reason for referral : No chief complaint on file.   An unsuccessful telephone outreach was attempted today. The patient was referred to the pharmacist for assistance with care management and care coordination.   Follow Up Plan:   Tatjana Dellinger Upstream Scheduler

## 2021-01-04 ENCOUNTER — Other Ambulatory Visit: Payer: Self-pay | Admitting: Legal Medicine

## 2021-01-07 DIAGNOSIS — R42 Dizziness and giddiness: Secondary | ICD-10-CM | POA: Diagnosis not present

## 2021-01-07 DIAGNOSIS — G8929 Other chronic pain: Secondary | ICD-10-CM | POA: Diagnosis not present

## 2021-01-07 DIAGNOSIS — T8484XA Pain due to internal orthopedic prosthetic devices, implants and grafts, initial encounter: Secondary | ICD-10-CM | POA: Diagnosis not present

## 2021-01-07 DIAGNOSIS — M25562 Pain in left knee: Secondary | ICD-10-CM | POA: Diagnosis not present

## 2021-01-07 DIAGNOSIS — Z96652 Presence of left artificial knee joint: Secondary | ICD-10-CM | POA: Diagnosis not present

## 2021-01-08 ENCOUNTER — Other Ambulatory Visit: Payer: Self-pay

## 2021-01-09 DIAGNOSIS — F17200 Nicotine dependence, unspecified, uncomplicated: Secondary | ICD-10-CM | POA: Diagnosis not present

## 2021-01-09 DIAGNOSIS — I10 Essential (primary) hypertension: Secondary | ICD-10-CM | POA: Diagnosis not present

## 2021-01-09 DIAGNOSIS — N189 Chronic kidney disease, unspecified: Secondary | ICD-10-CM | POA: Diagnosis not present

## 2021-01-09 DIAGNOSIS — I5041 Acute combined systolic (congestive) and diastolic (congestive) heart failure: Secondary | ICD-10-CM | POA: Diagnosis not present

## 2021-01-09 DIAGNOSIS — E1169 Type 2 diabetes mellitus with other specified complication: Secondary | ICD-10-CM | POA: Diagnosis not present

## 2021-01-09 DIAGNOSIS — R262 Difficulty in walking, not elsewhere classified: Secondary | ICD-10-CM | POA: Diagnosis not present

## 2021-01-09 MED ORDER — RANOLAZINE ER 500 MG PO TB12: 500.0000 mg | ORAL_TABLET | Freq: Two times a day (BID) | ORAL | 0 refills | Status: DC

## 2021-01-11 DIAGNOSIS — E785 Hyperlipidemia, unspecified: Secondary | ICD-10-CM | POA: Diagnosis not present

## 2021-01-11 DIAGNOSIS — Z7984 Long term (current) use of oral hypoglycemic drugs: Secondary | ICD-10-CM | POA: Diagnosis not present

## 2021-01-11 DIAGNOSIS — Z981 Arthrodesis status: Secondary | ICD-10-CM | POA: Diagnosis not present

## 2021-01-11 DIAGNOSIS — R55 Syncope and collapse: Secondary | ICD-10-CM | POA: Diagnosis not present

## 2021-01-11 DIAGNOSIS — Z7982 Long term (current) use of aspirin: Secondary | ICD-10-CM | POA: Diagnosis not present

## 2021-01-11 DIAGNOSIS — I130 Hypertensive heart and chronic kidney disease with heart failure and stage 1 through stage 4 chronic kidney disease, or unspecified chronic kidney disease: Secondary | ICD-10-CM | POA: Diagnosis not present

## 2021-01-11 DIAGNOSIS — M7989 Other specified soft tissue disorders: Secondary | ICD-10-CM | POA: Diagnosis not present

## 2021-01-11 DIAGNOSIS — M2578 Osteophyte, vertebrae: Secondary | ICD-10-CM | POA: Diagnosis not present

## 2021-01-11 DIAGNOSIS — I2510 Atherosclerotic heart disease of native coronary artery without angina pectoris: Secondary | ICD-10-CM | POA: Diagnosis not present

## 2021-01-11 DIAGNOSIS — R52 Pain, unspecified: Secondary | ICD-10-CM | POA: Diagnosis not present

## 2021-01-11 DIAGNOSIS — R519 Headache, unspecified: Secondary | ICD-10-CM | POA: Diagnosis not present

## 2021-01-11 DIAGNOSIS — I5042 Chronic combined systolic (congestive) and diastolic (congestive) heart failure: Secondary | ICD-10-CM | POA: Diagnosis not present

## 2021-01-11 DIAGNOSIS — Z794 Long term (current) use of insulin: Secondary | ICD-10-CM | POA: Diagnosis not present

## 2021-01-11 DIAGNOSIS — F17210 Nicotine dependence, cigarettes, uncomplicated: Secondary | ICD-10-CM | POA: Diagnosis not present

## 2021-01-11 DIAGNOSIS — M47812 Spondylosis without myelopathy or radiculopathy, cervical region: Secondary | ICD-10-CM | POA: Diagnosis not present

## 2021-01-11 DIAGNOSIS — N189 Chronic kidney disease, unspecified: Secondary | ICD-10-CM | POA: Diagnosis not present

## 2021-01-11 DIAGNOSIS — I252 Old myocardial infarction: Secondary | ICD-10-CM | POA: Diagnosis not present

## 2021-01-11 DIAGNOSIS — I739 Peripheral vascular disease, unspecified: Secondary | ICD-10-CM | POA: Diagnosis not present

## 2021-01-11 DIAGNOSIS — R569 Unspecified convulsions: Secondary | ICD-10-CM | POA: Diagnosis not present

## 2021-01-11 DIAGNOSIS — J449 Chronic obstructive pulmonary disease, unspecified: Secondary | ICD-10-CM | POA: Diagnosis not present

## 2021-01-11 DIAGNOSIS — R112 Nausea with vomiting, unspecified: Secondary | ICD-10-CM | POA: Diagnosis not present

## 2021-01-11 DIAGNOSIS — R42 Dizziness and giddiness: Secondary | ICD-10-CM | POA: Diagnosis not present

## 2021-01-11 DIAGNOSIS — Z79899 Other long term (current) drug therapy: Secondary | ICD-10-CM | POA: Diagnosis not present

## 2021-01-11 DIAGNOSIS — R079 Chest pain, unspecified: Secondary | ICD-10-CM | POA: Diagnosis not present

## 2021-01-11 DIAGNOSIS — Z791 Long term (current) use of non-steroidal anti-inflammatories (NSAID): Secondary | ICD-10-CM | POA: Diagnosis not present

## 2021-01-11 DIAGNOSIS — E1122 Type 2 diabetes mellitus with diabetic chronic kidney disease: Secondary | ICD-10-CM | POA: Diagnosis not present

## 2021-01-11 DIAGNOSIS — R197 Diarrhea, unspecified: Secondary | ICD-10-CM | POA: Diagnosis not present

## 2021-01-11 DIAGNOSIS — I6529 Occlusion and stenosis of unspecified carotid artery: Secondary | ICD-10-CM | POA: Diagnosis not present

## 2021-01-11 DIAGNOSIS — R5383 Other fatigue: Secondary | ICD-10-CM | POA: Diagnosis not present

## 2021-01-11 DIAGNOSIS — R404 Transient alteration of awareness: Secondary | ICD-10-CM | POA: Diagnosis not present

## 2021-01-11 DIAGNOSIS — Z20822 Contact with and (suspected) exposure to covid-19: Secondary | ICD-10-CM | POA: Diagnosis not present

## 2021-01-13 ENCOUNTER — Telehealth: Payer: Self-pay | Admitting: Legal Medicine

## 2021-01-13 NOTE — Chronic Care Management (AMB) (Signed)
  Chronic Care Management   Note  01/13/2021 Name: Brent Ortiz MRN: [id number] DOB: [birth year]  Brent Ortiz is a 60 y.o. year old male who is a primary care patient of Brent Anes, MD. I reached out to Brent Ortiz by phone today in response to a referral sent by Brent Ortiz PCP, Brent Anes, MD.   Brent Ortiz was given information about Chronic Care Management services today including:  CCM service includes personalized support from designated clinical staff supervised by his physician, including individualized plan of care and coordination with other care providers 24/7 contact phone numbers for assistance for urgent and routine care needs. Service will only be billed when office clinical staff spend 20 minutes or more in a month to coordinate care. Only one practitioner may furnish and bill the service in a calendar month. The patient may stop CCM services at any time (effective at the end of the month) by phone call to the office staff.   Brent Ortiz  verbally agreed to assistance and services provided by embedded care coordination/care management team today.  Follow up plan:   Brent Ortiz

## 2021-01-15 DIAGNOSIS — R42 Dizziness and giddiness: Secondary | ICD-10-CM | POA: Diagnosis not present

## 2021-01-21 DIAGNOSIS — I5041 Acute combined systolic (congestive) and diastolic (congestive) heart failure: Secondary | ICD-10-CM | POA: Diagnosis not present

## 2021-01-21 DIAGNOSIS — M109 Gout, unspecified: Secondary | ICD-10-CM | POA: Diagnosis not present

## 2021-01-22 DIAGNOSIS — G4733 Obstructive sleep apnea (adult) (pediatric): Secondary | ICD-10-CM | POA: Diagnosis not present

## 2021-01-24 ENCOUNTER — Other Ambulatory Visit: Payer: Self-pay

## 2021-01-24 ENCOUNTER — Encounter: Payer: Self-pay | Admitting: Legal Medicine

## 2021-01-24 ENCOUNTER — Ambulatory Visit (INDEPENDENT_AMBULATORY_CARE_PROVIDER_SITE_OTHER): Payer: Medicare Other | Admitting: Legal Medicine

## 2021-01-24 VITALS — BP 142/90 | HR 78 | Temp 97.4°F | Resp 18 | Ht 68.0 in | Wt 302.0 lb

## 2021-01-24 DIAGNOSIS — E782 Mixed hyperlipidemia: Secondary | ICD-10-CM

## 2021-01-24 DIAGNOSIS — F321 Major depressive disorder, single episode, moderate: Secondary | ICD-10-CM

## 2021-01-24 DIAGNOSIS — N1832 Chronic kidney disease, stage 3b: Secondary | ICD-10-CM | POA: Diagnosis not present

## 2021-01-24 DIAGNOSIS — E1142 Type 2 diabetes mellitus with diabetic polyneuropathy: Secondary | ICD-10-CM | POA: Diagnosis not present

## 2021-01-24 DIAGNOSIS — F17200 Nicotine dependence, unspecified, uncomplicated: Secondary | ICD-10-CM | POA: Diagnosis not present

## 2021-01-24 DIAGNOSIS — I5041 Acute combined systolic (congestive) and diastolic (congestive) heart failure: Secondary | ICD-10-CM | POA: Diagnosis not present

## 2021-01-24 DIAGNOSIS — Z6841 Body Mass Index (BMI) 40.0 and over, adult: Secondary | ICD-10-CM

## 2021-01-24 DIAGNOSIS — R059 Cough, unspecified: Secondary | ICD-10-CM

## 2021-01-24 DIAGNOSIS — G894 Chronic pain syndrome: Secondary | ICD-10-CM | POA: Diagnosis not present

## 2021-01-24 DIAGNOSIS — I10 Essential (primary) hypertension: Secondary | ICD-10-CM | POA: Diagnosis not present

## 2021-01-24 DIAGNOSIS — I25118 Atherosclerotic heart disease of native coronary artery with other forms of angina pectoris: Secondary | ICD-10-CM

## 2021-01-24 LAB — POC COVID19 BINAXNOW: SARS Coronavirus 2 Ag: NEGATIVE

## 2021-01-24 NOTE — Progress Notes (Signed)
Established Patient Office Visit  Subjective:  Patient ID: Brent Ortiz, male    DOB: August 31, 1960  Age: 60 y.o. MRN: [id number]  CC:  Chief Complaint  Patient presents with   Hypertension   Hyperlipidemia   COPD    HPI Brent Ortiz presents for chronic visit  Patient presents for follow up of hypertension.  Patient tolerating amlodipine, metoprolol and valsartan well with side effects.  Patient was diagnosed with hypertension 2010 so has been treated for hypertension for 10 years.Patient is working on maintaining diet and exercise regimen and follows up as directed. Complication include cad.   Patient present with type 2 diabetes.  Specifically, this is type 2, insulin requiring diabetes, complicated by polyneuropathy.  Compliance with treatment has been good; patient take medicines as directed, maintains diet and exercise regimen, follows up as directed, and is keeping glucose diary.  Date of  diagnosis 2010.  Depression screen has been performed.Tobacco screen smoker. Current medicines for diabetes valsartan.  Patient is on valsartan for renal protection and crestor for cholesterol control.  Patient performs foot exams daily and last ophthalmologic exam was yes.   Patient presents with HFrEF  that is stable. Diagnosis made 2022.  The course of the disease is stable.  Current medicines include entreato, ranexa, spirolactone. Patient follows a low cholesterol diet and maintains a weight diary.  Patient is on low salt, low cholesterol diet and avoids alcohol.  Patient denies adverse effects of medicines. Patient is monitoring weight and has lost weigh of weight.  Patient is having some pedal edema, no PND and no PND.  Patient is continuing to see cardiology.   Past Medical History:  Diagnosis Date   Acute combined systolic and diastolic CHF, NYHA class 2 (Brent Ortiz) 01/22/2020   Alcohol abuse    Atherosclerosis of coronary artery of native heart with stable angina pectoris, unspecified  vessel or lesion type (Brent Ortiz) 01/23/2016   Overview:  Completely occluded circumflex artery proximal to obtuse marginal 1 basin cardiac catheter from 2017   Chronic kidney disease    Chronic pain 10/19/2019   Sees pain clinic   COPD (chronic obstructive pulmonary disease) ([place])    Coronary artery disease involving native heart 02/08/2020   Coronary atherosclerosis of native coronary artery 01/23/2016   Overview:  Completely occluded circumflex artery proximal to obtuse marginal 1 basin cardiac catheter from 2017   Depression, major, single episode, moderate (Brent Ortiz) 07/11/2020   Diabetic polyneuropathy (Brent Ortiz) 12/06/2019   ED (erectile dysfunction) 12/06/2019   Essential hypertension 01/23/2016   Essential thrombocythemia ([place]) 09/29/2011   GERD without esophagitis    HFrEF (heart failure with reduced ejection fraction) (Brent Ortiz) 02/08/2020   Knee pain, left 02/12/2012   Formatting of this note might be different from the original. Left   Leukocytosis 09/04/2011   Mixed hyperlipidemia 06/18/2017   Morbid obesity ([place]) 11/21/2020   Morbid obesity with BMI of 40.0-44.9, adult ([place]) 01/13/2013   Myocardial infarction (Brent Ortiz)    04-13-2011   Obstructive chronic bronchitis with exacerbation (Brent Ortiz) 10/03/2019   Opiate abuse, continuous ([facility])    Osteoarthritis    Pain in knee joint 02/12/2012   Formatting of this note might be different from the original. Left   Pneumonia    Pneumonia due to infectious organism 09/04/2011   Polysubstance abuse (Brent Ortiz)    Poor dentition    Prostatitis 12/04/2020   Thrombocytosis 09/29/2011   Tobacco dependence 01/23/2016   Type 2 diabetes mellitus with diabetic polyneuropathy (Brent Ortiz)  Past Surgical History:  Procedure Laterality Date   HERNIA REPAIR  2009   I & D KNEE WITH POLY EXCHANGE  09/03/2011   Procedure: IRRIGATION AND DEBRIDEMENT KNEE WITH POLY EXCHANGE;  Surgeon: Mauri Pole, MD;  Location: [facility];  Service: Orthopedics;  Laterality: Left;   TOOTH  EXTRACTION Bilateral 08/06/2017   Procedure: DENTAL RESTORATION/EXTRACTIONS;  Surgeon: Diona Browner, DDS;  Location: [facility];  Service: Oral Surgery;  Laterality: Bilateral;   TOTAL KNEE ARTHROPLASTY   right  feb 2012   left march 2012 r   TOTAL KNEE REVISION  06/23/2011   Procedure: TOTAL KNEE REVISION;  Surgeon: Mauri Pole;  Location: [facility];  Service: Orthopedics;  Laterality: Left;  femomal nerve block done in holding area without incident    Family History  Problem Relation Age of Onset   Colon cancer Other    Colon cancer Mother     Social History   Socioeconomic History   Marital status: Widowed    Spouse name: Not on file   Number of children: 5   Years of education: Not on file   Highest education level: Not on file  Occupational History   Occupation: Disabled  Tobacco Use   Smoking status: Every Day    Packs/day: 1.00    Years: 50.00    Pack years: 50.00    Types: Cigarettes   Smokeless tobacco: Never  Vaping Use   Vaping Use: Former  Substance and Sexual Activity   Alcohol use: No    Comment: heavy drinker in past    Drug use: No   Sexual activity: Not Currently  Other Topics Concern   Not on file  Social History Narrative   Not on file   Social Determinants of Health   Financial Resource Strain: Low Risk    Difficulty of Paying Living Expenses: Not very hard  Food Insecurity: No Food Insecurity   Worried About Charity fundraiser in the Last Year: Never true   [place] in the Last Year: Never true  Transportation Needs: No Transportation Needs   Lack of Transportation (Medical): No   Lack of Transportation (Non-Medical): No  Physical Activity: Inactive   Days of Exercise per Week: 0 days   Minutes of Exercise per Session: 0 min  Stress: No Stress Concern Present   Feeling of Stress : Not at all  Social Connections: Moderately Integrated   Frequency of Communication with Friends and Family: Twice a week   Frequency of Social Gatherings  with Friends and Family: Once a week   Attends Religious Services: More than 4 times per year   Active Member of [employer] or Organizations: Yes   Attends Archivist Meetings: More than 4 times per year   Marital Status: Widowed  Human resources officer Violence: Not At Risk   Fear of Current or Ex-Partner: No   Emotionally Abused: No   Physically Abused: No   Sexually Abused: No    Outpatient Medications Prior to Visit  Medication Sig Dispense Refill   acetaminophen (TYLENOL) 325 MG tablet Take 650 mg by mouth every 4 (four) hours as needed for mild pain.     albuterol (VENTOLIN HFA) 108 (90 Base) MCG/ACT inhaler INHALE 2 PUFFS INTO THE LUNGS EVERY 4 HOURS AS NEEDED FOR WHEEZING OR SHORTNESS OF BREATH. 8.5 g 6   amLODipine (NORVASC) 5 MG tablet Take 5 mg by mouth daily.     atorvastatin (LIPITOR) 80 MG tablet Take 80  mg by mouth daily.     colchicine-probenecid 0.5-500 MG tablet Take 1 tablet by mouth 2 (two) times daily as needed.     diclofenac Sodium (VOLTAREN) 1 % GEL Apply 2 g topically 4 (four) times daily.     ENTRESTO 24-26 MG Take by mouth.     ezetimibe (ZETIA) 10 MG tablet Take 10 mg by mouth daily.     gabapentin (NEURONTIN) 300 MG capsule Take 300 mg by mouth 3 (three) times daily.     Glucagon, rDNA, (GLUCAGON EMERGENCY) 1 MG KIT Inject 1 mg into the muscle as needed (BG less than 70).     GOODSENSE ASPIRIN LOW DOSE 81 MG EC tablet TAKE 1 TABLET (81MG) BY MOUTH DAILY. SWALLOW WHOLE.(YELLOW ROUND TAB WITH HEART) 90 tablet 2   indomethacin (INDOCIN) 25 MG capsule Take 1 capsule (25 mg total) by mouth 3 (three) times daily as needed. For gout flair only 93 capsule 1   insulin glargine (LANTUS) 100 UNIT/ML injection Inject 20 Units into the skin daily.     isosorbide mononitrate (IMDUR) 60 MG 24 hr tablet Take 60 mg by mouth daily.     meclizine (ANTIVERT) 12.5 MG tablet Take 12.5 mg by mouth 3 (three) times daily as needed for dizziness.     meloxicam (MOBIC) 15 MG tablet Take  by mouth.     metFORMIN (GLUCOPHAGE) 500 MG tablet Take 500 mg by mouth 2 (two) times daily with a meal.     metoprolol succinate (TOPROL-XL) 25 MG 24 hr tablet TAKE 1 TABLET (25 MG TOTAL) BY MOUTH DAILY.(OVAL WHITE TAB WITH 564) 31 tablet 3   nicotine (NICODERM CQ - DOSED IN MG/24 HOURS) 14 mg/24hr patch Place 14 mg onto the skin daily.     nitroGLYCERIN (NITROSTAT) 0.4 MG SL tablet Place 1 tablet (0.4 mg total) under the tongue every 5 (five) minutes as needed. For chest pain 50 tablet 3   pantoprazole (PROTONIX) 40 MG tablet TAKE 1 TABLET(40MG) BY MOUTH DAILY.(OVALYELLOW TAB WITH H126) 90 tablet 2   polyethylene glycol (MIRALAX / GLYCOLAX) 17 g packet Take 17 g by mouth daily.     potassium chloride SA (KLOR-CON) 20 MEQ tablet Take by mouth.     ranolazine (RANEXA) 500 MG 12 hr tablet Take 1 tablet (500 mg total) by mouth 2 (two) times daily. 180 tablet 0   rosuvastatin (CRESTOR) 20 MG tablet Take by mouth.     sertraline (ZOLOFT) 25 MG tablet TAKE 1 TABLET(25 MG) BY MOUTH DAILY 90 tablet 2   spironolactone (ALDACTONE) 25 MG tablet Take 12.5 mg by mouth daily.     SYMBICORT 160-4.5 MCG/ACT inhaler Inhale 2 puffs into the lungs 2 (two) times daily. 30.8 g 2   tamsulosin (FLOMAX) 0.4 MG CAPS capsule TAKE 1 CAPSULE (.4MG TOTAL) BY MOUTH DAILY.(GREEN AND TAN CAP WITH TAM 0.4MG) 90 capsule 2   traZODone (DESYREL) 150 MG tablet Take 1 tablet (150 mg total) by mouth at bedtime. 90 tablet 2   No facility-administered medications prior to visit.    Allergies  Allergen Reactions   Motrin [Ibuprofen] Other (See Comments)    Reaction: ulcers   Other Nausea And Vomiting    Patient has ulcers    ROS Review of Systems  Constitutional:  Negative for chills, fatigue and fever.  HENT:  Negative for congestion, ear pain and sore throat.   Respiratory:  Negative for cough and shortness of breath.   Cardiovascular:  Negative for chest pain, palpitations  and leg swelling.  Gastrointestinal:  Negative  for abdominal pain, constipation, diarrhea, nausea and vomiting.  Endocrine: Negative for polydipsia, polyphagia and polyuria.  Genitourinary:  Negative for dysuria and frequency.  Musculoskeletal:  Negative for arthralgias and myalgias.  Neurological:  Negative for dizziness and headaches.  Psychiatric/Behavioral:  Negative for dysphoric mood.        No dysphoria     Objective:    Physical Exam Vitals reviewed.  Constitutional:      General: He is not in acute distress.    Appearance: Normal appearance. He is obese.     Comments: Using rollator  HENT:     Head: Normocephalic and atraumatic.     Right Ear: Tympanic membrane, ear canal and external ear normal.     Left Ear: Tympanic membrane, ear canal and external ear normal.     Mouth/Throat:     Mouth: Mucous membranes are dry.     Pharynx: Oropharynx is clear.  Eyes:     Extraocular Movements: Extraocular movements intact.     Conjunctiva/sclera: Conjunctivae normal.     Pupils: Pupils are equal, round, and reactive to light.  Cardiovascular:     Rate and Rhythm: Normal rate and regular rhythm.     Pulses: Normal pulses.     Heart sounds: Normal heart sounds. No murmur heard.   No gallop.  Pulmonary:     Effort: Pulmonary effort is normal. No respiratory distress.     Breath sounds: Normal breath sounds. No wheezing.  Abdominal:     General: Abdomen is flat. Bowel sounds are normal. There is no distension.     Palpations: Abdomen is soft.     Tenderness: There is no abdominal tenderness.  Musculoskeletal:        General: Normal range of motion.     Cervical back: Normal range of motion.     Comments: Chronic knee pain, needs revision  Skin:    General: Skin is warm.     Capillary Refill: Capillary refill takes less than 2 seconds.  Neurological:     General: No focal deficit present.     Mental Status: He is alert and oriented to person, place, and time. Mental status is at baseline.     Sensory: Sensory deficit  present.     Motor: No weakness.  Psychiatric:        Mood and Affect: Mood normal.        Thought Content: Thought content normal.   Depression screen [facility] 2/9 01/24/2021 09/27/2020 04/24/2020 12/06/2019 09/29/2011  Decreased Interest 1 0 3 1 0  Down, Depressed, Hopeless 1 0 3 1 0  PHQ - 2 Score 2 0 6 2 0  Altered sleeping 2 0 2 1 -  Tired, decreased energy 2 0 3 1 -  Change in appetite 1 0 2 1 -  Feeling bad or failure about yourself  1 0 3 1 -  Trouble concentrating 1 0 3 2 -  Moving slowly or fidgety/restless 1 0 2 2 -  Suicidal thoughts 0 0 0 2 -  PHQ-9 Score 10 0 21 12 -  Difficult doing work/chores Somewhat difficult Not difficult at all Very difficult Very difficult -    BP (!) 142/90   Pulse 78   Temp (!) 97.4 F (36.3 C)   Resp 16   Ht _0  (1.727 m)   Wt (!) 302 lb (137 kg)   SpO2 97%   BMI 45.92 kg/m  Wt Readings  from Last 3 Encounters:  01/24/21 (!) 302 lb (137 kg)  12/27/20 (!) 312 lb (141.5 kg)  12/04/20 (!) 302 lb (137 kg)     Health Maintenance Due  Topic Date Due   PNEUMOCOCCAL POLYSACCHARIDE VACCINE AGE 110-64 HIGH RISK  Never done   HIV Screening  Never done   Hepatitis C Screening  Never done   TETANUS/TDAP  Never done   Zoster Vaccines- Shingrix (2 of 2) 07/02/2019   Pneumococcal Vaccine 65-70 Years old (2 - PCV) 05/06/2020   COVID-19 Vaccine (4 - Booster for Pfizer series) 10/11/2020    There are no preventive care reminders to display for this patient.  No results found for: TSH Lab Results  Component Value Date   WBC 9.0 09/27/2020   HGB 15.2 09/27/2020   HCT 45.5 09/27/2020   MCV 89 09/27/2020   PLT 373 09/27/2020   Lab Results  Component Value Date   NA 135 12/27/2020   K 4.5 12/27/2020   CO2 22 12/27/2020   GLUCOSE 152 (H) 12/27/2020   BUN 18 12/27/2020   CREATININE 1.23 12/27/2020   BILITOT 0.3 10/08/2020   ALKPHOS 59 10/08/2020   AST 13 10/08/2020   ALT 12 10/08/2020   PROT 6.5 10/08/2020   ALBUMIN 4.1 10/08/2020    CALCIUM 9.4 12/27/2020   ANIONGAP 11 08/06/2017   EGFR 66 12/27/2020   Lab Results  Component Value Date   CHOL 236 (H) 09/27/2020   Lab Results  Component Value Date   HDL 55 09/27/2020   Lab Results  Component Value Date   LDLCALC 165 (H) 09/27/2020   Lab Results  Component Value Date   TRIG 89 09/27/2020   Lab Results  Component Value Date   CHOLHDL 4.3 09/27/2020   Lab Results  Component Value Date   HGBA1C 6.2 (H) 09/27/2020      Assessment & Plan:   Problem List Items Addressed This Visit       Cardiovascular and Mediastinum   Atherosclerosis of coronary artery of native heart with stable angina pectoris, unspecified vessel or lesion type ([facility]CC)   Relevant Medications   meloxicam (MOBIC) 15 MG tablet   rosuvastatin (CRESTOR) 20 MG tablet   ENTRESTO 24-26 MG   spironolactone (ALDACTONE) 25 MG tablet   Essential hypertension   Relevant Medications   rosuvastatin (CRESTOR) 20 MG tablet   ENTRESTO 24-26 MG   spironolactone (ALDACTONE) 25 MG tablet     Endocrine   Diabetic polyneuropathy (HCC)   Relevant Medications   rosuvastatin (CRESTOR) 20 MG tablet     Other   Mixed hyperlipidemia   Relevant Medications   rosuvastatin (CRESTOR) 20 MG tablet   ENTRESTO 24-26 MG   spironolactone (ALDACTONE) 25 MG tablet   Chronic pain   Relevant Medications   meloxicam (MOBIC) 15 MG tablet   Tobacco use disorder   Depression, major, single episode, moderate (HCC)   Other Visit Diagnoses     Cough    -  Primary       No orders of the defined types were placed in this encounter.   Follow-up: No follow-ups on file.    Reinaldo Meeker, MD

## 2021-01-25 LAB — HEMOGLOBIN A1C
Est. average glucose Bld gHb Est-mCnc: 171 mg/dL
Hgb A1c MFr Bld: 7.6 % — ABNORMAL HIGH (ref 4.8–5.6)

## 2021-01-25 LAB — COMPREHENSIVE METABOLIC PANEL
ALT: 14 IU/L (ref 0–44)
AST: 11 IU/L (ref 0–40)
Albumin/Globulin Ratio: 2.2 (ref 1.2–2.2)
Albumin: 4.3 g/dL (ref 3.8–4.8)
Alkaline Phosphatase: 76 IU/L (ref 44–121)
BUN/Creatinine Ratio: 14 (ref 10–24)
BUN: 16 mg/dL (ref 8–27)
Bilirubin Total: 1 mg/dL (ref 0.0–1.2)
CO2: 20 mmol/L (ref 20–29)
Calcium: 9.1 mg/dL (ref 8.6–10.2)
Chloride: 101 mmol/L (ref 96–106)
Creatinine, Ser: 1.16 mg/dL (ref 0.76–1.27)
Globulin, Total: 2 g/dL (ref 1.5–4.5)
Glucose: 177 mg/dL — ABNORMAL HIGH (ref 65–99)
Potassium: 5 mmol/L (ref 3.5–5.2)
Sodium: 136 mmol/L (ref 134–144)
Total Protein: 6.3 g/dL (ref 6.0–8.5)
eGFR: 71 mL/min/{1.73_m2} (ref >59)

## 2021-01-25 LAB — LIPID PANEL
Chol/HDL Ratio: 3.3 ratio (ref 0.0–5.0)
Cholesterol, Total: 217 mg/dL — ABNORMAL HIGH (ref 100–199)
HDL: 65 mg/dL (ref >39)
LDL Chol Calc (NIH): 129 mg/dL — ABNORMAL HIGH (ref 0–99)
Triglycerides: 132 mg/dL (ref 0–149)
VLDL Cholesterol Cal: 23 mg/dL (ref 5–40)

## 2021-01-25 LAB — CBC WITH DIFFERENTIAL/PLATELET
Basophils Absolute: 0 10*3/uL (ref 0.0–0.2)
Basos: 0 %
EOS (ABSOLUTE): 0.1 10*3/uL (ref 0.0–0.4)
Eos: 1 %
Hematocrit: 44 % (ref 37.5–51.0)
Hemoglobin: 15.3 g/dL (ref 13.0–17.7)
Immature Grans (Abs): 0.1 10*3/uL (ref 0.0–0.1)
Immature Granulocytes: 1 %
Lymphocytes Absolute: 1.9 10*3/uL (ref 0.7–3.1)
Lymphs: 23 %
MCH: 31 pg (ref 26.6–33.0)
MCHC: 34.8 g/dL (ref 31.5–35.7)
MCV: 89 fL (ref 79–97)
Monocytes Absolute: 0.5 10*3/uL (ref 0.1–0.9)
Monocytes: 6 %
Neutrophils Absolute: 5.8 10*3/uL (ref 1.4–7.0)
Neutrophils: 69 %
Platelets: 253 10*3/uL (ref 150–450)
RBC: 4.93 x10E6/uL (ref 4.14–5.80)
RDW: 13.3 % (ref 11.6–15.4)
WBC: 8.4 10*3/uL (ref 3.4–10.8)

## 2021-01-25 LAB — CARDIOVASCULAR RISK ASSESSMENT

## 2021-01-25 LAB — TSH: TSH: 0.719 u[IU]/mL (ref 0.450–4.500)

## 2021-01-25 LAB — PSA: Prostate Specific Ag, Serum: 2.3 ng/mL (ref 0.0–4.0)

## 2021-01-26 NOTE — Progress Notes (Signed)
Glucose 177, kidney and liver tests normal, A1c 7.6, LDL cholesterol 129 high, need to take crestor every day, CBC normal, PSA 2.3 normal, TSH 0.719 normal lp

## 2021-01-28 ENCOUNTER — Other Ambulatory Visit: Payer: Self-pay

## 2021-01-28 ENCOUNTER — Encounter: Payer: Self-pay | Admitting: Sports Medicine

## 2021-01-28 ENCOUNTER — Ambulatory Visit (INDEPENDENT_AMBULATORY_CARE_PROVIDER_SITE_OTHER): Payer: Medicare Other | Admitting: Sports Medicine

## 2021-01-28 DIAGNOSIS — E1142 Type 2 diabetes mellitus with diabetic polyneuropathy: Secondary | ICD-10-CM

## 2021-01-28 DIAGNOSIS — B351 Tinea unguium: Secondary | ICD-10-CM

## 2021-01-28 DIAGNOSIS — M79609 Pain in unspecified limb: Secondary | ICD-10-CM | POA: Diagnosis not present

## 2021-01-28 DIAGNOSIS — L853 Xerosis cutis: Secondary | ICD-10-CM

## 2021-01-28 NOTE — Progress Notes (Signed)
Subjective: Brent Ortiz is a 60 y.o. male patient with history of diabetes who presents to office today complaining of long,mildly painful nails  while ambulating in shoes; unable to trim. Patient states that the glucose reading this morning was not recorded, Last PCP visit was last week. Patient denies any new changes in medication or new problems.   Patient Active Problem List   Diagnosis Date Noted   Coronary artery disease involving native coronary artery of native heart without angina pectoris 12/27/2020   Stage 3b chronic kidney disease ([place]) 12/27/2020   Prostatitis 12/04/2020   Morbid obesity ([place]) 11/21/2020   Depression, major, single episode, moderate ([place]) 07/11/2020   Coronary artery disease involving native heart 02/08/2020   HFrEF (heart failure with reduced ejection fraction) ([place]) 02/08/2020   Acute combined systolic and diastolic CHF, NYHA class 2 ([place]) 01/22/2020   ED (erectile dysfunction) 12/06/2019   Diabetic polyneuropathy ([place]) 12/06/2019   Chronic pain 10/19/2019   Obstructive chronic bronchitis with exacerbation ([place]) 10/03/2019   Mixed hyperlipidemia 06/18/2017   Alcohol abuse    Opiate abuse, continuous (HCC)    Polysubstance abuse (HCC)    Atherosclerosis of coronary artery of native heart with stable angina pectoris, unspecified vessel or lesion type ([place]) 01/23/2016   Essential hypertension 01/23/2016   Tobacco dependence 01/23/2016   Tobacco use disorder 01/23/2016   BMI 45.0-49.9, adult ([place]) 01/13/2013   Knee pain, left 02/12/2012   Essential thrombocythemia ([place]) 09/29/2011   Pneumonia due to infectious organism 09/04/2011   Current Outpatient Medications on File Prior to Visit  Medication Sig Dispense Refill   acetaminophen (TYLENOL) 325 MG tablet Take 650 mg by mouth every 4 (four) hours as needed for mild pain.     albuterol (VENTOLIN HFA) 108 (90 Base) MCG/ACT inhaler INHALE 2 PUFFS INTO THE LUNGS EVERY 4 HOURS AS NEEDED FOR WHEEZING OR  SHORTNESS OF BREATH. 8.5 g 6   amLODipine (NORVASC) 5 MG tablet Take 5 mg by mouth daily.     colchicine-probenecid 0.5-500 MG tablet Take 1 tablet by mouth 2 (two) times daily as needed.     diclofenac Sodium (VOLTAREN) 1 % GEL Apply 2 g topically 4 (four) times daily.     ENTRESTO 24-26 MG Take by mouth.     ezetimibe (ZETIA) 10 MG tablet Take 10 mg by mouth daily.     gabapentin (NEURONTIN) 300 MG capsule Take 300 mg by mouth 3 (three) times daily.     Glucagon, rDNA, (GLUCAGON EMERGENCY) 1 MG KIT Inject 1 mg into the muscle as needed (BG less than 70).     GOODSENSE ASPIRIN LOW DOSE 81 MG EC tablet TAKE 1 TABLET (81MG) BY MOUTH DAILY. SWALLOW WHOLE.(YELLOW ROUND TAB WITH HEART) 90 tablet 2   insulin glargine (LANTUS) 100 UNIT/ML injection Inject 20 Units into the skin daily.     isosorbide mononitrate (IMDUR) 60 MG 24 hr tablet Take 60 mg by mouth daily.     meclizine (ANTIVERT) 12.5 MG tablet Take 12.5 mg by mouth 3 (three) times daily as needed for dizziness.     meloxicam (MOBIC) 15 MG tablet Take by mouth.     metFORMIN (GLUCOPHAGE) 500 MG tablet Take 500 mg by mouth 2 (two) times daily with a meal.     metoprolol succinate (TOPROL-XL) 25 MG 24 hr tablet TAKE 1 TABLET (25 MG TOTAL) BY MOUTH DAILY.(OVAL WHITE TAB WITH 564) 31 tablet 3   nicotine (NICODERM CQ - DOSED IN MG/24 HOURS) 14 mg/24hr  patch Place 14 mg onto the skin daily.     nitroGLYCERIN (NITROSTAT) 0.4 MG SL tablet Place 1 tablet (0.4 mg total) under the tongue every 5 (five) minutes as needed. For chest pain 50 tablet 3   pantoprazole (PROTONIX) 40 MG tablet TAKE 1 TABLET(40MG) BY MOUTH DAILY.(OVALYELLOW TAB WITH H126) 90 tablet 2   polyethylene glycol (MIRALAX / GLYCOLAX) 17 g packet Take 17 g by mouth daily.     potassium chloride SA (KLOR-CON) 20 MEQ tablet Take by mouth.     ranolazine (RANEXA) 500 MG 12 hr tablet Take 1 tablet (500 mg total) by mouth 2 (two) times daily. 180 tablet 0   rosuvastatin (CRESTOR) 20 MG tablet  Take by mouth.     sertraline (ZOLOFT) 25 MG tablet TAKE 1 TABLET(25 MG) BY MOUTH DAILY 90 tablet 2   spironolactone (ALDACTONE) 25 MG tablet Take 12.5 mg by mouth daily.     SYMBICORT 160-4.5 MCG/ACT inhaler Inhale 2 puffs into the lungs 2 (two) times daily. 30.8 g 2   tamsulosin (FLOMAX) 0.4 MG CAPS capsule TAKE 1 CAPSULE (.4MG TOTAL) BY MOUTH DAILY.(GREEN AND TAN CAP WITH TAM 0.4MG) 90 capsule 2   traZODone (DESYREL) 150 MG tablet Take 1 tablet (150 mg total) by mouth at bedtime. 90 tablet 2   No current facility-administered medications on file prior to visit.   Allergies  Allergen Reactions   Motrin [Ibuprofen] Other (See Comments)    Reaction: ulcers   Other Nausea And Vomiting    Patient has ulcers    Recent Results (from the past 2160 hour(s))  HM DIABETES EYE EXAM     Status: None   Collection Time: 11/12/20 12:00 AM  Result Value Ref Range   HM Diabetic Eye Exam No Retinopathy No Retinopathy  POCT URINALYSIS DIP (CLINITEK)     Status: Abnormal   Collection Time: 12/04/20  8:52 AM  Result Value Ref Range   Color, UA orange (A) yellow   Clarity, UA clear clear   Glucose, UA =250 (A) negative mg/dL   Bilirubin, UA negative negative   Ketones, POC UA negative negative mg/dL   Spec Grav, UA >=1.030 (A) 1.010 - 1.025   Blood, UA negative negative   pH, UA 5.5 5.0 - 8.0   POC PROTEIN,UA trace negative, trace   Urobilinogen, UA 1.0 0.2 or 1.0 E.U./dL   Nitrite, UA Negative Negative   Leukocytes, UA Trace (A) Negative  Urine Culture     Status: None   Collection Time: 12/04/20  8:59 AM   Specimen: Urine   UR  Result Value Ref Range   Urine Culture, Routine Final report    Organism ID, Bacteria No growth   Basic metabolic panel     Status: Abnormal   Collection Time: 12/27/20  2:21 PM  Result Value Ref Range   Glucose 152 (H) 65 - 99 mg/dL   BUN 18 8 - 27 mg/dL   Creatinine, Ser 1.23 0.76 - 1.27 mg/dL   eGFR 66 >59 mL/min/1.73   BUN/Creatinine Ratio 15 10 - 24    Sodium 135 134 - 144 mmol/L   Potassium 4.5 3.5 - 5.2 mmol/L   Chloride 97 96 - 106 mmol/L   CO2 22 20 - 29 mmol/L   Calcium 9.4 8.6 - 10.2 mg/dL  Magnesium     Status: None   Collection Time: 12/27/20  2:21 PM  Result Value Ref Range   Magnesium 1.8 1.6 - 2.3 mg/dL  POC COVID-19  Status: Normal   Collection Time: 01/24/21  9:03 AM  Result Value Ref Range   SARS Coronavirus 2 Ag Negative Negative  Comprehensive metabolic panel     Status: Abnormal   Collection Time: 01/24/21  9:19 AM  Result Value Ref Range   Glucose 177 (H) 65 - 99 mg/dL   BUN 16 8 - 27 mg/dL   Creatinine, Ser 1.16 0.76 - 1.27 mg/dL   eGFR 71 >59 mL/min/1.73   BUN/Creatinine Ratio 14 10 - 24   Sodium 136 134 - 144 mmol/L   Potassium 5.0 3.5 - 5.2 mmol/L   Chloride 101 96 - 106 mmol/L   CO2 20 20 - 29 mmol/L   Calcium 9.1 8.6 - 10.2 mg/dL   Total Protein 6.3 6.0 - 8.5 g/dL   Albumin 4.3 3.8 - 4.8 g/dL   Globulin, Total 2.0 1.5 - 4.5 g/dL   Albumin/Globulin Ratio 2.2 1.2 - 2.2   Bilirubin Total 1.0 0.0 - 1.2 mg/dL   Alkaline Phosphatase 76 44 - 121 IU/L   AST 11 0 - 40 IU/L   ALT 14 0 - 44 IU/L  Hemoglobin A1c     Status: Abnormal   Collection Time: 01/24/21  9:19 AM  Result Value Ref Range   Hgb A1c MFr Bld 7.6 (H) 4.8 - 5.6 %    Comment:          Prediabetes: 5.7 - 6.4          Diabetes: >6.4          Glycemic control for adults with diabetes: <7.0    Est. average glucose Bld gHb Est-mCnc 171 mg/dL  Lipid panel     Status: Abnormal   Collection Time: 01/24/21  9:19 AM  Result Value Ref Range   Cholesterol, Total 217 (H) 100 - 199 mg/dL   Triglycerides 132 0 - 149 mg/dL   HDL 65 >39 mg/dL   VLDL Cholesterol Cal 23 5 - 40 mg/dL   LDL Chol Calc (NIH) 129 (H) 0 - 99 mg/dL   Chol/HDL Ratio 3.3 0.0 - 5.0 ratio    Comment:                                   T. Chol/HDL Ratio                                             Men  Women                               1/2 Avg.Risk  3.4    3.3                                    Avg.Risk  5.0    4.4                                2X Avg.Risk  9.6    7.1                                3X Avg.Risk  23.4   11.0   CBC with Differential/Platelet     Status: None   Collection Time: 01/24/21  9:19 AM  Result Value Ref Range   WBC 8.4 3.4 - 10.8 x10E3/uL   RBC 4.93 4.14 - 5.80 x10E6/uL   Hemoglobin 15.3 13.0 - 17.7 g/dL   Hematocrit 44.0 37.5 - 51.0 %   MCV 89 79 - 97 fL   MCH 31.0 26.6 - 33.0 pg   MCHC 34.8 31.5 - 35.7 g/dL   RDW 13.3 11.6 - 15.4 %   Platelets 253 150 - 450 x10E3/uL   Neutrophils 69 Not Estab. %   Lymphs 23 Not Estab. %   Monocytes 6 Not Estab. %   Eos 1 Not Estab. %   Basos 0 Not Estab. %   Neutrophils Absolute 5.8 1.4 - 7.0 x10E3/uL   Lymphocytes Absolute 1.9 0.7 - 3.1 x10E3/uL   Monocytes Absolute 0.5 0.1 - 0.9 x10E3/uL   EOS (ABSOLUTE) 0.1 0.0 - 0.4 x10E3/uL   Basophils Absolute 0.0 0.0 - 0.2 x10E3/uL   Immature Granulocytes 1 Not Estab. %   Immature Grans (Abs) 0.1 0.0 - 0.1 x10E3/uL  PSA     Status: None   Collection Time: 01/24/21  9:19 AM  Result Value Ref Range   Prostate Specific Ag, Serum 2.3 0.0 - 4.0 ng/mL    Comment: Roche ECLIA methodology. According to the American Urological Association, Serum PSA should decrease and remain at undetectable levels after radical prostatectomy. The AUA defines biochemical recurrence as an initial PSA value 0.2 ng/mL or greater followed by a subsequent confirmatory PSA value 0.2 ng/mL or greater. Values obtained with different assay methods or kits cannot be used interchangeably. Results cannot be interpreted as absolute evidence of the presence or absence of malignant disease.   TSH     Status: None   Collection Time: 01/24/21  9:19 AM  Result Value Ref Range   TSH 0.719 0.450 - 4.500 uIU/mL  Cardiovascular Risk Assessment     Status: None   Collection Time: 01/24/21  9:19 AM  Result Value Ref Range   Interpretation Note     Comment: Supplemental report is available.     Objective: General: Patient is awake, alert, and oriented x 3 and in no acute distress.  Integument: Skin is warm, dry and supple bilateral. Nails are tender, long, thickened and dystrophic with subungual debris, consistent with onychomycosis, 1-5 bilateral. No signs of infection. No open lesions or preulcerative lesions present bilateral. Remaining integument unremarkable.  Vasculature:  Dorsalis Pedis pulse 1/4 bilateral. Posterior Tibial pulse  1/4 bilateral.  Capillary fill time <3 sec 1-5 bilateral. Positive hair growth to the level of the digits. Temperature gradient within normal limits. No varicosities present bilateral. No edema present bilateral.   Neurology: The patient has intact sensation measured with a 5.07/10g Semmes Weinstein Monofilament at all pedal sites bilateral . Vibratory sensation diminished bilateral with tuning fork. No Babinski sign present bilateral.   Musculoskeletal: Asymptomatic pes planus pedal deformities noted bilateral. Muscular strength 5/5 in all lower extremity muscular groups bilateral without pain on range of motion . No tenderness with calf compression bilateral.  Assessment and Plan: Problem List Items Addressed This Visit       Endocrine   Diabetic polyneuropathy ([place])   Other Visit Diagnoses     Pain due to onychomycosis of nail    -  Primary   Dry skin           -Examined patient. -  Re-discussed and educated patient on diabetic foot care, especially with  regards to the vascular, neurological and musculoskeletal systems.  -Mechanically debrided all nails 1-5 bilateral using sterile nail nipper and filed with dremel without incident -Answered all patient questions -Patient to return  in 3 months for at risk foot care -Patient advised to call the office if any problems or questions arise in the meantime.  [name], DPM

## 2021-02-01 DIAGNOSIS — M25561 Pain in right knee: Secondary | ICD-10-CM | POA: Diagnosis not present

## 2021-02-01 DIAGNOSIS — Z79899 Other long term (current) drug therapy: Secondary | ICD-10-CM | POA: Diagnosis not present

## 2021-02-01 DIAGNOSIS — G8929 Other chronic pain: Secondary | ICD-10-CM | POA: Diagnosis not present

## 2021-02-01 DIAGNOSIS — M25562 Pain in left knee: Secondary | ICD-10-CM | POA: Diagnosis not present

## 2021-02-10 ENCOUNTER — Other Ambulatory Visit: Payer: Self-pay | Admitting: Cardiology

## 2021-02-12 DIAGNOSIS — M25561 Pain in right knee: Secondary | ICD-10-CM | POA: Diagnosis not present

## 2021-02-12 DIAGNOSIS — Z79899 Other long term (current) drug therapy: Secondary | ICD-10-CM | POA: Diagnosis not present

## 2021-02-12 DIAGNOSIS — G8929 Other chronic pain: Secondary | ICD-10-CM | POA: Diagnosis not present

## 2021-02-12 DIAGNOSIS — M25562 Pain in left knee: Secondary | ICD-10-CM | POA: Diagnosis not present

## 2021-02-17 ENCOUNTER — Other Ambulatory Visit: Payer: Self-pay | Admitting: Cardiology

## 2021-02-20 ENCOUNTER — Telehealth: Payer: Self-pay

## 2021-02-20 NOTE — Chronic Care Management (AMB) (Signed)
Chronic Care Management Pharmacy Assistant   Name: Brent Ortiz  MRN: [id number] DOB: 1961-05-14  MICIAH COVELLI is an 60 y.o. year old male who presents for his initial CCM visit with the clinical pharmacist.  Reason for Encounter: Chart Prep/ IQ  Recent office visits:  01/24/21- Reinaldo Meeker, MD- seen for chronic conditions,labs ordered, referral to pain clinic, no medication changes, no follow up documented 12/04/20- Reinaldo Meeker, MD- seen for prostatitis, referral to cardiology, referral to pain clinic, no medication changes, no follow up documented 11/21/20- Reinaldo Meeker, MD- seen for chronic pain syndrome,referral to orthopedics, referral to pain clinic, steroid injection administered, no medication changes, follow up 3 months 09/27/20- Reinaldo Meeker, MD- seen for chronic conditions, increased symbicort from 80-4.5 mcg/act to 160-4.5 mcg/act two puffs twice daily, labs ordered, follow up 4 months   Recent consult visits:  01/28/21- [name], DPM (Podiatry)- seen for pain due to onychomycosis of nail, debridement performed, no medication changes, follow up 3 months 01/07/21- Carlyon Shadow, PA-C (Orthopedic Surgery)- seen for acute/ chronic left knee pain, x- ray performed, instructed to begin outpatient PT, no medication changes, follow up as directed 12/27/20- Godfrey Pick Tobb, DO (Cardiology)- seen for dizziness, labs performed, zio monitor ordered, no medication changes, follow up 6 months 09/06/20- [name], DPM (Podiatry)- seen for pain due to onychomycosis of nail, debridement performed, no medication changes, follow up 3 months  Hospital visits:  None in previous 6 months  Medications: Outpatient Encounter Medications as of 02/20/2021  Medication Sig   acetaminophen (TYLENOL) 325 MG tablet Take 650 mg by mouth every 4 (four) hours as needed for mild pain.   albuterol (VENTOLIN HFA) 108 (90 Base) MCG/ACT inhaler INHALE 2 PUFFS INTO THE LUNGS EVERY 4 HOURS  AS NEEDED FOR WHEEZING OR SHORTNESS OF BREATH.   amLODipine (NORVASC) 5 MG tablet Take 5 mg by mouth daily.   colchicine-probenecid 0.5-500 MG tablet Take 1 tablet by mouth 2 (two) times daily as needed.   diclofenac Sodium (VOLTAREN) 1 % GEL Apply 2 g topically 4 (four) times daily.   ENTRESTO 24-26 MG Take by mouth.   ezetimibe (ZETIA) 10 MG tablet Take 10 mg by mouth daily.   gabapentin (NEURONTIN) 300 MG capsule Take 300 mg by mouth 3 (three) times daily.   Glucagon, rDNA, (GLUCAGON EMERGENCY) 1 MG KIT Inject 1 mg into the muscle as needed (BG less than 70).   GOODSENSE ASPIRIN LOW DOSE 81 MG EC tablet TAKE 1 TABLET ($RemoveB'81MG'MtFfavUe$ ) BY MOUTH DAILY. SWALLOW WHOLE.(YELLOW ROUND TAB WITH HEART)   insulin glargine (LANTUS) 100 UNIT/ML injection Inject 20 Units into the skin daily.   isosorbide mononitrate (IMDUR) 60 MG 24 hr tablet Take 60 mg by mouth daily.   meclizine (ANTIVERT) 12.5 MG tablet Take 12.5 mg by mouth 3 (three) times daily as needed for dizziness.   meloxicam (MOBIC) 15 MG tablet Take by mouth.   metFORMIN (GLUCOPHAGE) 500 MG tablet Take 500 mg by mouth 2 (two) times daily with a meal.   metoprolol succinate (TOPROL-XL) 25 MG 24 hr tablet TAKE 1 TABLET (25 MG TOTAL) BY MOUTH DAILY.(OVAL WHITE TAB WITH 564)   nicotine (NICODERM CQ - DOSED IN MG/24 HOURS) 14 mg/24hr patch Place 14 mg onto the skin daily.   nitroGLYCERIN (NITROSTAT) 0.4 MG SL tablet Place 1 tablet (0.4 mg total) under the tongue every 5 (five) minutes as needed. For chest pain   pantoprazole (PROTONIX) 40 MG tablet TAKE 1 TABLET($RemoveBefor'40MG'XzMuZYiTrnDp$ ) BY MOUTH DAILY.(OVALYELLOW  TAB WITH H126)   polyethylene glycol (MIRALAX / GLYCOLAX) 17 g packet Take 17 g by mouth daily.   potassium chloride SA (KLOR-CON) 20 MEQ tablet Take by mouth.   ranolazine (RANEXA) 500 MG 12 hr tablet Take 1 tablet (500 mg total) by mouth 2 (two) times daily.   rosuvastatin (CRESTOR) 20 MG tablet Take by mouth.   sertraline (ZOLOFT) 25 MG tablet TAKE 1 TABLET(25 MG)  BY MOUTH DAILY   spironolactone (ALDACTONE) 25 MG tablet TAKE 1/2 TABLET(12.5) BY MOUTH DAILY. (HALF TAN TAB)   SYMBICORT 160-4.5 MCG/ACT inhaler Inhale 2 puffs into the lungs 2 (two) times daily.   tamsulosin (FLOMAX) 0.4 MG CAPS capsule TAKE 1 CAPSULE (.$RemoveBef'4MG'WTGncRBXxe$  TOTAL) BY MOUTH DAILY.(GREEN AND TAN CAP WITH TAM 0.$RemoveBe'4MG'cTIxtgqDr$ )   traZODone (DESYREL) 150 MG tablet Take 1 tablet (150 mg total) by mouth at bedtime.   No facility-administered encounter medications on file as of 02/20/2021.     Lab Results  Component Value Date/Time   HGBA1C 7.6 (H) 01/24/2021 09:19 AM   HGBA1C 6.2 (H) 09/27/2020 09:08 AM   MICROALBUR 80 09/27/2020 10:46 AM     BP Readings from Last 3 Encounters:  01/24/21 (!) 142/90  12/27/20 116/78  12/04/20 (!) 150/64    Current Documented Medications albuterol 108 MCG/ACT-30 DS last filled 02/03/21 colchicine-probenecid 0.5-500 MG- 31 DS last filled 02/03/21 diclofenac Sodium  1 % GEL- 14 DS last filled 10/19/19 ezetimibe  10 MG - 30 DS last filled 08/23/2018 gabapentin  300 MG - 31 DS last filled 02/03/21 insulin glargine  100 UNIT/ML- 50 DS last filled 02/07/21 meloxicam 15 MG- 31 DS last filled 02/03/21 metoprolol succinate  25 MG- 31 DS last filled 02/03/21 nicotine  14 mg/24hr- 28 DS last filled 02/19/21 nitroGLYCERIN  0.4 MG SL - 25 DS last filled 02/03/21 pantoprazole  40 MG - 31 DS last filled 02/03/21 potassium chloride SA 20 MEQ- 31 DS last filled 02/03/21 ranolazine 500 MG - 31 DS last filled 02/03/21 sertraline  25 MG - 31 DS last filled 02/03/21 spironolactone  25 MG - 31 DS last filled 02/03/21 SYMBICORT 160-4.5 MCG/ACT - 30 DS last filled 02/03/21 tamsulosin 0.4 MG - 31 DS last filled 02/03/21 traZODone  150 MG- 31 DS last filled 02/03/21   Star Rating Drugs:  rosuvastatin  20 MG - 31 DS last filled 02/03/21 metFORMIN 500 MG - 31 DS last filled 02/03/21   No Fill Hx acetaminophen 325 MG tablet amLODipine  5 MG tablet polyethylene glycol 17 g  packet meclizine 12.5 MG tablet ENTRESTO 24-26 MG Glucagon, rDNA, 1 MG KIT GOODSENSE ASPIRIN LOW DOSE 81 MG EC tablet isosorbide mononitrate  60 MG 24 hr tablet  Have you seen any other providers since your last visit with PCP?  Patient stated he has not seen any other providers but he will be seen an orthopedist 08/26 for left knee pain  Any changes in your medications or health?  Patient stated he has had no changes in his medications  Any side effects from any medications?  Patient stated he occasionally experiences cramping after taking medications but is unsure which medication is the cause for this  Do you have an symptoms or problems not managed by your medications?  Patient stated his only unmanaged symptom is from cramping  Any concerns about your health right now?  Patient stated he is concerned about his ability to maintain his health on his own  Has your provider asked that you check blood pressure,  blood sugar, or follow special diet at home?  Patient stated he uses his arm monitor to check his BP and it usually runs no higher than 140/90.  Patient stated he does not own a BS monitor to check his BS at home Patient stated he does not have a specific diet and eats whenever he gets hungry. He stated that he does not have an appetite often and rarely eats three meals a day.  Do you get any type of exercise on a regular basis?  Patient stated he is mostly sedentary due to his knee, back, and hip pain. He mainly watches TV all day and ambulates with a cane and walker when needed.  Can you think of a goal you would like to reach for your health?  Patient could not think of any goals at the moment  Do you have any problems getting your medications?  Patient stated he does not have problems getting his medications, but does have problems with adherence. He stated "it is really hard to keep up with all of them". Patient indicated that his medications are pre packaged in "bubble  wrap"  Is there anything that you would like to discuss during the appointment?  Patient stated he would like to discuss transitioning into a nursing facility. He currently lives with his daughter who is a Furniture conservator/restorer and has 4 kids. He believes that he is currently unable to provide himself with the consistent care that he needs and believes he would benefit from round the clock care. Patient stated that he would like to make this transition as early as possible, even before his next PCP appointment in Nov. If able.   Leeam Cedrone Looman was reminded to have all medications, supplements and any blood glucose and blood pressure readings available for review with Clarise Cruz B. Joline Salt. D, at his office visit on 08/25 at 8:30 am .   Care Gaps: Last annual wellness RJJOA?41/66/06 If applicable: Last eye exam / retinopathy screening? 11/12/20 Last diabetic foot exam? 09/27/20  [facility] CPA, CMA

## 2021-02-21 DIAGNOSIS — M109 Gout, unspecified: Secondary | ICD-10-CM | POA: Diagnosis not present

## 2021-02-25 DIAGNOSIS — M542 Cervicalgia: Secondary | ICD-10-CM | POA: Diagnosis not present

## 2021-02-25 DIAGNOSIS — E119 Type 2 diabetes mellitus without complications: Secondary | ICD-10-CM | POA: Diagnosis not present

## 2021-02-25 DIAGNOSIS — I252 Old myocardial infarction: Secondary | ICD-10-CM | POA: Diagnosis not present

## 2021-02-25 DIAGNOSIS — S39012A Strain of muscle, fascia and tendon of lower back, initial encounter: Secondary | ICD-10-CM | POA: Diagnosis not present

## 2021-02-25 DIAGNOSIS — I2510 Atherosclerotic heart disease of native coronary artery without angina pectoris: Secondary | ICD-10-CM | POA: Diagnosis not present

## 2021-02-25 DIAGNOSIS — I10 Essential (primary) hypertension: Secondary | ICD-10-CM | POA: Diagnosis not present

## 2021-02-25 DIAGNOSIS — M25511 Pain in right shoulder: Secondary | ICD-10-CM | POA: Diagnosis not present

## 2021-02-25 DIAGNOSIS — E785 Hyperlipidemia, unspecified: Secondary | ICD-10-CM | POA: Diagnosis not present

## 2021-02-25 DIAGNOSIS — M545 Low back pain, unspecified: Secondary | ICD-10-CM | POA: Diagnosis not present

## 2021-02-25 DIAGNOSIS — J449 Chronic obstructive pulmonary disease, unspecified: Secondary | ICD-10-CM | POA: Diagnosis not present

## 2021-02-25 DIAGNOSIS — M549 Dorsalgia, unspecified: Secondary | ICD-10-CM | POA: Diagnosis not present

## 2021-02-25 DIAGNOSIS — M25512 Pain in left shoulder: Secondary | ICD-10-CM | POA: Diagnosis not present

## 2021-02-25 DIAGNOSIS — F17210 Nicotine dependence, cigarettes, uncomplicated: Secondary | ICD-10-CM | POA: Diagnosis not present

## 2021-02-25 NOTE — Progress Notes (Deleted)
Chronic Care Management Pharmacy Note  02/25/2021 Name:  Brent Ortiz MRN:  [id number] DOB:  01-10-1961  Summary: ***  Recommendations/Changes made from today's visit: ***  Plan: ***   Subjective: Brent Ortiz is an 60 y.o. year old male who is a primary patient of Brent Ortiz, Zeb Comfort, MD.  The CCM team was consulted for assistance with disease management and care coordination needs.    Engaged with patient face to face for initial visit in response to provider referral for pharmacy case management and/or care coordination services.   Consent to Services:  The patient was given the following information about Chronic Care Management services today, agreed to services, and gave verbal consent: 1. CCM service includes personalized support from designated clinical staff supervised by the primary care provider, including individualized plan of care and coordination with other care providers 2. 24/7 contact phone numbers for assistance for urgent and routine care needs. 3. Service will only be billed when office clinical staff spend 20 minutes or more in a month to coordinate care. 4. Only one practitioner may furnish and bill the service in a calendar month. 5.The patient may stop CCM services at any time (effective at the end of the month) by phone call to the office staff. 6. The patient will be responsible for cost sharing (co-pay) of up to 20% of the service fee (after annual deductible is met). Patient agreed to services and consent obtained.  Patient Care Team: [name], MD as PCP - General (Family Medicine) Burnice Logan, [facility] as Pharmacist (Pharmacist)    Recent office visits:  01/24/21- Reinaldo Meeker, MD- seen for chronic conditions,labs ordered, referral to pain clinic, no medication changes, no follow up documented 12/04/20- Reinaldo Meeker, MD- seen for prostatitis, referral to cardiology, referral to pain clinic, no medication changes, no follow up  documented 11/21/20- Reinaldo Meeker, MD- seen for chronic pain syndrome,referral to orthopedics, referral to pain clinic, steroid injection administered, no medication changes, follow up 3 months 09/27/20- Reinaldo Meeker, MD- seen for chronic conditions, increased symbicort from 80-4.5 mcg/act to 160-4.5 mcg/act two puffs twice daily, labs ordered, follow up 4 months    Recent consult visits:  01/28/21- [name], DPM (Podiatry)- seen for pain due to onychomycosis of nail, debridement performed, no medication changes, follow up 3 months 01/07/21- Carlyon Shadow, PA-C (Orthopedic Surgery)- seen for acute/ chronic left knee pain, x- ray performed, instructed to begin outpatient PT, no medication changes, follow up as directed 12/27/20- Godfrey Pick Tobb, DO (Cardiology)- seen for dizziness, labs performed, zio monitor ordered, no medication changes, follow up 6 months 09/06/20- [name], DPM (Podiatry)- seen for pain due to onychomycosis of nail, debridement performed, no medication changes, follow up 3 months   Hospital visits:  None in previous 6 months   Objective:  Lab Results  Component Value Date   CREATININE 1.16 01/24/2021   BUN 16 01/24/2021   GFRNONAA 68 06/26/2020   GFRAA 78 06/26/2020   NA 136 01/24/2021   K 5.0 01/24/2021   CALCIUM 9.1 01/24/2021   CO2 20 01/24/2021   GLUCOSE 177 (H) 01/24/2021    Lab Results  Component Value Date/Time   HGBA1C 7.6 (H) 01/24/2021 09:19 AM   HGBA1C 6.2 (H) 09/27/2020 09:08 AM   MICROALBUR 80 09/27/2020 10:46 AM    Last diabetic Eye exam:  Lab Results  Component Value Date/Time   HMDIABEYEEXA No Retinopathy 11/12/2020 12:00 AM    Last diabetic Foot exam: No results found for:  HMDIABFOOTEX   Lab Results  Component Value Date   CHOL 217 (H) 01/24/2021   HDL 65 01/24/2021   LDLCALC 129 (H) 01/24/2021   TRIG 132 01/24/2021   CHOLHDL 3.3 01/24/2021    Hepatic Function Latest Ref Rng & Units 01/24/2021 10/08/2020 09/27/2020   Total Protein 6.0 - 8.5 g/dL 6.3 6.5 7.0  Albumin 3.8 - 4.8 g/dL 4.3 4.1 4.6  AST 0 - 40 IU/L _0 ALT 0 - 44 IU/L _1 Alk Phosphatase 44 - 121 IU/L 76 59 62  Total Bilirubin 0.0 - 1.2 mg/dL 1.0 0.3 0.5    Lab Results  Component Value Date/Time   TSH 0.719 01/24/2021 09:19 AM    CBC Latest Ref Rng & Units 01/24/2021 09/27/2020 06/26/2020  WBC 3.4 - 10.8 x10E3/uL 8.4 9.0 13.7(H)  Hemoglobin 13.0 - 17.7 g/dL 15.3 15.2 14.7  Hematocrit 37.5 - 51.0 % 44.0 45.5 43.3  Platelets 150 - 450 x10E3/uL 253 373 566(H)    No results found for: VD25OH  Clinical ASCVD: Yes  The ASCVD Risk score [name], et al., 2013) failed to calculate for the following reasons:   The patient has a prior MI or stroke diagnosis    Depression screen [facility] 2/9 01/24/2021 09/27/2020 04/24/2020  Decreased Interest 1 0 3  Down, Depressed, Hopeless 1 0 3  PHQ - 2 Score 2 0 6  Altered sleeping 2 0 2  Tired, decreased energy 2 0 3  Change in appetite 1 0 2  Feeling bad or failure about yourself  1 0 3  Trouble concentrating 1 0 3  Moving slowly or fidgety/restless 1 0 2  Suicidal thoughts 0 0 0  PHQ-9 Score 10 0 21  Difficult doing work/chores Somewhat difficult Not difficult at all Very difficult     Other: (CHADS2VASc if Afib, MMRC or CAT for COPD, ACT, DEXA)  Social History   Tobacco Use  Smoking Status Every Day   Packs/day: 1.00   Years: 50.00   Pack years: 50.00   Types: Cigarettes  Smokeless Tobacco Never   BP Readings from Last 3 Encounters:  01/24/21 (!) 142/90  12/27/20 116/78  12/04/20 (!) 150/64   Pulse Readings from Last 3 Encounters:  01/24/21 78  12/27/20 85  12/04/20 76   Wt Readings from Last 3 Encounters:  01/24/21 (!) 302 lb (137 kg)  12/27/20 (!) 312 lb (141.5 kg)  12/04/20 (!) 302 lb (137 kg)   BMI Readings from Last 3 Encounters:  01/24/21 45.92 kg/m  12/27/20 47.44 kg/m  12/04/20 45.92 kg/m    Assessment/Interventions: Review of patient past  medical history, allergies, medications, health status, including review of consultants reports, laboratory and other test data, was performed as part of comprehensive evaluation and provision of chronic care management services.   SDOH:  (Social Determinants of Health) assessments and interventions performed: Yes  SDOH Screenings   Alcohol Screen: Medium Risk   Last Alcohol Screening Score (AUDIT): 40  Depression (PHQ2-9): Medium Risk   PHQ-2 Score: 10  Financial Resource Strain: Low Risk    Difficulty of Paying Living Expenses: Not very hard  Food Insecurity: No Food Insecurity   Worried About Charity fundraiser in the Last Year: Never true   Ran Out of Food in the Last Year: Never true  Housing: High Risk   Last Housing Risk Score: 4  Physical Activity: Inactive   Days of Exercise per Week: 0 days   Minutes  of Exercise per Session: 0 min  Social Connections: Moderately Integrated   Frequency of Communication with Friends and Family: Twice a week   Frequency of Social Gatherings with Friends and Family: Once a week   Attends Religious Services: More than 4 times per year   Active Member of [employer] or Organizations: Yes   Attends Archivist Meetings: More than 4 times per year   Marital Status: Widowed  Stress: No Stress Concern Present   Feeling of Stress : Not at all  Tobacco Use: High Risk   Smoking Tobacco Use: Every Day   Smokeless Tobacco Use: Never  Transportation Needs: No Transportation Needs   Lack of Transportation (Medical): No   Lack of Transportation (Non-Medical): No    CCM Care Plan  Allergies  Allergen Reactions   Motrin [Ibuprofen] Other (See Comments)    Reaction: ulcers   Other Nausea And Vomiting    Patient has ulcers    Medications Reviewed Today     Reviewed by [name], DPM (Physician) on 01/28/21 at 1211  Med List Status: <None>   Medication Order Taking? Sig Documenting Provider Last Dose Status Informant  acetaminophen  (TYLENOL) 325 MG tablet [id number] No Take 650 mg by mouth every 4 (four) hours as needed for mild pain. [provider] Taking Active   albuterol (VENTOLIN HFA) 108 (90 Base) MCG/ACT inhaler [id number] No INHALE 2 PUFFS INTO THE LUNGS EVERY 4 HOURS AS NEEDED FOR WHEEZING OR SHORTNESS OF BREATH. [name], MD Taking Active   amLODipine (NORVASC) 5 MG tablet [id number] No Take 5 mg by mouth daily. [provider] Taking Active   colchicine-probenecid 0.5-500 MG tablet [id number] No Take 1 tablet by mouth 2 (two) times daily as needed. [provider] Taking Active   diclofenac Sodium (VOLTAREN) 1 % GEL [id number] No Apply 2 g topically 4 (four) times daily. [provider] Taking Active   ENTRESTO 24-26 MG [id number] No Take by mouth. [provider] Taking Active   ezetimibe (ZETIA) 10 MG tablet [id number] No Take 10 mg by mouth daily. [provider] Taking Active Self  gabapentin (NEURONTIN) 300 MG capsule [id number] No Take 300 mg by mouth 3 (three) times daily. [provider] Taking Active   Glucagon, rDNA, (GLUCAGON EMERGENCY) 1 MG KIT [id number] No Inject 1 mg into the muscle as needed (BG less than 70). [provider] Taking Active   GOODSENSE ASPIRIN LOW DOSE 81 MG EC tablet [id number] No TAKE 1 TABLET (81MG) BY MOUTH DAILY. SWALLOW WHOLE.(YELLOW ROUND TAB WITH HEART) [name], MD Taking Active   insulin glargine (LANTUS) 100 UNIT/ML injection [id number] No Inject 20 Units into the skin daily. [provider] Taking Active   isosorbide mononitrate (IMDUR) 60 MG 24 hr tablet [id number] No Take 60 mg by mouth daily. [provider] Taking Active   meclizine (ANTIVERT) 12.5 MG tablet [id number] No Take 12.5 mg by mouth 3 (three) times daily as needed for dizziness. [provider] Taking Active   meloxicam (MOBIC) 15 MG tablet [id number] No Take by mouth. [provider] Taking  Active   metFORMIN (GLUCOPHAGE) 500 MG tablet [id number] No Take 500 mg by mouth 2 (two) times daily with a meal. [provider] Taking Active   metoprolol succinate (TOPROL-XL) 25 MG 24 hr tablet [id number] No TAKE 1 TABLET (25 MG TOTAL) BY MOUTH DAILY.(OVAL WHITE TAB WITH 564) [name], MD Taking Active   nicotine (NICODERM CQ - DOSED  IN MG/24 HOURS) 14 mg/24hr patch [id number] No Place 14 mg onto the skin daily. [provider] Taking Active   nitroGLYCERIN (NITROSTAT) 0.4 MG SL tablet [id number] No Place 1 tablet (0.4 mg total) under the tongue every 5 (five) minutes as needed. For chest pain [name], MD Taking Active   pantoprazole (PROTONIX) 40 MG tablet [id number] No TAKE 1 TABLET(40MG) BY MOUTH DAILY.(OVALYELLOW TAB WITH H126) [name], MD Taking Active   polyethylene glycol (MIRALAX / GLYCOLAX) 17 g packet [id number] No Take 17 g by mouth daily. [provider] Taking Active   potassium chloride SA (KLOR-CON) 20 MEQ tablet [id number] No Take by mouth. [provider] Taking Active   ranolazine (RANEXA) 500 MG 12 hr tablet [id number] No Take 1 tablet (500 mg total) by mouth 2 (two) times daily. [name], Kirsten, MD Taking Active   rosuvastatin (CRESTOR) 20 MG tablet [id number] No Take by mouth. [provider] Taking Active   sertraline (ZOLOFT) 25 MG tablet [id number] No TAKE 1 TABLET(25 MG) BY MOUTH DAILY [name], MD Taking Active   spironolactone (ALDACTONE) 25 MG tablet [id number] No Take 12.5 mg by mouth daily. [provider] Taking Active   SYMBICORT 160-4.5 MCG/ACT inhaler [id number] No Inhale 2 puffs into the lungs 2 (two) times daily. [name], MD Taking Active   tamsulosin [facility]) 0.4 MG CAPS capsule [id number] No TAKE 1 CAPSULE (.4MG TOTAL) BY MOUTH DAILY.(GREEN AND TAN CAP WITH TAM 0.4MG) [name], MD Taking Active   traZODone (DESYREL) 150 MG tablet  [id number] No Take 1 tablet (150 mg total) by mouth at bedtime. [name], MD Taking Active             Patient Active Problem List   Diagnosis Date Noted   Coronary artery disease involving native coronary artery of native heart without angina pectoris 12/27/2020   Stage 3b chronic kidney disease ([place]) 12/27/2020   Prostatitis 12/04/2020   Morbid obesity ([address]) 11/21/2020   Depression, major, single episode, moderate ([place]) 07/11/2020   Coronary artery disease involving native heart 02/08/2020   HFrEF (heart failure with reduced ejection fraction) ([place]) 02/08/2020   Acute combined systolic and diastolic CHF, NYHA class 2 ([place]) 01/22/2020   ED (erectile dysfunction) 12/06/2019   Diabetic polyneuropathy ([place]) 12/06/2019   Chronic pain 10/19/2019   Obstructive chronic bronchitis with exacerbation ([place]) 10/03/2019   Mixed hyperlipidemia 06/18/2017   Alcohol abuse    Opiate abuse, continuous ([place])    Polysubstance abuse ([place])    Atherosclerosis of coronary artery of native heart with stable angina pectoris, unspecified vessel or lesion type ([place]) 01/23/2016   Essential hypertension 01/23/2016   Tobacco dependence 01/23/2016   Tobacco use disorder 01/23/2016   BMI 45.0-49.9, adult ([place]) 01/13/2013   Knee pain, left 02/12/2012   Essential thrombocythemia ([place]) 09/29/2011   Pneumonia due to infectious organism 09/04/2011    Immunization History  Administered Date(s) Administered   Influenza Inj Mdck Quad Pf 06/03/2020   Influenza-Unspecified 05/07/2019   PFIZER(Purple Top)SARS-COV-2 Vaccination 10/26/2019, 11/24/2019, 07/13/2020   Pneumococcal-Unspecified 05/07/2019   Zoster Recombinat (Shingrix) 05/07/2019    Conditions to be addressed/monitored:  Hypertension, Hyperlipidemia, Diabetes, Heart Failure, Coronary Artery Disease, Tobacco use, and ***  There are no care plans that you recently modified to display for this patient.    Medication Assistance:  Application for ***  medication assistance program. in process.  Anticipated assistance start date ***.  See plan of care for additional detail.  Compliance/Adherence/Medication fill history: Care Gaps: Last annual wellness ASNKN?39/76/73 If applicable: Last eye exam / retinopathy screening? 11/12/20 Last diabetic foot exam? 09/27/20  Star Rating Drugs:  rosuvastatin  20 MG - 31 DS last filled 02/03/21 metFORMIN 500 MG - 31 DS last filled 02/03/21  Patient's preferred pharmacy is:  [place], [place] [address] Phone: [phone] Fax: [place], [place] [address] Phone: [phone] Fax: [place] [place] [address] Phone: [phone] Fax: [phone]  Uses pill box? Yes Pt endorses ***% compliance  We discussed: Current pharmacy is preferred with insurance plan and patient is satisfied with pharmacy services Patient decided to: Continue current medication management strategy  Care Plan and Follow Up Patient Decision:  Patient agrees to Care Plan and Follow-up.  Plan: Telephone follow up appointment with care management team member scheduled for:  ***  ***  Current Barriers:  Unable to achieve control of ***   Pharmacist Clinical Goal(s):  Patient will achieve control of *** as evidenced by *** through collaboration with PharmD and provider.   Interventions: 1:1 collaboration with [name], MD regarding development and update of comprehensive plan of care as evidenced by provider attestation and co-signature Inter-disciplinary care team collaboration (see longitudinal plan of care) Comprehensive medication review performed; medication list updated in electronic medical  record  Hypertension (BP goal <130/80) -{US controlled/uncontrolled:25276} -Current treatment: Amlodipine 5 mg daily  Metoprolol succinate 25 mg daily  Spironolactone 25 mg 1/2 tablet daily  -Medications previously tried: ***  -Current home readings: *** -Current dietary habits: *** -Current exercise habits: *** -{ACTIONS;DENIES/REPORTS:21021675::"Denies"} hypotensive/hypertensive symptoms -Educated on {CCM BP Counseling:25124} -Counseled to monitor BP at home ***, document, and provide log at future appointments -{CCMPHARMDINTERVENTION:25122}  Hyperlipidemia: (LDL goal < ***) -{US controlled/uncontrolled:25276} -Current treatment: ***aspirin ec 81 mg daily  Zetia 10 mg daily  Rosuvastatin 20 mg *** Nitroglycerin 0.4 mg sl every 5 minutes for chest pain prn  -Medications previously tried: ***  -Current dietary patterns: *** -Current exercise habits: *** -Educated on {CCM HLD Counseling:25126} -{CCMPHARMDINTERVENTION:25122}  Diabetes (A1c goal {A1c goals:23924}) -{US controlled/uncontrolled:25276} -Current medications: ***glucagon 1 mg into the muscle as needed for blood sugar <70  Lantus 100 units/ml 20 units into the skin daily  Metformin 500 mg bid with a meal -Medications previously tried: ***  -Current home glucose readings fasting glucose: *** post prandial glucose: *** -{ACTIONS;DENIES/REPORTS:21021675::"Denies"} hypoglycemic/hyperglycemic symptoms -Current meal patterns:  breakfast: ***  lunch: ***  dinner: *** snacks: *** drinks: *** -Current exercise: *** -Educated on {CCM DM COUNSELING:25123} -Counseled to check feet daily and get yearly eye exams -{CCMPHARMDINTERVENTION:25122}  Heart Failure (Goal: manage symptoms and prevent exacerbations) -{US controlled/uncontrolled:25276} -Last ejection fraction: *** (Date: ***) -HF type: {type of heart failure:30421350} -NYHA Class: {CHL HP Upstream Pharm NYHA [phone]} -AHA HF Stage: {CHL HP Upstream  Pharm AHA HF [phone]} -Current treatment: Entresto 24-26 mg *** Amlodipine 5 mg daily  Isosorbide mn 60 mg daily  Metoprolol succinate 25 mg daily  Ranolazine 500 mg bid  Spironolactone 25 mg 1/2 tablet daily  Potassium 20 meq *** -Medications previously tried: ***  -Current home BP/HR readings: *** -Current dietary habits: *** -Current exercise habits: *** -Educated on {CCM HF Counseling:25125} -{CCMPHARMDINTERVENTION:25122}  COPD (Goal: control symptoms and  prevent exacerbations) -{US controlled/uncontrolled:25276} -Current treatment  ***Symbicort 160-4.5 mcg/act 2 puffs bid  Albuterol 2 puffs every 4 hours prn wheezing or shortness of breath -Medications previously tried: ***  -Gold Grade: {CHL HP Upstream Pharm COPD Gold DHWYS:[id number]} -Current COPD Classification:  {CHL HP Upstream Pharm COPD Classification:5203065823} -MMRC/CAT score: *** -Pulmonary function testing: *** -Exacerbations requiring treatment in last 6 months: *** -Patient {Actions; denies-reports:120008} consistent use of maintenance inhaler -Frequency of rescue inhaler use: *** -Counseled on {CCMINHALERCOUNSELING:25121} -{CCMPHARMDINTERVENTION:25122}   Tobacco use (Goal ***) -{US controlled/uncontrolled:25276} -Previous quit attempts: *** -Current treatment  ***nicotine 14 mg/24 hour patch -Patient smokes {Time to first cigarette:23873} -Patient triggers include: {Smoking Triggers:23882} -On a scale of 1-10, reports MOTIVATION to quit is *** -On a scale of 1-10, reports CONFIDENCE in quitting is *** -{Smoking Cessation Counseling:23883} -{CCMPHARMDINTERVENTION:25122}  Depression/Anxiety (Goal: ***) -{US controlled/uncontrolled:25276} -Current treatment: ***sertraline 25 mg daily  Trazodone 150 mg daily at bedtime  -Medications previously tried/failed: *** -PHQ9: *** -GAD7: *** -Connected with *** for mental health support -Educated on {CCM mental health  counseling:25127} -{CCMPHARMDINTERVENTION:25122}  *** (Goal: ***) -{US controlled/uncontrolled:25276} -Current treatment  *** Pantoprazole 40 mg daily  Miralax 17 grams daily  -Medications previously tried: ***  -{CCMPHARMDINTERVENTION:25122}  BPH (Goal: ***) -{US controlled/uncontrolled:25276} -Current treatment  Tamsulosin 0.4 mg daily  -Medications previously tried: ***  -{CCMPHARMDINTERVENTION:25122}   *** (Goal: ***) -{US controlled/uncontrolled:25276} -Current treatment  ***colchicine-probenecid 0.5-500 mg bid prn  -Medications previously tried: ***  -{CCMPHARMDINTERVENTION:25122}  *** (Goal: ***) -{US controlled/uncontrolled:25276} -Current treatment  Acetaminophen 325 mg 2 tablets every 4 hours prn mild pain  Meloxicam 15 mg *** Gabapentin 300 mg tid  Voltaren gel 2 grams qid -Medications previously tried: ***  -{CCMPHARMDINTERVENTION:25122}   Health Maintenance -Vaccine gaps: *** -Current therapy:  ***meclizine 12.5 mg tid prn dizziness -Educated on {ccm supplement counseling:25128} -{CCM Patient satisfied:25129} -{CCMPHARMDINTERVENTION:25122}  Patient Goals/Self-Care Activities Patient will:  - {pharmacypatientgoals:24919}  Follow Up Plan: {CM FOLLOW UP JDBZ:20802}

## 2021-02-27 ENCOUNTER — Other Ambulatory Visit: Payer: Self-pay

## 2021-02-27 ENCOUNTER — Ambulatory Visit: Payer: Medicare Other

## 2021-02-27 DIAGNOSIS — F321 Major depressive disorder, single episode, moderate: Secondary | ICD-10-CM

## 2021-02-27 MED ORDER — TRAZODONE HCL 150 MG PO TABS: 150.0000 mg | ORAL_TABLET | Freq: Every evening | ORAL | 2 refills | Status: DC

## 2021-02-27 MED ORDER — SERTRALINE HCL 25 MG PO TABS: 25.0000 mg | ORAL_TABLET | Freq: Every day | ORAL | 2 refills | Status: DC

## 2021-02-27 NOTE — Progress Notes (Deleted)
Chronic Care Management Pharmacy Note  02/27/2021 Name:  Brent Ortiz MRN:  [id number] DOB:  05-19-1961  Summary: ***  Recommendations/Changes made from today's visit: ***  Plan: ***   Subjective: Brent Ortiz is an 60 y.o. year old male who is a primary patient of [name], Zeb Comfort, MD.  The CCM team was consulted for assistance with disease management and care coordination needs.    Engaged with patient face to face for initial visit in response to provider referral for pharmacy case management and/or care coordination services.   Consent to Services:  The patient was given the following information about Chronic Care Management services today, agreed to services, and gave verbal consent: 1. CCM service includes personalized support from designated clinical staff supervised by the primary care provider, including individualized plan of care and coordination with other care providers 2. 24/7 contact phone numbers for assistance for urgent and routine care needs. 3. Service will only be billed when office clinical staff spend 20 minutes or more in a month to coordinate care. 4. Only one practitioner may furnish and bill the service in a calendar month. 5.The patient may stop CCM services at any time (effective at the end of the month) by phone call to the office staff. 6. The patient will be responsible for cost sharing (co-pay) of up to 20% of the service fee (after annual deductible is met). Patient agreed to services and consent obtained.  Patient Care Team: [name], MD as PCP - General (Family Medicine) Burnice Logan, [facility] as Pharmacist (Pharmacist)    Recent office visits:  01/24/21- Reinaldo Meeker, MD- seen for chronic conditions,labs ordered, referral to pain clinic, no medication changes, no follow up documented 12/04/20- Reinaldo Meeker, MD- seen for prostatitis, referral to cardiology, referral to pain clinic, no medication changes, no follow up  documented 11/21/20- Reinaldo Meeker, MD- seen for chronic pain syndrome,referral to orthopedics, referral to pain clinic, steroid injection administered, no medication changes, follow up 3 months 09/27/20- Reinaldo Meeker, MD- seen for chronic conditions, increased symbicort from 80-4.5 mcg/act to 160-4.5 mcg/act two puffs twice daily, labs ordered, follow up 4 months    Recent consult visits:  01/28/21- [name], DPM (Podiatry)- seen for pain due to onychomycosis of nail, debridement performed, no medication changes, follow up 3 months 01/07/21- Carlyon Shadow, PA-C (Orthopedic Surgery)- seen for acute/ chronic left knee pain, x- ray performed, instructed to begin outpatient PT, no medication changes, follow up as directed 12/27/20- Godfrey Pick Tobb, DO (Cardiology)- seen for dizziness, labs performed, zio monitor ordered, no medication changes, follow up 6 months 09/06/20- [name], DPM (Podiatry)- seen for pain due to onychomycosis of nail, debridement performed, no medication changes, follow up 3 months   Hospital visits:  None in previous 6 months   Objective:  Lab Results  Component Value Date   CREATININE 1.16 01/24/2021   BUN 16 01/24/2021   GFRNONAA 68 06/26/2020   GFRAA 78 06/26/2020   NA 136 01/24/2021   K 5.0 01/24/2021   CALCIUM 9.1 01/24/2021   CO2 20 01/24/2021   GLUCOSE 177 (H) 01/24/2021    Lab Results  Component Value Date/Time   HGBA1C 7.6 (H) 01/24/2021 09:19 AM   HGBA1C 6.2 (H) 09/27/2020 09:08 AM   MICROALBUR 80 09/27/2020 10:46 AM    Last diabetic Eye exam:  Lab Results  Component Value Date/Time   HMDIABEYEEXA No Retinopathy 11/12/2020 12:00 AM    Last diabetic Foot exam: No results found for:  HMDIABFOOTEX   Lab Results  Component Value Date   CHOL 217 (H) 01/24/2021   HDL 65 01/24/2021   LDLCALC 129 (H) 01/24/2021   TRIG 132 01/24/2021   CHOLHDL 3.3 01/24/2021    Hepatic Function Latest Ref Rng & Units 01/24/2021 10/08/2020 09/27/2020   Total Protein 6.0 - 8.5 g/dL 6.3 6.5 7.0  Albumin 3.8 - 4.8 g/dL 4.3 4.1 4.6  AST 0 - 40 IU/L _0 ALT 0 - 44 IU/L _1 Alk Phosphatase 44 - 121 IU/L 76 59 62  Total Bilirubin 0.0 - 1.2 mg/dL 1.0 0.3 0.5    Lab Results  Component Value Date/Time   TSH 0.719 01/24/2021 09:19 AM    CBC Latest Ref Rng & Units 01/24/2021 09/27/2020 06/26/2020  WBC 3.4 - 10.8 x10E3/uL 8.4 9.0 13.7(H)  Hemoglobin 13.0 - 17.7 g/dL 15.3 15.2 14.7  Hematocrit 37.5 - 51.0 % 44.0 45.5 43.3  Platelets 150 - 450 x10E3/uL 253 373 566(H)    No results found for: VD25OH  Clinical ASCVD: Yes  The ASCVD Risk score [name], et al., 2013) failed to calculate for the following reasons:   The patient has a prior MI or stroke diagnosis    Depression screen [facility] 2/9 01/24/2021 09/27/2020 04/24/2020  Decreased Interest 1 0 3  Down, Depressed, Hopeless 1 0 3  PHQ - 2 Score 2 0 6  Altered sleeping 2 0 2  Tired, decreased energy 2 0 3  Change in appetite 1 0 2  Feeling bad or failure about yourself  1 0 3  Trouble concentrating 1 0 3  Moving slowly or fidgety/restless 1 0 2  Suicidal thoughts 0 0 0  PHQ-9 Score 10 0 21  Difficult doing work/chores Somewhat difficult Not difficult at all Very difficult     Other: (CHADS2VASc if Afib, MMRC or CAT for COPD, ACT, DEXA)  Social History   Tobacco Use  Smoking Status Every Day   Packs/day: 1.00   Years: 50.00   Pack years: 50.00   Types: Cigarettes  Smokeless Tobacco Never   BP Readings from Last 3 Encounters:  01/24/21 (!) 142/90  12/27/20 116/78  12/04/20 (!) 150/64   Pulse Readings from Last 3 Encounters:  01/24/21 78  12/27/20 85  12/04/20 76   Wt Readings from Last 3 Encounters:  01/24/21 (!) 302 lb (137 kg)  12/27/20 (!) 312 lb (141.5 kg)  12/04/20 (!) 302 lb (137 kg)   BMI Readings from Last 3 Encounters:  01/24/21 45.92 kg/m  12/27/20 47.44 kg/m  12/04/20 45.92 kg/m    Assessment/Interventions: Review of patient past  medical history, allergies, medications, health status, including review of consultants reports, laboratory and other test data, was performed as part of comprehensive evaluation and provision of chronic care management services.   SDOH:  (Social Determinants of Health) assessments and interventions performed: Yes  SDOH Screenings   Alcohol Screen: Medium Risk   Last Alcohol Screening Score (AUDIT): 40  Depression (PHQ2-9): Medium Risk   PHQ-2 Score: 10  Financial Resource Strain: Low Risk    Difficulty of Paying Living Expenses: Not very hard  Food Insecurity: No Food Insecurity   Worried About Charity fundraiser in the Last Year: Never true   Ran Out of Food in the Last Year: Never true  Housing: High Risk   Last Housing Risk Score: 4  Physical Activity: Inactive   Days of Exercise per Week: 0 days   Minutes  of Exercise per Session: 0 min  Social Connections: Moderately Integrated   Frequency of Communication with Friends and Family: Twice a week   Frequency of Social Gatherings with Friends and Family: Once a week   Attends Religious Services: More than 4 times per year   Active Member of [employer] or Organizations: Yes   Attends Archivist Meetings: More than 4 times per year   Marital Status: Widowed  Stress: No Stress Concern Present   Feeling of Stress : Not at all  Tobacco Use: High Risk   Smoking Tobacco Use: Every Day   Smokeless Tobacco Use: Never  Transportation Needs: No Transportation Needs   Lack of Transportation (Medical): No   Lack of Transportation (Non-Medical): No    CCM Care Plan  Allergies  Allergen Reactions   Motrin [Ibuprofen] Other (See Comments)    Reaction: ulcers   Other Nausea And Vomiting    Patient has ulcers    Medications Reviewed Today     Reviewed by [name], DPM (Physician) on 01/28/21 at 1211  Med List Status: <None>   Medication Order Taking? Sig Documenting Provider Last Dose Status Informant  acetaminophen  (TYLENOL) 325 MG tablet [id number] No Take 650 mg by mouth every 4 (four) hours as needed for mild pain. [provider] Taking Active   albuterol (VENTOLIN HFA) 108 (90 Base) MCG/ACT inhaler [id number] No INHALE 2 PUFFS INTO THE LUNGS EVERY 4 HOURS AS NEEDED FOR WHEEZING OR SHORTNESS OF BREATH. [name], MD Taking Active   amLODipine (NORVASC) 5 MG tablet [id number] No Take 5 mg by mouth daily. [provider] Taking Active   colchicine-probenecid 0.5-500 MG tablet [id number] No Take 1 tablet by mouth 2 (two) times daily as needed. [provider] Taking Active   diclofenac Sodium (VOLTAREN) 1 % GEL [id number] No Apply 2 g topically 4 (four) times daily. [provider] Taking Active   ENTRESTO 24-26 MG [id number] No Take by mouth. [provider] Taking Active   ezetimibe (ZETIA) 10 MG tablet [id number] No Take 10 mg by mouth daily. [provider] Taking Active Self  gabapentin (NEURONTIN) 300 MG capsule [id number] No Take 300 mg by mouth 3 (three) times daily. [provider] Taking Active   Glucagon, rDNA, (GLUCAGON EMERGENCY) 1 MG KIT [id number] No Inject 1 mg into the muscle as needed (BG less than 70). [provider] Taking Active   GOODSENSE ASPIRIN LOW DOSE 81 MG EC tablet [id number] No TAKE 1 TABLET (81MG) BY MOUTH DAILY. SWALLOW WHOLE.(YELLOW ROUND TAB WITH HEART) [name], MD Taking Active   insulin glargine (LANTUS) 100 UNIT/ML injection [id number] No Inject 20 Units into the skin daily. [provider] Taking Active   isosorbide mononitrate (IMDUR) 60 MG 24 hr tablet [id number] No Take 60 mg by mouth daily. [provider] Taking Active   meclizine (ANTIVERT) 12.5 MG tablet [id number] No Take 12.5 mg by mouth 3 (three) times daily as needed for dizziness. [provider] Taking Active   meloxicam (MOBIC) 15 MG tablet [id number] No Take by mouth. [provider] Taking  Active   metFORMIN (GLUCOPHAGE) 500 MG tablet [id number] No Take 500 mg by mouth 2 (two) times daily with a meal. [provider] Taking Active   metoprolol succinate (TOPROL-XL) 25 MG 24 hr tablet [id number] No TAKE 1 TABLET (25 MG TOTAL) BY MOUTH DAILY.(OVAL WHITE TAB WITH 564) [name], MD Taking Active   nicotine (NICODERM CQ - DOSED  IN MG/24 HOURS) 14 mg/24hr patch [id number] No Place 14 mg onto the skin daily. [provider] Taking Active   nitroGLYCERIN (NITROSTAT) 0.4 MG SL tablet [id number] No Place 1 tablet (0.4 mg total) under the tongue every 5 (five) minutes as needed. For chest pain [name], MD Taking Active   pantoprazole (PROTONIX) 40 MG tablet [id number] No TAKE 1 TABLET(40MG) BY MOUTH DAILY.(OVALYELLOW TAB WITH H126) [name], MD Taking Active   polyethylene glycol (MIRALAX / GLYCOLAX) 17 g packet [id number] No Take 17 g by mouth daily. [provider] Taking Active   potassium chloride SA (KLOR-CON) 20 MEQ tablet [id number] No Take by mouth. [provider] Taking Active   ranolazine (RANEXA) 500 MG 12 hr tablet [id number] No Take 1 tablet (500 mg total) by mouth 2 (two) times daily. [name], Kirsten, MD Taking Active   rosuvastatin (CRESTOR) 20 MG tablet [id number] No Take by mouth. [provider] Taking Active   sertraline (ZOLOFT) 25 MG tablet [id number] No TAKE 1 TABLET(25 MG) BY MOUTH DAILY [name], MD Taking Active   spironolactone (ALDACTONE) 25 MG tablet [id number] No Take 12.5 mg by mouth daily. [provider] Taking Active   SYMBICORT 160-4.5 MCG/ACT inhaler [id number] No Inhale 2 puffs into the lungs 2 (two) times daily. [name], MD Taking Active   tamsulosin [facility]) 0.4 MG CAPS capsule [id number] No TAKE 1 CAPSULE (.4MG TOTAL) BY MOUTH DAILY.(GREEN AND TAN CAP WITH TAM 0.4MG) [name], MD Taking Active   traZODone (DESYREL) 150 MG tablet  [id number] No Take 1 tablet (150 mg total) by mouth at bedtime. [name], MD Taking Active             Patient Active Problem List   Diagnosis Date Noted   Coronary artery disease involving native coronary artery of native heart without angina pectoris 12/27/2020   Stage 3b chronic kidney disease ([place]) 12/27/2020   Prostatitis 12/04/2020   Morbid obesity ([address]) 11/21/2020   Depression, major, single episode, moderate ([place]) 07/11/2020   Coronary artery disease involving native heart 02/08/2020   HFrEF (heart failure with reduced ejection fraction) ([place]) 02/08/2020   Acute combined systolic and diastolic CHF, NYHA class 2 ([place]) 01/22/2020   ED (erectile dysfunction) 12/06/2019   Diabetic polyneuropathy ([place]) 12/06/2019   Chronic pain 10/19/2019   Obstructive chronic bronchitis with exacerbation ([place]) 10/03/2019   Mixed hyperlipidemia 06/18/2017   Alcohol abuse    Opiate abuse, continuous ([place])    Polysubstance abuse ([place])    Atherosclerosis of coronary artery of native heart with stable angina pectoris, unspecified vessel or lesion type ([place]) 01/23/2016   Essential hypertension 01/23/2016   Tobacco dependence 01/23/2016   Tobacco use disorder 01/23/2016   BMI 45.0-49.9, adult ([place]) 01/13/2013   Knee pain, left 02/12/2012   Essential thrombocythemia ([place]) 09/29/2011   Pneumonia due to infectious organism 09/04/2011    Immunization History  Administered Date(s) Administered   Influenza Inj Mdck Quad Pf 06/03/2020   Influenza-Unspecified 05/07/2019   PFIZER(Purple Top)SARS-COV-2 Vaccination 10/26/2019, 11/24/2019, 07/13/2020   Pneumococcal-Unspecified 05/07/2019   Zoster Recombinat (Shingrix) 05/07/2019    Conditions to be addressed/monitored:  Hypertension, Hyperlipidemia, Diabetes, Heart Failure, Coronary Artery Disease, Tobacco use, and ***  There are no care plans that you recently modified to display for this patient.    Medication Assistance:  Application for ***  medication assistance program. in process.  Anticipated assistance start date ***.  See plan of care for additional detail.  Compliance/Adherence/Medication fill history: Care Gaps: Last annual wellness ASNKN?39/76/73 If applicable: Last eye exam / retinopathy screening? 11/12/20 Last diabetic foot exam? 09/27/20  Star Rating Drugs:  rosuvastatin  20 MG - 31 DS last filled 02/03/21 metFORMIN 500 MG - 31 DS last filled 02/03/21  Patient's preferred pharmacy is:  [place], [place] [address] Phone: [phone] Fax: [place], [place] [address] Phone: [phone] Fax: [place] [place] [address] Phone: [phone] Fax: [phone]  Uses pill box? Yes Pt endorses ***% compliance  We discussed: Current pharmacy is preferred with insurance plan and patient is satisfied with pharmacy services Patient decided to: Continue current medication management strategy  Care Plan and Follow Up Patient Decision:  Patient agrees to Care Plan and Follow-up.  Plan: Telephone follow up appointment with care management team member scheduled for:  ***  ***  Current Barriers:  Unable to achieve control of ***   Pharmacist Clinical Goal(s):  Patient will achieve control of *** as evidenced by *** through collaboration with PharmD and provider.   Interventions: 1:1 collaboration with [name], MD regarding development and update of comprehensive plan of care as evidenced by provider attestation and co-signature Inter-disciplinary care team collaboration (see longitudinal plan of care) Comprehensive medication review performed; medication list updated in electronic medical  record  Hypertension (BP goal <130/80) -{US controlled/uncontrolled:25276} -Current treatment: Amlodipine 5 mg daily  Metoprolol succinate 25 mg daily  Spironolactone 25 mg 1/2 tablet daily  -Medications previously tried: ***  -Current home readings: *** -Current dietary habits: *** -Current exercise habits: *** -{ACTIONS;DENIES/REPORTS:21021675::"Denies"} hypotensive/hypertensive symptoms -Educated on {CCM BP Counseling:25124} -Counseled to monitor BP at home ***, document, and provide log at future appointments -{CCMPHARMDINTERVENTION:25122}  Hyperlipidemia: (LDL goal < ***) -{US controlled/uncontrolled:25276} -Current treatment: ***aspirin ec 81 mg daily  Zetia 10 mg daily  Rosuvastatin 20 mg *** Nitroglycerin 0.4 mg sl every 5 minutes for chest pain prn  -Medications previously tried: ***  -Current dietary patterns: *** -Current exercise habits: *** -Educated on {CCM HLD Counseling:25126} -{CCMPHARMDINTERVENTION:25122}  Diabetes (A1c goal {A1c goals:23924}) -{US controlled/uncontrolled:25276} -Current medications: ***glucagon 1 mg into the muscle as needed for blood sugar <70  Lantus 100 units/ml 20 units into the skin daily  Metformin 500 mg bid with a meal -Medications previously tried: ***  -Current home glucose readings fasting glucose: *** post prandial glucose: *** -{ACTIONS;DENIES/REPORTS:21021675::"Denies"} hypoglycemic/hyperglycemic symptoms -Current meal patterns:  breakfast: ***  lunch: ***  dinner: *** snacks: *** drinks: *** -Current exercise: *** -Educated on {CCM DM COUNSELING:25123} -Counseled to check feet daily and get yearly eye exams -{CCMPHARMDINTERVENTION:25122}  Heart Failure (Goal: manage symptoms and prevent exacerbations) -{US controlled/uncontrolled:25276} -Last ejection fraction: *** (Date: ***) -HF type: {type of heart failure:30421350} -NYHA Class: {CHL HP Upstream Pharm NYHA [phone]} -AHA HF Stage: {CHL HP Upstream  Pharm AHA HF [phone]} -Current treatment: Entresto 24-26 mg *** Amlodipine 5 mg daily  Isosorbide mn 60 mg daily  Metoprolol succinate 25 mg daily  Ranolazine 500 mg bid  Spironolactone 25 mg 1/2 tablet daily  Potassium 20 meq *** -Medications previously tried: ***  -Current home BP/HR readings: *** -Current dietary habits: *** -Current exercise habits: *** -Educated on {CCM HF Counseling:25125} -{CCMPHARMDINTERVENTION:25122}  COPD (Goal: control symptoms and  prevent exacerbations) -{US controlled/uncontrolled:25276} -Current treatment  ***Symbicort 160-4.5 mcg/act 2 puffs bid  Albuterol 2 puffs every 4 hours prn wheezing or shortness of breath -Medications previously tried: ***  -Gold Grade: {CHL HP Upstream Pharm COPD Gold DHWYS:[id number]} -Current COPD Classification:  {CHL HP Upstream Pharm COPD Classification:5203065823} -MMRC/CAT score: *** -Pulmonary function testing: *** -Exacerbations requiring treatment in last 6 months: *** -Patient {Actions; denies-reports:120008} consistent use of maintenance inhaler -Frequency of rescue inhaler use: *** -Counseled on {CCMINHALERCOUNSELING:25121} -{CCMPHARMDINTERVENTION:25122}   Tobacco use (Goal ***) -{US controlled/uncontrolled:25276} -Previous quit attempts: *** -Current treatment  ***nicotine 14 mg/24 hour patch -Patient smokes {Time to first cigarette:23873} -Patient triggers include: {Smoking Triggers:23882} -On a scale of 1-10, reports MOTIVATION to quit is *** -On a scale of 1-10, reports CONFIDENCE in quitting is *** -{Smoking Cessation Counseling:23883} -{CCMPHARMDINTERVENTION:25122}  Depression/Anxiety (Goal: ***) -{US controlled/uncontrolled:25276} -Current treatment: ***sertraline 25 mg daily  Trazodone 150 mg daily at bedtime  -Medications previously tried/failed: *** -PHQ9: *** -GAD7: *** -Connected with *** for mental health support -Educated on {CCM mental health  counseling:25127} -{CCMPHARMDINTERVENTION:25122}  *** (Goal: ***) -{US controlled/uncontrolled:25276} -Current treatment  *** Pantoprazole 40 mg daily  Miralax 17 grams daily  -Medications previously tried: ***  -{CCMPHARMDINTERVENTION:25122}  BPH (Goal: ***) -{US controlled/uncontrolled:25276} -Current treatment  Tamsulosin 0.4 mg daily  -Medications previously tried: ***  -{CCMPHARMDINTERVENTION:25122}   *** (Goal: ***) -{US controlled/uncontrolled:25276} -Current treatment  ***colchicine-probenecid 0.5-500 mg bid prn  -Medications previously tried: ***  -{CCMPHARMDINTERVENTION:25122}  *** (Goal: ***) -{US controlled/uncontrolled:25276} -Current treatment  Acetaminophen 325 mg 2 tablets every 4 hours prn mild pain  Meloxicam 15 mg *** Gabapentin 300 mg tid  Voltaren gel 2 grams qid -Medications previously tried: ***  -{CCMPHARMDINTERVENTION:25122}   Health Maintenance -Vaccine gaps: *** -Current therapy:  ***meclizine 12.5 mg tid prn dizziness -Educated on {ccm supplement counseling:25128} -{CCM Patient satisfied:25129} -{CCMPHARMDINTERVENTION:25122}  Patient Goals/Self-Care Activities Patient will:  - {pharmacypatientgoals:24919}  Follow Up Plan: {CM FOLLOW UP JDBZ:20802}

## 2021-03-04 NOTE — Chronic Care Management (AMB) (Signed)
    Chronic Care Management Pharmacy Assistant   Name: KARLO STOCKDALE  MRN: [id number] DOB: 1960/08/17  Initial CCM Office  Visit rescheduled for 03/25/21 at 10:45 am

## 2021-03-05 ENCOUNTER — Ambulatory Visit: Payer: Medicare Other

## 2021-03-11 ENCOUNTER — Other Ambulatory Visit: Payer: Self-pay | Admitting: Legal Medicine

## 2021-03-20 ENCOUNTER — Telehealth: Payer: Self-pay

## 2021-03-20 NOTE — Progress Notes (Signed)
1st attempt at reaching pt to reschedule initial visit. LVM to return my call DG

## 2021-03-20 NOTE — Chronic Care Management (AMB) (Signed)
Chronic Care Management Pharmacy Assistant   Name: CANAAN HOLZER  MRN: [id number] DOB: 1960/09/02  Reason for Encounter: Chart Review for CPP visit on 04/04/2021.  03/20/2021- Called patient to reschedule initial visit from 03/25/2021 due to Provider being on vacation, called daughter she wanted me to call patient at mobile number listed [phone] [facility])- number goes straight to a busy signal, tried [phone] [facility] Phone)- number disconnected. Rescheduled initial visit to 04/04/2021 at 11:15 am.    Conditions to be addressed/monitored: CHF, CAD, HTN, HLD, COPD, DMII, and Depression  Recent office visits:  01/24/21- Reinaldo Meeker, MD- seen for chronic conditions,labs ordered, referral to pain clinic, no medication changes, no follow up documented 12/04/20- Reinaldo Meeker, MD- seen for prostatitis, referral to cardiology, referral to pain clinic, no medication changes, no follow up documented 11/21/20- Reinaldo Meeker, MD- seen for chronic pain syndrome,referral to orthopedics, referral to pain clinic, steroid injection administered, no medication changes, follow up 3 months 09/27/20- Reinaldo Meeker, MD- seen for chronic conditions, increased symbicort from 80-4.5 mcg/act to 160-4.5 mcg/act two puffs twice daily, labs ordered, follow up 4 months   Recent consult visits:  01/28/21- [name], DPM (Podiatry)- seen for pain due to onychomycosis of nail, debridement performed, no medication changes, follow up 3 months 01/07/21- Carlyon Shadow, PA-C (Orthopedic Surgery)- seen for acute/ chronic left knee pain, x- ray performed, instructed to begin outpatient PT, no medication changes, follow up as directed 12/27/20- Kardie Tobb, DO (Cardiology)- seen for dizziness, labs performed, zio monitor ordered, no medication changes, follow up 6 months 12/16/2020- Cardiac Catheterization 09/06/20- [name], DPM (Podiatry)- seen for pain due to onychomycosis of nail, debridement performed,  no medication changes, follow up 3 months  Hospital visits:  Medication Reconciliation was completed by comparing discharge summary, patient's EMR and Pharmacy list, and upon discussion with patient.  Admitted to the hospital on 11/29/2020 due to Chest Pains. Discharge date was 11/29/2020. Discharged from [place]?Medications Started at [facility] Discharge:?? -started: None  Medication Changes at Hospital Discharge: -Changed: None  Medications Discontinued at Hospital Discharge: -Stopped: None  Medications that remain the same after Hospital Discharge:??  -All other medications will remain the same.     Lab Results  Component Value Date/Time   HGBA1C 7.6 (H) 01/24/2021 09:19 AM   HGBA1C 6.2 (H) 09/27/2020 09:08 AM   MICROALBUR 80 09/27/2020 10:46 AM     BP Readings from Last 3 Encounters:  01/24/21 (!) 142/90  12/27/20 116/78  12/04/20 (!) 150/64   Initial questions copied from previous chart review completed on 02/20/2021.  Have you seen any other providers since your last visit with PCP?  Patient stated he has not seen any other providers but he will be seen an orthopedist 08/26 for left knee pain   Any changes in your medications or health?  Patient stated he has had no changes in his medications   Any side effects from any medications?  Patient stated he occasionally experiences cramping after taking medications but is unsure which medication is the cause for this   Do you have an symptoms or problems not managed by your medications?  Patient stated his only unmanaged symptom is from cramping   Any concerns about your health right now?  Patient stated he is concerned about his ability to maintain his health on his own   Has your provider asked that you check blood pressure, blood sugar, or follow special diet at home?  Patient stated he  uses his arm monitor to check his BP and it usually runs no higher than 140/90.  Patient stated he does not own a  BS monitor to check his BS at home Patient stated he does not have a specific diet and eats whenever he gets hungry. He stated that he does not have an appetite often and rarely eats three meals a day.   Do you get any type of exercise on a regular basis?  Patient stated he is mostly sedentary due to his knee, back, and hip pain. He mainly watches TV all day and ambulates with a cane and walker when needed.   Can you think of a goal you would like to reach for your health?  Patient could not think of any goals at the moment   Do you have any problems getting your medications?  Patient stated he does not have problems getting his medications, but does have problems with adherence. He stated "it is really hard to keep up with all of them". Patient indicated that his medications are pre packaged in "bubble wrap"   Is there anything that you would like to discuss during the appointment?  Patient stated he would like to discuss transitioning into a nursing facility. He currently lives with his daughter who is a Furniture conservator/restorer and has 4 kids. He believes that he is currently unable to provide himself with the consistent care that he needs and believes he would benefit from round the clock care. Patient stated that he would like to make this transition as early as possible, even before his next PCP appointment in Nov. If able.    Medications: Outpatient Encounter Medications as of 03/20/2021  Medication Sig   acetaminophen (TYLENOL) 325 MG tablet Take 650 mg by mouth every 4 (four) hours as needed for mild pain.   albuterol (VENTOLIN HFA) 108 (90 Base) MCG/ACT inhaler INHALE 2 PUFFS INTO THE LUNGS EVERY 4 HOURS AS NEEDED FOR WHEEZING OR SHORTNESS OF BREATH.   amLODipine (NORVASC) 5 MG tablet Take 5 mg by mouth daily.   colchicine-probenecid 0.5-500 MG tablet Take 1 tablet by mouth 2 (two) times daily as needed.   diclofenac Sodium (VOLTAREN) 1 % GEL Apply 2 g topically 4 (four) times daily.    ENTRESTO 24-26 MG Take by mouth.   ezetimibe (ZETIA) 10 MG tablet Take 10 mg by mouth daily.   gabapentin (NEURONTIN) 300 MG capsule Take 300 mg by mouth 3 (three) times daily.   Glucagon, rDNA, (GLUCAGON EMERGENCY) 1 MG KIT Inject 1 mg into the muscle as needed (BG less than 70).   GOODSENSE ASPIRIN LOW DOSE 81 MG EC tablet TAKE 1 TABLET ($RemoveB'81MG'JUNXrJuB$ ) BY MOUTH DAILY. SWALLOW WHOLE.(YELLOW ROUND TAB WITH HEART)   indomethacin (INDOCIN) 25 MG capsule TAKE 1 CAPSULE ($RemoveBe'25MG'AzvAYOvqa$  TOTAL) BY MOUTH 3 TIMES DAILY. (GREEN CAPSULE WITH G406)   insulin glargine (LANTUS) 100 UNIT/ML injection Inject 20 Units into the skin daily.   isosorbide mononitrate (IMDUR) 60 MG 24 hr tablet Take 60 mg by mouth daily.   meclizine (ANTIVERT) 12.5 MG tablet Take 12.5 mg by mouth 3 (three) times daily as needed for dizziness.   meloxicam (MOBIC) 15 MG tablet Take by mouth.   metFORMIN (GLUCOPHAGE) 500 MG tablet Take 500 mg by mouth 2 (two) times daily with a meal.   metoprolol succinate (TOPROL-XL) 25 MG 24 hr tablet TAKE 1 TABLET (25 MG TOTAL) BY MOUTH DAILY.(OVAL WHITE TAB WITH 564)   nicotine (NICODERM CQ - DOSED IN  MG/24 HOURS) 14 mg/24hr patch Place 14 mg onto the skin daily.   nitroGLYCERIN (NITROSTAT) 0.4 MG SL tablet Place 1 tablet (0.4 mg total) under the tongue every 5 (five) minutes as needed. For chest pain   pantoprazole (PROTONIX) 40 MG tablet TAKE 1 TABLET($RemoveBefor'40MG'sVYsxivmyCPR$ ) BY MOUTH DAILY.(OVALYELLOW TAB WITH H126)   polyethylene glycol (MIRALAX / GLYCOLAX) 17 g packet Take 17 g by mouth daily.   potassium chloride SA (KLOR-CON) 20 MEQ tablet Take by mouth.   ranolazine (RANEXA) 500 MG 12 hr tablet Take 1 tablet (500 mg total) by mouth 2 (two) times daily.   rosuvastatin (CRESTOR) 20 MG tablet Take by mouth.   sertraline (ZOLOFT) 25 MG tablet Take 1 tablet (25 mg total) by mouth daily.   spironolactone (ALDACTONE) 25 MG tablet TAKE 1/2 TABLET(12.5) BY MOUTH DAILY. (HALF TAN TAB)   SYMBICORT 160-4.5 MCG/ACT inhaler Inhale 2 puffs  into the lungs 2 (two) times daily.   tamsulosin (FLOMAX) 0.4 MG CAPS capsule TAKE 1 CAPSULE (.$RemoveBef'4MG'SRwcBugzAM$  TOTAL) BY MOUTH DAILY.(GREEN AND TAN CAP WITH TAM 0.$RemoveBe'4MG'AnhxwnpvW$ )   traZODone (DESYREL) 150 MG tablet Take 1 tablet (150 mg total) by mouth at bedtime.   No facility-administered encounter medications on file as of 03/20/2021.    Care Gaps: Last annual wellness visit?06/03/20 Last eye exam / retinopathy screening? 11/12/20 Last diabetic foot exam? 09/27/20 HIV Screening- Overdue - never done  Hepatitis C Screening (Once)- never done  TETANUS/TDAP (Every 10 Years)-   Jul 02 2019 Zoster Vaccines- Shingrix (2 of 2)- Last completed: May 07, 2019 COVID-19 Vaccine (4 - Booster for [employer] series)- Last completed: Jul 13, 2020 INFLUENZA VACCINE (Every 8 Months, August to March)- Last completed: Jun 03, 2020   Star Rating Drugs: Rosuvastatin  20 MG - Last filled 03/06/2021 for 30 DS from [place]  MetFORMIN 500 MG - Last filled 03/06/2021 for 30 DS from [place], [place] Pharmacist Assistant [phone]

## 2021-03-25 ENCOUNTER — Ambulatory Visit: Payer: Medicare Other

## 2021-04-02 DIAGNOSIS — M25562 Pain in left knee: Secondary | ICD-10-CM | POA: Diagnosis not present

## 2021-04-02 DIAGNOSIS — M7062 Trochanteric bursitis, left hip: Secondary | ICD-10-CM | POA: Diagnosis not present

## 2021-04-02 DIAGNOSIS — Z96652 Presence of left artificial knee joint: Secondary | ICD-10-CM | POA: Diagnosis not present

## 2021-04-04 ENCOUNTER — Other Ambulatory Visit: Payer: Self-pay

## 2021-04-04 ENCOUNTER — Ambulatory Visit (INDEPENDENT_AMBULATORY_CARE_PROVIDER_SITE_OTHER): Payer: Medicare Other

## 2021-04-04 DIAGNOSIS — I10 Essential (primary) hypertension: Secondary | ICD-10-CM

## 2021-04-04 DIAGNOSIS — E1142 Type 2 diabetes mellitus with diabetic polyneuropathy: Secondary | ICD-10-CM | POA: Diagnosis not present

## 2021-04-04 DIAGNOSIS — E782 Mixed hyperlipidemia: Secondary | ICD-10-CM | POA: Diagnosis not present

## 2021-04-04 DIAGNOSIS — I25118 Atherosclerotic heart disease of native coronary artery with other forms of angina pectoris: Secondary | ICD-10-CM | POA: Diagnosis not present
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
## 2021-04-08 ENCOUNTER — Other Ambulatory Visit: Payer: Self-pay | Admitting: Family Medicine

## 2021-04-08 ENCOUNTER — Other Ambulatory Visit: Payer: Self-pay | Admitting: Legal Medicine

## 2021-04-08 DIAGNOSIS — G8929 Other chronic pain: Secondary | ICD-10-CM | POA: Diagnosis not present

## 2021-04-08 DIAGNOSIS — I2510 Atherosclerotic heart disease of native coronary artery without angina pectoris: Secondary | ICD-10-CM

## 2021-04-08 DIAGNOSIS — Z79899 Other long term (current) drug therapy: Secondary | ICD-10-CM | POA: Diagnosis not present

## 2021-04-08 DIAGNOSIS — M25561 Pain in right knee: Secondary | ICD-10-CM | POA: Diagnosis not present

## 2021-04-08 DIAGNOSIS — M25562 Pain in left knee: Secondary | ICD-10-CM | POA: Diagnosis not present

## 2021-04-08 DIAGNOSIS — K2100 Gastro-esophageal reflux disease with esophagitis, without bleeding: Secondary | ICD-10-CM

## 2021-04-08 NOTE — Telephone Encounter (Signed)
Refill sent to pharmacy.   

## 2021-04-10 DIAGNOSIS — Z79899 Other long term (current) drug therapy: Secondary | ICD-10-CM | POA: Diagnosis not present

## 2021-04-14 ENCOUNTER — Other Ambulatory Visit: Payer: Self-pay | Admitting: Legal Medicine

## 2021-04-15 NOTE — Telephone Encounter (Signed)
Attempted to call primary phone number, Daughter. No answer, left VM for call back.   Royce Macadamia, [place] 04/15/21 8:46 AM

## 2021-04-16 ENCOUNTER — Other Ambulatory Visit: Payer: Self-pay | Admitting: Family Medicine

## 2021-04-16 MED ORDER — NICOTINE 7 MG/24HR TD PT24: 7.0000 mg | MEDICATED_PATCH | Freq: Every day | TRANSDERMAL | 0 refills | Status: DC

## 2021-04-23 DIAGNOSIS — G4733 Obstructive sleep apnea (adult) (pediatric): Secondary | ICD-10-CM | POA: Diagnosis not present

## 2021-04-30 ENCOUNTER — Ambulatory Visit: Payer: Medicare Other | Admitting: Sports Medicine

## 2021-05-01 ENCOUNTER — Ambulatory Visit (INDEPENDENT_AMBULATORY_CARE_PROVIDER_SITE_OTHER): Payer: Medicare Other
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
## 2021-05-05 DIAGNOSIS — I10 Essential (primary) hypertension: Secondary | ICD-10-CM | POA: Diagnosis not present

## 2021-05-05 DIAGNOSIS — E1142 Type 2 diabetes mellitus with diabetic polyneuropathy: Secondary | ICD-10-CM

## 2021-05-05 DIAGNOSIS — E782 Mixed hyperlipidemia: Secondary | ICD-10-CM | POA: Diagnosis not present

## 2021-05-05 DIAGNOSIS — I25118 Atherosclerotic heart disease of native coronary artery with other forms of angina pectoris: Secondary | ICD-10-CM | POA: Diagnosis not present

## 2021-05-06 ENCOUNTER — Other Ambulatory Visit: Payer: Self-pay | Admitting: Legal Medicine

## 2021-05-06 ENCOUNTER — Other Ambulatory Visit: Payer: Self-pay | Admitting: Family Medicine

## 2021-05-13 DIAGNOSIS — M25562 Pain in left knee: Secondary | ICD-10-CM | POA: Diagnosis not present

## 2021-05-13 DIAGNOSIS — M25561 Pain in right knee: Secondary | ICD-10-CM | POA: Diagnosis not present

## 2021-05-13 DIAGNOSIS — Z79899 Other long term (current) drug therapy: Secondary | ICD-10-CM | POA: Diagnosis not present

## 2021-05-13 DIAGNOSIS — G8929 Other chronic pain: Secondary | ICD-10-CM | POA: Diagnosis not present

## 2021-05-27 ENCOUNTER — Other Ambulatory Visit: Payer: Self-pay

## 2021-05-27 ENCOUNTER — Encounter: Payer: Self-pay | Admitting: Legal Medicine

## 2021-05-27 ENCOUNTER — Ambulatory Visit (INDEPENDENT_AMBULATORY_CARE_PROVIDER_SITE_OTHER): Payer: Medicare Other | Admitting: Legal Medicine

## 2021-05-27 VITALS — BP 120/70 | HR 90 | Temp 97.4°F | Resp 16 | Ht 68.0 in | Wt 290.0 lb

## 2021-05-27 DIAGNOSIS — I10 Essential (primary) hypertension: Secondary | ICD-10-CM

## 2021-05-27 DIAGNOSIS — I25118 Atherosclerotic heart disease of native coronary artery with other forms of angina pectoris: Secondary | ICD-10-CM

## 2021-05-27 DIAGNOSIS — F1910 Other psychoactive substance abuse, uncomplicated: Secondary | ICD-10-CM

## 2021-05-27 DIAGNOSIS — I5041 Acute combined systolic (congestive) and diastolic (congestive) heart failure: Secondary | ICD-10-CM

## 2021-05-27 DIAGNOSIS — E782 Mixed hyperlipidemia: Secondary | ICD-10-CM

## 2021-05-27 DIAGNOSIS — F1110 Opioid abuse, uncomplicated: Secondary | ICD-10-CM | POA: Diagnosis not present

## 2021-05-27 DIAGNOSIS — F17200 Nicotine dependence, unspecified, uncomplicated: Secondary | ICD-10-CM | POA: Diagnosis not present

## 2021-05-27 DIAGNOSIS — I2510 Atherosclerotic heart disease of native coronary artery without angina pectoris: Secondary | ICD-10-CM | POA: Diagnosis not present

## 2021-05-27 DIAGNOSIS — Z23 Encounter for immunization: Secondary | ICD-10-CM

## 2021-05-27 DIAGNOSIS — E1142 Type 2 diabetes mellitus with diabetic polyneuropathy: Secondary | ICD-10-CM

## 2021-05-27 MED ORDER — CELECOXIB 200 MG PO CAPS
200.0000 mg | ORAL_CAPSULE | Freq: Two times a day (BID) | ORAL | 5 refills | Status: DC
Start: 2021-05-27 — End: 2021-10-06

## 2021-05-27 NOTE — Progress Notes (Addendum)
Subjective:  Patient ID: Brent Ortiz, male    DOB: 02-02-1961  Age: 60 y.o. MRN: [id number]  Chief Complaint  Patient presents with   Hypertension   Hyperlipidemia   Diabetes    HPI: chronic visit  Patient kicked out of pain clinic for taking both oxycodone and hydrocodone when he was only on oxycodone.  Patient presents for follow up of hypertension.  Patient tolerating amlodipine well with side effects.  Patient was diagnosed with hypertension 2010 so has been treated for hypertension for 12 years.Patient is working on maintaining diet and exercise regimen and follows up as directed. Complication include cad  CORONARY ARTERY DISEASE  Patient presents in follow up of CAD. Patient was diagnosed in 2020. The patient has no associated CHF. The patient is currently taking a beta blocker, statin, and aspirin. CAD was diagnosed 2 years ago.  Patient is having no angina. Patient has used no NTG.  Patient is followed by cardiology.  Patient had none . Last angiography was 2020, last echocardiogram na. . December 17, 2020 showed chronic circumflex occlusion, mild LAD disease, his antianginal was optimized, heart failure with midrange ejection fraction which is now improved to 55 to 60% on his most recent echocardiogram  Patient present with type 2 diabetes.  Specifically, this is type 2, insulin requiring diabetes, complicated by polyneuropathy.  Compliance with treatment has been good; patient take medicines as directed, maintains diet and exercise regimen, follows up as directed, and is keeping glucose diary.  Date of  diagnosis 2010.  Depression screen has been performed.Tobacco screen smoker. Current medicines for diabetes lantus 30units qd.metformin  Patient is on valsartan for renal protection and crestor for cholesterol control.  Patient performs foot exams daily and last ophthalmologic exam was had eye exam.    Current Outpatient Medications on File Prior to Visit  Medication Sig Dispense  Refill   acetaminophen (TYLENOL) 325 MG tablet Take 650 mg by mouth every 4 (four) hours as needed for mild pain.     albuterol (VENTOLIN HFA) 108 (90 Base) MCG/ACT inhaler INHALE 2 PUFFS INTO THE LUNGS EVERY 4 HOURS AS NEEDED FOR WHEEZING OR SHORTNESS OF BREATH. 8.5 g 6   amLODipine (NORVASC) 5 MG tablet Take 5 mg by mouth daily.     B-D ULTRAFINE III SHORT PEN 31G X 8 MM MISC SMARTSIG:Injection Daily     colchicine-probenecid 0.5-500 MG tablet Take 1 tablet by mouth 2 (two) times daily as needed.     diclofenac Sodium (VOLTAREN) 1 % GEL Apply 2 g topically 4 (four) times daily.     ezetimibe (ZETIA) 10 MG tablet Take 10 mg by mouth daily.     gabapentin (NEURONTIN) 300 MG capsule TAKE 1 CAPSULE ($RemoveBe'300MG'KsXQkFTia$ ) BY MOUTH TWICE DAILY.(YELLOW CAP) 186 capsule 3   Glucagon, rDNA, (GLUCAGON EMERGENCY) 1 MG KIT Inject 1 mg into the muscle as needed (BG less than 70).     GOODSENSE ASPIRIN LOW DOSE 81 MG EC tablet TAKE 1 TABLET ($RemoveB'81MG'XjAznGiH$ ) BY MOUTH DAILY. SWALLOW WHOLE.(YELLOW ROUND TAB WITH HEART) 90 tablet 2   isosorbide mononitrate (IMDUR) 60 MG 24 hr tablet Take 60 mg by mouth daily.     LANTUS SOLOSTAR 100 UNIT/ML Solostar Pen SMARTSIG:30 Unit(s) SUB-Q Daily     meclizine (ANTIVERT) 12.5 MG tablet Take 12.5 mg by mouth 3 (three) times daily as needed for dizziness.     metFORMIN (GLUCOPHAGE) 500 MG tablet Take 500 mg by mouth 2 (two) times daily with a meal.  metoprolol succinate (TOPROL-XL) 25 MG 24 hr tablet TAKE 1 TABLET (25 MG TOTAL) BY MOUTH DAILY.(OVAL WHITE TAB WITH E7) 31 tablet 3   nicotine (NICODERM CQ - DOSED IN MG/24 HOURS) 21 mg/24hr patch 21 mg daily.     nicotine (NICODERM CQ - DOSED IN MG/24 HR) 7 mg/24hr patch APPLY 1 PATCH ONTO THE SKIN ONCE DAILY ** DISCARD PROPERLY 30 patch 3   nitroGLYCERIN (NITROSTAT) 0.4 MG SL tablet Place 1 tablet (0.4 mg total) under the tongue every 5 (five) minutes as needed. For chest pain 50 tablet 3   pantoprazole (PROTONIX) 40 MG tablet TAKE 1 TABLET($RemoveBefor'40MG'NqIVHwceQOve$ ) BY  MOUTH DAILY.(OVALYELLOW TAB WITH H126) 90 tablet 2   polyethylene glycol (MIRALAX / GLYCOLAX) 17 g packet Take 17 g by mouth daily.     potassium chloride SA (KLOR-CON) 20 MEQ tablet TAKE 1 TABLET(20MEQ TOTAL) BY MOUTH 4 TIMES DAILY.(OBLONG WHITE TAB WITH KC M20) 120 tablet 2   ranolazine (RANEXA) 500 MG 12 hr tablet TAKE 1 TABLET BY MOUTH 2 TIMES DAILY (PEACH COLORED TABLET WITH C49 OR 588) 186 tablet 0   rosuvastatin (CRESTOR) 20 MG tablet Take by mouth.     sacubitril-valsartan (ENTRESTO) 24-26 MG Take 1 tablet by mouth 2 (two) times daily. 60 tablet 2   sertraline (ZOLOFT) 25 MG tablet Take 1 tablet (25 mg total) by mouth daily. 90 tablet 2   spironolactone (ALDACTONE) 25 MG tablet TAKE 1/2 TABLET(12.5) BY MOUTH DAILY. (HALF TAN TAB) 30 tablet 1   SYMBICORT 160-4.5 MCG/ACT inhaler Inhale 2 puffs into the lungs 2 (two) times daily. 30.8 g 2   tamsulosin (FLOMAX) 0.4 MG CAPS capsule TAKE 1 CAPSULE (.$RemoveBef'4MG'npjjanecVk$  TOTAL) BY MOUTH DAILY.(GREEN AND TAN CAP WITH TAM 0.$RemoveBe'4MG'MDFcuRexh$ ) 90 capsule 2   traZODone (DESYREL) 150 MG tablet Take 1 tablet (150 mg total) by mouth at bedtime. 90 tablet 2   No current facility-administered medications on file prior to visit.   Past Medical History:  Diagnosis Date   Acute combined systolic and diastolic CHF, NYHA class 2 ([place]) 01/22/2020   Alcohol abuse    Atherosclerosis of coronary artery of native heart with stable angina pectoris, unspecified vessel or lesion type ([place]) 01/23/2016   Overview:  Completely occluded circumflex artery proximal to obtuse marginal 1 basin cardiac catheter from 2017   Chronic kidney disease    Chronic pain 10/19/2019   Sees pain clinic   COPD (chronic obstructive pulmonary disease) ([place])    Coronary artery disease involving native heart 02/08/2020   Coronary atherosclerosis of native coronary artery 01/23/2016   Overview:  Completely occluded circumflex artery proximal to obtuse marginal 1 basin cardiac catheter from 2017   Depression, major,  single episode, moderate ([place]) 07/11/2020   Diabetic polyneuropathy ([place]) 12/06/2019   ED (erectile dysfunction) 12/06/2019   Essential hypertension 01/23/2016   Essential thrombocythemia ([place]) 09/29/2011   GERD without esophagitis    HFrEF (heart failure with reduced ejection fraction) ([place]) 02/08/2020   Knee pain, left 02/12/2012   Formatting of this note might be different from the original. Left   Leukocytosis 09/04/2011   Mixed hyperlipidemia 06/18/2017   Morbid obesity ([place]) 11/21/2020   Morbid obesity with BMI of 40.0-44.9, adult ([place]) 01/13/2013   Myocardial infarction ([place])    04-13-2011   Obstructive chronic bronchitis with exacerbation ([place]) 10/03/2019   Opiate abuse, continuous (HCC)    Osteoarthritis    Pain in knee joint 02/12/2012   Formatting of this note might be different from the original.  Left   Pneumonia    Pneumonia due to infectious organism 09/04/2011   Polysubstance abuse ([place])    Poor dentition    Prostatitis 12/04/2020   Thrombocytosis 09/29/2011   Tobacco dependence 01/23/2016   Type 2 diabetes mellitus with diabetic polyneuropathy [facility])    Past Surgical History:  Procedure Laterality Date   HERNIA REPAIR  2009   I & D KNEE WITH POLY EXCHANGE  09/03/2011   Procedure: IRRIGATION AND DEBRIDEMENT KNEE WITH POLY EXCHANGE;  Surgeon: Mauri Pole, MD;  Location: [facility];  Service: Orthopedics;  Laterality: Left;   TOOTH EXTRACTION Bilateral 08/06/2017   Procedure: DENTAL RESTORATION/EXTRACTIONS;  Surgeon: Diona Browner, DDS;  Location: [place];  Service: Oral Surgery;  Laterality: Bilateral;   TOTAL KNEE ARTHROPLASTY   right  feb 2012   left march 2012 r   TOTAL KNEE REVISION  06/23/2011   Procedure: TOTAL KNEE REVISION;  Surgeon: Mauri Pole;  Location: [facility];  Service: Orthopedics;  Laterality: Left;  femomal nerve block done in holding area without incident    Family History  Problem Relation Age of Onset   Colon cancer Other    Colon cancer Mother     Social History   Socioeconomic History   Marital status: Widowed    Spouse name: Not on file   Number of children: 5   Years of education: Not on file   Highest education level: Not on file  Occupational History   Occupation: Disabled  Tobacco Use   Smoking status: Every Day    Packs/day: 1.00    Years: 50.00    Pack years: 50.00    Types: Cigarettes   Smokeless tobacco: Never  Vaping Use   Vaping Use: Former  Substance and Sexual Activity   Alcohol use: No    Comment: heavy drinker in past    Drug use: No   Sexual activity: Not Currently  Other Topics Concern   Not on file  Social History Narrative   Not on file   Social Determinants of Health   Financial Resource Strain: Low Risk    Difficulty of Paying Living Expenses: Not very hard  Food Insecurity: No Food Insecurity   Worried About Charity fundraiser in the Last Year: Never true   [place] in the Last Year: Never true  Transportation Needs: No Transportation Needs   Lack of Transportation (Medical): No   Lack of Transportation (Non-Medical): No  Physical Activity: Inactive   Days of Exercise per Week: 0 days   Minutes of Exercise per Session: 0 min  Stress: No Stress Concern Present   Feeling of Stress : Not at all  Social Connections: Moderately Integrated   Frequency of Communication with Friends and Family: Twice a week   Frequency of Social Gatherings with Friends and Family: Once a week   Attends Religious Services: More than 4 times per year   Active Member of [employer] or Organizations: Yes   Attends Archivist Meetings: More than 4 times per year   Marital Status: Widowed    Review of Systems  Constitutional:  Negative for chills, fatigue, fever and unexpected weight change.  HENT:  Negative for congestion, ear pain, sinus pain and sore throat.   Respiratory:  Negative for cough and shortness of breath.   Cardiovascular:  Negative for chest pain and palpitations.   Gastrointestinal:  Negative for abdominal pain, blood in stool, constipation, diarrhea, nausea and vomiting.  Endocrine: Negative for  polydipsia.  Genitourinary:  Negative for dysuria.  Musculoskeletal:  Positive for arthralgias and joint swelling. Negative for back pain.  Skin:  Negative for rash.  Neurological:  Negative for headaches.    Objective:  BP 120/70   Pulse 90   Temp (!) 97.4 F (36.3 C)   Resp 16   Ht $R'5\' 8"'nx$  (1.727 m)   Wt 290 lb (131.5 kg)   SpO2 98%   BMI 44.09 kg/m   BP/Weight 05/27/2021 01/24/2021 2/42/6834  Systolic BP 196 222 979  Diastolic BP 70 90 78  Wt. (Lbs) 290 302 312  BMI 44.09 45.92 47.44    Physical Exam Vitals reviewed.  Constitutional:      Appearance: Normal appearance. He is obese.  HENT:     Right Ear: Tympanic membrane normal.     Left Ear: Tympanic membrane normal.     Mouth/Throat:     Mouth: Mucous membranes are moist.  Eyes:     Extraocular Movements: Extraocular movements intact.     Conjunctiva/sclera: Conjunctivae normal.     Pupils: Pupils are equal, round, and reactive to light.  Cardiovascular:     Rate and Rhythm: Normal rate and regular rhythm.     Pulses: Normal pulses.     Heart sounds: Normal heart sounds. No murmur heard.   No gallop.  Pulmonary:     Effort: Pulmonary effort is normal. No respiratory distress.     Breath sounds: No wheezing.  Abdominal:     General: Abdomen is flat. Bowel sounds are normal. There is no distension.     Palpations: Abdomen is soft.     Tenderness: There is no abdominal tenderness.  Musculoskeletal:     Cervical back: Normal range of motion and neck supple.       Legs:     Comments: Left 3 replacements, right one TKA  Skin:    General: Skin is warm.     Capillary Refill: Capillary refill takes less than 2 seconds.  Neurological:     General: No focal deficit present.     Mental Status: He is alert and oriented to person, place, and time. Mental status is at baseline.     Diabetic Foot Exam - Simple   Simple Foot Form Diabetic Foot exam was performed with the following findings: Yes 05/27/2021 10:20 AM  Visual Inspection No deformities, no ulcerations, no other skin breakdown bilaterally: Yes Sensation Testing See comments: Yes Pulse Check Posterior Tibialis and Dorsalis pulse intact bilaterally: Yes Comments Numbness feet      Lab Results  Component Value Date   WBC 8.4 01/24/2021   HGB 15.3 01/24/2021   HCT 44.0 01/24/2021   PLT 253 01/24/2021   GLUCOSE 177 (H) 01/24/2021   CHOL 217 (H) 01/24/2021   TRIG 132 01/24/2021   HDL 65 01/24/2021   LDLCALC 129 (H) 01/24/2021   ALT 14 01/24/2021   AST 11 01/24/2021   NA 136 01/24/2021   K 5.0 01/24/2021   CL 101 01/24/2021   CREATININE 1.16 01/24/2021   BUN 16 01/24/2021   CO2 20 01/24/2021   TSH 0.719 01/24/2021   INR 1.81 (H) 09/07/2011   HGBA1C 7.6 (H) 01/24/2021   MICROALBUR 80 09/27/2020      Assessment & Plan:   Problem List Items Addressed This Visit       Cardiovascular and Mediastinum   Atherosclerosis of coronary artery of native heart with stable angina pectoris, unspecified vessel or lesion type ([place])   Relevant  Medications   celecoxib (CELEBREX) 200 MG capsule An individual plan was formulated based on patient history and exam, labs and evidence based data. Patient has not had recent angina or nitroglycerin use. continue present treatment.    Essential hypertension   Relevant Orders   CBC with Differential/Platelet   Comprehensive metabolic panel An individual hypertension care plan was established and reinforced today.  The patient's status was assessed using clinical findings on exam and labs or diagnostic tests. The patient's success at meeting treatment goals on disease specific evidence-based guidelines and found to be fair controlled. SELF MANAGEMENT: The patient and I together assessed ways to personally work towards obtaining the recommended  goals. RECOMMENDATIONS: avoid decongestants found in common cold remedies, decrease consumption of alcohol, perform routine monitoring of BP with home BP cuff, exercise, reduction of dietary salt, take medicines as prescribed, try not to miss doses and quit smoking.  Regular exercise and maintaining a healthy weight is needed.  Stress reduction may help. A CLINICAL SUMMARY including written plan identify barriers to care unique to individual due to social or financial issues.  We attempt to mutually creat solutions for individual and family understanding.    Acute combined systolic and diastolic CHF, NYHA class 2 ([place]) An individualized care plan was established and reinforced.  The patient's disease status was assessed using clinical finding son exam today, labs, and/or other diagnostic testing such as x-rays, to determine the patient's success in meeting treatmentgoalsbased on disease-based guidelines and found to beimproving. But not at goal yet. Medications prescriptions no changes Laboratory tests ordered to be performed today include normal lab. RECOMMENDATIONS: given include see cardiology.  Call physician is patient gains 3 lbs in one day or 5 lbs for one week.  Call for progressive PND, orthopnea or increased pedal edema.    Coronary artery disease involving native heart An individual plan was formulated based on patient history and exam, labs and evidence based data. Patient has not had recent angina or nitroglycerin use. continue present treatment.      Endocrine   Diabetic polyneuropathy (HCC)   Relevant Medications   nicotine (NICODERM CQ - DOSED IN MG/24 HOURS) 21 mg/24hr patch   LANTUS SOLOSTAR 100 UNIT/ML Solostar Pen   Other Relevant Orders   Hemoglobin A1c An individual care plan for diabetes was established and reinforced today.  The patient's status was assessed using clinical findings on exam, labs and diagnostic testing. Patient success at meeting goals based on disease  specific evidence-based guidelines and found to be good controlled. Medications were assessed and patient's understanding of the medical issues , including barriers were assessed. Recommend adherence to a diabetic diet, a graduated exercise program, HgbA1c level is checked quarterly, and urine microalbumin performed yearly .  Annual mono-filament sensation testing performed. Lower blood pressure and control hyperlipidemia is important. Get annual eye exams and annual flu shots and smoking cessation discussed.  Self management goals were discussed.      Other   Opiate abuse, continuous ([place]) - Primary Patient has  history of opoid abuse and has been on suboxane, recently failed drug test at pain clinic for 2nd time   Polysubstance abuse [facility]) Patient has history of multi abuse drugs in past    Mixed hyperlipidemia   Relevant Orders   Lipid panel AN INDIVIDUAL CARE PLAN for hyperlipidemia/ cholesterol was established and reinforced today.  The patient's status was assessed using clinical findings on exam, lab and other diagnostic tests. The patient's disease status was assessed  based on evidence-based guidelines and found to be fair controlled. MEDICATIONS were reviewed. SELF MANAGEMENT GOALS have been discussed and patient's success at attaining the goal of low cholesterol was assessed. RECOMMENDATION given include regular exercise 3 days a week and low cholesterol/low fat diet. CLINICAL SUMMARY including written plan to identify barriers unique to the patient due to social or economic  reasons was discussed.    Tobacco dependence   Relevant Medications   Individual plan was given to patient based on exam, history and other tests and using evidence based criteria for care for smoking cessation, he failed patches and medicines.  We discussed behavioral changes to help cessation and offered counseling to aid in quitting.  Medical consequences of tobacco use were explained.    Morbid obesity (HCC)    Relevant Medications   LANTUS SOLOSTAR 100 UNIT/ML Solostar Pen An individualize plan was formulated for obesity using patient history and physical exam to encourage weight loss.  An evidence based program was formulated.  Patient is to cut portion size with meals and to plan physical exercise 3 days a week at least 20 minutes.  Weight watchers and other programs are helpful.  Planned amount of weight loss 10 lbs. BMI 44   Chronic weakness Patient is having progressive problems taking care of himself at home and getting out of chair to perform ADLs.  He requires a lift chair to assist in getting from sitting to standing.  This will help prevent further deterioration of condition and with keep him independent.  Other Visit Diagnoses     Encounter for immunization       Relevant Orders   Flu Vaccine MDCK QUAD PF (Completed)     .  Meds ordered this encounter  Medications   celecoxib (CELEBREX) 200 MG capsule    Sig: Take 1 capsule (200 mg total) by mouth 2 (two) times daily.    Dispense:  60 capsule    Refill:  5    Orders Placed This Encounter  Procedures   Flu Vaccine MDCK QUAD PF   CBC with Differential/Platelet   Comprehensive metabolic panel   Hemoglobin A1c   Lipid panel    30 plus minute visit with discussion of pain and drug abuse.  No CHF symptoms Follow-up: Return in about 4 months (around 09/24/2021) for fasting.  An After Visit Summary was printed and given to the patient.  Reinaldo Meeker, MD [name] [phone]

## 2021-05-28 LAB — CBC WITH DIFFERENTIAL/PLATELET
Basophils Absolute: 0 10*3/uL (ref 0.0–0.2)
Basos: 0 %
EOS (ABSOLUTE): 0 10*3/uL (ref 0.0–0.4)
Eos: 0 %
Hematocrit: 43.7 % (ref 37.5–51.0)
Hemoglobin: 14.9 g/dL (ref 13.0–17.7)
Immature Grans (Abs): 0.1 10*3/uL (ref 0.0–0.1)
Immature Granulocytes: 1 %
Lymphocytes Absolute: 1.9 10*3/uL (ref 0.7–3.1)
Lymphs: 22 %
MCH: 31.5 pg (ref 26.6–33.0)
MCHC: 34.1 g/dL (ref 31.5–35.7)
MCV: 92 fL (ref 79–97)
Monocytes Absolute: 0.9 10*3/uL (ref 0.1–0.9)
Monocytes: 10 %
Neutrophils Absolute: 5.7 10*3/uL (ref 1.4–7.0)
Neutrophils: 67 %
Platelets: 337 10*3/uL (ref 150–450)
RBC: 4.73 x10E6/uL (ref 4.14–5.80)
RDW: 12.9 % (ref 11.6–15.4)
WBC: 8.6 10*3/uL (ref 3.4–10.8)

## 2021-05-28 LAB — COMPREHENSIVE METABOLIC PANEL
ALT: 13 IU/L (ref 0–44)
AST: 19 IU/L (ref 0–40)
Albumin/Globulin Ratio: 2 (ref 1.2–2.2)
Albumin: 4.5 g/dL (ref 3.8–4.8)
Alkaline Phosphatase: 61 IU/L (ref 44–121)
BUN/Creatinine Ratio: 13 (ref 10–24)
BUN: 18 mg/dL (ref 8–27)
Bilirubin Total: 0.9 mg/dL (ref 0.0–1.2)
CO2: 20 mmol/L (ref 20–29)
Calcium: 9.3 mg/dL (ref 8.6–10.2)
Chloride: 100 mmol/L (ref 96–106)
Creatinine, Ser: 1.36 mg/dL — ABNORMAL HIGH (ref 0.76–1.27)
Globulin, Total: 2.2 g/dL (ref 1.5–4.5)
Glucose: 119 mg/dL — ABNORMAL HIGH (ref 70–99)
Potassium: 5 mmol/L (ref 3.5–5.2)
Sodium: 138 mmol/L (ref 134–144)
Total Protein: 6.7 g/dL (ref 6.0–8.5)
eGFR: 59 mL/min/{1.73_m2} — ABNORMAL LOW (ref >59)

## 2021-05-28 LAB — HEMOGLOBIN A1C
Est. average glucose Bld gHb Est-mCnc: 148 mg/dL
Hgb A1c MFr Bld: 6.8 % — ABNORMAL HIGH (ref 4.8–5.6)

## 2021-05-28 LAB — LIPID PANEL
Chol/HDL Ratio: 3.9 ratio (ref 0.0–5.0)
Cholesterol, Total: 204 mg/dL — ABNORMAL HIGH (ref 100–199)
HDL: 52 mg/dL (ref >39)
LDL Chol Calc (NIH): 99 mg/dL (ref 0–99)
Triglycerides: 313 mg/dL — ABNORMAL HIGH (ref 0–149)
VLDL Cholesterol Cal: 53 mg/dL — ABNORMAL HIGH (ref 5–40)

## 2021-05-28 LAB — CARDIOVASCULAR RISK ASSESSMENT

## 2021-05-28 NOTE — Progress Notes (Signed)
CBC normal, glucose 119, kidney tests slightly lower, may be from arthritis medicines, liver tests normal, A1c 6.8. triglycerides high 313,  lp

## 2021-06-05 ENCOUNTER — Other Ambulatory Visit: Payer: Self-pay | Admitting: Cardiology

## 2021-06-05 ENCOUNTER — Other Ambulatory Visit: Payer: Self-pay | Admitting: Legal Medicine

## 2021-06-05 DIAGNOSIS — E1142 Type 2 diabetes mellitus with diabetic polyneuropathy: Secondary | ICD-10-CM

## 2021-06-05 DIAGNOSIS — G8929 Other chronic pain: Secondary | ICD-10-CM

## 2021-06-05 DIAGNOSIS — N401 Enlarged prostate with lower urinary tract symptoms: Secondary | ICD-10-CM

## 2021-06-05 DIAGNOSIS — R3911 Hesitancy of micturition: Secondary | ICD-10-CM

## 2021-06-06 ENCOUNTER — Ambulatory Visit (INDEPENDENT_AMBULATORY_CARE_PROVIDER_SITE_OTHER): Payer: Medicare Other | Admitting: Sports Medicine

## 2021-06-06 ENCOUNTER — Encounter: Payer: Self-pay | Admitting: Sports Medicine

## 2021-06-06 DIAGNOSIS — M79609 Pain in unspecified limb: Secondary | ICD-10-CM

## 2021-06-06 DIAGNOSIS — E1142 Type 2 diabetes mellitus with diabetic polyneuropathy: Secondary | ICD-10-CM

## 2021-06-06 DIAGNOSIS — B351 Tinea unguium: Secondary | ICD-10-CM | POA: Diagnosis not present

## 2021-06-06 NOTE — Progress Notes (Signed)
Subjective: Brent Ortiz is a 60 y.o. male patient with history of diabetes who presents to office today complaining of long,mildly painful nails  while ambulating in shoes; unable to trim. Patient states that he missed his last appointment.  Patient denies any other pedal complaints at this time.  Fasting blood sugar not recorded.  Patient Active Problem List   Diagnosis Date Noted   Coronary artery disease involving native coronary artery of native heart without angina pectoris 12/27/2020   Stage 3b chronic kidney disease ([place]) 12/27/2020   Prostatitis 12/04/2020   Morbid obesity ([place]) 11/21/2020   Depression, major, single episode, moderate ([place]) 07/11/2020   Coronary artery disease involving native heart 02/08/2020   HFrEF (heart failure with reduced ejection fraction) ([place]) 02/08/2020   Acute combined systolic and diastolic CHF, NYHA class 2 ([place]) 01/22/2020   ED (erectile dysfunction) 12/06/2019   Diabetic polyneuropathy ([place]) 12/06/2019   Chronic pain 10/19/2019   Obstructive chronic bronchitis with exacerbation ([place]) 10/03/2019   Mixed hyperlipidemia 06/18/2017   Alcohol abuse    Opiate abuse, continuous (HCC)    Polysubstance abuse (HCC)    Atherosclerosis of coronary artery of native heart with stable angina pectoris, unspecified vessel or lesion type ([place]) 01/23/2016   Essential hypertension 01/23/2016   Tobacco dependence 01/23/2016   Tobacco use disorder 01/23/2016   BMI 45.0-49.9, adult ([place]) 01/13/2013   Knee pain, left 02/12/2012   Essential thrombocythemia ([place]) 09/29/2011   Pneumonia due to infectious organism 09/04/2011   Current Outpatient Medications on File Prior to Visit  Medication Sig Dispense Refill   acetaminophen (TYLENOL) 325 MG tablet Take 650 mg by mouth every 4 (four) hours as needed for mild pain.     albuterol (VENTOLIN HFA) 108 (90 Base) MCG/ACT inhaler INHALE 2 PUFFS INTO THE LUNGS EVERY 4 HOURS AS NEEDED FOR WHEEZING OR SHORTNESS OF BREATH.  8.5 g 6   amLODipine (NORVASC) 5 MG tablet Take 5 mg by mouth daily.     B-D ULTRAFINE III SHORT PEN 31G X 8 MM MISC SMARTSIG:Injection Daily     celecoxib (CELEBREX) 200 MG capsule Take 1 capsule (200 mg total) by mouth 2 (two) times daily. 60 capsule 5   colchicine-probenecid 0.5-500 MG tablet Take 1 tablet by mouth 2 (two) times daily as needed.     diclofenac Sodium (VOLTAREN) 1 % GEL Apply 2 g topically 4 (four) times daily.     ezetimibe (ZETIA) 10 MG tablet Take 10 mg by mouth daily.     gabapentin (NEURONTIN) 300 MG capsule TAKE 1 CAPSULE ($RemoveBe'300MG'CkuzuJzOD$ ) BY MOUTH TWICE DAILY.(YELLOW CAP) 186 capsule 3   Glucagon, rDNA, (GLUCAGON EMERGENCY) 1 MG KIT Inject 1 mg into the muscle as needed (BG less than 70).     GOODSENSE ASPIRIN LOW DOSE 81 MG EC tablet TAKE 1 TABLET ($RemoveB'81MG'PTSrcrqw$ ) BY MOUTH DAILY. SWALLOW WHOLE.(YELLOW ROUND TAB WITH HEART) 90 tablet 2   indomethacin (INDOCIN) 25 MG capsule TAKE 1 CAPSULE ($RemoveBe'25MG'mRHEHQneJ$  TOTAL) BY MOUTH 3 TIMES DAILY. (GREEN CAPSULE WITH G406 [id number]) 93 capsule 3   isosorbide mononitrate (IMDUR) 60 MG 24 hr tablet Take 60 mg by mouth daily.     LANTUS SOLOSTAR 100 UNIT/ML Solostar Pen SMARTSIG:30 Unit(s) SUB-Q Daily     meclizine (ANTIVERT) 12.5 MG tablet Take 12.5 mg by mouth 3 (three) times daily as needed for dizziness.     metFORMIN (GLUCOPHAGE) 500 MG tablet TAKE 1 TABLET BY MOUTH EVERY DAY WITH SUPPER.(ROUND WHITE TAB WITH H 102) 90 tablet 2  metoprolol succinate (TOPROL-XL) 25 MG 24 hr tablet TAKE 1 TABLET (25 MG TOTAL) BY MOUTH DAILY.(OVAL WHITE TAB WITH E7) 31 tablet 3   nicotine (NICODERM CQ - DOSED IN MG/24 HOURS) 21 mg/24hr patch 21 mg daily.     nicotine (NICODERM CQ - DOSED IN MG/24 HR) 7 mg/24hr patch APPLY 1 PATCH ONTO THE SKIN ONCE DAILY ** DISCARD PROPERLY 30 patch 3   nitroGLYCERIN (NITROSTAT) 0.4 MG SL tablet Place 1 tablet (0.4 mg total) under the tongue every 5 (five) minutes as needed. For chest pain 50 tablet 3   pantoprazole (PROTONIX) 40 MG tablet TAKE 1  TABLET($RemoveB'40MG'UQgmZRbH$ ) BY MOUTH DAILY.(OVALYELLOW TAB WITH H126) 90 tablet 2   polyethylene glycol (MIRALAX / GLYCOLAX) 17 g packet Take 17 g by mouth daily.     potassium chloride SA (KLOR-CON) 20 MEQ tablet TAKE 1 TABLET(20MEQ TOTAL) BY MOUTH 4 TIMES DAILY.(OBLONG WHITE TAB WITH KC M20) 120 tablet 2   ranolazine (RANEXA) 500 MG 12 hr tablet TAKE 1 TABLET BY MOUTH 2 TIMES DAILY (PEACH COLORED TABLET WITH C49 OR 588) 186 tablet 0   rosuvastatin (CRESTOR) 20 MG tablet Take by mouth.     sacubitril-valsartan (ENTRESTO) 24-26 MG Take 1 tablet by mouth 2 (two) times daily. 60 tablet 2   sertraline (ZOLOFT) 25 MG tablet Take 1 tablet (25 mg total) by mouth daily. 90 tablet 2   spironolactone (ALDACTONE) 25 MG tablet TAKE 1/2 TABLET(12.5) BY MOUTH DAILY. (HALF TAN TAB) 30 tablet 3   SYMBICORT 160-4.5 MCG/ACT inhaler Inhale 2 puffs into the lungs 2 (two) times daily. 30.8 g 2   tamsulosin (FLOMAX) 0.4 MG CAPS capsule TAKE 1 CAPSULE (.$RemoveBef'4MG'TxFPuzCdOi$  TOTAL) BY MOUTH DAILY.(TAN AND GOLD CAPSULE WITH CL23 0.4) 93 capsule 3   traZODone (DESYREL) 150 MG tablet Take 1 tablet (150 mg total) by mouth at bedtime. 90 tablet 2   No current facility-administered medications on file prior to visit.   Allergies  Allergen Reactions   Motrin [Ibuprofen] Other (See Comments)    Reaction: ulcers   Other Nausea And Vomiting    Patient has ulcers    Recent Results (from the past 2160 hour(s))  CBC with Differential/Platelet     Status: None   Collection Time: 05/27/21 10:36 AM  Result Value Ref Range   WBC 8.6 3.4 - 10.8 x10E3/uL   RBC 4.73 4.14 - 5.80 x10E6/uL   Hemoglobin 14.9 13.0 - 17.7 g/dL   Hematocrit 43.7 37.5 - 51.0 %   MCV 92 79 - 97 fL   MCH 31.5 26.6 - 33.0 pg   MCHC 34.1 31.5 - 35.7 g/dL   RDW 12.9 11.6 - 15.4 %   Platelets 337 150 - 450 x10E3/uL   Neutrophils 67 Not Estab. %   Lymphs 22 Not Estab. %   Monocytes 10 Not Estab. %   Eos 0 Not Estab. %   Basos 0 Not Estab. %   Neutrophils Absolute 5.7 1.4 - 7.0  x10E3/uL   Lymphocytes Absolute 1.9 0.7 - 3.1 x10E3/uL   Monocytes Absolute 0.9 0.1 - 0.9 x10E3/uL   EOS (ABSOLUTE) 0.0 0.0 - 0.4 x10E3/uL   Basophils Absolute 0.0 0.0 - 0.2 x10E3/uL   Immature Granulocytes 1 Not Estab. %   Immature Grans (Abs) 0.1 0.0 - 0.1 x10E3/uL  Comprehensive metabolic panel     Status: Abnormal   Collection Time: 05/27/21 10:36 AM  Result Value Ref Range   Glucose 119 (H) 70 - 99 mg/dL   BUN 18  8 - 27 mg/dL   Creatinine, Ser 1.36 (H) 0.76 - 1.27 mg/dL   eGFR 59 (L) >59 mL/min/1.73   BUN/Creatinine Ratio 13 10 - 24   Sodium 138 134 - 144 mmol/L   Potassium 5.0 3.5 - 5.2 mmol/L   Chloride 100 96 - 106 mmol/L   CO2 20 20 - 29 mmol/L   Calcium 9.3 8.6 - 10.2 mg/dL   Total Protein 6.7 6.0 - 8.5 g/dL   Albumin 4.5 3.8 - 4.8 g/dL   Globulin, Total 2.2 1.5 - 4.5 g/dL   Albumin/Globulin Ratio 2.0 1.2 - 2.2   Bilirubin Total 0.9 0.0 - 1.2 mg/dL   Alkaline Phosphatase 61 44 - 121 IU/L   AST 19 0 - 40 IU/L   ALT 13 0 - 44 IU/L  Hemoglobin A1c     Status: Abnormal   Collection Time: 05/27/21 10:36 AM  Result Value Ref Range   Hgb A1c MFr Bld 6.8 (H) 4.8 - 5.6 %    Comment:          Prediabetes: 5.7 - 6.4          Diabetes: >6.4          Glycemic control for adults with diabetes: <7.0    Est. average glucose Bld gHb Est-mCnc 148 mg/dL  Lipid panel     Status: Abnormal   Collection Time: 05/27/21 10:36 AM  Result Value Ref Range   Cholesterol, Total 204 (H) 100 - 199 mg/dL   Triglycerides 313 (H) 0 - 149 mg/dL   HDL 52 >39 mg/dL   VLDL Cholesterol Cal 53 (H) 5 - 40 mg/dL   LDL Chol Calc (NIH) 99 0 - 99 mg/dL   Chol/HDL Ratio 3.9 0.0 - 5.0 ratio    Comment:                                   T. Chol/HDL Ratio                                             Men  Women                               1/2 Avg.Risk  3.4    3.3                                   Avg.Risk  5.0    4.4                                2X Avg.Risk  9.6    7.1                                3X  Avg.Risk 23.4   11.0   Cardiovascular Risk Assessment     Status: None   Collection Time: 05/27/21 10:36 AM  Result Value Ref Range   Interpretation Note     Comment: Supplemental report is available.    Objective: General: Patient is awake, alert, and oriented x 3 and in no acute distress.  Integument: Skin is warm, dry and supple bilateral. Nails are tender, long, thickened and dystrophic with subungual debris, consistent with onychomycosis, 1-5 bilateral. No signs of infection. No open lesions or preulcerative lesions present bilateral. Remaining integument unremarkable.  Vasculature:  Dorsalis Pedis pulse 1/4 bilateral. Posterior Tibial pulse  1/4 bilateral.  Capillary fill time <3 sec 1-5 bilateral. Positive hair growth to the level of the digits. Temperature gradient within normal limits. No varicosities present bilateral. No edema present bilateral.   Neurology: The patient has intact sensation measured with a 5.07/10g Semmes Weinstein Monofilament at all pedal sites bilateral . Vibratory sensation diminished bilateral with tuning fork. No Babinski sign present bilateral.   Musculoskeletal: Asymptomatic pes planus pedal deformities noted bilateral. Muscular strength 5/5 in all lower extremity muscular groups bilateral without pain on range of motion . No tenderness with calf compression bilateral.  Assessment and Plan: Problem List Items Addressed This Visit       Endocrine   Diabetic polyneuropathy ([place])   Other Visit Diagnoses     Pain due to onychomycosis of nail    -  Primary       -Examined patient. -Re-discussed and educated patient on diabetic foot care, especially with  regards to the vascular, neurological and musculoskeletal systems.  -Mechanically debrided all nails 1-5 bilateral using sterile nail nipper and filed with dremel without incident -Encourage skin emollients -Answered all patient questions -Patient to return  in 3 months for at risk foot  care -Patient advised to call the office if any problems or questions arise in the meantime.  [name], DPM

## 2021-06-13 ENCOUNTER — Telehealth: Payer: Self-pay

## 2021-06-13 NOTE — Chronic Care Management (AMB) (Signed)
Chronic Care Management Pharmacy Assistant   Name: Brent Ortiz  MRN: [id number] DOB: [birth year]  Reason for Encounter: Disease State/ Hypertension, Diabetes  Recent office visits:  05-27-2021 Brent Anes, MD. Glucose= 119, Creatinine= 1.36, eGFR= 59. A1C= 6.8. Cholesterol= 204, Trig= 313, VLDL= 53. START Celebrex 200 mg twice daily. INCREASE Lantus 20 units daily TO 30 units daily. STOP indocin, meloxicam and percocet.  Recent consult visits:  06-06-2021 Brent Ortiz, DPM (Podiatry). "Re-discussed and educated patient on diabetic foot care, especially with  regards to the vascular, neurological and musculoskeletal systems. Mechanically debrided all nails 1-5 bilateral using sterile nail nipper and filed with dremel without incident. Encourage skin emollients. Follow up in 3 months".   Hospital visits:  Medication Reconciliation was completed by comparing discharge summary, patient's EMR and Pharmacy list, and upon discussion with patient.  Admitted to the hospital on 01-11-2021. Discharge date was 01-11-2021  Discharged from [facility] to view encounter  Medications: Outpatient Encounter Medications as of 06/13/2021  Medication Sig   acetaminophen (TYLENOL) 325 MG tablet Take 650 mg by mouth every 4 (four) hours as needed for mild pain.   albuterol (VENTOLIN HFA) 108 (90 Base) MCG/ACT inhaler INHALE 2 PUFFS INTO THE LUNGS EVERY 4 HOURS AS NEEDED FOR WHEEZING OR SHORTNESS OF BREATH.   amLODipine (NORVASC) 5 MG tablet Take 5 mg by mouth daily.   B-D ULTRAFINE III SHORT PEN 31G X 8 MM MISC SMARTSIG:Injection Daily   celecoxib (CELEBREX) 200 MG capsule Take 1 capsule (200 mg total) by mouth 2 (two) times daily.   colchicine-probenecid 0.5-500 MG tablet Take 1 tablet by mouth 2 (two) times daily as needed.   diclofenac Sodium (VOLTAREN) 1 % GEL Apply 2 g topically 4 (four) times daily.   ezetimibe (ZETIA) 10 MG tablet Take 10 mg by mouth daily.    gabapentin (NEURONTIN) 300 MG capsule TAKE 1 CAPSULE ($RemoveBe'300MG'wfcznAHvW$ ) BY MOUTH TWICE DAILY.(YELLOW CAP)   Glucagon, rDNA, (GLUCAGON EMERGENCY) 1 MG KIT Inject 1 mg into the muscle as needed (BG less than 70).   GOODSENSE ASPIRIN LOW DOSE 81 MG EC tablet TAKE 1 TABLET ($RemoveB'81MG'KVaGCNMe$ ) BY MOUTH DAILY. SWALLOW WHOLE.(YELLOW ROUND TAB WITH HEART)   indomethacin (INDOCIN) 25 MG capsule TAKE 1 CAPSULE ($RemoveBe'25MG'xqWERjmYy$  TOTAL) BY MOUTH 3 TIMES DAILY. (GREEN CAPSULE WITH G406 [id number])   isosorbide mononitrate (IMDUR) 60 MG 24 hr tablet Take 60 mg by mouth daily.   LANTUS SOLOSTAR 100 UNIT/ML Solostar Pen SMARTSIG:30 Unit(s) SUB-Q Daily   meclizine (ANTIVERT) 12.5 MG tablet Take 12.5 mg by mouth 3 (three) times daily as needed for dizziness.   metFORMIN (GLUCOPHAGE) 500 MG tablet TAKE 1 TABLET BY MOUTH EVERY DAY WITH SUPPER.(ROUND WHITE TAB WITH H 102)   metoprolol succinate (TOPROL-XL) 25 MG 24 hr tablet TAKE 1 TABLET (25 MG TOTAL) BY MOUTH DAILY.(OVAL WHITE TAB WITH E7)   nicotine (NICODERM CQ - DOSED IN MG/24 HOURS) 21 mg/24hr patch 21 mg daily.   nicotine (NICODERM CQ - DOSED IN MG/24 HR) 7 mg/24hr patch APPLY 1 PATCH ONTO THE SKIN ONCE DAILY ** DISCARD PROPERLY   nitroGLYCERIN (NITROSTAT) 0.4 MG SL tablet Place 1 tablet (0.4 mg total) under the tongue every 5 (five) minutes as needed. For chest pain   pantoprazole (PROTONIX) 40 MG tablet TAKE 1 TABLET($RemoveBefor'40MG'IPwoDyecKJxv$ ) BY MOUTH DAILY.(OVALYELLOW TAB WITH H126)   polyethylene glycol (MIRALAX / GLYCOLAX) 17 g packet Take 17 g by mouth daily.   potassium chloride SA (KLOR-CON) 20 MEQ tablet  TAKE 1 TABLET(20MEQ TOTAL) BY MOUTH 4 TIMES DAILY.(OBLONG WHITE TAB WITH KC M20)   ranolazine (RANEXA) 500 MG 12 hr tablet TAKE 1 TABLET BY MOUTH 2 TIMES DAILY (PEACH COLORED TABLET WITH C49 OR 588)   rosuvastatin (CRESTOR) 20 MG tablet Take by mouth.   sacubitril-valsartan (ENTRESTO) 24-26 MG Take 1 tablet by mouth 2 (two) times daily.   sertraline (ZOLOFT) 25 MG tablet Take 1 tablet (25 mg total) by mouth  daily.   spironolactone (ALDACTONE) 25 MG tablet TAKE 1/2 TABLET(12.5) BY MOUTH DAILY. (HALF TAN TAB)   SYMBICORT 160-4.5 MCG/ACT inhaler Inhale 2 puffs into the lungs 2 (two) times daily.   tamsulosin (FLOMAX) 0.4 MG CAPS capsule TAKE 1 CAPSULE (.$RemoveBef'4MG'gtPkSRgAcG$  TOTAL) BY MOUTH DAILY.(TAN AND GOLD CAPSULE WITH CL23 0.4)   traZODone (DESYREL) 150 MG tablet Take 1 tablet (150 mg total) by mouth at bedtime.   No facility-administered encounter medications on file as of 06/13/2021.   Recent Relevant Labs: Lab Results  Component Value Date/Time   HGBA1C 6.8 (H) 05/27/2021 10:36 AM   HGBA1C 7.6 (H) 01/24/2021 09:19 AM   MICROALBUR 80 09/27/2020 10:46 AM    Kidney Function Lab Results  Component Value Date/Time   CREATININE 1.36 (H) 05/27/2021 10:36 AM   CREATININE 1.16 01/24/2021 09:19 AM   CREATININE 0.87 09/29/2011 05:00 PM   GFRNONAA 68 06/26/2020 03:00 PM   GFRNONAA >89 09/29/2011 05:00 PM   GFRAA 78 06/26/2020 03:00 PM   GFRAA >89 09/29/2011 05:00 PM     Recent Office Vitals: BP Readings from Last 3 Encounters:  05/27/21 120/70  01/24/21 (!) 142/90  12/27/20 116/78   Pulse Readings from Last 3 Encounters:  05/27/21 90  01/24/21 78  12/27/20 85    Wt Readings from Last 3 Encounters:  05/27/21 290 lb (131.5 kg)  01/24/21 (!) 302 lb (137 kg)  12/27/20 (!) 312 lb (141.5 kg)     Kidney Function Lab Results  Component Value Date/Time   CREATININE 1.36 (H) 05/27/2021 10:36 AM   CREATININE 1.16 01/24/2021 09:19 AM   CREATININE 0.87 09/29/2011 05:00 PM   GFRNONAA 68 06/26/2020 03:00 PM   GFRNONAA >89 09/29/2011 05:00 PM   GFRAA 78 06/26/2020 03:00 PM   GFRAA >89 09/29/2011 05:00 PM    BMP Latest Ref Rng & Units 05/27/2021 01/24/2021 12/27/2020  Glucose 70 - 99 mg/dL 119(H) 177(H) 152(H)  BUN 8 - 27 mg/dL $Remove'18 16 18  'LZoCHmb$ Creatinine 0.76 - 1.27 mg/dL 1.36(H) 1.16 1.23  BUN/Creat Ratio 10 - $Re'24 13 14 15  'PIh$ Sodium 134 - 144 mmol/L 138 136 135  Potassium 3.5 - 5.2 mmol/L 5.0 5.0 4.5   Chloride 96 - 106 mmol/L 100 101 97  CO2 20 - 29 mmol/L $RemoveB'20 20 22  'GwvvTCHM$ Calcium 8.6 - 10.2 mg/dL 9.3 9.1 9.4     06-13-2021: 1st attempt left VM 06-20-2021: 2nd attempt left VM 07-01-2021 3rd attempt left VM  Care Gaps: Last eye exam / Retinopathy Screening?  Annual Wellness Visit? 07-02-2021 Last Diabetic Foot Exam?  PNA Vac overdue HIV screening overdue Hep C screening overdue Tdap overdue Shingrix overdue Covid booster overdue  Star Rating Drugs: Rosuvastatin 20 mg- Last filled 06-05-2021 31 DS Metfromin 500 mg- Last filled 06-05-2021 31 DS Entresto 24-26 mg- Last filled 06-05-2021 31 DS  [place] Clinical Pharmacist Assistant [phone]

## 2021-07-02 ENCOUNTER — Telehealth: Payer: Self-pay

## 2021-07-02 NOTE — Telephone Encounter (Signed)
Attempted to reach out to patient for AWV - unsuccessful.

## 2021-07-10 ENCOUNTER — Telehealth: Payer: Self-pay

## 2021-07-10 NOTE — Chronic Care Management (AMB) (Signed)
Chronic Care Management Pharmacy Assistant   Name: Brent Ortiz  MRN: [id number] DOB: 03-07-1961  Reason for Encounter: Disease State/ General  Recent office visits:  05-27-2021 [name], MD. Glucose= 119, Creatinine= 1.36, eGFR= 59. A1C= 6.8. Cholesterol= 204, Trig= 313, VLDL= 53. START Celebrex 200 mg twice daily. INCREASE Lantus 20 units daily TO 30 units daily. STOP indocin, meloxicam and percocet.  Recent consult visits:  06-06-2021 [name], DPM (Podiatry). "Re-discussed and educated patient on diabetic foot care, especially with  regards to the vascular, neurological and musculoskeletal systems. Mechanically debrided all nails 1-5 bilateral using sterile nail nipper and filed with dremel without incident. Encourage skin emollients. Follow up in 3 months".   Hospital visits:  Medication Reconciliation was completed by comparing discharge summary, patients EMR and Pharmacy list, and upon discussion with patient.   Admitted to the hospital on 01-11-2021. Discharge date was 01-11-2021  Discharged from [place] to view encounter.  Medications: Outpatient Encounter Medications as of 07/10/2021  Medication Sig   acetaminophen (TYLENOL) 325 MG tablet Take 650 mg by mouth every 4 (four) hours as needed for mild pain.   albuterol (VENTOLIN HFA) 108 (90 Base) MCG/ACT inhaler INHALE 2 PUFFS INTO THE LUNGS EVERY 4 HOURS AS NEEDED FOR WHEEZING OR SHORTNESS OF BREATH.   amLODipine (NORVASC) 5 MG tablet Take 5 mg by mouth daily.   B-D ULTRAFINE III SHORT PEN 31G X 8 MM MISC SMARTSIG:Injection Daily   celecoxib (CELEBREX) 200 MG capsule Take 1 capsule (200 mg total) by mouth 2 (two) times daily.   colchicine-probenecid 0.5-500 MG tablet Take 1 tablet by mouth 2 (two) times daily as needed.   diclofenac Sodium (VOLTAREN) 1 % GEL Apply 2 g topically 4 (four) times daily.   ezetimibe (ZETIA) 10 MG tablet Take 10 mg by mouth daily.   gabapentin (NEURONTIN)  300 MG capsule TAKE 1 CAPSULE (300MG) BY MOUTH TWICE DAILY.(YELLOW CAP)   Glucagon, rDNA, (GLUCAGON EMERGENCY) 1 MG KIT Inject 1 mg into the muscle as needed (BG less than 70).   GOODSENSE ASPIRIN LOW DOSE 81 MG EC tablet TAKE 1 TABLET (81MG) BY MOUTH DAILY. SWALLOW WHOLE.(YELLOW ROUND TAB WITH HEART)   indomethacin (INDOCIN) 25 MG capsule TAKE 1 CAPSULE (25MG TOTAL) BY MOUTH 3 TIMES DAILY. (GREEN CAPSULE WITH G406 [id number])   isosorbide mononitrate (IMDUR) 60 MG 24 hr tablet Take 60 mg by mouth daily.   LANTUS SOLOSTAR 100 UNIT/ML Solostar Pen SMARTSIG:30 Unit(s) SUB-Q Daily   meclizine (ANTIVERT) 12.5 MG tablet Take 12.5 mg by mouth 3 (three) times daily as needed for dizziness.   metFORMIN (GLUCOPHAGE) 500 MG tablet TAKE 1 TABLET BY MOUTH EVERY DAY WITH SUPPER.(ROUND WHITE TAB WITH H 102)   metoprolol succinate (TOPROL-XL) 25 MG 24 hr tablet TAKE 1 TABLET (25 MG TOTAL) BY MOUTH DAILY.(OVAL WHITE TAB WITH E7)   nicotine (NICODERM CQ - DOSED IN MG/24 HOURS) 21 mg/24hr patch 21 mg daily.   nicotine (NICODERM CQ - DOSED IN MG/24 HR) 7 mg/24hr patch APPLY 1 PATCH ONTO THE SKIN ONCE DAILY ** DISCARD PROPERLY   nitroGLYCERIN (NITROSTAT) 0.4 MG SL tablet Place 1 tablet (0.4 mg total) under the tongue every 5 (five) minutes as needed. For chest pain   pantoprazole (PROTONIX) 40 MG tablet TAKE 1 TABLET(40MG) BY MOUTH DAILY.(OVALYELLOW TAB WITH H126)   polyethylene glycol (MIRALAX / GLYCOLAX) 17 g packet Take 17 g by mouth daily.   potassium chloride SA (KLOR-CON) 20 MEQ tablet  TAKE 1 TABLET(20MEQ TOTAL) BY MOUTH 4 TIMES DAILY.(OBLONG WHITE TAB WITH KC M20)   ranolazine (RANEXA) 500 MG 12 hr tablet TAKE 1 TABLET BY MOUTH 2 TIMES DAILY (PEACH COLORED TABLET WITH C49 OR 588)   rosuvastatin (CRESTOR) 20 MG tablet Take by mouth.   sacubitril-valsartan (ENTRESTO) 24-26 MG Take 1 tablet by mouth 2 (two) times daily.   sertraline (ZOLOFT) 25 MG tablet Take 1 tablet (25 mg total) by mouth daily.   spironolactone  (ALDACTONE) 25 MG tablet TAKE 1/2 TABLET(12.5) BY MOUTH DAILY. (HALF TAN TAB)   SYMBICORT 160-4.5 MCG/ACT inhaler Inhale 2 puffs into the lungs 2 (two) times daily.   tamsulosin (FLOMAX) 0.4 MG CAPS capsule TAKE 1 CAPSULE (.4MG TOTAL) BY MOUTH DAILY.(TAN AND GOLD CAPSULE WITH CL23 0.4)   traZODone (DESYREL) 150 MG tablet Take 1 tablet (150 mg total) by mouth at bedtime.   No facility-administered encounter medications on file as of 07/10/2021.  Recent Relevant Labs: Lab Results  Component Value Date/Time   HGBA1C 6.8 (H) 05/27/2021 10:36 AM   HGBA1C 7.6 (H) 01/24/2021 09:19 AM   MICROALBUR 80 09/27/2020 10:46 AM    Kidney Function Lab Results  Component Value Date/Time   CREATININE 1.36 (H) 05/27/2021 10:36 AM   CREATININE 1.16 01/24/2021 09:19 AM   CREATININE 0.87 09/29/2011 05:00 PM   OFVWAQLR 37 06/26/2020 03:00 PM   GFRNONAA >89 09/29/2011 05:00 PM   GFRAA 78 06/26/2020 03:00 PM   GFRAA >89 09/29/2011 05:00 PM    07-10-2021: 1st attempt left VM 07-18-2021: 2nd attempt left VM 07-22-2021: 3rd attempt left VM  Care Gaps: Last eye exam / Retinopathy Screening?  Last Annual Wellness Visit? Missed 07-02-2021 Last Diabetic Foot Exam?  PNA Vac overdue HIV screening overdue Hep C screening overdue Tdap overdue Shingrix overdue Covid booster overdue  Star Rating Drugs: Rosuvastatin 20 mg- Last filled 07-14-2021 30 DS Metfromin 500 mg- Last filled 06-05-2021 90 DS Entresto 24-26 mg- Last filled 07-14-2021 90 DS  [place] Clinical Pharmacist Assistant [phone]

## 2021-07-14 ENCOUNTER — Other Ambulatory Visit: Payer: Self-pay | Admitting: Legal Medicine

## 2021-07-14 ENCOUNTER — Other Ambulatory Visit: Payer: Self-pay | Admitting: Family Medicine

## 2021-07-16 ENCOUNTER — Other Ambulatory Visit: Payer: Self-pay | Admitting: Legal Medicine

## 2021-07-16 DIAGNOSIS — E1142 Type 2 diabetes mellitus with diabetic polyneuropathy: Secondary | ICD-10-CM

## 2021-07-21 ENCOUNTER — Telehealth: Payer: Self-pay

## 2021-07-21 DIAGNOSIS — J439 Emphysema, unspecified: Secondary | ICD-10-CM | POA: Diagnosis not present

## 2021-07-21 DIAGNOSIS — I517 Cardiomegaly: Secondary | ICD-10-CM | POA: Diagnosis not present

## 2021-07-21 DIAGNOSIS — I252 Old myocardial infarction: Secondary | ICD-10-CM | POA: Diagnosis not present

## 2021-07-21 DIAGNOSIS — Z79899 Other long term (current) drug therapy: Secondary | ICD-10-CM | POA: Diagnosis not present

## 2021-07-21 DIAGNOSIS — E1165 Type 2 diabetes mellitus with hyperglycemia: Secondary | ICD-10-CM | POA: Diagnosis not present

## 2021-07-21 DIAGNOSIS — F32A Depression, unspecified: Secondary | ICD-10-CM | POA: Diagnosis not present

## 2021-07-21 DIAGNOSIS — M1990 Unspecified osteoarthritis, unspecified site: Secondary | ICD-10-CM | POA: Diagnosis not present

## 2021-07-21 DIAGNOSIS — Z7902 Long term (current) use of antithrombotics/antiplatelets: Secondary | ICD-10-CM | POA: Diagnosis not present

## 2021-07-21 DIAGNOSIS — G8929 Other chronic pain: Secondary | ICD-10-CM | POA: Diagnosis not present

## 2021-07-21 DIAGNOSIS — I6523 Occlusion and stenosis of bilateral carotid arteries: Secondary | ICD-10-CM | POA: Diagnosis not present

## 2021-07-21 DIAGNOSIS — I639 Cerebral infarction, unspecified: Secondary | ICD-10-CM | POA: Diagnosis not present

## 2021-07-21 DIAGNOSIS — Z87440 Personal history of urinary (tract) infections: Secondary | ICD-10-CM | POA: Diagnosis not present

## 2021-07-21 DIAGNOSIS — I69992 Facial weakness following unspecified cerebrovascular disease: Secondary | ICD-10-CM | POA: Diagnosis not present

## 2021-07-21 DIAGNOSIS — I2510 Atherosclerotic heart disease of native coronary artery without angina pectoris: Secondary | ICD-10-CM | POA: Diagnosis not present

## 2021-07-21 DIAGNOSIS — Z7984 Long term (current) use of oral hypoglycemic drugs: Secondary | ICD-10-CM | POA: Diagnosis not present

## 2021-07-21 DIAGNOSIS — I10 Essential (primary) hypertension: Secondary | ICD-10-CM | POA: Diagnosis not present

## 2021-07-21 DIAGNOSIS — F17210 Nicotine dependence, cigarettes, uncomplicated: Secondary | ICD-10-CM | POA: Diagnosis not present

## 2021-07-21 DIAGNOSIS — G8194 Hemiplegia, unspecified affecting left nondominant side: Secondary | ICD-10-CM | POA: Diagnosis not present

## 2021-07-21 DIAGNOSIS — I5022 Chronic systolic (congestive) heart failure: Secondary | ICD-10-CM | POA: Diagnosis not present

## 2021-07-21 DIAGNOSIS — K219 Gastro-esophageal reflux disease without esophagitis: Secondary | ICD-10-CM | POA: Diagnosis not present

## 2021-07-21 DIAGNOSIS — I110 Hypertensive heart disease with heart failure: Secondary | ICD-10-CM | POA: Diagnosis not present

## 2021-07-21 DIAGNOSIS — I6381 Other cerebral infarction due to occlusion or stenosis of small artery: Secondary | ICD-10-CM | POA: Diagnosis not present

## 2021-07-21 DIAGNOSIS — Z7982 Long term (current) use of aspirin: Secondary | ICD-10-CM | POA: Diagnosis not present

## 2021-07-21 DIAGNOSIS — I509 Heart failure, unspecified: Secondary | ICD-10-CM | POA: Diagnosis not present

## 2021-07-21 DIAGNOSIS — R29702 NIHSS score 2: Secondary | ICD-10-CM | POA: Diagnosis not present

## 2021-07-21 DIAGNOSIS — M109 Gout, unspecified: Secondary | ICD-10-CM | POA: Diagnosis not present

## 2021-07-21 DIAGNOSIS — E7800 Pure hypercholesterolemia, unspecified: Secondary | ICD-10-CM | POA: Diagnosis not present

## 2021-07-21 DIAGNOSIS — I6502 Occlusion and stenosis of left vertebral artery: Secondary | ICD-10-CM | POA: Diagnosis not present

## 2021-07-21 DIAGNOSIS — D7589 Other specified diseases of blood and blood-forming organs: Secondary | ICD-10-CM | POA: Diagnosis not present

## 2021-07-21 DIAGNOSIS — R202 Paresthesia of skin: Secondary | ICD-10-CM | POA: Diagnosis not present

## 2021-07-21 DIAGNOSIS — R29701 NIHSS score 1: Secondary | ICD-10-CM | POA: Diagnosis not present

## 2021-07-21 DIAGNOSIS — R200 Anesthesia of skin: Secondary | ICD-10-CM | POA: Diagnosis not present

## 2021-07-21 NOTE — Telephone Encounter (Signed)
Patient calling due to numbness on left side of face. Has been ongoing for one one-half week. Denies chest pain, chest tightness, abnormal back pain, weakness. Has not been checking blood pressure or blood glucose. Numbness has been off/on. Pt checked BP while on phone reports it is 187/109, no current numbness.   Advised pt report to ED as soon as he can. He VU.   Royce Macadamia, [place] 07/21/21 10:41 AM

## 2021-07-21 NOTE — Telephone Encounter (Signed)
Can schedule later in week or go to er lp

## 2021-07-23 ENCOUNTER — Other Ambulatory Visit: Payer: Self-pay

## 2021-07-23 DIAGNOSIS — E1165 Type 2 diabetes mellitus with hyperglycemia: Secondary | ICD-10-CM | POA: Diagnosis not present

## 2021-07-23 DIAGNOSIS — J439 Emphysema, unspecified: Secondary | ICD-10-CM | POA: Diagnosis not present

## 2021-07-23 DIAGNOSIS — G8929 Other chronic pain: Secondary | ICD-10-CM | POA: Diagnosis not present

## 2021-07-23 DIAGNOSIS — Z7984 Long term (current) use of oral hypoglycemic drugs: Secondary | ICD-10-CM | POA: Diagnosis not present

## 2021-07-23 DIAGNOSIS — I2510 Atherosclerotic heart disease of native coronary artery without angina pectoris: Secondary | ICD-10-CM | POA: Diagnosis not present

## 2021-07-23 DIAGNOSIS — I110 Hypertensive heart disease with heart failure: Secondary | ICD-10-CM | POA: Diagnosis not present

## 2021-07-23 DIAGNOSIS — I252 Old myocardial infarction: Secondary | ICD-10-CM | POA: Diagnosis not present

## 2021-07-23 DIAGNOSIS — Z7902 Long term (current) use of antithrombotics/antiplatelets: Secondary | ICD-10-CM | POA: Diagnosis not present

## 2021-07-23 DIAGNOSIS — E782 Mixed hyperlipidemia: Secondary | ICD-10-CM

## 2021-07-23 DIAGNOSIS — E7800 Pure hypercholesterolemia, unspecified: Secondary | ICD-10-CM | POA: Diagnosis not present

## 2021-07-23 DIAGNOSIS — I25118 Atherosclerotic heart disease of native coronary artery with other forms of angina pectoris: Secondary | ICD-10-CM

## 2021-07-23 DIAGNOSIS — I5020 Unspecified systolic (congestive) heart failure: Secondary | ICD-10-CM | POA: Diagnosis not present

## 2021-07-23 DIAGNOSIS — D7589 Other specified diseases of blood and blood-forming organs: Secondary | ICD-10-CM | POA: Diagnosis not present

## 2021-07-23 DIAGNOSIS — M1A09X0 Idiopathic chronic gout, multiple sites, without tophus (tophi): Secondary | ICD-10-CM

## 2021-07-23 DIAGNOSIS — F17210 Nicotine dependence, cigarettes, uncomplicated: Secondary | ICD-10-CM | POA: Diagnosis not present

## 2021-07-23 DIAGNOSIS — Z87442 Personal history of urinary calculi: Secondary | ICD-10-CM | POA: Diagnosis not present

## 2021-07-23 DIAGNOSIS — K219 Gastro-esophageal reflux disease without esophagitis: Secondary | ICD-10-CM | POA: Diagnosis not present

## 2021-07-23 DIAGNOSIS — M109 Gout, unspecified: Secondary | ICD-10-CM | POA: Diagnosis not present

## 2021-07-23 DIAGNOSIS — Z7982 Long term (current) use of aspirin: Secondary | ICD-10-CM | POA: Diagnosis not present

## 2021-07-23 DIAGNOSIS — Z87440 Personal history of urinary (tract) infections: Secondary | ICD-10-CM | POA: Diagnosis not present

## 2021-07-23 DIAGNOSIS — M1990 Unspecified osteoarthritis, unspecified site: Secondary | ICD-10-CM | POA: Diagnosis not present

## 2021-07-23 DIAGNOSIS — F32A Depression, unspecified: Secondary | ICD-10-CM | POA: Diagnosis not present

## 2021-07-23 MED ORDER — ROSUVASTATIN CALCIUM 20 MG PO TABS: ORAL_TABLET | ORAL | 6 refills | Status: DC

## 2021-07-23 MED ORDER — ALLOPURINOL 100 MG PO TABS: 100.0000 mg | ORAL_TABLET | Freq: Every day | ORAL | 6 refills | Status: DC

## 2021-07-23 MED ORDER — CLOPIDOGREL BISULFATE 75 MG PO TABS: 75.0000 mg | ORAL_TABLET | Freq: Every day | ORAL | 6 refills | Status: DC

## 2021-07-24 ENCOUNTER — Ambulatory Visit (INDEPENDENT_AMBULATORY_CARE_PROVIDER_SITE_OTHER): Payer: Medicare Other | Admitting: Legal Medicine

## 2021-07-24 ENCOUNTER — Other Ambulatory Visit: Payer: Self-pay

## 2021-07-24 ENCOUNTER — Encounter: Payer: Self-pay | Admitting: Legal Medicine

## 2021-07-24 VITALS — BP 146/82 | HR 73 | Temp 97.6°F | Resp 20 | Ht 68.0 in | Wt 300.4 lb

## 2021-07-24 DIAGNOSIS — G4733 Obstructive sleep apnea (adult) (pediatric): Secondary | ICD-10-CM | POA: Diagnosis not present

## 2021-07-24 DIAGNOSIS — I6930 Unspecified sequelae of cerebral infarction: Secondary | ICD-10-CM | POA: Diagnosis not present

## 2021-07-24 DIAGNOSIS — I25118 Atherosclerotic heart disease of native coronary artery with other forms of angina pectoris: Secondary | ICD-10-CM

## 2021-07-24 DIAGNOSIS — F1910 Other psychoactive substance abuse, uncomplicated: Secondary | ICD-10-CM

## 2021-07-24 DIAGNOSIS — F17200 Nicotine dependence, unspecified, uncomplicated: Secondary | ICD-10-CM

## 2021-07-24 DIAGNOSIS — I10 Essential (primary) hypertension: Secondary | ICD-10-CM | POA: Diagnosis not present

## 2021-07-24 DIAGNOSIS — E1142 Type 2 diabetes mellitus with diabetic polyneuropathy: Secondary | ICD-10-CM

## 2021-07-24 DIAGNOSIS — Z6841 Body Mass Index (BMI) 40.0 and over, adult: Secondary | ICD-10-CM

## 2021-07-24 DIAGNOSIS — I5041 Acute combined systolic (congestive) and diastolic (congestive) heart failure: Secondary | ICD-10-CM | POA: Diagnosis not present

## 2021-07-24 HISTORY — DX: Unspecified sequelae of cerebral infarction: I69.30

## 2021-07-24 MED ORDER — ACCU-CHEK GUIDE W/DEVICE KIT: 1.0000 | PACK | Freq: Three times a day (TID) | 0 refills | Status: DC

## 2021-07-24 NOTE — Progress Notes (Signed)
Established Patient Office Visit  Subjective:  Patient ID: Brent Ortiz, male    DOB: 03/10/1961  Age: 61 y.o. 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  He was stabilized in the hospital and discharged on the 17th of the month.  Patient still has some weakness on his left arm but is able to ambulate he is working on trying to quit smoking and is set up for physical therapy at the hospital for this.  He denies any recent cocaine use.  We still need to set him up to see a neurologist as for follow-up we will try to do it in [facility].  Past Medical History:  Diagnosis Date   Acute combined systolic and diastolic CHF, NYHA class 2 ([place]) 01/22/2020   Alcohol abuse    Atherosclerosis of coronary artery of native heart with stable angina pectoris, unspecified vessel or lesion type ([place]) 01/23/2016   Overview:  Completely occluded circumflex artery proximal to obtuse marginal 1 basin cardiac catheter from 2017   Chronic kidney disease    Chronic pain 10/19/2019   Sees pain clinic   COPD (chronic obstructive pulmonary disease) ([place])    Coronary artery disease involving native heart 02/08/2020   Coronary atherosclerosis of native coronary artery 01/23/2016   Overview:  Completely occluded circumflex artery proximal to obtuse marginal 1 basin cardiac catheter from 2017   Depression, major, single episode, moderate ([place]) 07/11/2020   Diabetic polyneuropathy ([place]) 12/06/2019   ED (erectile dysfunction) 12/06/2019   Essential hypertension 01/23/2016   Essential thrombocythemia ([place]) 09/29/2011   GERD without esophagitis    HFrEF (heart failure with reduced ejection fraction)  ([place]) 02/08/2020   Knee pain, left 02/12/2012   Formatting of this note might be different from the original. Left   Leukocytosis 09/04/2011   Mixed hyperlipidemia 06/18/2017   Morbid obesity ([place]) 11/21/2020   Morbid obesity with BMI of 40.0-44.9, adult ([place]) 01/13/2013   Myocardial infarction ([place])    04-13-2011   Obstructive chronic bronchitis with exacerbation ([place]) 10/03/2019   Opiate abuse, continuous (HCC)    Osteoarthritis    Pain in knee joint 02/12/2012   Formatting of this note might be different from the original. Left   Pneumonia    Pneumonia due to infectious organism 09/04/2011   Polysubstance abuse ([place])    Poor dentition    Prostatitis 12/04/2020   Thrombocytosis 09/29/2011   Tobacco dependence 01/23/2016   Type 2 diabetes mellitus with diabetic polyneuropathy [facility])     Past Surgical History:  Procedure Laterality Date   HERNIA REPAIR  2009   I & D KNEE WITH POLY EXCHANGE  09/03/2011   Procedure: IRRIGATION AND DEBRIDEMENT KNEE WITH POLY EXCHANGE;  Surgeon: Mauri Pole, MD;  Location: [facility];  Service: Orthopedics;  Laterality: Left;   TOOTH EXTRACTION Bilateral 08/06/2017   Procedure: DENTAL RESTORATION/EXTRACTIONS;  Surgeon: Diona Browner, DDS;  Location: [place];  Service: Oral Surgery;  Laterality: Bilateral;   TOTAL KNEE ARTHROPLASTY   right  feb 2012   left march 2012 r   TOTAL KNEE REVISION  06/23/2011   Procedure: TOTAL KNEE REVISION;  Surgeon: Mauri Pole;  Location: [facility];  Service: Orthopedics;  Laterality: Left;  femomal nerve block done in holding area without incident    Family History  Problem Relation Age of Onset   Colon cancer Other    Colon cancer Mother     Social History   Socioeconomic History   Marital status: Widowed    Spouse name: Not on file   Number of children: 5   Years of education: Not on file   Highest education level: Not on file  Occupational History   Occupation: Disabled  Tobacco Use   Smoking status: Every  Day    Packs/day: 1.00    Years: 50.00    Pack years: 50.00    Types: Cigarettes   Smokeless tobacco: Never  Vaping Use   Vaping Use: Former  Substance and Sexual Activity   Alcohol use: No    Comment: heavy drinker in past    Drug use: No   Sexual activity: Not Currently  Other Topics Concern   Not on file  Social History Narrative   Not on file   Social Determinants of Health   Financial Resource Strain: Not on file  Food Insecurity: Not on file  Transportation Needs: Not on file  Physical Activity: Not on file  Stress: Not on file  Social Connections: Not on file  Intimate Partner Violence: Not on file    Outpatient Medications Prior to Visit  Medication Sig Dispense Refill   acetaminophen (TYLENOL) 325 MG tablet Take 650 mg by mouth every 4 (four) hours as needed for mild pain.     albuterol (VENTOLIN HFA) 108 (90 Base) MCG/ACT inhaler INHALE 2 PUFFS INTO THE LUNGS EVERY 4 HOURS AS NEEDED FOR WHEEZING OR SHORTNESS OF BREATH. 8.5 g 6   allopurinol (ZYLOPRIM) 100 MG tablet Take 1 tablet (100 mg total) by mouth daily. 30 tablet 6   amLODipine (NORVASC) 5 MG tablet Take 5 mg by mouth daily.     B-D ULTRAFINE III SHORT PEN 31G X 8 MM MISC SMARTSIG:Injection Daily     celecoxib (CELEBREX) 200 MG capsule Take 1 capsule (200 mg total) by mouth 2 (two) times daily. 60 capsule 5   clopidogrel (PLAVIX) 75 MG tablet Take 1 tablet (75 mg total) by mouth daily. 30 tablet 6   colchicine-probenecid 0.5-500 MG tablet Take 1 tablet by mouth 2 (two) times daily as needed.     diclofenac Sodium (VOLTAREN) 1 % GEL Apply 2 g topically 4 (four) times daily.     ENTRESTO 24-26 MG TAKE 1 TABLET BY MOUTH 2 TIMES DAILY.(OBLONG PINK TAB WITH LZ NVR) 180 tablet 1   ezetimibe (ZETIA) 10 MG tablet Take 10 mg by mouth daily.     gabapentin (NEURONTIN) 300 MG capsule TAKE 1 CAPSULE ($RemoveBe'300MG'dXHEyGqJq$ ) BY MOUTH TWICE DAILY.(YELLOW CAP) 186 capsule 3   Glucagon, rDNA, (GLUCAGON EMERGENCY) 1 MG KIT Inject 1 mg into  the muscle as needed (BG less than 70).     GOODSENSE ASPIRIN LOW DOSE 81 MG EC tablet TAKE 1 TABLET ($RemoveB'81MG'dmQhZoHV$ ) BY MOUTH DAILY. SWALLOW WHOLE.(YELLOW ROUND TAB WITH HEART) 90 tablet 2   isosorbide mononitrate (IMDUR) 60 MG 24 hr tablet Take 60 mg by mouth daily.     LANTUS SOLOSTAR 100 UNIT/ML Solostar Pen ADMINISTER 30 UNITS UNDER THE SKIN DAILY 15 mL 2   meclizine (ANTIVERT) 12.5 MG tablet Take 12.5 mg by mouth 3 (three) times daily as needed for dizziness.     metFORMIN (GLUCOPHAGE) 500 MG tablet TAKE  1 TABLET BY MOUTH EVERY DAY WITH SUPPER.(ROUND WHITE TAB WITH H 102) 90 tablet 2   metoprolol succinate (TOPROL-XL) 25 MG 24 hr tablet TAKE 1 TABLET (25 MG TOTAL) BY MOUTH DAILY.(OVAL WHITE TAB WITH E7) 31 tablet 3   nicotine (NICODERM CQ - DOSED IN MG/24 HOURS) 21 mg/24hr patch 21 mg daily.     nicotine (NICODERM CQ - DOSED IN MG/24 HR) 7 mg/24hr patch APPLY 1 PATCH ONTO THE SKIN ONCE DAILY ** DISCARD PROPERLY 30 patch 3   nitroGLYCERIN (NITROSTAT) 0.4 MG SL tablet Place 1 tablet (0.4 mg total) under the tongue every 5 (five) minutes as needed. For chest pain 50 tablet 3   pantoprazole (PROTONIX) 40 MG tablet TAKE 1 TABLET($RemoveBefor'40MG'SMCyUdAJKobp$ ) BY MOUTH DAILY.(OVALYELLOW TAB WITH H126) 90 tablet 2   polyethylene glycol (MIRALAX / GLYCOLAX) 17 g packet Take 17 g by mouth daily.     potassium chloride SA (KLOR-CON M) 20 MEQ tablet TAKE 1 TABLET(20MEQ TOTAL) BY MOUTH 4 TIMES DAILY.(OBLONG WHITE TAB WITH KC M20 OR WITH G20M) 124 tablet 0   ranolazine (RANEXA) 500 MG 12 hr tablet TAKE 1 TABLET BY MOUTH 2 TIMES DAILY (PEACH COLORED TABLET WITH C49 OR 588) 186 tablet 0   rosuvastatin (CRESTOR) 20 MG tablet TAKE 1 TABLET ($RemoveB'20MG'qJLqLfUt$  TOTAL) BY MOUTH DAILY.(ROUND PINK TAB WITH R20) 30 tablet 6   sertraline (ZOLOFT) 25 MG tablet Take 1 tablet (25 mg total) by mouth daily. 90 tablet 2   spironolactone (ALDACTONE) 25 MG tablet TAKE 1/2 TABLET(12.5) BY MOUTH DAILY. (HALF TAN TAB) 30 tablet 3   SYMBICORT 160-4.5 MCG/ACT inhaler INHALE 2  PUFFS INTO THE LUNGS TWICE DAILY. 30.6 g 2   tamsulosin (FLOMAX) 0.4 MG CAPS capsule TAKE 1 CAPSULE (.$RemoveBef'4MG'utUWCLcYTn$  TOTAL) BY MOUTH DAILY.(TAN AND GOLD CAPSULE WITH CL23 0.4) 93 capsule 3   traZODone (DESYREL) 150 MG tablet Take 1 tablet (150 mg total) by mouth at bedtime. 90 tablet 2   No facility-administered medications prior to visit.    Allergies  Allergen Reactions   Motrin [Ibuprofen] Other (See Comments)    Reaction: ulcers   Other Nausea And Vomiting    Patient has ulcers    ROS Review of Systems  Constitutional:  Positive for activity change.  HENT:  Negative for congestion.   Eyes:  Negative for visual disturbance.  Respiratory:  Negative for chest tightness and shortness of breath.   Cardiovascular:  Negative for chest pain and palpitations.  Gastrointestinal:  Negative for abdominal distention and abdominal pain.  Endocrine: Negative for polyuria.  Genitourinary:  Negative for difficulty urinating.  Musculoskeletal:  Negative for arthralgias.  Neurological:  Positive for weakness.  Psychiatric/Behavioral:         History of substance abuse     Objective:    Physical Exam Vitals reviewed.  Constitutional:      General: He is in acute distress.     Appearance: Normal appearance. He is obese.  HENT:     Right Ear: Tympanic membrane normal.     Left Ear: Tympanic membrane normal.  Eyes:     Extraocular Movements: Extraocular movements intact.     Conjunctiva/sclera: Conjunctivae normal.     Pupils: Pupils are equal, round, and reactive to light.  Cardiovascular:     Rate and Rhythm: Normal rate and regular rhythm.     Pulses: Normal pulses.     Heart sounds: No murmur heard.   No gallop.  Pulmonary:     Effort: Pulmonary effort is normal.  No respiratory distress.     Breath sounds: No wheezing.  Abdominal:     General: Abdomen is flat. Bowel sounds are normal. There is no distension.     Tenderness: There is no abdominal tenderness.  Musculoskeletal:      Cervical back: Normal range of motion and neck supple.     Right lower leg: No edema.     Left lower leg: No edema.     Comments: Left UE weakness and tingling and left facial droop  Skin:    General: Skin is warm.     Capillary Refill: Capillary refill takes less than 2 seconds.  Neurological:     Mental Status: He is alert.     Motor: Weakness present.     Gait: Gait abnormal.    BP (!) 146/82 (BP Location: Left Arm, Patient Position: Sitting, Cuff Size: Large)    Pulse 73    Temp 97.6 F (36.4 C)    Resp 20    Ht $R'5\' 8"'iJ$  (1.727 m)    Wt (!) 300 lb 6.4 oz (136.3 kg)    SpO2 94%    BMI 45.68 kg/m  Wt Readings from Last 3 Encounters:  07/24/21 (!) 300 lb 6.4 oz (136.3 kg)  05/27/21 290 lb (131.5 kg)  01/24/21 (!) 302 lb (137 kg)     Health Maintenance Due  Topic Date Due   HIV Screening  Never done   Hepatitis C Screening  Never done   TETANUS/TDAP  Never done   Zoster Vaccines- Shingrix (2 of 2) 07/02/2019   COVID-19 Vaccine (4 - Booster for Pfizer series) 09/07/2020    There are no preventive care reminders to display for this patient.  Lab Results  Component Value Date   TSH 0.719 01/24/2021   Lab Results  Component Value Date   WBC 8.6 05/27/2021   HGB 14.9 05/27/2021   HCT 43.7 05/27/2021   MCV 92 05/27/2021   PLT 337 05/27/2021   Lab Results  Component Value Date   NA 138 05/27/2021   K 5.0 05/27/2021   CO2 20 05/27/2021   GLUCOSE 119 (H) 05/27/2021   BUN 18 05/27/2021   CREATININE 1.36 (H) 05/27/2021   BILITOT 0.9 05/27/2021   ALKPHOS 61 05/27/2021   AST 19 05/27/2021   ALT 13 05/27/2021   PROT 6.7 05/27/2021   ALBUMIN 4.5 05/27/2021   CALCIUM 9.3 05/27/2021   ANIONGAP 11 08/06/2017   EGFR 59 (L) 05/27/2021   Lab Results  Component Value Date   CHOL 204 (H) 05/27/2021   Lab Results  Component Value Date   HDL 52 05/27/2021   Lab Results  Component Value Date   LDLCALC 99 05/27/2021   Lab Results  Component Value Date   TRIG 313 (H)  05/27/2021   Lab Results  Component Value Date   CHOLHDL 3.9 05/27/2021   Lab Results  Component Value Date   HGBA1C 6.8 (H) 05/27/2021      Assessment & Plan:   Problem List Items Addressed This Visit       Cardiovascular and Mediastinum History of stroke with residual effects    -  Primary  Relevant Orders  Ambulatory referral to Neurology Patient is seen and has transition of care after having a right temporal stroke he still has residual numbness and tingling in the left side of his face and in his left arm.  We will be referring him to neurology and he is already set up for physical  therapy at the hospital for this.  The need to watch his blood pressure closely and may need to continue to just admitted this up today due to stress.      Atherosclerosis of coronary artery of native heart with stable angina pectoris, unspecified vessel or lesion type [facility]) Patient has a history of coronary artery disease but had no recent chest pain he also has had congestive heart failure with reduced ejection fraction.    Essential hypertension Blood pressure is elevated today and he has taken his medicines we discussed the need to continue his medicines closely and follow his blood pressures which we may need to adjust.   Acute combined systolic and diastolic CHF, NYHA class 2 ([place]) Patient has a history of combined systolic and diastolic heart failure presently is having no symptoms no chest pains.     Respiratory   Obstructive sleep apnea   Relevant Orders   Ambulatory referral to Pulmonology Patient has obstructive sleep apnea and has lost his CPAP machine we have referred him for repeat sleep test he has not conformed we will refer him to Dr. Kathi Ludwig for the sleep study.     Endocrine   Diabetic polyneuropathy (HCC)   Relevant Medications   Blood Glucose Monitoring Suppl (ACCU-CHEK GUIDE) w/Device KIT Patient has moderately controlled diabetes but he is taking his medicines  regularly.  I sent him and an Accu-Chek to test 3 times a day     Other   Polysubstance abuse [facility]) Patient has history of polysubstance abuse in the past and was narcotics which he was snorting he has gone through Suboxone treatment as well as chronic pain clinic which she fails several times.  His recent history as he has been taking cocaine which may have led to his stroke.   BMI 45.0-49.9, adult ([place])   Tobacco dependence Patient is continue to be tobacco dependent smoking about a pack a day he had been on several treatments to attempt to quit smoking but he is having a hard time doing it he is working with member at the hospital try to cut down 1 cigarette a day.      Personal history of stroke with residual effects Patient had recent stroke as noted above.   Other Visit Diagnoses                  Meds ordered this encounter  Medications   Blood Glucose Monitoring Suppl (ACCU-CHEK GUIDE) w/Device KIT    Sig: 1 each by Does not apply route in the morning, at noon, and at bedtime.    Dispense:  1 kit    Refill:  0    Follow-up: Return as scheduled.    Reinaldo Meeker, MD

## 2021-07-28 ENCOUNTER — Other Ambulatory Visit: Payer: Self-pay

## 2021-07-28 DIAGNOSIS — G8929 Other chronic pain: Secondary | ICD-10-CM | POA: Diagnosis not present

## 2021-07-28 DIAGNOSIS — Z7902 Long term (current) use of antithrombotics/antiplatelets: Secondary | ICD-10-CM | POA: Diagnosis not present

## 2021-07-28 DIAGNOSIS — F17210 Nicotine dependence, cigarettes, uncomplicated: Secondary | ICD-10-CM | POA: Diagnosis not present

## 2021-07-28 DIAGNOSIS — M1990 Unspecified osteoarthritis, unspecified site: Secondary | ICD-10-CM | POA: Diagnosis not present

## 2021-07-28 DIAGNOSIS — Z87440 Personal history of urinary (tract) infections: Secondary | ICD-10-CM | POA: Diagnosis not present

## 2021-07-28 DIAGNOSIS — E7800 Pure hypercholesterolemia, unspecified: Secondary | ICD-10-CM | POA: Diagnosis not present

## 2021-07-28 DIAGNOSIS — I2510 Atherosclerotic heart disease of native coronary artery without angina pectoris: Secondary | ICD-10-CM | POA: Diagnosis not present

## 2021-07-28 DIAGNOSIS — Z7982 Long term (current) use of aspirin: Secondary | ICD-10-CM | POA: Diagnosis not present

## 2021-07-28 DIAGNOSIS — M109 Gout, unspecified: Secondary | ICD-10-CM | POA: Diagnosis not present

## 2021-07-28 DIAGNOSIS — K219 Gastro-esophageal reflux disease without esophagitis: Secondary | ICD-10-CM | POA: Diagnosis not present

## 2021-07-28 DIAGNOSIS — E1165 Type 2 diabetes mellitus with hyperglycemia: Secondary | ICD-10-CM | POA: Diagnosis not present

## 2021-07-28 DIAGNOSIS — I252 Old myocardial infarction: Secondary | ICD-10-CM | POA: Diagnosis not present

## 2021-07-28 DIAGNOSIS — D7589 Other specified diseases of blood and blood-forming organs: Secondary | ICD-10-CM | POA: Diagnosis not present

## 2021-07-28 DIAGNOSIS — F32A Depression, unspecified: Secondary | ICD-10-CM | POA: Diagnosis not present

## 2021-07-28 DIAGNOSIS — I5020 Unspecified systolic (congestive) heart failure: Secondary | ICD-10-CM | POA: Diagnosis not present

## 2021-07-28 DIAGNOSIS — I110 Hypertensive heart disease with heart failure: Secondary | ICD-10-CM | POA: Diagnosis not present

## 2021-07-28 DIAGNOSIS — Z87442 Personal history of urinary calculi: Secondary | ICD-10-CM | POA: Diagnosis not present

## 2021-07-28 DIAGNOSIS — J439 Emphysema, unspecified: Secondary | ICD-10-CM | POA: Diagnosis not present

## 2021-07-28 DIAGNOSIS — Z7984 Long term (current) use of oral hypoglycemic drugs: Secondary | ICD-10-CM | POA: Diagnosis not present

## 2021-07-28 MED ORDER — ACCU-CHEK GUIDE VI STRP: ORAL_STRIP | 5 refills | Status: DC

## 2021-07-28 MED ORDER — ACCU-CHEK SOFTCLIX LANCETS MISC: 5 refills | Status: AC

## 2021-07-31 ENCOUNTER — Other Ambulatory Visit: Payer: Self-pay | Admitting: Legal Medicine

## 2021-08-01 DIAGNOSIS — M109 Gout, unspecified: Secondary | ICD-10-CM | POA: Diagnosis not present

## 2021-08-01 DIAGNOSIS — G8929 Other chronic pain: Secondary | ICD-10-CM | POA: Diagnosis not present

## 2021-08-01 DIAGNOSIS — F32A Depression, unspecified: Secondary | ICD-10-CM | POA: Diagnosis not present

## 2021-08-01 DIAGNOSIS — I110 Hypertensive heart disease with heart failure: Secondary | ICD-10-CM | POA: Diagnosis not present

## 2021-08-01 DIAGNOSIS — F17210 Nicotine dependence, cigarettes, uncomplicated: Secondary | ICD-10-CM | POA: Diagnosis not present

## 2021-08-01 DIAGNOSIS — Z87442 Personal history of urinary calculi: Secondary | ICD-10-CM | POA: Diagnosis not present

## 2021-08-01 DIAGNOSIS — Z87440 Personal history of urinary (tract) infections: Secondary | ICD-10-CM | POA: Diagnosis not present

## 2021-08-01 DIAGNOSIS — I2510 Atherosclerotic heart disease of native coronary artery without angina pectoris: Secondary | ICD-10-CM | POA: Diagnosis not present

## 2021-08-01 DIAGNOSIS — Z7902 Long term (current) use of antithrombotics/antiplatelets: Secondary | ICD-10-CM | POA: Diagnosis not present

## 2021-08-01 DIAGNOSIS — Z7982 Long term (current) use of aspirin: Secondary | ICD-10-CM | POA: Diagnosis not present

## 2021-08-01 DIAGNOSIS — I252 Old myocardial infarction: Secondary | ICD-10-CM | POA: Diagnosis not present

## 2021-08-01 DIAGNOSIS — I5020 Unspecified systolic (congestive) heart failure: Secondary | ICD-10-CM | POA: Diagnosis not present

## 2021-08-01 DIAGNOSIS — D7589 Other specified diseases of blood and blood-forming organs: Secondary | ICD-10-CM | POA: Diagnosis not present

## 2021-08-01 DIAGNOSIS — M1990 Unspecified osteoarthritis, unspecified site: Secondary | ICD-10-CM | POA: Diagnosis not present

## 2021-08-01 DIAGNOSIS — E7800 Pure hypercholesterolemia, unspecified: Secondary | ICD-10-CM | POA: Diagnosis not present

## 2021-08-01 DIAGNOSIS — Z7984 Long term (current) use of oral hypoglycemic drugs: Secondary | ICD-10-CM | POA: Diagnosis not present

## 2021-08-01 DIAGNOSIS — K219 Gastro-esophageal reflux disease without esophagitis: Secondary | ICD-10-CM | POA: Diagnosis not present

## 2021-08-01 DIAGNOSIS — E1165 Type 2 diabetes mellitus with hyperglycemia: Secondary | ICD-10-CM | POA: Diagnosis not present

## 2021-08-01 DIAGNOSIS — J439 Emphysema, unspecified: Secondary | ICD-10-CM | POA: Diagnosis not present

## 2021-08-05 DIAGNOSIS — I5020 Unspecified systolic (congestive) heart failure: Secondary | ICD-10-CM | POA: Diagnosis not present

## 2021-08-05 DIAGNOSIS — D7589 Other specified diseases of blood and blood-forming organs: Secondary | ICD-10-CM | POA: Diagnosis not present

## 2021-08-05 DIAGNOSIS — R5383 Other fatigue: Secondary | ICD-10-CM | POA: Diagnosis not present

## 2021-08-05 DIAGNOSIS — F1410 Cocaine abuse, uncomplicated: Secondary | ICD-10-CM | POA: Diagnosis not present

## 2021-08-05 DIAGNOSIS — J4540 Moderate persistent asthma, uncomplicated: Secondary | ICD-10-CM | POA: Diagnosis not present

## 2021-08-05 DIAGNOSIS — F17210 Nicotine dependence, cigarettes, uncomplicated: Secondary | ICD-10-CM | POA: Diagnosis not present

## 2021-08-05 DIAGNOSIS — G4733 Obstructive sleep apnea (adult) (pediatric): Secondary | ICD-10-CM | POA: Diagnosis not present

## 2021-08-06 ENCOUNTER — Other Ambulatory Visit: Payer: Self-pay

## 2021-08-06 MED ORDER — ROSUVASTATIN CALCIUM 40 MG PO TABS: 40.0000 mg | ORAL_TABLET | Freq: Every day | ORAL | 2 refills | Status: DC

## 2021-08-07 ENCOUNTER — Other Ambulatory Visit: Payer: Self-pay | Admitting: Family Medicine

## 2021-08-07 NOTE — Telephone Encounter (Signed)
Refill sent to pharmacy.   

## 2021-08-14 ENCOUNTER — Telehealth: Payer: Self-pay

## 2021-08-14 NOTE — Chronic Care Management (AMB) (Signed)
Chronic Care Management Pharmacy Assistant   Name: Brent Ortiz  MRN: [id number] DOB: [birth year]  Reason for Encounter: Disease State/ General  Recent office visits:  07-24-2021 [name], MD. Referral placed to neurology and Pulmonology.  05-27-2021 [name], MD. Glucose= 119, Creatinine= 1.36, eGFR= 59. A1C= 6.8. Cholesterol= 204, Trig= 313, VLDL= 53. START Celebrex 200 mg twice daily. INCREASE Lantus 20 units daily TO 30 units daily. STOP indocin, meloxicam and percocet.  Recent consult visits:  06-06-2021 [name], DPM (Podiatry). "Re-discussed and educated patient on diabetic foot care, especially with  regards to the vascular, neurological and musculoskeletal systems. Mechanically debrided all nails 1-5 bilateral using sterile nail nipper and filed with dremel without incident. Encourage skin emollients. Follow up in 3 months".   Hospital visits:  None in previous 6 months  Medications: Outpatient Encounter Medications as of 08/14/2021  Medication Sig   Accu-Chek Softclix Lancets lancets Use as instructed   acetaminophen (TYLENOL) 325 MG tablet Take 650 mg by mouth every 4 (four) hours as needed for mild pain.   albuterol (VENTOLIN HFA) 108 (90 Base) MCG/ACT inhaler INHALE 2 PUFFS INTO THE LUNGS EVERY 4 HOURS AS NEEDED FOR WHEEZING OR SHORTNESS OF BREATH.   allopurinol (ZYLOPRIM) 100 MG tablet Take 1 tablet (100 mg total) by mouth daily.   amLODipine (NORVASC) 5 MG tablet Take 5 mg by mouth daily.   B-D ULTRAFINE III SHORT PEN 31G X 8 MM MISC USE AS DIRECTED   Blood Glucose Monitoring Suppl (ACCU-CHEK GUIDE) w/Device KIT 1 each by Does not apply route in the morning, at noon, and at bedtime.   celecoxib (CELEBREX) 200 MG capsule Take 1 capsule (200 mg total) by mouth 2 (two) times daily.   clopidogrel (PLAVIX) 75 MG tablet Take 1 tablet (75 mg total) by mouth daily.   colchicine-probenecid 0.5-500 MG tablet Take 1 tablet by mouth 2 (two)  times daily as needed.   diclofenac Sodium (VOLTAREN) 1 % GEL Apply 2 g topically 4 (four) times daily.   ENTRESTO 24-26 MG TAKE 1 TABLET BY MOUTH 2 TIMES DAILY.(OBLONG PINK TAB WITH LZ NVR)   ezetimibe (ZETIA) 10 MG tablet Take 10 mg by mouth daily.   gabapentin (NEURONTIN) 300 MG capsule TAKE 1 CAPSULE (300MG) BY MOUTH TWICE DAILY.(YELLOW CAP)   Glucagon, rDNA, (GLUCAGON EMERGENCY) 1 MG KIT Inject 1 mg into the muscle as needed (BG less than 70).   glucose blood (ACCU-CHEK GUIDE) test strip Use as instructed   GOODSENSE ASPIRIN LOW DOSE 81 MG EC tablet TAKE 1 TABLET (81MG) BY MOUTH DAILY. SWALLOW WHOLE.(YELLOW ROUND TAB WITH HEART)   isosorbide mononitrate (IMDUR) 60 MG 24 hr tablet Take 60 mg by mouth daily.   LANTUS SOLOSTAR 100 UNIT/ML Solostar Pen ADMINISTER 30 UNITS UNDER THE SKIN DAILY   meclizine (ANTIVERT) 12.5 MG tablet Take 12.5 mg by mouth 3 (three) times daily as needed for dizziness.   metFORMIN (GLUCOPHAGE) 500 MG tablet TAKE 1 TABLET BY MOUTH EVERY DAY WITH SUPPER.(ROUND WHITE TAB WITH H 102)   metoprolol succinate (TOPROL-XL) 25 MG 24 hr tablet TAKE 1 TABLET (25 MG TOTAL) BY MOUTH DAILY.(OVAL WHITE TAB WITH E7)   nicotine (NICODERM CQ - DOSED IN MG/24 HOURS) 21 mg/24hr patch 21 mg daily.   nicotine (NICODERM CQ - DOSED IN MG/24 HR) 7 mg/24hr patch APPLY 1 PATCH ONTO THE SKIN ONCE DAILY ** DISCARD PROPERLY   nitroGLYCERIN (NITROSTAT) 0.4 MG SL tablet Place 1 tablet (0.4 mg  total) under the tongue every 5 (five) minutes as needed. For chest pain   pantoprazole (PROTONIX) 40 MG tablet TAKE 1 TABLET(40MG) BY MOUTH DAILY.(OVALYELLOW TAB WITH H126)   polyethylene glycol (MIRALAX / GLYCOLAX) 17 g packet Take 17 g by mouth daily.   potassium chloride SA (KLOR-CON M) 20 MEQ tablet TAKE 1 TABLET(20MEQ TOTAL) BY MOUTH 4 TIMES DAILY.(OBLONG WHITE TAB WITH KC M20 OR WITH G20M)   ranolazine (RANEXA) 500 MG 12 hr tablet TAKE 1 TABLET BY MOUTH 2 TIMES DAILY (PEACH COLORED TABLET WITH C49 OR 588)    rosuvastatin (CRESTOR) 40 MG tablet Take 1 tablet (40 mg total) by mouth daily.   sertraline (ZOLOFT) 25 MG tablet Take 1 tablet (25 mg total) by mouth daily.   spironolactone (ALDACTONE) 25 MG tablet TAKE 1/2 TABLET(12.5) BY MOUTH DAILY. (HALF TAN TAB)   SYMBICORT 160-4.5 MCG/ACT inhaler INHALE 2 PUFFS INTO THE LUNGS TWICE DAILY.   tamsulosin (FLOMAX) 0.4 MG CAPS capsule TAKE 1 CAPSULE (.4MG TOTAL) BY MOUTH DAILY.(TAN AND GOLD CAPSULE WITH CL23 0.4)   traZODone (DESYREL) 150 MG tablet Take 1 tablet (150 mg total) by mouth at bedtime.   No facility-administered encounter medications on file as of 08/14/2021.  Recent Relevant Labs: Lab Results  Component Value Date/Time   HGBA1C 6.8 (H) 05/27/2021 10:36 AM   HGBA1C 7.6 (H) 01/24/2021 09:19 AM   MICROALBUR 80 09/27/2020 10:46 AM    Kidney Function Lab Results  Component Value Date/Time   CREATININE 1.36 (H) 05/27/2021 10:36 AM   CREATININE 1.16 01/24/2021 09:19 AM   CREATININE 0.87 09/29/2011 05:00 PM   GFRNONAA 68 06/26/2020 03:00 PM   GFRNONAA >89 09/29/2011 05:00 PM   GFRAA 78 06/26/2020 03:00 PM   GFRAA >89 09/29/2011 05:00 PM   Lipid Panel    Component Value Date/Time   CHOL 204 (H) 05/27/2021 1036   TRIG 313 (H) 05/27/2021 1036   HDL 52 05/27/2021 1036   LDLCALC 99 05/27/2021 1036    08-14-2021: 1st attempt left VM 08-22-2021: 2nd attempt left VM 08-26-2021: 3rd attempt left VM  Care Gaps: Last eye exam / Retinopathy Screening?  Last Annual Wellness Visit? Missed 07-02-2021 Last Diabetic Foot Exam? 06-06-2021  Star Rating Drugs: Rosuvastatin 20 mg- Last filled 07-23-2021 30 DS Metfromin 500 mg- Last filled 08-06-2020 30 DS Entresto 24-26 mg- Last filled 08-06-2021 30 DS  [place] Clinical Pharmacist Assistant [phone]

## 2021-08-18 DIAGNOSIS — F17210 Nicotine dependence, cigarettes, uncomplicated: Secondary | ICD-10-CM | POA: Diagnosis not present

## 2021-08-18 DIAGNOSIS — Z122 Encounter for screening for malignant neoplasm of respiratory organs: Secondary | ICD-10-CM | POA: Diagnosis not present

## 2021-08-18 DIAGNOSIS — Z87891 Personal history of nicotine dependence: Secondary | ICD-10-CM | POA: Diagnosis not present

## 2021-08-19 DIAGNOSIS — K219 Gastro-esophageal reflux disease without esophagitis: Secondary | ICD-10-CM | POA: Diagnosis not present

## 2021-08-19 DIAGNOSIS — Z8673 Personal history of transient ischemic attack (TIA), and cerebral infarction without residual deficits: Secondary | ICD-10-CM | POA: Diagnosis not present

## 2021-08-19 DIAGNOSIS — I208 Other forms of angina pectoris: Secondary | ICD-10-CM | POA: Diagnosis not present

## 2021-08-19 DIAGNOSIS — J449 Chronic obstructive pulmonary disease, unspecified: Secondary | ICD-10-CM | POA: Diagnosis not present

## 2021-08-19 DIAGNOSIS — I5022 Chronic systolic (congestive) heart failure: Secondary | ICD-10-CM | POA: Diagnosis not present

## 2021-08-19 DIAGNOSIS — I110 Hypertensive heart disease with heart failure: Secondary | ICD-10-CM | POA: Diagnosis not present

## 2021-08-21 DIAGNOSIS — G4733 Obstructive sleep apnea (adult) (pediatric): Secondary | ICD-10-CM | POA: Diagnosis not present

## 2021-08-22 DIAGNOSIS — I110 Hypertensive heart disease with heart failure: Secondary | ICD-10-CM | POA: Diagnosis not present

## 2021-08-22 DIAGNOSIS — I5022 Chronic systolic (congestive) heart failure: Secondary | ICD-10-CM | POA: Diagnosis not present

## 2021-08-22 DIAGNOSIS — J449 Chronic obstructive pulmonary disease, unspecified: Secondary | ICD-10-CM | POA: Diagnosis not present

## 2021-08-22 DIAGNOSIS — G4733 Obstructive sleep apnea (adult) (pediatric): Secondary | ICD-10-CM | POA: Diagnosis not present

## 2021-08-22 DIAGNOSIS — I208 Other forms of angina pectoris: Secondary | ICD-10-CM | POA: Diagnosis not present

## 2021-08-22 DIAGNOSIS — K219 Gastro-esophageal reflux disease without esophagitis: Secondary | ICD-10-CM | POA: Diagnosis not present

## 2021-08-22 DIAGNOSIS — Z8673 Personal history of transient ischemic attack (TIA), and cerebral infarction without residual deficits: Secondary | ICD-10-CM | POA: Diagnosis not present

## 2021-08-28 DIAGNOSIS — R5383 Other fatigue: Secondary | ICD-10-CM | POA: Diagnosis not present

## 2021-08-28 DIAGNOSIS — F17210 Nicotine dependence, cigarettes, uncomplicated: Secondary | ICD-10-CM | POA: Diagnosis not present

## 2021-08-28 DIAGNOSIS — G4733 Obstructive sleep apnea (adult) (pediatric): Secondary | ICD-10-CM | POA: Diagnosis not present

## 2021-08-28 DIAGNOSIS — J4540 Moderate persistent asthma, uncomplicated: Secondary | ICD-10-CM | POA: Diagnosis not present

## 2021-09-04 ENCOUNTER — Other Ambulatory Visit: Payer: Self-pay | Admitting: Legal Medicine

## 2021-09-04 DIAGNOSIS — E1122 Type 2 diabetes mellitus with diabetic chronic kidney disease: Secondary | ICD-10-CM | POA: Diagnosis not present

## 2021-09-04 DIAGNOSIS — M47812 Spondylosis without myelopathy or radiculopathy, cervical region: Secondary | ICD-10-CM | POA: Diagnosis not present

## 2021-09-04 DIAGNOSIS — I5042 Chronic combined systolic (congestive) and diastolic (congestive) heart failure: Secondary | ICD-10-CM | POA: Diagnosis not present

## 2021-09-04 DIAGNOSIS — I2510 Atherosclerotic heart disease of native coronary artery without angina pectoris: Secondary | ICD-10-CM | POA: Diagnosis not present

## 2021-09-04 DIAGNOSIS — R0602 Shortness of breath: Secondary | ICD-10-CM | POA: Diagnosis not present

## 2021-09-04 DIAGNOSIS — F17200 Nicotine dependence, unspecified, uncomplicated: Secondary | ICD-10-CM

## 2021-09-04 DIAGNOSIS — R059 Cough, unspecified: Secondary | ICD-10-CM | POA: Diagnosis not present

## 2021-09-04 DIAGNOSIS — R404 Transient alteration of awareness: Secondary | ICD-10-CM | POA: Diagnosis not present

## 2021-09-04 DIAGNOSIS — M549 Dorsalgia, unspecified: Secondary | ICD-10-CM | POA: Diagnosis not present

## 2021-09-04 DIAGNOSIS — R1084 Generalized abdominal pain: Secondary | ICD-10-CM | POA: Diagnosis not present

## 2021-09-04 DIAGNOSIS — I130 Hypertensive heart and chronic kidney disease with heart failure and stage 1 through stage 4 chronic kidney disease, or unspecified chronic kidney disease: Secondary | ICD-10-CM | POA: Diagnosis not present

## 2021-09-04 DIAGNOSIS — E876 Hypokalemia: Secondary | ICD-10-CM

## 2021-09-04 DIAGNOSIS — S0990XA Unspecified injury of head, initial encounter: Secondary | ICD-10-CM | POA: Diagnosis not present

## 2021-09-04 DIAGNOSIS — R0902 Hypoxemia: Secondary | ICD-10-CM | POA: Diagnosis not present

## 2021-09-04 DIAGNOSIS — S199XXA Unspecified injury of neck, initial encounter: Secondary | ICD-10-CM | POA: Diagnosis not present

## 2021-09-05 ENCOUNTER — Ambulatory Visit: Payer: Medicare Other | Admitting: Sports Medicine

## 2021-09-05 DIAGNOSIS — R1013 Epigastric pain: Secondary | ICD-10-CM | POA: Diagnosis not present

## 2021-09-05 DIAGNOSIS — M50223 Other cervical disc displacement at C6-C7 level: Secondary | ICD-10-CM | POA: Diagnosis not present

## 2021-09-05 DIAGNOSIS — S199XXA Unspecified injury of neck, initial encounter: Secondary | ICD-10-CM | POA: Diagnosis not present

## 2021-09-05 DIAGNOSIS — R5381 Other malaise: Secondary | ICD-10-CM | POA: Diagnosis not present

## 2021-09-05 DIAGNOSIS — I110 Hypertensive heart disease with heart failure: Secondary | ICD-10-CM | POA: Diagnosis not present

## 2021-09-05 DIAGNOSIS — R0600 Dyspnea, unspecified: Secondary | ICD-10-CM | POA: Diagnosis not present

## 2021-09-05 DIAGNOSIS — I252 Old myocardial infarction: Secondary | ICD-10-CM | POA: Diagnosis not present

## 2021-09-05 DIAGNOSIS — E785 Hyperlipidemia, unspecified: Secondary | ICD-10-CM | POA: Diagnosis not present

## 2021-09-05 DIAGNOSIS — M47814 Spondylosis without myelopathy or radiculopathy, thoracic region: Secondary | ICD-10-CM | POA: Diagnosis not present

## 2021-09-05 DIAGNOSIS — Z791 Long term (current) use of non-steroidal anti-inflammatories (NSAID): Secondary | ICD-10-CM | POA: Diagnosis not present

## 2021-09-05 DIAGNOSIS — E874 Mixed disorder of acid-base balance: Secondary | ICD-10-CM | POA: Diagnosis not present

## 2021-09-05 DIAGNOSIS — M160 Bilateral primary osteoarthritis of hip: Secondary | ICD-10-CM | POA: Diagnosis not present

## 2021-09-05 DIAGNOSIS — E873 Alkalosis: Secondary | ICD-10-CM | POA: Diagnosis not present

## 2021-09-05 DIAGNOSIS — F17210 Nicotine dependence, cigarettes, uncomplicated: Secondary | ICD-10-CM | POA: Diagnosis not present

## 2021-09-05 DIAGNOSIS — G4730 Sleep apnea, unspecified: Secondary | ICD-10-CM | POA: Diagnosis not present

## 2021-09-05 DIAGNOSIS — R9431 Abnormal electrocardiogram [ECG] [EKG]: Secondary | ICD-10-CM | POA: Diagnosis not present

## 2021-09-05 DIAGNOSIS — R918 Other nonspecific abnormal finding of lung field: Secondary | ICD-10-CM | POA: Diagnosis not present

## 2021-09-05 DIAGNOSIS — R0602 Shortness of breath: Secondary | ICD-10-CM | POA: Diagnosis not present

## 2021-09-05 DIAGNOSIS — Z20822 Contact with and (suspected) exposure to covid-19: Secondary | ICD-10-CM | POA: Diagnosis not present

## 2021-09-05 DIAGNOSIS — Z8673 Personal history of transient ischemic attack (TIA), and cerebral infarction without residual deficits: Secondary | ICD-10-CM | POA: Diagnosis not present

## 2021-09-05 DIAGNOSIS — Z886 Allergy status to analgesic agent status: Secondary | ICD-10-CM | POA: Diagnosis not present

## 2021-09-05 DIAGNOSIS — E1140 Type 2 diabetes mellitus with diabetic neuropathy, unspecified: Secondary | ICD-10-CM | POA: Diagnosis not present

## 2021-09-05 DIAGNOSIS — G928 Other toxic encephalopathy: Secondary | ICD-10-CM | POA: Diagnosis not present

## 2021-09-05 DIAGNOSIS — K5900 Constipation, unspecified: Secondary | ICD-10-CM | POA: Diagnosis not present

## 2021-09-05 DIAGNOSIS — J9602 Acute respiratory failure with hypercapnia: Secondary | ICD-10-CM | POA: Diagnosis not present

## 2021-09-05 DIAGNOSIS — Z794 Long term (current) use of insulin: Secondary | ICD-10-CM | POA: Diagnosis not present

## 2021-09-05 DIAGNOSIS — R6889 Other general symptoms and signs: Secondary | ICD-10-CM | POA: Diagnosis not present

## 2021-09-05 DIAGNOSIS — R109 Unspecified abdominal pain: Secondary | ICD-10-CM | POA: Diagnosis not present

## 2021-09-05 DIAGNOSIS — R079 Chest pain, unspecified: Secondary | ICD-10-CM | POA: Diagnosis not present

## 2021-09-05 DIAGNOSIS — I5020 Unspecified systolic (congestive) heart failure: Secondary | ICD-10-CM | POA: Diagnosis not present

## 2021-09-05 DIAGNOSIS — R0902 Hypoxemia: Secondary | ICD-10-CM | POA: Diagnosis not present

## 2021-09-05 DIAGNOSIS — M549 Dorsalgia, unspecified: Secondary | ICD-10-CM | POA: Diagnosis not present

## 2021-09-05 DIAGNOSIS — I25110 Atherosclerotic heart disease of native coronary artery with unstable angina pectoris: Secondary | ICD-10-CM | POA: Diagnosis not present

## 2021-09-05 DIAGNOSIS — J9811 Atelectasis: Secondary | ICD-10-CM | POA: Diagnosis not present

## 2021-09-05 DIAGNOSIS — S299XXA Unspecified injury of thorax, initial encounter: Secondary | ICD-10-CM | POA: Diagnosis not present

## 2021-09-05 DIAGNOSIS — S0990XA Unspecified injury of head, initial encounter: Secondary | ICD-10-CM | POA: Diagnosis not present

## 2021-09-05 DIAGNOSIS — J441 Chronic obstructive pulmonary disease with (acute) exacerbation: Secondary | ICD-10-CM | POA: Diagnosis not present

## 2021-09-05 DIAGNOSIS — M5134 Other intervertebral disc degeneration, thoracic region: Secondary | ICD-10-CM | POA: Diagnosis not present

## 2021-09-05 DIAGNOSIS — S0093XA Contusion of unspecified part of head, initial encounter: Secondary | ICD-10-CM | POA: Diagnosis not present

## 2021-09-05 DIAGNOSIS — I5042 Chronic combined systolic (congestive) and diastolic (congestive) heart failure: Secondary | ICD-10-CM | POA: Diagnosis not present

## 2021-09-05 DIAGNOSIS — R059 Cough, unspecified: Secondary | ICD-10-CM | POA: Diagnosis not present

## 2021-09-05 DIAGNOSIS — I25118 Atherosclerotic heart disease of native coronary artery with other forms of angina pectoris: Secondary | ICD-10-CM | POA: Diagnosis not present

## 2021-09-05 DIAGNOSIS — Z743 Need for continuous supervision: Secondary | ICD-10-CM | POA: Diagnosis not present

## 2021-09-05 DIAGNOSIS — E1142 Type 2 diabetes mellitus with diabetic polyneuropathy: Secondary | ICD-10-CM | POA: Diagnosis not present

## 2021-09-05 DIAGNOSIS — M5030 Other cervical disc degeneration, unspecified cervical region: Secondary | ICD-10-CM | POA: Diagnosis not present

## 2021-09-06 DIAGNOSIS — R109 Unspecified abdominal pain: Secondary | ICD-10-CM | POA: Diagnosis not present

## 2021-09-06 DIAGNOSIS — R9431 Abnormal electrocardiogram [ECG] [EKG]: Secondary | ICD-10-CM | POA: Diagnosis not present

## 2021-09-06 DIAGNOSIS — J441 Chronic obstructive pulmonary disease with (acute) exacerbation: Secondary | ICD-10-CM | POA: Diagnosis not present

## 2021-09-07 DIAGNOSIS — J441 Chronic obstructive pulmonary disease with (acute) exacerbation: Secondary | ICD-10-CM | POA: Diagnosis not present

## 2021-09-08 DIAGNOSIS — R1013 Epigastric pain: Secondary | ICD-10-CM | POA: Diagnosis not present

## 2021-09-08 DIAGNOSIS — Z8673 Personal history of transient ischemic attack (TIA), and cerebral infarction without residual deficits: Secondary | ICD-10-CM | POA: Diagnosis not present

## 2021-09-08 DIAGNOSIS — I5020 Unspecified systolic (congestive) heart failure: Secondary | ICD-10-CM | POA: Diagnosis not present

## 2021-09-08 DIAGNOSIS — I110 Hypertensive heart disease with heart failure: Secondary | ICD-10-CM | POA: Diagnosis not present

## 2021-09-08 DIAGNOSIS — E873 Alkalosis: Secondary | ICD-10-CM | POA: Diagnosis not present

## 2021-09-08 DIAGNOSIS — E1140 Type 2 diabetes mellitus with diabetic neuropathy, unspecified: Secondary | ICD-10-CM | POA: Diagnosis not present

## 2021-09-09 DIAGNOSIS — I25118 Atherosclerotic heart disease of native coronary artery with other forms of angina pectoris: Secondary | ICD-10-CM | POA: Diagnosis not present

## 2021-09-09 DIAGNOSIS — Z8673 Personal history of transient ischemic attack (TIA), and cerebral infarction without residual deficits: Secondary | ICD-10-CM | POA: Diagnosis not present

## 2021-09-09 DIAGNOSIS — R109 Unspecified abdominal pain: Secondary | ICD-10-CM | POA: Diagnosis not present

## 2021-09-09 DIAGNOSIS — I110 Hypertensive heart disease with heart failure: Secondary | ICD-10-CM | POA: Diagnosis not present

## 2021-09-09 DIAGNOSIS — I5020 Unspecified systolic (congestive) heart failure: Secondary | ICD-10-CM | POA: Diagnosis not present

## 2021-09-09 DIAGNOSIS — E1140 Type 2 diabetes mellitus with diabetic neuropathy, unspecified: Secondary | ICD-10-CM | POA: Diagnosis not present

## 2021-09-09 DIAGNOSIS — R079 Chest pain, unspecified: Secondary | ICD-10-CM | POA: Diagnosis not present

## 2021-09-11 ENCOUNTER — Telehealth: Payer: Self-pay

## 2021-09-11 DIAGNOSIS — Z8673 Personal history of transient ischemic attack (TIA), and cerebral infarction without residual deficits: Secondary | ICD-10-CM | POA: Diagnosis not present

## 2021-09-11 DIAGNOSIS — F32A Depression, unspecified: Secondary | ICD-10-CM | POA: Diagnosis not present

## 2021-09-11 DIAGNOSIS — K219 Gastro-esophageal reflux disease without esophagitis: Secondary | ICD-10-CM | POA: Diagnosis not present

## 2021-09-11 DIAGNOSIS — E785 Hyperlipidemia, unspecified: Secondary | ICD-10-CM | POA: Diagnosis not present

## 2021-09-11 DIAGNOSIS — Z7982 Long term (current) use of aspirin: Secondary | ICD-10-CM | POA: Diagnosis not present

## 2021-09-11 DIAGNOSIS — E1140 Type 2 diabetes mellitus with diabetic neuropathy, unspecified: Secondary | ICD-10-CM | POA: Diagnosis not present

## 2021-09-11 DIAGNOSIS — I110 Hypertensive heart disease with heart failure: Secondary | ICD-10-CM | POA: Diagnosis not present

## 2021-09-11 DIAGNOSIS — I509 Heart failure, unspecified: Secondary | ICD-10-CM | POA: Diagnosis not present

## 2021-09-11 DIAGNOSIS — I2510 Atherosclerotic heart disease of native coronary artery without angina pectoris: Secondary | ICD-10-CM | POA: Diagnosis not present

## 2021-09-11 DIAGNOSIS — J449 Chronic obstructive pulmonary disease, unspecified: Secondary | ICD-10-CM | POA: Diagnosis not present

## 2021-09-11 DIAGNOSIS — Z7984 Long term (current) use of oral hypoglycemic drugs: Secondary | ICD-10-CM | POA: Diagnosis not present

## 2021-09-11 DIAGNOSIS — M545 Low back pain, unspecified: Secondary | ICD-10-CM | POA: Diagnosis not present

## 2021-09-11 NOTE — Telephone Encounter (Signed)
Patient admitted to [facility] 09/05/21 and discharged 09/09/21. ?-First unsuccessful attempt to reach patient 09/10/21 ?-Second unsuccessful attempt to reach patient 09/11/21 ?

## 2021-09-11 NOTE — Chronic Care Management (AMB) (Unsigned)
Chronic Care Management Pharmacy Assistant   Name: Brent Ortiz  MRN: [id number] DOB: 05/01/1961  Reason for Encounter: Disease State/ General  Recent office visits:  07-24-2021 [name], MD. Referral placed to neurology and Pulmonology.   05-27-2021 [name], MD. Glucose= 119, Creatinine= 1.36, eGFR= 59. A1C= 6.8. Cholesterol= 204, Trig= 313, VLDL= 53. START Celebrex 200 mg twice daily. INCREASE Lantus 20 units daily TO 30 units daily. STOP indocin, meloxicam and percocet.  Recent consult visits:  06-06-2021 [name], DPM (Podiatry). "Re-discussed and educated patient on diabetic foot care, especially with  regards to the vascular, neurological and musculoskeletal systems. Mechanically debrided all nails 1-5 bilateral using sterile nail nipper and filed with dremel without incident. Encourage skin emollients. Follow up in 3 months".    Hospital visits:  None in previous 6 months  Medications: Outpatient Encounter Medications as of 09/11/2021  Medication Sig   Accu-Chek Softclix Lancets lancets Use as instructed   acetaminophen (TYLENOL) 325 MG tablet Take 650 mg by mouth every 4 (four) hours as needed for mild pain.   albuterol (VENTOLIN HFA) 108 (90 Base) MCG/ACT inhaler INHALE 2 PUFFS INTO THE LUNGS EVERY 4 HOURS AS NEEDED FOR WHEEZING OR SHORTNESS OF BREATH.   allopurinol (ZYLOPRIM) 100 MG tablet Take 1 tablet (100 mg total) by mouth daily.   amLODipine (NORVASC) 5 MG tablet Take 5 mg by mouth daily.   B-D ULTRAFINE III SHORT PEN 31G X 8 MM MISC USE AS DIRECTED   Blood Glucose Monitoring Suppl (ACCU-CHEK GUIDE) w/Device KIT 1 each by Does not apply route in the morning, at noon, and at bedtime.   celecoxib (CELEBREX) 200 MG capsule Take 1 capsule (200 mg total) by mouth 2 (two) times daily.   clopidogrel (PLAVIX) 75 MG tablet Take 1 tablet (75 mg total) by mouth daily.   colchicine-probenecid 0.5-500 MG tablet Take 1 tablet by mouth 2 (two)  times daily as needed.   diclofenac Sodium (VOLTAREN) 1 % GEL Apply 2 g topically 4 (four) times daily.   ENTRESTO 24-26 MG TAKE 1 TABLET BY MOUTH 2 TIMES DAILY.(OBLONG PINK TAB WITH LZ NVR)   ezetimibe (ZETIA) 10 MG tablet Take 10 mg by mouth daily.   gabapentin (NEURONTIN) 300 MG capsule TAKE 1 CAPSULE ($RemoveBe'300MG'MSKHgLKJt$ ) BY MOUTH TWICE DAILY.(YELLOW CAP)   Glucagon, rDNA, (GLUCAGON EMERGENCY) 1 MG KIT Inject 1 mg into the muscle as needed (BG less than 70).   glucose blood (ACCU-CHEK GUIDE) test strip Use as instructed   GOODSENSE ASPIRIN LOW DOSE 81 MG EC tablet TAKE 1 TABLET ($RemoveB'81MG'xVFOiZcJ$ ) BY MOUTH DAILY. SWALLOW WHOLE.(YELLOW ROUND TAB WITH HEART)   isosorbide mononitrate (IMDUR) 60 MG 24 hr tablet Take 60 mg by mouth daily.   LANTUS SOLOSTAR 100 UNIT/ML Solostar Pen ADMINISTER 30 UNITS UNDER THE SKIN DAILY   meclizine (ANTIVERT) 12.5 MG tablet Take 12.5 mg by mouth 3 (three) times daily as needed for dizziness.   metFORMIN (GLUCOPHAGE) 500 MG tablet TAKE 1 TABLET BY MOUTH EVERY DAY WITH SUPPER.(ROUND WHITE TAB WITH H 102)   metoprolol succinate (TOPROL-XL) 25 MG 24 hr tablet TAKE 1 TABLET (25 MG TOTAL) BY MOUTH DAILY.(OVAL WHITE TAB WITH E7)   nicotine (NICODERM CQ - DOSED IN MG/24 HOURS) 21 mg/24hr patch 21 mg daily.   nicotine (NICODERM CQ - DOSED IN MG/24 HR) 7 mg/24hr patch APPLY 1 PATCH ONTO THE SKIN ONCE DAILY ** DISCARD PROPERLY   nitroGLYCERIN (NITROSTAT) 0.4 MG SL tablet Place 1 tablet (  0.4 mg total) under the tongue every 5 (five) minutes as needed. For chest pain   pantoprazole (PROTONIX) 40 MG tablet TAKE 1 TABLET($RemoveBefor'40MG'jXAPjcXPMLUL$ ) BY MOUTH DAILY.(OVALYELLOW TAB WITH H126)   polyethylene glycol (MIRALAX / GLYCOLAX) 17 g packet Take 17 g by mouth daily.   potassium chloride SA (KLOR-CON M) 20 MEQ tablet TAKE 1 TABLET(20MEQ TOTAL) BY MOUTH 4 TIMES DAILY.(OBLONG WHITE TAB WITH KC M20 OR WITH G20M)   ranolazine (RANEXA) 500 MG 12 hr tablet TAKE 1 TABLET BY MOUTH 2 TIMES DAILY (PEACH COLORED TABLET WITH C49 OR 588)    rosuvastatin (CRESTOR) 40 MG tablet Take 1 tablet (40 mg total) by mouth daily.   sertraline (ZOLOFT) 25 MG tablet Take 1 tablet (25 mg total) by mouth daily.   spironolactone (ALDACTONE) 25 MG tablet TAKE 1/2 TABLET(12.5) BY MOUTH DAILY. (HALF TAN TAB)   SYMBICORT 160-4.5 MCG/ACT inhaler INHALE 2 PUFFS INTO THE LUNGS TWICE DAILY.   tamsulosin (FLOMAX) 0.4 MG CAPS capsule TAKE 1 CAPSULE (.$RemoveBef'4MG'VzDgjujAzJ$  TOTAL) BY MOUTH DAILY.(TAN AND GOLD CAPSULE WITH CL23 0.4)   traZODone (DESYREL) 150 MG tablet Take 1 tablet (150 mg total) by mouth at bedtime.   No facility-administered encounter medications on file as of 09/11/2021.   Contacted [facility] for general disease state and medication adherence call.   Patient {is/isnt:25531} > 5 days past due for refill on the following medications per chart history:   What concerns do you have about your medications?  The patient {denies/reports:25180} side effects with his medications.   How often do you forget or accidentally miss a dose? {missed doses:25554}  Do you use a pillbox? {yes/no:20286}  Are you having any problems getting your medications from your pharmacy? {yes/no:20286}  Has the cost of your medications been a concern? {yes/no:20286} If yes, what medication and is patient assistance available or has it been applied for?  Since last visit with [facility], {no/thefollowing:25210} interventions have been made:   The patient has not had an ED visit since last contact.   The patient {denies/reports:25180} problems with their health.   he {denies/reports:25180}  concerns or questions for [place], at this time.   Patient states BP and BG readings are as follows***  Fasting: *** Before meals: *** After meals: *** Bedtime: ***  Counseled patient on: ***only leave if you discussed with patient   Doristine Devoid job taking medications! (If good adherence)***  Importance of taking medication daily without missed doses  Benefits of adherence  packaging or a pillbox   Access to CCM team for any cost, medication, or pharmacy concerns  09-11-2021: 1st attempt left VM  Care Gaps: Last annual wellness visit?  Missed [id number] If applicable: Last eye exam / retinopathy screening? Diabetic foot exam? 06-06-2021  Star Rating Drugs: Rosuvastatin 20 mg- Last filled 08-06-2021 90 DS Metfromin 500 mg- Last filled 09-02-2021 31 DS Entresto 24-26 mg- Last filled 09-02-2021 31 DS  [place] Clinical Pharmacist Assistant [phone]

## 2021-09-12 DIAGNOSIS — M545 Low back pain, unspecified: Secondary | ICD-10-CM | POA: Diagnosis not present

## 2021-09-12 DIAGNOSIS — F32A Depression, unspecified: Secondary | ICD-10-CM | POA: Diagnosis not present

## 2021-09-12 DIAGNOSIS — E785 Hyperlipidemia, unspecified: Secondary | ICD-10-CM | POA: Diagnosis not present

## 2021-09-12 DIAGNOSIS — E1140 Type 2 diabetes mellitus with diabetic neuropathy, unspecified: Secondary | ICD-10-CM | POA: Diagnosis not present

## 2021-09-12 DIAGNOSIS — K219 Gastro-esophageal reflux disease without esophagitis: Secondary | ICD-10-CM | POA: Diagnosis not present

## 2021-09-12 DIAGNOSIS — I509 Heart failure, unspecified: Secondary | ICD-10-CM | POA: Diagnosis not present

## 2021-09-12 DIAGNOSIS — I110 Hypertensive heart disease with heart failure: Secondary | ICD-10-CM | POA: Diagnosis not present

## 2021-09-12 DIAGNOSIS — J449 Chronic obstructive pulmonary disease, unspecified: Secondary | ICD-10-CM | POA: Diagnosis not present

## 2021-09-12 DIAGNOSIS — Z7982 Long term (current) use of aspirin: Secondary | ICD-10-CM | POA: Diagnosis not present

## 2021-09-12 DIAGNOSIS — Z8673 Personal history of transient ischemic attack (TIA), and cerebral infarction without residual deficits: Secondary | ICD-10-CM | POA: Diagnosis not present

## 2021-09-12 DIAGNOSIS — I2510 Atherosclerotic heart disease of native coronary artery without angina pectoris: Secondary | ICD-10-CM | POA: Diagnosis not present

## 2021-09-12 DIAGNOSIS — Z7984 Long term (current) use of oral hypoglycemic drugs: Secondary | ICD-10-CM | POA: Diagnosis not present

## 2021-09-15 DIAGNOSIS — J449 Chronic obstructive pulmonary disease, unspecified: Secondary | ICD-10-CM | POA: Diagnosis not present

## 2021-09-15 DIAGNOSIS — Z8673 Personal history of transient ischemic attack (TIA), and cerebral infarction without residual deficits: Secondary | ICD-10-CM | POA: Diagnosis not present

## 2021-09-15 DIAGNOSIS — F32A Depression, unspecified: Secondary | ICD-10-CM | POA: Diagnosis not present

## 2021-09-15 DIAGNOSIS — Z7984 Long term (current) use of oral hypoglycemic drugs: Secondary | ICD-10-CM | POA: Diagnosis not present

## 2021-09-15 DIAGNOSIS — Z7982 Long term (current) use of aspirin: Secondary | ICD-10-CM | POA: Diagnosis not present

## 2021-09-15 DIAGNOSIS — E785 Hyperlipidemia, unspecified: Secondary | ICD-10-CM | POA: Diagnosis not present

## 2021-09-15 DIAGNOSIS — M545 Low back pain, unspecified: Secondary | ICD-10-CM | POA: Diagnosis not present

## 2021-09-15 DIAGNOSIS — I2510 Atherosclerotic heart disease of native coronary artery without angina pectoris: Secondary | ICD-10-CM | POA: Diagnosis not present

## 2021-09-15 DIAGNOSIS — I509 Heart failure, unspecified: Secondary | ICD-10-CM | POA: Diagnosis not present

## 2021-09-15 DIAGNOSIS — I110 Hypertensive heart disease with heart failure: Secondary | ICD-10-CM | POA: Diagnosis not present

## 2021-09-15 DIAGNOSIS — E1140 Type 2 diabetes mellitus with diabetic neuropathy, unspecified: Secondary | ICD-10-CM | POA: Diagnosis not present

## 2021-09-15 DIAGNOSIS — K219 Gastro-esophageal reflux disease without esophagitis: Secondary | ICD-10-CM | POA: Diagnosis not present

## 2021-09-16 DIAGNOSIS — F32A Depression, unspecified: Secondary | ICD-10-CM | POA: Diagnosis not present

## 2021-09-16 DIAGNOSIS — K219 Gastro-esophageal reflux disease without esophagitis: Secondary | ICD-10-CM | POA: Diagnosis not present

## 2021-09-16 DIAGNOSIS — Z7982 Long term (current) use of aspirin: Secondary | ICD-10-CM | POA: Diagnosis not present

## 2021-09-16 DIAGNOSIS — E785 Hyperlipidemia, unspecified: Secondary | ICD-10-CM | POA: Diagnosis not present

## 2021-09-16 DIAGNOSIS — Z8673 Personal history of transient ischemic attack (TIA), and cerebral infarction without residual deficits: Secondary | ICD-10-CM | POA: Diagnosis not present

## 2021-09-16 DIAGNOSIS — E1140 Type 2 diabetes mellitus with diabetic neuropathy, unspecified: Secondary | ICD-10-CM | POA: Diagnosis not present

## 2021-09-16 DIAGNOSIS — I110 Hypertensive heart disease with heart failure: Secondary | ICD-10-CM | POA: Diagnosis not present

## 2021-09-16 DIAGNOSIS — J449 Chronic obstructive pulmonary disease, unspecified: Secondary | ICD-10-CM | POA: Diagnosis not present

## 2021-09-16 DIAGNOSIS — I2510 Atherosclerotic heart disease of native coronary artery without angina pectoris: Secondary | ICD-10-CM | POA: Diagnosis not present

## 2021-09-16 DIAGNOSIS — Z7984 Long term (current) use of oral hypoglycemic drugs: Secondary | ICD-10-CM | POA: Diagnosis not present

## 2021-09-16 DIAGNOSIS — M545 Low back pain, unspecified: Secondary | ICD-10-CM | POA: Diagnosis not present

## 2021-09-16 DIAGNOSIS — I509 Heart failure, unspecified: Secondary | ICD-10-CM | POA: Diagnosis not present

## 2021-09-19 DIAGNOSIS — Z8673 Personal history of transient ischemic attack (TIA), and cerebral infarction without residual deficits: Secondary | ICD-10-CM | POA: Diagnosis not present

## 2021-09-19 DIAGNOSIS — K219 Gastro-esophageal reflux disease without esophagitis: Secondary | ICD-10-CM | POA: Diagnosis not present

## 2021-09-19 DIAGNOSIS — J449 Chronic obstructive pulmonary disease, unspecified: Secondary | ICD-10-CM | POA: Diagnosis not present

## 2021-09-19 DIAGNOSIS — E785 Hyperlipidemia, unspecified: Secondary | ICD-10-CM | POA: Diagnosis not present

## 2021-09-19 DIAGNOSIS — M545 Low back pain, unspecified: Secondary | ICD-10-CM | POA: Diagnosis not present

## 2021-09-19 DIAGNOSIS — I509 Heart failure, unspecified: Secondary | ICD-10-CM | POA: Diagnosis not present

## 2021-09-19 DIAGNOSIS — I110 Hypertensive heart disease with heart failure: Secondary | ICD-10-CM | POA: Diagnosis not present

## 2021-09-19 DIAGNOSIS — I2510 Atherosclerotic heart disease of native coronary artery without angina pectoris: Secondary | ICD-10-CM | POA: Diagnosis not present

## 2021-09-19 DIAGNOSIS — Z7982 Long term (current) use of aspirin: Secondary | ICD-10-CM | POA: Diagnosis not present

## 2021-09-19 DIAGNOSIS — E1140 Type 2 diabetes mellitus with diabetic neuropathy, unspecified: Secondary | ICD-10-CM | POA: Diagnosis not present

## 2021-09-19 DIAGNOSIS — F32A Depression, unspecified: Secondary | ICD-10-CM | POA: Diagnosis not present

## 2021-09-19 DIAGNOSIS — Z7984 Long term (current) use of oral hypoglycemic drugs: Secondary | ICD-10-CM | POA: Diagnosis not present

## 2021-09-22 ENCOUNTER — Telehealth: Payer: Self-pay

## 2021-09-22 DIAGNOSIS — K219 Gastro-esophageal reflux disease without esophagitis: Secondary | ICD-10-CM | POA: Diagnosis not present

## 2021-09-22 DIAGNOSIS — F32A Depression, unspecified: Secondary | ICD-10-CM | POA: Diagnosis not present

## 2021-09-22 DIAGNOSIS — E1140 Type 2 diabetes mellitus with diabetic neuropathy, unspecified: Secondary | ICD-10-CM | POA: Diagnosis not present

## 2021-09-22 DIAGNOSIS — Z7982 Long term (current) use of aspirin: Secondary | ICD-10-CM | POA: Diagnosis not present

## 2021-09-22 DIAGNOSIS — M545 Low back pain, unspecified: Secondary | ICD-10-CM | POA: Diagnosis not present

## 2021-09-22 DIAGNOSIS — Z7984 Long term (current) use of oral hypoglycemic drugs: Secondary | ICD-10-CM | POA: Diagnosis not present

## 2021-09-22 DIAGNOSIS — Z8673 Personal history of transient ischemic attack (TIA), and cerebral infarction without residual deficits: Secondary | ICD-10-CM | POA: Diagnosis not present

## 2021-09-22 DIAGNOSIS — J449 Chronic obstructive pulmonary disease, unspecified: Secondary | ICD-10-CM | POA: Diagnosis not present

## 2021-09-22 DIAGNOSIS — E785 Hyperlipidemia, unspecified: Secondary | ICD-10-CM | POA: Diagnosis not present

## 2021-09-22 DIAGNOSIS — I509 Heart failure, unspecified: Secondary | ICD-10-CM | POA: Diagnosis not present

## 2021-09-22 DIAGNOSIS — I110 Hypertensive heart disease with heart failure: Secondary | ICD-10-CM | POA: Diagnosis not present

## 2021-09-22 DIAGNOSIS — I2510 Atherosclerotic heart disease of native coronary artery without angina pectoris: Secondary | ICD-10-CM | POA: Diagnosis not present

## 2021-09-22 NOTE — Progress Notes (Signed)
Subjective:  Patient ID: Brent Ortiz, male    DOB: 1961-04-17  Age: 61 y.o. MRN: [id number]  Chief Complaint  Patient presents with   Diabetes   Hypertension   Hyperlipidemia    HPI: chronic visit: transition of care reconciliation of medicines Patient admitted for COPD with exacerbation from 3/3 to 09/09/2021 They treated COPD and used antibiotics.  He is off antibiotics.  Patient present with type 2 diabetes.  Specifically, this is type 2, insulin requiring diabetes, complicated by hyperlipidemia, hypertension, polyneuropathy.  Compliance with treatment has been good; patient take medicines as directed, maintains diet and exercise regimen, follows up as directed, and is keeping glucose diary. Current medicines for diabetes Lantus 30 units into the skin daily, metformin 500 mg daily. Patient performs foot exams daily and last ophthalmologic exam was 11/12/2020. Last A1C was 6.8%.  Patient presents with hyperlipidemia.  Compliance with treatment has been good; patient takes medicines as directed, maintains low cholesterol diet, follows up as directed, and maintains exercise regimen.  Patient is using Rosuvastatin 40 mg daily, Zetia 10 mg daily without problems.   Patient presents for follow up of hypertension.  Patient tolerating well  Spironolactone 25 mg  daily, metoprolol 25 mg twice a day, amlodipine 5 mg daily without side effects.  Patient is working on maintaining diet and exercise regimen and follows up as directed.   CHF: He takes Entresto 24-26 mg twice a day, Isosorbide 60 mg daily.  Chronic pain with history of abuse.  Failed 2 pain clinics. Current Outpatient Medications on File Prior to Visit  Medication Sig Dispense Refill   Accu-Chek Softclix Lancets lancets Use as instructed 100 each 5   albuterol (VENTOLIN HFA) 108 (90 Base) MCG/ACT inhaler INHALE 2 PUFFS INTO THE LUNGS EVERY 4 HOURS AS NEEDED FOR WHEEZING OR SHORTNESS OF BREATH. 8.5 g 6   allopurinol (ZYLOPRIM) 100 MG  tablet Take 1 tablet (100 mg total) by mouth daily. 30 tablet 6   amLODipine (NORVASC) 5 MG tablet Take 5 mg by mouth daily.     B-D ULTRAFINE III SHORT PEN 31G X 8 MM MISC USE AS DIRECTED 100 each 4   Blood Glucose Monitoring Suppl (ACCU-CHEK GUIDE) w/Device KIT 1 each by Does not apply route in the morning, at noon, and at bedtime. 1 kit 0   celecoxib (CELEBREX) 200 MG capsule Take 1 capsule (200 mg total) by mouth 2 (two) times daily. 60 capsule 5   clopidogrel (PLAVIX) 75 MG tablet Take 1 tablet (75 mg total) by mouth daily. 30 tablet 6   colchicine-probenecid 0.5-500 MG tablet Take 1 tablet by mouth 2 (two) times daily as needed.     diclofenac Sodium (VOLTAREN) 1 % GEL Apply 2 g topically 4 (four) times daily.     ENTRESTO 24-26 MG TAKE 1 TABLET BY MOUTH 2 TIMES DAILY.(OBLONG PINK TAB WITH LZ NVR) 180 tablet 1   ezetimibe (ZETIA) 10 MG tablet Take 10 mg by mouth daily.     famotidine (PEPCID) 20 MG tablet Take 20 mg by mouth 2 (two) times daily.     Glucagon, rDNA, (GLUCAGON EMERGENCY) 1 MG KIT Inject 1 mg into the muscle as needed (BG less than 70).     glucose blood (ACCU-CHEK GUIDE) test strip Use as instructed 100 each 5   GOODSENSE ASPIRIN LOW DOSE 81 MG EC tablet TAKE 1 TABLET (81MG ) BY MOUTH DAILY. SWALLOW WHOLE.(YELLOW ROUND TAB WITH HEART) 90 tablet 2   isosorbide mononitrate (IMDUR) 60  MG 24 hr tablet Take 60 mg by mouth daily.     LANTUS SOLOSTAR 100 UNIT/ML Solostar Pen ADMINISTER 30 UNITS UNDER THE SKIN DAILY 15 mL 2   meclizine (ANTIVERT) 12.5 MG tablet Take 12.5 mg by mouth 3 (three) times daily as needed for dizziness.     metFORMIN (GLUCOPHAGE) 500 MG tablet TAKE 1 TABLET BY MOUTH EVERY DAY WITH SUPPER.(ROUND WHITE TAB WITH H 102) 90 tablet 2   metoprolol succinate (TOPROL-XL) 25 MG 24 hr tablet TAKE 1 TABLET (25 MG TOTAL) BY MOUTH DAILY.(OVAL WHITE TAB WITH E7) 31 tablet 3   nitroGLYCERIN (NITROSTAT) 0.4 MG SL tablet Place 1 tablet (0.4 mg total) under the tongue every 5  (five) minutes as needed. For chest pain 50 tablet 3   pantoprazole (PROTONIX) 40 MG tablet TAKE 1 TABLET(40MG ) BY MOUTH DAILY.(OVALYELLOW TAB WITH H126) 90 tablet 2   polyethylene glycol (MIRALAX / GLYCOLAX) 17 g packet Take 17 g by mouth daily.     potassium chloride SA (KLOR-CON M) 20 MEQ tablet TAKE 1 TABLET(20MEQ TOTAL) BY MOUTH 4 TIMES DAILY.(OBLONG WHITE TAB WITH KC M20 OR WITH G20M) 124 tablet 2   ranolazine (RANEXA) 500 MG 12 hr tablet TAKE 1 TABLET BY MOUTH 2 TIMES DAILY (PEACH COLORED TABLET WITH C49 OR 588) 186 tablet 0   rosuvastatin (CRESTOR) 40 MG tablet Take 1 tablet (40 mg total) by mouth daily. 90 tablet 2   sertraline (ZOLOFT) 25 MG tablet Take 1 tablet (25 mg total) by mouth daily. 90 tablet 2   spironolactone (ALDACTONE) 25 MG tablet TAKE 1/2 TABLET(12.5) BY MOUTH DAILY. (HALF TAN TAB) 30 tablet 3   SYMBICORT 160-4.5 MCG/ACT inhaler INHALE 2 PUFFS INTO THE LUNGS TWICE DAILY. 30.6 g 2   tamsulosin (FLOMAX) 0.4 MG CAPS capsule TAKE 1 CAPSULE (.4MG  TOTAL) BY MOUTH DAILY.(TAN AND GOLD CAPSULE WITH CL23 0.4) 93 capsule 3   traZODone (DESYREL) 150 MG tablet Take 1 tablet (150 mg total) by mouth at bedtime. 90 tablet 2   No current facility-administered medications on file prior to visit.   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, unspecified vessel or lesion type (HCC) 01/23/2016   Overview:  Completely occluded circumflex artery proximal to obtuse marginal 1 basin cardiac catheter from 2017   Chronic kidney disease    Chronic pain 10/19/2019   Sees pain clinic   COPD (chronic obstructive pulmonary disease) (HCC)    Coronary artery disease involving native heart 02/08/2020   Coronary atherosclerosis of native coronary artery 01/23/2016   Overview:  Completely occluded circumflex artery proximal to obtuse  marginal 1 basin cardiac catheter from 2017   Depression, major, single episode, moderate (HCC) 07/11/2020   Diabetic polyneuropathy (HCC) 12/06/2019   ED (erectile dysfunction) 12/06/2019   Essential hypertension 01/23/2016   Essential thrombocythemia (HCC) 09/29/2011   GERD without esophagitis    HFrEF (heart failure with reduced ejection fraction) (HCC) 02/08/2020   Knee pain, left 02/12/2012   Formatting of this note might be different from the original. Left   Leukocytosis 09/04/2011   Mixed hyperlipidemia 06/18/2017   Morbid obesity (HCC) 11/21/2020   Morbid obesity with BMI of 40.0-44.9, adult (HCC) 01/13/2013   Myocardial infarction (HCC)    04-13-2011   Obstructive chronic bronchitis with exacerbation (HCC) 10/03/2019   Opiate abuse, continuous (HCC)  Osteoarthritis    Pain in knee joint 02/12/2012   Formatting of this note might be different from the original. Left   Pneumonia    Pneumonia due to infectious organism 09/04/2011   Polysubstance abuse (HCC)    Poor dentition    Prostatitis 12/04/2020   Thrombocytosis 09/29/2011   Tobacco dependence 01/23/2016   Type 2 diabetes mellitus with diabetic polyneuropathy [facility])    Past Surgical History:  Procedure Laterality Date   HERNIA REPAIR  2009   I & D KNEE WITH POLY EXCHANGE  09/03/2011   Procedure: IRRIGATION AND DEBRIDEMENT KNEE WITH POLY EXCHANGE;  Surgeon: Shelda Pal, MD;  Location: [facility] ORS;  Service: Orthopedics;  Laterality: Left;   TOOTH EXTRACTION Bilateral 08/06/2017   Procedure: DENTAL RESTORATION/EXTRACTIONS;  Surgeon: Ocie Doyne, DDS;  Location: [facility] OR;  Service: Oral Surgery;  Laterality: Bilateral;   TOTAL KNEE ARTHROPLASTY   right  feb 2012   left march 2012 r   TOTAL KNEE REVISION  06/23/2011   Procedure: TOTAL KNEE REVISION;  Surgeon: Shelda Pal;  Location: [facility] ORS;  Service: Orthopedics;  Laterality: Left;  femomal nerve block done in holding area without incident    Family History  Problem  Relation Age of Onset   Colon cancer Other    Colon cancer Mother    Social History   Socioeconomic History   Marital status: Widowed    Spouse name: Not on file   Number of children: 5   Years of education: Not on file   Highest education level: Not on file  Occupational History   Occupation: Disabled  Tobacco Use   Smoking status: Every Day    Packs/day: 0.50    Years: 50.00    Pack years: 25.00    Types: Cigarettes   Smokeless tobacco: Never  Vaping Use   Vaping Use: Former  Substance and Sexual Activity   Alcohol use: No    Comment: heavy drinker in past    Drug use: No   Sexual activity: Not Currently  Other Topics Concern   Not on file  Social History Narrative   Not on file   Social Determinants of Health   Financial Resource Strain: Not on file  Food Insecurity: Not on file  Transportation Needs: Not on file  Physical Activity: Not on file  Stress: Not on file  Social Connections: Not on file    Review of Systems  Constitutional:  Negative for chills, fatigue, fever and unexpected weight change.  HENT:  Negative for congestion, ear pain, sinus pain and sore throat.   Eyes:  Negative for visual disturbance.  Respiratory:  Positive for shortness of breath. Negative for cough.   Cardiovascular:  Positive for chest pain. Negative for palpitations.  Gastrointestinal:  Negative for abdominal pain, blood in stool, constipation, diarrhea, nausea and vomiting.  Endocrine: Negative for polydipsia.  Genitourinary:  Negative for dysuria.  Musculoskeletal:  Positive for arthralgias, back pain and myalgias.  Skin:  Negative for rash.  Neurological:  Negative for headaches.    Objective:  BP 100/70   Pulse 100   Temp (!) 97.5 F (36.4 C)   Resp 18   Ht 5\' 8"  (1.727 m)   Wt 299 lb (135.6 kg)   SpO2 100%   BMI 45.46 kg/m      09/24/2021    9:28 AM 07/24/2021   10:18 AM 05/27/2021    9:50 AM  BP/Weight  Systolic BP 100 146 120  Diastolic BP 70  82 70   Wt. (Lbs) 299 300.4 290  BMI 45.46 kg/m2 45.68 kg/m2 44.09 kg/m2    Physical Exam Vitals reviewed.  Constitutional:      General: He is not in acute distress.    Appearance: Normal appearance. He is obese.  HENT:     Head: Normocephalic.     Right Ear: Tympanic membrane normal.     Left Ear: Tympanic membrane normal.     Nose: Nose normal.     Mouth/Throat:     Mouth: Mucous membranes are moist.     Pharynx: Oropharynx is clear.  Eyes:     Conjunctiva/sclera: Conjunctivae normal.     Pupils: Pupils are equal, round, and reactive to light.  Cardiovascular:     Rate and Rhythm: Normal rate and regular rhythm.     Pulses: Normal pulses.     Heart sounds: Normal heart sounds. No murmur heard.   No gallop.  Pulmonary:     Effort: Pulmonary effort is normal. No respiratory distress.     Breath sounds: Normal breath sounds. No wheezing.  Abdominal:     General: Abdomen is flat. Bowel sounds are normal. There is no distension.     Tenderness: There is no abdominal tenderness.  Musculoskeletal:     Cervical back: Normal range of motion and neck supple.     Right lower leg: No edema.     Left lower leg: No edema.     Comments: Chronic back and joint pain 7/10 pain constant  Skin:    General: Skin is warm.     Capillary Refill: Capillary refill takes less than 2 seconds.  Neurological:     General: No focal deficit present.     Mental Status: He is alert and oriented to person, place, and time. Mental status is at baseline.     Motor: Weakness present.     Gait: Gait abnormal.     Deep Tendon Reflexes: Reflexes normal.  Psychiatric:        Mood and Affect: Mood normal.    Diabetic Foot Exam - Simple   Simple Foot Form Diabetic Foot exam was performed with the following findings: Yes 09/24/2021 10:04 AM  Visual Inspection See comments: Yes Sensation Testing See comments: Yes Pulse Check Posterior Tibialis and Dorsalis pulse intact bilaterally: Yes Comments Bunions,  poor nail care, numbness feet      Lab Results  Component Value Date   WBC 8.6 05/27/2021   HGB 14.9 05/27/2021   HCT 43.7 05/27/2021   PLT 337 05/27/2021   GLUCOSE 119 (H) 05/27/2021   CHOL 204 (H) 05/27/2021   TRIG 313 (H) 05/27/2021   HDL 52 05/27/2021   LDLCALC 99 05/27/2021   ALT 13 05/27/2021   AST 19 05/27/2021   NA 138 05/27/2021   K 5.0 05/27/2021   CL 100 05/27/2021   CREATININE 1.36 (H) 05/27/2021   BUN 18 05/27/2021   CO2 20 05/27/2021   TSH 0.719 01/24/2021   INR 1.81 (H) 09/07/2011   HGBA1C 6.8 (H) 05/27/2021   MICROALBUR 80 09/27/2020      09/24/2021    9:45 AM 05/27/2021   10:22 AM 05/01/2021   10:12 AM 01/24/2021    8:44 AM 09/27/2020    8:46 AM  Depression screen PHQ 2/9  Decreased Interest 3 1 1 1  0  Down, Depressed, Hopeless 0 1 0 1 0  PHQ - 2 Score 3 2 1 2  0  Altered sleeping 0 2  2  0  Tired, decreased energy 3 2  2  0  Change in appetite 3 3  1  0  Feeling bad or failure about yourself  0 1  1 0  Trouble concentrating 0 2  1 0  Moving slowly or fidgety/restless 3 2  1  0  Suicidal thoughts 0 0  0 0  PHQ-9 Score 12 14  10  0  Difficult doing work/chores Somewhat difficult Very difficult  Somewhat difficult Not difficult at all      Assessment & Plan:   Problem List Items Addressed This Visit       Cardiovascular and Mediastinum   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 Patient has had recent angina or nitroglycerin use. continue present treatment. See cardiology     Essential hypertension   Relevant Orders   Comprehensive metabolic panel   CBC with Differential/Platelet An individual hypertension care plan was established and reinforced today.  The patient's status was assessed using  clinical findings on exam and labs or diagnostic tests. The patient's success at meeting treatment goals on disease specific evidence-based guidelines and found to be fair controlled. SELF MANAGEMENT: The patient and I together assessed ways to personally work towards obtaining the recommended goals. RECOMMENDATIONS: avoid decongestants found in common cold remedies, decrease consumption of alcohol, perform routine monitoring of BP with home BP cuff, exercise, reduction of dietary salt, take medicines as prescribed, try not to miss doses and quit smoking.  Regular exercise and maintaining a healthy weight is needed.  Stress reduction may help. A CLINICAL SUMMARY including written plan identify barriers to care unique to individual due to social or financial issues.  We attempt to mutually creat solutions for individual and family understanding.    Acute combined systolic and diastolic CHF, NYHA class 2 (HCC) An individualized care plan was established and reinforced.  The patient's disease status was assessed using clinical finding son exam today, labs, and/or other diagnostic testing such as x-rays, to determine the patient's success in meeting treatmentgoalsbased on disease-based guidelines and found to be improving. But not at goal yet. Medications prescriptions no changes Laboratory tests ordered to be performed today include routine. RECOMMENDATIONS: given include see cardiology.  Call physician is patient gains 3 lbs in one day or 5 lbs for one week.  Call for progressive PND, orthopnea or increased pedal edema.         Respiratory   Obstructive sleep apnea Using CPAP consistently every night and medically benefiting from its use.      Endocrine   Diabetic polyneuropathy (HCC)   Relevant Orders   Hemoglobin A1c   Microalbumin / creatinine urine ratio An individual care plan for diabetes was established and reinforced today.  The patient's status was assessed using clinical findings on exam,  labs and diagnostic testing. Patient success at meeting goals based on disease specific evidence-based guidelines and found to be fair controlled. Cut insulin to 15 units a day and start ozempic 0.5mg  q week Medications were assessed and patient's understanding of the medical issues , including barriers were assessed. Recommend adherence to a diabetic diet, a graduated exercise program, HgbA1c level is checked quarterly, and urine microalbumin performed yearly .  Annual mono-filament sensation testing performed. Lower blood pressure and control hyperlipidemia is important. Get  annual eye exams and annual flu shots and smoking cessation discussed.  Self management goals were discussed.      Genitourinary   Stage 3b chronic kidney disease (HCC) Patient was evaluated for stage 3a.  It is important to maintain good Blood Pressure control and diabetes control. Keep on low protein diet and remain with adequate hydration to maintain function.      Other   Mixed hyperlipidemia   Relevant Orders   Lipid panel AN INDIVIDUAL CARE PLAN for hyperlipidemia/ cholesterol was established and reinforced today.  The patient's status was assessed using clinical findings on exam, lab and other diagnostic tests. The patient's disease status was assessed based on evidence-based guidelines and found to be fair controlled. MEDICATIONS were reviewed. SELF MANAGEMENT GOALS have been discussed and patient's success at attaining the goal of low cholesterol was assessed. RECOMMENDATION given include regular exercise 3 days a week and low cholesterol/low fat diet. CLINICAL SUMMARY including written plan to identify barriers unique to the patient due to social or economic  reasons was discussed.    Chronic pain   Relevant Medications   buprenorphine (BUTRANS) 10 MCG/HR PTWK Patient has a long history of chronic pain of the back and both knees he has had multiple knee revisions.  He was treated with pain medicines until he was  caught snorting his OxyContin he was then put in rehab and then later put on Suboxone program which she was tapered off he has been at 2 pain clinics 1 he was dismissed because his urine drug screen did show cocaine and the other 1 he missed too many appointments.  He says his pain is chronically 7 out of 10.  We discussed this being a very high risk patient we will try buprenorphine on for short time and we will have to get drug screens on him.  He will be following up with Dr. Sedalia Muta.   Depression, major, single episode, moderate (HCC) Patient's depression is partially controlled with with no medicines.   Anhedonia better.  PHQ 9 was performed score 14. An individual care plan was established or reinforced today.  The patient's disease status was assessed using clinical findings on exam, labs, and or other diagnostic testing to determine patient's success in meeting treatment goals based on disease specific evidence-based guidelines and found to be pain dependent Recommendations include no medicine changes  BMI 40-45 An individualize plan was formulated for obesity using patient history and physical exam to encourage weight loss.  An evidence based program was formulated.  Patient is to cut portion size, start ozempic with meals and to plan physical exercise 3 days a week at least 20 minutes.  Weight watchers and other programs are helpful.  Planned amount of weight loss 10 lbs.   Morbid obesity An individualize plan was formulated for obesity using patient history and physical exam to encourage weight loss.  An evidence based program was formulated.  Patient is to cut portion size, start Ozempic with meals and to plan physical exercise 3 days a week at least 20 minutes.  Weight watchers and other programs are helpful.  Planned amount of weight loss 10 lbs.   Tobacco dependence Individual plan was given to patient based on exam, history and other tests and using evidence based criteria for care for smoking  cessation.  We discussed behavioral changes to help cessation and offered medicine and counseing to aid in quitting.  Medical consequences of tobacco use were explained. He recently was admitted for CCOPD  with exacerbation  .  Meds ordered this encounter  Medications   DISCONTD: buprenorphine (BUTRANS) 10 MCG/HR PTWK    Sig: Place 1 patch onto the skin once a week.    Dispense:  4 patch    Refill:  2   Semaglutide,0.25 or 0.5MG /DOS, (OZEMPIC, 0.25 OR 0.5 MG/DOSE,) 2 MG/1.5ML SOPN    Sig: Inject 0.5 mg into the skin once a week.    Dispense:  6 mL    Refill:  3   buprenorphine (BUTRANS) 10 MCG/HR PTWK    Sig: Place 1 patch onto the skin once a week.    Dispense:  4 patch    Refill:  2    Orders Placed This Encounter  Procedures   Comprehensive metabolic panel   Hemoglobin A1c   Lipid panel   CBC with Differential/Platelet   Microalbumin / creatinine urine ratio     Follow-up: Return in about 3 months (around 12/25/2021) for dr. Sedalia Muta.  An After Visit Summary was printed and given to the patient.  Brent Bulla, MD [name] [phone]

## 2021-09-22 NOTE — Telephone Encounter (Signed)
Estill Bamberg w/ [facility] calling with report on patient. Patient has been having elevated BP and HR. BP today 156/90 with HR 96. Last week BP and HR was around the same. He is complaining of 7/10 pain in left hip, knee, and back. He can feel his heart is faster than normal but denies other symptoms. She called as Juluis Rainier to Dr as patient has appointment on 3/22.  ? ?Harrell Lark 09/22/21 11:53 AM ? ?

## 2021-09-22 NOTE — Telephone Encounter (Signed)
Patient needs chronic pain management, has been kicked out in passed ?lp ?

## 2021-09-24 ENCOUNTER — Ambulatory Visit (INDEPENDENT_AMBULATORY_CARE_PROVIDER_SITE_OTHER): Payer: Medicare Other | Admitting: Legal Medicine

## 2021-09-24 ENCOUNTER — Encounter: Payer: Self-pay | Admitting: Legal Medicine

## 2021-09-24 ENCOUNTER — Other Ambulatory Visit: Payer: Self-pay

## 2021-09-24 VITALS — BP 100/70 | HR 100 | Temp 97.5°F | Resp 18 | Ht 68.0 in | Wt 299.0 lb

## 2021-09-24 DIAGNOSIS — F17200 Nicotine dependence, unspecified, uncomplicated: Secondary | ICD-10-CM | POA: Diagnosis not present

## 2021-09-24 DIAGNOSIS — F321 Major depressive disorder, single episode, moderate: Secondary | ICD-10-CM

## 2021-09-24 DIAGNOSIS — E782 Mixed hyperlipidemia: Secondary | ICD-10-CM

## 2021-09-24 DIAGNOSIS — I25118 Atherosclerotic heart disease of native coronary artery with other forms of angina pectoris: Secondary | ICD-10-CM

## 2021-09-24 DIAGNOSIS — E1142 Type 2 diabetes mellitus with diabetic polyneuropathy: Secondary | ICD-10-CM

## 2021-09-24 DIAGNOSIS — G894 Chronic pain syndrome: Secondary | ICD-10-CM

## 2021-09-24 DIAGNOSIS — I5020 Unspecified systolic (congestive) heart failure: Secondary | ICD-10-CM | POA: Diagnosis not present

## 2021-09-24 DIAGNOSIS — I10 Essential (primary) hypertension: Secondary | ICD-10-CM | POA: Diagnosis not present

## 2021-09-24 DIAGNOSIS — G4733 Obstructive sleep apnea (adult) (pediatric): Secondary | ICD-10-CM | POA: Diagnosis not present

## 2021-09-24 DIAGNOSIS — I5041 Acute combined systolic (congestive) and diastolic (congestive) heart failure: Secondary | ICD-10-CM

## 2021-09-24 DIAGNOSIS — Z6841 Body Mass Index (BMI) 40.0 and over, adult: Secondary | ICD-10-CM

## 2021-09-24 DIAGNOSIS — N1832 Chronic kidney disease, stage 3b: Secondary | ICD-10-CM | POA: Diagnosis not present

## 2021-09-24 MED ORDER — BUPRENORPHINE 10 MCG/HR TD PTWK: 1.0000 | MEDICATED_PATCH | TRANSDERMAL | 2 refills | Status: DC

## 2021-09-24 MED ORDER — OZEMPIC (0.25 OR 0.5 MG/DOSE) 2 MG/1.5ML ~~LOC~~ SOPN: 0.5000 mg | PEN_INJECTOR | SUBCUTANEOUS | 3 refills | Status: DC

## 2021-09-24 NOTE — Telephone Encounter (Signed)
Sent to [place] ?lp ?

## 2021-09-24 NOTE — Patient Instructions (Signed)
Cut insulin to 15 units a day and start ozempic at 0.5 every week sq ?

## 2021-09-25 ENCOUNTER — Encounter: Payer: Self-pay | Admitting: Podiatry

## 2021-09-25 ENCOUNTER — Other Ambulatory Visit: Payer: Self-pay | Admitting: Nurse Practitioner

## 2021-09-25 ENCOUNTER — Ambulatory Visit (INDEPENDENT_AMBULATORY_CARE_PROVIDER_SITE_OTHER): Payer: Medicare Other | Admitting: Podiatry

## 2021-09-25 ENCOUNTER — Telehealth: Payer: Self-pay | Admitting: Nurse Practitioner

## 2021-09-25 DIAGNOSIS — M79609 Pain in unspecified limb: Secondary | ICD-10-CM | POA: Diagnosis not present

## 2021-09-25 DIAGNOSIS — B351 Tinea unguium: Secondary | ICD-10-CM | POA: Diagnosis not present

## 2021-09-25 DIAGNOSIS — E875 Hyperkalemia: Secondary | ICD-10-CM

## 2021-09-25 DIAGNOSIS — E1142 Type 2 diabetes mellitus with diabetic polyneuropathy: Secondary | ICD-10-CM

## 2021-09-25 LAB — MICROALBUMIN / CREATININE URINE RATIO
Creatinine, Urine: 226.7 mg/dL
Microalb/Creat Ratio: 5 mg/g creat (ref 0–29)
Microalbumin, Urine: 12 ug/mL

## 2021-09-25 LAB — COMPREHENSIVE METABOLIC PANEL
ALT: 14 IU/L (ref 0–44)
AST: 15 IU/L (ref 0–40)
Albumin/Globulin Ratio: 1.7 (ref 1.2–2.2)
Albumin: 4.7 g/dL (ref 3.8–4.8)
Alkaline Phosphatase: 73 IU/L (ref 44–121)
BUN/Creatinine Ratio: 13 (ref 10–24)
BUN: 16 mg/dL (ref 8–27)
Bilirubin Total: 0.5 mg/dL (ref 0.0–1.2)
CO2: 23 mmol/L (ref 20–29)
Calcium: 10.1 mg/dL (ref 8.6–10.2)
Chloride: 98 mmol/L (ref 96–106)
Creatinine, Ser: 1.22 mg/dL (ref 0.76–1.27)
Globulin, Total: 2.7 g/dL (ref 1.5–4.5)
Glucose: 208 mg/dL — ABNORMAL HIGH (ref 70–99)
Potassium: 6.3 mmol/L (ref 3.5–5.2)
Sodium: 135 mmol/L (ref 134–144)
Total Protein: 7.4 g/dL (ref 6.0–8.5)
eGFR: 67 mL/min/{1.73_m2} (ref >59)

## 2021-09-25 LAB — CBC WITH DIFFERENTIAL/PLATELET
Basophils Absolute: 0.1 10*3/uL (ref 0.0–0.2)
Basos: 1 %
EOS (ABSOLUTE): 0.1 10*3/uL (ref 0.0–0.4)
Eos: 1 %
Hematocrit: 44.2 % (ref 37.5–51.0)
Hemoglobin: 15.5 g/dL (ref 13.0–17.7)
Immature Grans (Abs): 0 10*3/uL (ref 0.0–0.1)
Immature Granulocytes: 0 %
Lymphocytes Absolute: 2.3 10*3/uL (ref 0.7–3.1)
Lymphs: 21 %
MCH: 31.6 pg (ref 26.6–33.0)
MCHC: 35.1 g/dL (ref 31.5–35.7)
MCV: 90 fL (ref 79–97)
Monocytes Absolute: 0.6 10*3/uL (ref 0.1–0.9)
Monocytes: 5 %
Neutrophils Absolute: 7.9 10*3/uL — ABNORMAL HIGH (ref 1.4–7.0)
Neutrophils: 72 %
Platelets: 392 10*3/uL (ref 150–450)
RBC: 4.9 x10E6/uL (ref 4.14–5.80)
RDW: 12.2 % (ref 11.6–15.4)
WBC: 10.9 10*3/uL — ABNORMAL HIGH (ref 3.4–10.8)

## 2021-09-25 LAB — CARDIOVASCULAR RISK ASSESSMENT

## 2021-09-25 LAB — LIPID PANEL
Chol/HDL Ratio: 3.8 ratio (ref 0.0–5.0)
Cholesterol, Total: 233 mg/dL — ABNORMAL HIGH (ref 100–199)
HDL: 61 mg/dL (ref >39)
LDL Chol Calc (NIH): 142 mg/dL — ABNORMAL HIGH (ref 0–99)
Triglycerides: 168 mg/dL — ABNORMAL HIGH (ref 0–149)
VLDL Cholesterol Cal: 30 mg/dL (ref 5–40)

## 2021-09-25 LAB — HEMOGLOBIN A1C
Est. average glucose Bld gHb Est-mCnc: 160 mg/dL
Hgb A1c MFr Bld: 7.2 % — ABNORMAL HIGH (ref 4.8–5.6)

## 2021-09-25 NOTE — Telephone Encounter (Signed)
Telephoned patient concerning elevated potassium 6.3. Pt instructed to stop potassium supplement, repeat CMP in 2 days, and seek emergency medical care for any symptoms. Pt verbalized understanding.Attempted to call pt's daughter, unable to reach her.  ?

## 2021-09-25 NOTE — Progress Notes (Signed)
High glucose 208, potassium high, shannon talked to him and recheck one week for potassium, liver tests OK, A1c 7.2, triglycerides 168, LDL cholesterol 142 high- is he taking his crestor '40mg'$  ?, WBC 10.9 any illness?, Microalbuminuria normal ?lp

## 2021-09-26 DIAGNOSIS — K219 Gastro-esophageal reflux disease without esophagitis: Secondary | ICD-10-CM | POA: Diagnosis not present

## 2021-09-26 DIAGNOSIS — E1140 Type 2 diabetes mellitus with diabetic neuropathy, unspecified: Secondary | ICD-10-CM | POA: Diagnosis not present

## 2021-09-26 DIAGNOSIS — Z7984 Long term (current) use of oral hypoglycemic drugs: Secondary | ICD-10-CM | POA: Diagnosis not present

## 2021-09-26 DIAGNOSIS — E785 Hyperlipidemia, unspecified: Secondary | ICD-10-CM | POA: Diagnosis not present

## 2021-09-26 DIAGNOSIS — J449 Chronic obstructive pulmonary disease, unspecified: Secondary | ICD-10-CM | POA: Diagnosis not present

## 2021-09-26 DIAGNOSIS — I110 Hypertensive heart disease with heart failure: Secondary | ICD-10-CM | POA: Diagnosis not present

## 2021-09-26 DIAGNOSIS — I509 Heart failure, unspecified: Secondary | ICD-10-CM | POA: Diagnosis not present

## 2021-09-26 DIAGNOSIS — Z7982 Long term (current) use of aspirin: Secondary | ICD-10-CM | POA: Diagnosis not present

## 2021-09-26 DIAGNOSIS — M545 Low back pain, unspecified: Secondary | ICD-10-CM | POA: Diagnosis not present

## 2021-09-26 DIAGNOSIS — F32A Depression, unspecified: Secondary | ICD-10-CM | POA: Diagnosis not present

## 2021-09-26 DIAGNOSIS — I2510 Atherosclerotic heart disease of native coronary artery without angina pectoris: Secondary | ICD-10-CM | POA: Diagnosis not present

## 2021-09-26 DIAGNOSIS — Z8673 Personal history of transient ischemic attack (TIA), and cerebral infarction without residual deficits: Secondary | ICD-10-CM | POA: Diagnosis not present

## 2021-09-29 ENCOUNTER — Telehealth: Payer: Self-pay

## 2021-09-29 DIAGNOSIS — I2510 Atherosclerotic heart disease of native coronary artery without angina pectoris: Secondary | ICD-10-CM | POA: Diagnosis not present

## 2021-09-29 DIAGNOSIS — M545 Low back pain, unspecified: Secondary | ICD-10-CM | POA: Diagnosis not present

## 2021-09-29 DIAGNOSIS — J449 Chronic obstructive pulmonary disease, unspecified: Secondary | ICD-10-CM | POA: Diagnosis not present

## 2021-09-29 DIAGNOSIS — I509 Heart failure, unspecified: Secondary | ICD-10-CM | POA: Diagnosis not present

## 2021-09-29 DIAGNOSIS — Z7984 Long term (current) use of oral hypoglycemic drugs: Secondary | ICD-10-CM | POA: Diagnosis not present

## 2021-09-29 DIAGNOSIS — K219 Gastro-esophageal reflux disease without esophagitis: Secondary | ICD-10-CM | POA: Diagnosis not present

## 2021-09-29 DIAGNOSIS — E785 Hyperlipidemia, unspecified: Secondary | ICD-10-CM | POA: Diagnosis not present

## 2021-09-29 DIAGNOSIS — Z8673 Personal history of transient ischemic attack (TIA), and cerebral infarction without residual deficits: Secondary | ICD-10-CM | POA: Diagnosis not present

## 2021-09-29 DIAGNOSIS — E1140 Type 2 diabetes mellitus with diabetic neuropathy, unspecified: Secondary | ICD-10-CM | POA: Diagnosis not present

## 2021-09-29 DIAGNOSIS — Z7982 Long term (current) use of aspirin: Secondary | ICD-10-CM | POA: Diagnosis not present

## 2021-09-29 DIAGNOSIS — I110 Hypertensive heart disease with heart failure: Secondary | ICD-10-CM | POA: Diagnosis not present

## 2021-09-29 DIAGNOSIS — F32A Depression, unspecified: Secondary | ICD-10-CM | POA: Diagnosis not present

## 2021-09-29 NOTE — Telephone Encounter (Signed)
Spoke with patient. He is agreeable and would like to stay within [facility] if possible.  ? ?Brent Ortiz 09/29/21 4:50 PM ? ?

## 2021-09-29 NOTE — Telephone Encounter (Signed)
Then recommend pain clinic, he abused narcotics in past ?lp ?

## 2021-09-29 NOTE — Telephone Encounter (Signed)
Pain clinic in [place] ?lp ?

## 2021-09-29 NOTE — Telephone Encounter (Signed)
Patient calling to report he reacted to butrans patch. He placed patch and he to remove due to itching. He did also have nausea/vomiting but he is unsure if this was related. He is requesting change in therapy. Please advise.  ? ?Brent Ortiz 09/29/21 8:54 AM ? ?

## 2021-09-30 ENCOUNTER — Other Ambulatory Visit: Payer: Self-pay

## 2021-09-30 DIAGNOSIS — I2510 Atherosclerotic heart disease of native coronary artery without angina pectoris: Secondary | ICD-10-CM | POA: Diagnosis not present

## 2021-09-30 DIAGNOSIS — M545 Low back pain, unspecified: Secondary | ICD-10-CM | POA: Diagnosis not present

## 2021-09-30 DIAGNOSIS — Z7984 Long term (current) use of oral hypoglycemic drugs: Secondary | ICD-10-CM | POA: Diagnosis not present

## 2021-09-30 DIAGNOSIS — K219 Gastro-esophageal reflux disease without esophagitis: Secondary | ICD-10-CM | POA: Diagnosis not present

## 2021-09-30 DIAGNOSIS — I110 Hypertensive heart disease with heart failure: Secondary | ICD-10-CM | POA: Diagnosis not present

## 2021-09-30 DIAGNOSIS — E1140 Type 2 diabetes mellitus with diabetic neuropathy, unspecified: Secondary | ICD-10-CM | POA: Diagnosis not present

## 2021-09-30 DIAGNOSIS — Z7982 Long term (current) use of aspirin: Secondary | ICD-10-CM | POA: Diagnosis not present

## 2021-09-30 DIAGNOSIS — I509 Heart failure, unspecified: Secondary | ICD-10-CM | POA: Diagnosis not present

## 2021-09-30 DIAGNOSIS — E785 Hyperlipidemia, unspecified: Secondary | ICD-10-CM | POA: Diagnosis not present

## 2021-09-30 DIAGNOSIS — J449 Chronic obstructive pulmonary disease, unspecified: Secondary | ICD-10-CM | POA: Diagnosis not present

## 2021-09-30 DIAGNOSIS — Z8673 Personal history of transient ischemic attack (TIA), and cerebral infarction without residual deficits: Secondary | ICD-10-CM | POA: Diagnosis not present

## 2021-09-30 DIAGNOSIS — G894 Chronic pain syndrome: Secondary | ICD-10-CM

## 2021-09-30 DIAGNOSIS — F32A Depression, unspecified: Secondary | ICD-10-CM | POA: Diagnosis not present

## 2021-09-30 NOTE — Telephone Encounter (Signed)
Order placed.  ? ?Brent Ortiz 09/30/21 8:17 AM ? ?

## 2021-10-01 ENCOUNTER — Ambulatory Visit: Payer: Medicare Other

## 2021-10-01 DIAGNOSIS — E875 Hyperkalemia: Secondary | ICD-10-CM | POA: Diagnosis not present

## 2021-10-01 LAB — COMPREHENSIVE METABOLIC PANEL
ALT: 11 IU/L (ref 0–44)
AST: 13 IU/L (ref 0–40)
Albumin/Globulin Ratio: 1.4 (ref 1.2–2.2)
Albumin: 4.1 g/dL (ref 3.8–4.8)
Alkaline Phosphatase: 65 IU/L (ref 44–121)
BUN/Creatinine Ratio: 9 — ABNORMAL LOW (ref 10–24)
BUN: 11 mg/dL (ref 8–27)
Bilirubin Total: 0.3 mg/dL (ref 0.0–1.2)
CO2: 25 mmol/L (ref 20–29)
Calcium: 10.3 mg/dL — ABNORMAL HIGH (ref 8.6–10.2)
Chloride: 96 mmol/L (ref 96–106)
Creatinine, Ser: 1.24 mg/dL (ref 0.76–1.27)
Globulin, Total: 3 g/dL (ref 1.5–4.5)
Glucose: 278 mg/dL — ABNORMAL HIGH (ref 70–99)
Potassium: 5.3 mmol/L — ABNORMAL HIGH (ref 3.5–5.2)
Sodium: 135 mmol/L (ref 134–144)
Total Protein: 7.1 g/dL (ref 6.0–8.5)
eGFR: 65 mL/min/{1.73_m2} (ref >59)

## 2021-10-02 NOTE — Progress Notes (Signed)
?  Subjective:  ?Patient ID: Brent Ortiz, male    DOB: 1960/09/27,  MRN: [id number] ? ?Brent Ortiz presents to clinic today for at risk foot care with history of diabetic neuropathy and painful elongated mycotic toenails 1-5 bilaterally which are tender when wearing enclosed shoe gear. Pain is relieved with periodic professional debridement. ? ?Patient states blood glucose was 133 mg/dl today ? ?He has known dx of neuropathy. ? ?PCP is [name], [name], MD , and last visit was September 24, 2021. ? ?Allergies  ?Allergen Reactions  ? Motrin [Ibuprofen] Other (See Comments)  ?  Reaction: ulcers  ? Other Nausea And Vomiting  ?  Patient has ulcers  ? ? ?Review of Systems: Negative except as noted in the HPI. ? ?Objective:  ?Objective:  ?Vascular Examination: ?CFT <3 seconds b/l LE. Faintly palpable DP pulses b/l LE. Faintly palpable PT pulse(s) b/l LE. Pedal hair sparse. No pain with calf compression b/l. Lower extremity skin temperature gradient within normal limits. No edema noted b/l LE. No ischemia or gangrene noted b/l LE. No cyanosis or clubbing noted b/l LE. ? ?Neurological Examination: ?Sensation grossly intact b/l with 10 gram monofilament. Vibratory sensation intact b/l. Protective sensation intact 5/5 intact bilaterally with 10g monofilament b/l. Vibratory sensation diminished b/l. Babinski reflex negative b/l. ? ?Dermatological Examination: ?Pedal skin with normal turgor, texture and tone b/l. Toenails 1-5 b/l thick, discolored, elongated with subungual debris and pain on dorsal palpation. No hyperkeratotic lesions noted b/l.  ? ?Musculoskeletal Examination: ?Muscle strength 5/5 to b/l LE. Muscle strength 5/5 to all lower extremity muscle groups bilaterally. Pes planus deformity noted bilateral LE. Utilizes cane for ambulation assistance. ? ?Radiographs: None ? ?Last A1c: ? ?  Latest Ref Rng & Units 09/24/2021  ? 10:14 AM 05/27/2021  ? 10:36 AM 01/24/2021  ?  9:19 AM  ?Hemoglobin A1C  ?Hemoglobin-A1c 4.8 - 5.6  % 7.2   6.8   7.6    ? ?Assessment/Plan: ?1. Pain due to onychomycosis of nail   ?2. Diabetic polyneuropathy associated with type 2 diabetes mellitus ([place])   ?-Patient was evaluated and treated. All patient's and/or POA's questions/concerns answered on today's visit. ?-Continue foot and shoe inspections daily. Monitor blood glucose per PCP/Endocrinologist's recommendations. ?-Patient to continue soft, supportive shoe gear daily. ?-Mycotic toenails 1-5 bilaterally were debrided in length and girth with sterile nail nippers and dremel without incident. ?-Patient/POA to call should there be question/concern in the interim.  ? ?Return in about 3 months (around 12/26/2021). ? ?[name], DPM  ?

## 2021-10-03 ENCOUNTER — Other Ambulatory Visit: Payer: Self-pay | Admitting: Legal Medicine

## 2021-10-03 DIAGNOSIS — E1142 Type 2 diabetes mellitus with diabetic polyneuropathy: Secondary | ICD-10-CM

## 2021-10-09 DIAGNOSIS — M545 Low back pain, unspecified: Secondary | ICD-10-CM | POA: Diagnosis not present

## 2021-10-09 DIAGNOSIS — K219 Gastro-esophageal reflux disease without esophagitis: Secondary | ICD-10-CM | POA: Diagnosis not present

## 2021-10-09 DIAGNOSIS — J449 Chronic obstructive pulmonary disease, unspecified: Secondary | ICD-10-CM | POA: Diagnosis not present

## 2021-10-09 DIAGNOSIS — I110 Hypertensive heart disease with heart failure: Secondary | ICD-10-CM | POA: Diagnosis not present

## 2021-10-09 DIAGNOSIS — F32A Depression, unspecified: Secondary | ICD-10-CM | POA: Diagnosis not present

## 2021-10-09 DIAGNOSIS — E1140 Type 2 diabetes mellitus with diabetic neuropathy, unspecified: Secondary | ICD-10-CM | POA: Diagnosis not present

## 2021-10-09 DIAGNOSIS — Z7984 Long term (current) use of oral hypoglycemic drugs: Secondary | ICD-10-CM | POA: Diagnosis not present

## 2021-10-09 DIAGNOSIS — E785 Hyperlipidemia, unspecified: Secondary | ICD-10-CM | POA: Diagnosis not present

## 2021-10-09 DIAGNOSIS — Z8673 Personal history of transient ischemic attack (TIA), and cerebral infarction without residual deficits: Secondary | ICD-10-CM | POA: Diagnosis not present

## 2021-10-09 DIAGNOSIS — I509 Heart failure, unspecified: Secondary | ICD-10-CM | POA: Diagnosis not present

## 2021-10-09 DIAGNOSIS — Z7982 Long term (current) use of aspirin: Secondary | ICD-10-CM | POA: Diagnosis not present

## 2021-10-09 DIAGNOSIS — I2510 Atherosclerotic heart disease of native coronary artery without angina pectoris: Secondary | ICD-10-CM | POA: Diagnosis not present

## 2021-10-28 DIAGNOSIS — D72829 Elevated white blood cell count, unspecified: Secondary | ICD-10-CM | POA: Diagnosis not present

## 2021-10-28 DIAGNOSIS — I509 Heart failure, unspecified: Secondary | ICD-10-CM | POA: Diagnosis not present

## 2021-10-28 DIAGNOSIS — F17210 Nicotine dependence, cigarettes, uncomplicated: Secondary | ICD-10-CM | POA: Diagnosis not present

## 2021-10-28 DIAGNOSIS — J441 Chronic obstructive pulmonary disease with (acute) exacerbation: Secondary | ICD-10-CM | POA: Diagnosis not present

## 2021-10-28 DIAGNOSIS — F32A Depression, unspecified: Secondary | ICD-10-CM | POA: Diagnosis not present

## 2021-10-28 DIAGNOSIS — I110 Hypertensive heart disease with heart failure: Secondary | ICD-10-CM | POA: Diagnosis not present

## 2021-10-28 DIAGNOSIS — K219 Gastro-esophageal reflux disease without esophagitis: Secondary | ICD-10-CM | POA: Diagnosis not present

## 2021-10-28 DIAGNOSIS — M1990 Unspecified osteoarthritis, unspecified site: Secondary | ICD-10-CM | POA: Diagnosis not present

## 2021-10-28 DIAGNOSIS — Z7984 Long term (current) use of oral hypoglycemic drugs: Secondary | ICD-10-CM | POA: Diagnosis not present

## 2021-10-28 DIAGNOSIS — I4510 Unspecified right bundle-branch block: Secondary | ICD-10-CM | POA: Diagnosis not present

## 2021-10-28 DIAGNOSIS — T380X5A Adverse effect of glucocorticoids and synthetic analogues, initial encounter: Secondary | ICD-10-CM | POA: Diagnosis not present

## 2021-10-28 DIAGNOSIS — Z7952 Long term (current) use of systemic steroids: Secondary | ICD-10-CM | POA: Diagnosis not present

## 2021-10-28 DIAGNOSIS — Z794 Long term (current) use of insulin: Secondary | ICD-10-CM | POA: Diagnosis not present

## 2021-10-28 DIAGNOSIS — E7800 Pure hypercholesterolemia, unspecified: Secondary | ICD-10-CM | POA: Diagnosis not present

## 2021-10-28 DIAGNOSIS — Z7902 Long term (current) use of antithrombotics/antiplatelets: Secondary | ICD-10-CM | POA: Diagnosis not present

## 2021-10-28 DIAGNOSIS — Z79899 Other long term (current) drug therapy: Secondary | ICD-10-CM | POA: Diagnosis not present

## 2021-10-28 DIAGNOSIS — J432 Centrilobular emphysema: Secondary | ICD-10-CM | POA: Diagnosis not present

## 2021-10-28 DIAGNOSIS — G4733 Obstructive sleep apnea (adult) (pediatric): Secondary | ICD-10-CM | POA: Diagnosis not present

## 2021-10-28 DIAGNOSIS — I208 Other forms of angina pectoris: Secondary | ICD-10-CM | POA: Diagnosis not present

## 2021-10-28 DIAGNOSIS — M109 Gout, unspecified: Secondary | ICD-10-CM | POA: Diagnosis not present

## 2021-10-28 DIAGNOSIS — R0789 Other chest pain: Secondary | ICD-10-CM | POA: Diagnosis not present

## 2021-10-28 DIAGNOSIS — R0602 Shortness of breath: Secondary | ICD-10-CM | POA: Diagnosis not present

## 2021-10-28 DIAGNOSIS — J9811 Atelectasis: Secondary | ICD-10-CM | POA: Diagnosis not present

## 2021-10-28 DIAGNOSIS — Z792 Long term (current) use of antibiotics: Secondary | ICD-10-CM | POA: Diagnosis not present

## 2021-10-28 DIAGNOSIS — Z7982 Long term (current) use of aspirin: Secondary | ICD-10-CM | POA: Diagnosis not present

## 2021-10-28 DIAGNOSIS — E119 Type 2 diabetes mellitus without complications: Secondary | ICD-10-CM | POA: Diagnosis not present

## 2021-10-30 ENCOUNTER — Telehealth: Payer: Self-pay

## 2021-10-30 DIAGNOSIS — I4510 Unspecified right bundle-branch block: Secondary | ICD-10-CM | POA: Diagnosis not present

## 2021-10-30 NOTE — Telephone Encounter (Signed)
Patient is currently in [place] gave note to patient for him to reach out to [place]. Note states for [place] to reach out to [place] Patient in regards to new CPAP machine. States this has been ongoing.  ? ?Unsure of where this process is and patient could not give much information.  ? ?Harrell Lark 10/30/21 9:05 AM ? ?

## 2021-11-03 NOTE — Telephone Encounter (Signed)
Faxed notes and order to [facility] today. ?

## 2021-11-05 ENCOUNTER — Telehealth: Payer: Self-pay

## 2021-11-05 ENCOUNTER — Other Ambulatory Visit: Payer: Self-pay | Admitting: Legal Medicine

## 2021-11-05 DIAGNOSIS — F321 Major depressive disorder, single episode, moderate: Secondary | ICD-10-CM

## 2021-11-05 DIAGNOSIS — I25118 Atherosclerotic heart disease of native coronary artery with other forms of angina pectoris: Secondary | ICD-10-CM

## 2021-11-05 NOTE — Telephone Encounter (Signed)
Ebony Hail called stating they went out to patient's house, he was not there and shortly after he drove up in his car. She states she told him for home health you had to be home bound and he told her he guessed he did not need home health then. She also said he would not be complaint anyway because he is still smoking a pack a day. She wanted to just let us know.  ?

## 2021-11-11 ENCOUNTER — Telehealth: Payer: Self-pay

## 2021-11-11 NOTE — Telephone Encounter (Signed)
[facility] called and stated they now have lift chairs, wanted me to let Marla know. If patient still needed it. Karen's number is [phone]. ?

## 2021-11-12 ENCOUNTER — Other Ambulatory Visit: Payer: Self-pay | Admitting: Legal Medicine

## 2021-11-12 DIAGNOSIS — K2100 Gastro-esophageal reflux disease with esophagitis, without bleeding: Secondary | ICD-10-CM

## 2021-11-12 NOTE — Telephone Encounter (Signed)
Refill sent to pharmacy.   

## 2021-11-17 ENCOUNTER — Encounter: Payer: Self-pay | Admitting: Family Medicine

## 2021-11-17 ENCOUNTER — Ambulatory Visit (INDEPENDENT_AMBULATORY_CARE_PROVIDER_SITE_OTHER): Payer: Medicare Other | Admitting: Family Medicine

## 2021-11-17 VITALS — BP 114/78 | HR 104 | Temp 96.4°F | Resp 18 | Ht 68.0 in | Wt 302.0 lb

## 2021-11-17 DIAGNOSIS — M25552 Pain in left hip: Secondary | ICD-10-CM | POA: Diagnosis not present

## 2021-11-17 DIAGNOSIS — G4733 Obstructive sleep apnea (adult) (pediatric): Secondary | ICD-10-CM | POA: Diagnosis not present

## 2021-11-17 DIAGNOSIS — E782 Mixed hyperlipidemia: Secondary | ICD-10-CM

## 2021-11-17 DIAGNOSIS — M545 Low back pain, unspecified: Secondary | ICD-10-CM | POA: Insufficient documentation

## 2021-11-17 DIAGNOSIS — M25551 Pain in right hip: Secondary | ICD-10-CM

## 2021-11-17 DIAGNOSIS — J441 Chronic obstructive pulmonary disease with (acute) exacerbation: Secondary | ICD-10-CM

## 2021-11-17 DIAGNOSIS — K219 Gastro-esophageal reflux disease without esophagitis: Secondary | ICD-10-CM

## 2021-11-17 DIAGNOSIS — E1142 Type 2 diabetes mellitus with diabetic polyneuropathy: Secondary | ICD-10-CM | POA: Diagnosis not present

## 2021-11-17 HISTORY — DX: Pain in right hip: M25.551

## 2021-11-17 HISTORY — DX: Low back pain, unspecified: M54.50

## 2021-11-17 HISTORY — DX: Gastro-esophageal reflux disease without esophagitis: K21.9

## 2021-11-17 NOTE — Assessment & Plan Note (Signed)
Continue trelegy one inhalation daily.  ?Continue albuterol hfa 2 puffs four times a day as needed.  ?

## 2021-11-17 NOTE — Assessment & Plan Note (Addendum)
Control: good ?Medicines: metformin 500 mg daily.  ?Continue to work on eating a healthy diet and exercise.  ? ?

## 2021-11-17 NOTE — Progress Notes (Signed)
? ?Subjective:  ?Patient ID: Brent Ortiz, male    DOB: 07/25/1960  Age: 61 y.o. MRN: [id number] ? ?Chief Complaint  ?Patient presents with  ? COPD  ? ? ?HPI ?Patient is a 61 year old African-American male who presents for hospital follow-up. ?Patient was admitted on April 25th and discharged on April 28.  Patient was admitted for COPD exacerbation.  He received IV steroids and antibiotics.  He was discharged home on prednisone and doxycycline.  His Symbicort was changed to Trelegy 1 inhalation daily.  He is taking this.  His cardiac work-up was negative.  He had negative troponins.  His echocardiogram that was done showed normal ejection fraction.  Patient has a history of congestive heart failure and is on Entresto and spironolactone.  Patient is not on home oxygen.  His breathing is better he is currently on not using his albuterol inhaler.  He does not have a nebulizer machine but he sometimes will share his daughter's if needed.  During his most recent admission he was put on Protonix 40 mg daily possibly for reflux being the cause of his chest pain. ? 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  At that time he was put on Plavix and aspirin.  He had a CT of his head, MRI of his brain and a CTA of his brain and neck.  The results showed a remote lacunar infarct as well as a right temporal lobe infarction that was evolving.  Patient had an echocardiogram at that time as well and his EF was normal.  He had a bubble study which showed no shunt.  He apparently in January was on meloxicam, Celebrex, and indomethacin.  He was told to discontinue these due to him being on blood thinners. ?It appears he may still be on Celebrex. ? ?Current Outpatient Medications on File Prior to Visit  ?Medication Sig Dispense Refill  ? Accu-Chek Softclix Lancets lancets Use as instructed 100 each 5  ? albuterol (VENTOLIN HFA) 108 (90 Base) MCG/ACT inhaler INHALE 2 PUFFS INTO THE LUNGS EVERY 4 HOURS AS  NEEDED FOR WHEEZING OR SHORTNESS OF BREATH. 8.5 g 6  ? allopurinol (ZYLOPRIM) 100 MG tablet Take 1 tablet (100 mg total) by mouth daily. 30 tablet 6  ? amLODipine (NORVASC) 5 MG tablet Take 5 mg by mouth daily.    ? Blood Glucose Monitoring Suppl (ACCU-CHEK GUIDE) w/Device KIT 1 each by Does not apply route in the morning, at noon, and at bedtime. 1 kit 0  ? clopidogrel (PLAVIX) 75 MG tablet Take 1 tablet (75 mg total) by mouth daily. 30 tablet 6  ? colchicine-probenecid 0.5-500 MG tablet Take 1 tablet by mouth 2 (two) times daily as needed.    ? diclofenac Sodium (VOLTAREN) 1 % GEL Apply 2 g topically 4 (four) times daily.    ? ENTRESTO 24-26 MG TAKE 1 TABLET BY MOUTH 2 TIMES DAILY.(OBLONG PINK TAB WITH LZ NVR) 180 tablet 1  ? ezetimibe (ZETIA) 10 MG tablet Take 10 mg by mouth daily.    ? famotidine (PEPCID) 20 MG tablet Take 20 mg by mouth 2 (two) times daily.    ? Glucagon, rDNA, (GLUCAGON EMERGENCY) 1 MG KIT Inject 1 mg into the muscle as needed (BG less than 70).    ? glucose blood (ACCU-CHEK GUIDE) test strip Use as instructed 100 each 5  ? GOODSENSE ASPIRIN LOW DOSE 81 MG EC tablet TAKE 1 TABLET ($RemoveB'[id number]$ ) BY MOUTH DAILY. SWALLOW WHOLE.(YELLOW ROUND  TAB WITH HEART) 90 tablet 2  ? isosorbide mononitrate (IMDUR) 60 MG 24 hr tablet Take 60 mg by mouth daily.    ? LANTUS SOLOSTAR 100 UNIT/ML Solostar Pen ADMINISTER 30 UNITS UNDER THE SKIN DAILY 15 mL 2  ? meclizine (ANTIVERT) 12.5 MG tablet Take 12.5 mg by mouth 3 (three) times daily as needed for dizziness.    ? metFORMIN (GLUCOPHAGE) 500 MG tablet TAKE 1 TABLET BY MOUTH EVERY DAY WITH SUPPER.(ROUND WHITE TAB WITH H 102) 90 tablet 2  ? metoprolol succinate (TOPROL-XL) 25 MG 24 hr tablet TAKE 1 TABLET (25 MG TOTAL) BY MOUTH DAILY.(OVAL WHITE TAB WITH E7) 31 tablet 3  ? nitroGLYCERIN (NITROSTAT) 0.4 MG SL tablet DISSOLVE 1 TABLET UNDER TONGUE EVERY 5 MINUTES AS NEEDED CHEST PAIN. 25 tablet 1  ? pantoprazole (PROTONIX) 40 MG tablet TAKE ONE TABLET BY MOUTH TWO TIMES  DAILY. (OVAL YELLOW TABLET WITH H126) 62 tablet 0  ? polyethylene glycol (MIRALAX / GLYCOLAX) 17 g packet Take 17 g by mouth daily.    ? potassium chloride SA (KLOR-CON M) 20 MEQ tablet TAKE 1 TABLET(20MEQ TOTAL) BY MOUTH 4 TIMES DAILY.(OBLONG WHITE TAB WITH KC M20 OR WITH G20M) (Patient taking differently: 3 (three) times daily.) 124 tablet 2  ? ranolazine (RANEXA) 500 MG 12 hr tablet TAKE 1 TABLET BY MOUTH 2 TIMES DAILY (PEACH COLORED TABLET WITH C49 OR 588) 186 tablet 0  ? rosuvastatin (CRESTOR) 40 MG tablet Take 1 tablet (40 mg total) by mouth daily. 90 tablet 2  ? Semaglutide,0.25 or 0.5MG /DOS, (OZEMPIC, 0.25 OR 0.5 MG/DOSE,) 2 MG/1.5ML SOPN Inject 0.5 mg into the skin once a week. 6 mL 3  ? sertraline (ZOLOFT) 25 MG tablet TAKE 1 TABLET (25MG ) BY MOUTH DAILY.(ROUND GREEN TABLET WITH S1) 93 tablet 1  ? spironolactone (ALDACTONE) 25 MG tablet TAKE 1/2 TABLET(12.5) BY MOUTH DAILY. (HALF TAN TAB) 30 tablet 3  ? SYMBICORT 160-4.5 MCG/ACT inhaler INHALE 2 PUFFS INTO THE LUNGS TWICE DAILY. 30.6 g 2  ? tamsulosin (FLOMAX) 0.4 MG CAPS capsule TAKE 1 CAPSULE (.4MG  TOTAL) BY MOUTH DAILY.(TAN AND GOLD CAPSULE WITH CL23 0.4) 93 capsule 3  ? traZODone (DESYREL) 150 MG tablet TAKE 1 TABLET(150MG ) BY MOUTH AT BEDTIME(OVAL WHITE TAB WITH 13 32 505050) 93 tablet 1  ? ULTICARE SHORT PEN NEEDLES 31G X 8 MM MISC USE AS DIRECTED (ONCE DAILY WITH LANTUS INJECTION AS INSTRUCTED) 100 each 4  ? ?No current facility-administered medications on file prior to visit.  ? ?Past Medical History:  ?Diagnosis Date  ? Acute combined systolic and diastolic CHF, NYHA class 2 ([place]) 01/22/2020  ? Alcohol abuse   ? Atherosclerosis of coronary artery of native heart with stable angina pectoris, unspecified vessel or lesion type ([place]) 01/23/2016  ? Overview:  Completely occluded circumflex artery proximal to obtuse marginal 1 basin cardiac catheter from 2017  ? Chronic kidney disease   ? Chronic pain 10/19/2019  ? Sees pain clinic  ? COPD (chronic  obstructive pulmonary disease) ([place])   ? Coronary artery disease involving native heart 02/08/2020  ? Coronary atherosclerosis of native coronary artery 01/23/2016  ? Overview:  Completely occluded circumflex artery proximal to obtuse marginal 1 basin cardiac catheter from 2017  ? Depression, major, single episode, moderate ([place]) 07/11/2020  ? Diabetic polyneuropathy ([place]) 12/06/2019  ? ED (erectile dysfunction) 12/06/2019  ? Essential hypertension 01/23/2016  ? Essential thrombocythemia ([place]) 09/29/2011  ? GERD without esophagitis   ? HFrEF (heart failure with reduced ejection fraction) (  [place]) 02/08/2020  ? Knee pain, left 02/12/2012  ? Formatting of this note might be different from the original. Left  ? Leukocytosis 09/04/2011  ? Mixed hyperlipidemia 06/18/2017  ? Morbid obesity ([place]) 11/21/2020  ? Morbid obesity with BMI of 40.0-44.9, adult ([place]) 01/13/2013  ? Myocardial infarction [facility])   ? 04-13-2011  ? Obstructive chronic bronchitis with exacerbation ([place]) 10/03/2019  ? Opiate abuse, continuous ([place])   ? Osteoarthritis   ? Pain in knee joint 02/12/2012  ? Formatting of this note might be different from the original. Left  ? Pneumonia   ? Pneumonia due to infectious organism 09/04/2011  ? Polysubstance abuse ([place])   ? Poor dentition   ? Prostatitis 12/04/2020  ? Thrombocytosis 09/29/2011  ? Tobacco dependence 01/23/2016  ? Type 2 diabetes mellitus with diabetic polyneuropathy ([facility])   ? ?Past Surgical History:  ?Procedure Laterality Date  ? HERNIA REPAIR  2009  ? I & D KNEE WITH POLY EXCHANGE  09/03/2011  ? Procedure: IRRIGATION AND DEBRIDEMENT KNEE WITH POLY EXCHANGE;  Surgeon: Mauri Pole, MD;  Location: [facility];  Service: Orthopedics;  Laterality: Left;  ? TOOTH EXTRACTION Bilateral 08/06/2017  ? Procedure: DENTAL RESTORATION/EXTRACTIONS;  Surgeon: Diona Browner, DDS;  Location: [place];  Service: Oral Surgery;  Laterality: Bilateral;  ? TOTAL KNEE ARTHROPLASTY   right  feb 2012  ? left march 2012 r  ? TOTAL KNEE  REVISION  06/23/2011  ? Procedure: TOTAL KNEE REVISION;  Surgeon: Mauri Pole;  Location: [facility];  Service: Orthopedics;  Laterality: Left;  femomal nerve block done in holding area without incident  ?

## 2021-11-17 NOTE — Assessment & Plan Note (Signed)
[facility]: call to check on referral. ?Stop celebrex. Pull from pill pack.  ?Get xrays at [facility] ?

## 2021-11-17 NOTE — Assessment & Plan Note (Signed)
CPAP was ordered. ?Nurse called to [facility] patient to find out about CPAP. ?

## 2021-11-17 NOTE — Patient Instructions (Addendum)
[facility] in [place].  ?[address], [place], [place] [address] ?Phone: [phone] ?Call to make an appt for stroke follow up.  ? ?[facility]: call to check on referral.  ?See if they have satellite offices closer to you.  ?[phone] ? ?Stop celebrex. Pull from pill pack.  ? ?Get xrays at [facility] ? ?

## 2021-11-17 NOTE — Assessment & Plan Note (Signed)
Get xrays at [facility] ?

## 2021-11-17 NOTE — Assessment & Plan Note (Signed)
The current medical regimen is effective;  continue present plan and medications.  

## 2021-11-17 NOTE — Assessment & Plan Note (Signed)
Felt to be the source of chest pain. Negative cardiac work up.  ?Continue protonix 40 mg daily.  ?

## 2021-11-18 DIAGNOSIS — M25552 Pain in left hip: Secondary | ICD-10-CM | POA: Diagnosis not present

## 2021-11-18 DIAGNOSIS — M25551 Pain in right hip: Secondary | ICD-10-CM | POA: Diagnosis not present

## 2021-11-18 DIAGNOSIS — M545 Low back pain, unspecified: Secondary | ICD-10-CM | POA: Diagnosis not present

## 2021-11-18 DIAGNOSIS — M47816 Spondylosis without myelopathy or radiculopathy, lumbar region: Secondary | ICD-10-CM | POA: Diagnosis not present

## 2021-11-18 DIAGNOSIS — G4733 Obstructive sleep apnea (adult) (pediatric): Secondary | ICD-10-CM | POA: Diagnosis not present

## 2021-11-18 LAB — CBC WITH DIFFERENTIAL/PLATELET
Basophils Absolute: 0 10*3/uL (ref 0.0–0.2)
Basos: 0 %
EOS (ABSOLUTE): 0.1 10*3/uL (ref 0.0–0.4)
Eos: 1 %
Hematocrit: 43.3 % (ref 37.5–51.0)
Hemoglobin: 14.5 g/dL (ref 13.0–17.7)
Immature Grans (Abs): 0 10*3/uL (ref 0.0–0.1)
Immature Granulocytes: 0 %
Lymphocytes Absolute: 1.8 10*3/uL (ref 0.7–3.1)
Lymphs: 18 %
MCH: 31 pg (ref 26.6–33.0)
MCHC: 33.5 g/dL (ref 31.5–35.7)
MCV: 93 fL (ref 79–97)
Monocytes Absolute: 0.6 10*3/uL (ref 0.1–0.9)
Monocytes: 6 %
Neutrophils Absolute: 7.2 10*3/uL — ABNORMAL HIGH (ref 1.4–7.0)
Neutrophils: 75 %
Platelets: 343 10*3/uL (ref 150–450)
RBC: 4.67 x10E6/uL (ref 4.14–5.80)
RDW: 13.8 % (ref 11.6–15.4)
WBC: 9.6 10*3/uL (ref 3.4–10.8)

## 2021-11-18 LAB — COMPREHENSIVE METABOLIC PANEL
ALT: 16 IU/L (ref 0–44)
AST: 15 IU/L (ref 0–40)
Albumin/Globulin Ratio: 1.9 (ref 1.2–2.2)
Albumin: 4.4 g/dL (ref 3.8–4.8)
Alkaline Phosphatase: 57 IU/L (ref 44–121)
BUN/Creatinine Ratio: 9 — ABNORMAL LOW (ref 10–24)
BUN: 12 mg/dL (ref 8–27)
Bilirubin Total: 0.3 mg/dL (ref 0.0–1.2)
CO2: 23 mmol/L (ref 20–29)
Calcium: 9.4 mg/dL (ref 8.6–10.2)
Chloride: 102 mmol/L (ref 96–106)
Creatinine, Ser: 1.34 mg/dL — ABNORMAL HIGH (ref 0.76–1.27)
Globulin, Total: 2.3 g/dL (ref 1.5–4.5)
Glucose: 156 mg/dL — ABNORMAL HIGH (ref 70–99)
Potassium: 5 mmol/L (ref 3.5–5.2)
Sodium: 142 mmol/L (ref 134–144)
Total Protein: 6.7 g/dL (ref 6.0–8.5)
eGFR: 60 mL/min/{1.73_m2} (ref >59)

## 2021-11-18 NOTE — Progress Notes (Signed)
Blood count normal.  ?Liver function normal.  ?Kidney function slightly abnormal.  ?

## 2021-12-02 ENCOUNTER — Telehealth: Payer: Self-pay

## 2021-12-02 NOTE — Telephone Encounter (Signed)
Brent Ortiz called and left a message that he needs pain medication.

## 2021-12-04 ENCOUNTER — Other Ambulatory Visit: Payer: Self-pay

## 2021-12-04 MED ORDER — COLCHICINE-PROBENECID 0.5-500 MG PO TABS: 1.0000 | ORAL_TABLET | Freq: Two times a day (BID) | ORAL | 1 refills | Status: DC | PRN

## 2021-12-04 NOTE — Telephone Encounter (Signed)
Daughter left VM.   Returned call. Patient is having a gout flare and is requesting the as needed medication be sent to [facility] in [place]. His as needed medication was pended and sent for fill.   Royce Macadamia, [place] 12/04/21 3:11 PM

## 2021-12-05 NOTE — Telephone Encounter (Signed)
Patient's daughter was notified.  She has picked up the gout medication.  She is going to discuss pain management with her Dad and call us back.

## 2021-12-06 DIAGNOSIS — M10031 Idiopathic gout, right wrist: Secondary | ICD-10-CM | POA: Diagnosis not present

## 2021-12-06 DIAGNOSIS — R52 Pain, unspecified: Secondary | ICD-10-CM | POA: Diagnosis not present

## 2021-12-06 DIAGNOSIS — Z743 Need for continuous supervision: Secondary | ICD-10-CM | POA: Diagnosis not present

## 2021-12-06 DIAGNOSIS — M10021 Idiopathic gout, right elbow: Secondary | ICD-10-CM | POA: Diagnosis not present

## 2021-12-06 DIAGNOSIS — R6889 Other general symptoms and signs: Secondary | ICD-10-CM | POA: Diagnosis not present

## 2021-12-09 ENCOUNTER — Encounter: Payer: Self-pay | Admitting: Podiatrist

## 2021-12-09 ENCOUNTER — Ambulatory Visit (INDEPENDENT_AMBULATORY_CARE_PROVIDER_SITE_OTHER): Payer: Medicare Other | Admitting: Podiatrist

## 2021-12-09 DIAGNOSIS — M79609 Pain in unspecified limb: Secondary | ICD-10-CM | POA: Diagnosis not present

## 2021-12-09 DIAGNOSIS — E1142 Type 2 diabetes mellitus with diabetic polyneuropathy: Secondary | ICD-10-CM | POA: Diagnosis not present

## 2021-12-09 DIAGNOSIS — B351 Tinea unguium: Secondary | ICD-10-CM | POA: Diagnosis not present

## 2021-12-10 DIAGNOSIS — G4733 Obstructive sleep apnea (adult) (pediatric): Secondary | ICD-10-CM | POA: Diagnosis not present

## 2021-12-12 ENCOUNTER — Other Ambulatory Visit: Payer: Self-pay | Admitting: Legal Medicine

## 2021-12-12 DIAGNOSIS — E1142 Type 2 diabetes mellitus with diabetic polyneuropathy: Secondary | ICD-10-CM

## 2021-12-12 DIAGNOSIS — E876 Hypokalemia: Secondary | ICD-10-CM

## 2021-12-12 NOTE — Progress Notes (Signed)
Subjective: Brent Ortiz is a 61 y.o. male patient with history of diabetes who presents to office today complaining of long,mildly painful nails  while ambulating in shoes; unable to trim.    [name], Kirsten, MD  is his primary care provider.  Patient Active Problem List   Diagnosis Date Noted   Bilateral hip pain 11/17/2021   Lumbar pain 11/17/2021   GERD (gastroesophageal reflux disease) 11/17/2021   Personal history of stroke with residual effects 07/24/2021   Coronary artery disease involving native coronary artery of native heart without angina pectoris 12/27/2020   Stage 3b chronic kidney disease ([place]) 12/27/2020   Morbid obesity ([place]) 11/21/2020   Depression, major, single episode, moderate ([place]) 07/11/2020   Coronary artery disease involving native heart 02/08/2020   HFrEF (heart failure with reduced ejection fraction) ([place]) 02/08/2020   Acute combined systolic and diastolic CHF, NYHA class 2 ([place]) 01/22/2020   ED (erectile dysfunction) 12/06/2019   Diabetic polyneuropathy ([place]) 12/06/2019   Chronic pain 10/19/2019   COPD exacerbation ([place]) 10/03/2019   Mixed hyperlipidemia 06/18/2017   Alcohol abuse    Opiate abuse, continuous ([facility])    Polysubstance abuse ([place])    Atherosclerosis of coronary artery of native heart with stable angina pectoris, unspecified vessel or lesion type ([place]) 01/23/2016   Essential hypertension 01/23/2016   Tobacco dependence 01/23/2016   Tobacco use disorder 01/23/2016   BMI 45.0-49.9, adult ([place]) 01/13/2013   Knee pain, left 02/12/2012   Essential thrombocythemia ([place]) 09/29/2011   Obstructive sleep apnea 07/18/2011   Current Outpatient Medications on File Prior to Visit  Medication Sig Dispense Refill   Accu-Chek Softclix Lancets lancets Use as instructed 100 each 5   albuterol (VENTOLIN HFA) 108 (90 Base) MCG/ACT inhaler INHALE 2 PUFFS INTO THE LUNGS EVERY 4 HOURS AS NEEDED FOR WHEEZING OR SHORTNESS OF BREATH. 8.5 g 6   allopurinol  (ZYLOPRIM) 100 MG tablet Take 1 tablet (100 mg total) by mouth daily. 30 tablet 6   amLODipine (NORVASC) 5 MG tablet Take 5 mg by mouth daily.     Blood Glucose Monitoring Suppl (ACCU-CHEK GUIDE) w/Device KIT 1 each by Does not apply route in the morning, at noon, and at bedtime. 1 kit 0   clopidogrel (PLAVIX) 75 MG tablet Take 1 tablet (75 mg total) by mouth daily. 30 tablet 6   colchicine-probenecid 0.5-500 MG tablet Take 1 tablet by mouth 2 (two) times daily as needed. 14 tablet 1   diclofenac Sodium (VOLTAREN) 1 % GEL Apply 2 g topically 4 (four) times daily.     ENTRESTO 24-26 MG TAKE 1 TABLET BY MOUTH 2 TIMES DAILY.(OBLONG PINK TAB WITH LZ NVR) 180 tablet 1   ezetimibe (ZETIA) 10 MG tablet Take 10 mg by mouth daily.     famotidine (PEPCID) 20 MG tablet Take 20 mg by mouth 2 (two) times daily.     gabapentin (NEURONTIN) 300 MG capsule Take 300 mg by mouth 2 (two) times daily.     Glucagon, rDNA, (GLUCAGON EMERGENCY) 1 MG KIT Inject 1 mg into the muscle as needed (BG less than 70).     glucose blood (ACCU-CHEK GUIDE) test strip Use as instructed 100 each 5   GOODSENSE ASPIRIN LOW DOSE 81 MG EC tablet TAKE 1 TABLET (81MG) BY MOUTH DAILY. SWALLOW WHOLE.(YELLOW ROUND TAB WITH HEART) 90 tablet 2   isosorbide mononitrate (IMDUR) 60 MG 24 hr tablet Take 60 mg by mouth daily.     LANTUS SOLOSTAR 100 UNIT/ML Solostar Pen  ADMINISTER 30 UNITS UNDER THE SKIN DAILY 15 mL 2   meclizine (ANTIVERT) 12.5 MG tablet Take 12.5 mg by mouth 3 (three) times daily as needed for dizziness.     metFORMIN (GLUCOPHAGE) 500 MG tablet TAKE 1 TABLET BY MOUTH EVERY DAY WITH SUPPER.(ROUND WHITE TAB WITH H 102) 90 tablet 2   metoprolol succinate (TOPROL-XL) 25 MG 24 hr tablet TAKE 1 TABLET (25 MG TOTAL) BY MOUTH DAILY.(OVAL WHITE TAB WITH E7) 31 tablet 3   nitroGLYCERIN (NITROSTAT) 0.4 MG SL tablet DISSOLVE 1 TABLET UNDER TONGUE EVERY 5 MINUTES AS NEEDED CHEST PAIN. 25 tablet 1   oxyCODONE (OXY IR/ROXICODONE) 5 MG immediate  release tablet Take 5 mg by mouth every 6 (six) hours.     pantoprazole (PROTONIX) 40 MG tablet TAKE ONE TABLET BY MOUTH TWO TIMES DAILY. (OVAL YELLOW TABLET WITH H126) 62 tablet 0   polyethylene glycol (MIRALAX / GLYCOLAX) 17 g packet Take 17 g by mouth daily.     potassium chloride SA (KLOR-CON M) 20 MEQ tablet TAKE 1 TABLET(20MEQ TOTAL) BY MOUTH 4 TIMES DAILY.(OBLONG WHITE TAB WITH KC M20 OR WITH G20M) (Patient taking differently: 3 (three) times daily.) 124 tablet 2   predniSONE (DELTASONE) 10 MG tablet Take by mouth.     ranolazine (RANEXA) 500 MG 12 hr tablet TAKE 1 TABLET BY MOUTH 2 TIMES DAILY (PEACH COLORED TABLET WITH C49 OR 588) 186 tablet 0   rosuvastatin (CRESTOR) 40 MG tablet Take 1 tablet (40 mg total) by mouth daily. 90 tablet 2   Semaglutide,0.25 or 0.5MG/DOS, (OZEMPIC, 0.25 OR 0.5 MG/DOSE,) 2 MG/1.5ML SOPN Inject 0.5 mg into the skin once a week. 6 mL 3   sertraline (ZOLOFT) 25 MG tablet TAKE 1 TABLET (25MG) BY MOUTH DAILY.(ROUND GREEN TABLET WITH S1) 93 tablet 1   spironolactone (ALDACTONE) 25 MG tablet TAKE 1/2 TABLET(12.5) BY MOUTH DAILY. (HALF TAN TAB) 30 tablet 3   SYMBICORT 160-4.5 MCG/ACT inhaler INHALE 2 PUFFS INTO THE LUNGS TWICE DAILY. 30.6 g 2   tamsulosin (FLOMAX) 0.4 MG CAPS capsule TAKE 1 CAPSULE (.4MG TOTAL) BY MOUTH DAILY.(TAN AND GOLD CAPSULE WITH CL23 0.4) 93 capsule 3   traZODone (DESYREL) 150 MG tablet TAKE 1 TABLET(150MG) BY MOUTH AT BEDTIME(OVAL WHITE TAB WITH 13 32 505050) 93 tablet 1   TRELEGY ELLIPTA 100-62.5-25 MCG/ACT AEPB Take 1 puff by mouth daily.     ULTICARE SHORT PEN NEEDLES 31G X 8 MM MISC USE AS DIRECTED (ONCE DAILY WITH LANTUS INJECTION AS INSTRUCTED) 100 each 4   No current facility-administered medications on file prior to visit.   Allergies  Allergen Reactions   Motrin [Ibuprofen] Other (See Comments)    Reaction: ulcers   Other Nausea And Vomiting    Patient has ulcers      Objective: General: Patient is awake, alert, and oriented  x 3 and in no acute distress.  Integument: Skin is warm, dry and supple bilateral. Nails are tender, long, thickened and  dystrophic with subungual debris, consistent with onychomycosis, 1-5 bilateral. No signs of infection. No open lesions or preulcerative lesions present bilateral. Remaining integument unremarkable.  Vasculature:  Dorsalis Pedis pulse 1/4 bilateral. Posterior Tibial pulse  1/4 bilateral.  Capillary fill time <3 sec 1-5 bilateral. Temperature gradient within normal limits. No varicosities present bilateral. No edema present bilateral.   Neurology: The patient has decreased sensation measured with a 5.07/10g Semmes Weinstein Monofilament at  2/5 pedal sites bilateral . Vibratory sensation diminished bilateral with tuning fork. No Babinski  sign present bilateral.   Musculoskeletal: No symptomatic pedal deformities noted bilateral. Muscular strength 5/5 in all lower extremity muscular groups bilateral without pain on range of motion . No tenderness with calf compression bilateral.  Assessment and Plan: Problem List Items Addressed This Visit       Endocrine   Diabetic polyneuropathy ([place])   Relevant Medications   gabapentin (NEURONTIN) 300 MG capsule   Other Visit Diagnoses     Pain due to onychomycosis of nail    -  Primary       -Examined patient. -Discussed and educated patient on diabetic foot care, especially with  regards to the vascular, neurological and musculoskeletal systems.  -Stressed the importance of good glycemic control and the detriment of not  controlling glucose levels in relation to the foot. -Mechanically debrided all nails 1-5 bilateral using sterile nail nipper and filed with dremel without incident  -Answered all patient questions -Patient to return  in 3 months for at risk foot care -Patient advised to call the office if any problems or questions arise in the meantime.  Bronson Ing, DPM

## 2021-12-15 ENCOUNTER — Other Ambulatory Visit: Payer: Self-pay | Admitting: Family Medicine

## 2021-12-15 DIAGNOSIS — K2100 Gastro-esophageal reflux disease with esophagitis, without bleeding: Secondary | ICD-10-CM

## 2021-12-18 ENCOUNTER — Other Ambulatory Visit: Payer: Self-pay

## 2021-12-18 ENCOUNTER — Other Ambulatory Visit: Payer: Self-pay | Admitting: Family Medicine

## 2021-12-18 DIAGNOSIS — M25551 Pain in right hip: Secondary | ICD-10-CM

## 2021-12-18 DIAGNOSIS — M545 Low back pain, unspecified: Secondary | ICD-10-CM

## 2021-12-19 DIAGNOSIS — G4733 Obstructive sleep apnea (adult) (pediatric): Secondary | ICD-10-CM | POA: Diagnosis not present

## 2021-12-22 ENCOUNTER — Other Ambulatory Visit: Payer: Self-pay | Admitting: Family Medicine

## 2021-12-23 ENCOUNTER — Other Ambulatory Visit: Payer: Self-pay

## 2021-12-23 DIAGNOSIS — I25118 Atherosclerotic heart disease of native coronary artery with other forms of angina pectoris: Secondary | ICD-10-CM

## 2021-12-23 MED ORDER — NITROGLYCERIN 0.4 MG SL SUBL: SUBLINGUAL_TABLET | SUBLINGUAL | 1 refills | Status: DC

## 2021-12-23 MED ORDER — TRELEGY ELLIPTA 100-62.5-25 MCG/ACT IN AEPB: 1.0000 | INHALATION_SPRAY | Freq: Every day | RESPIRATORY_TRACT | 2 refills | Status: DC

## 2021-12-24 ENCOUNTER — Telehealth: Payer: Self-pay

## 2021-12-24 NOTE — Chronic Care Management (AMB) (Unsigned)
Chronic Care Management Pharmacy Assistant   Name: Brent Ortiz  MRN: [id number] DOB: Oct 10, 1960  Reason for Encounter: Disease State/ General  Recent office visits:  11-17-2021 [name], MD. WBC= 10.9, Neutrophils Absolute= 7.2. Glucose= 156, Creatinine= 1.34, BUN/Creatinine Ratio= 9. STOP buprenorphine and celebrex. DG Hip Unilat W OR W/O Pelvis Min 4 Views Right, DG Lumbar Spine complete and DG Hip Unilat W OR W/O Pelvis Min 4 Views Left.  09-24-2021 [name], MD. Glucose= 208, Potassium= 6.3. WBC= 10.9, Neutrophils Absolute= 7.9. A1C= 7.2. Cholesterol= 233, Trig= 168, LDL= 142. STOP Gabapentin, tylenol and nicoderm CQ patch. START buprenorphine 1 patch weekly and Ozempic inject 0.5 mg weekly.  07-24-2021 [name], MD. Referral placed to neurology and pulmonology.  Recent consult visits:  12-09-2021 Bronson Ing, DPM (Podiatry). Mechanically debrided all nails 1-5 bilateral using sterile nail nipper and filed with dremel without incident. STOP colchicine.  09-25-2021 [name], DPM (Podiatry). Mycotic toenails 1-5 bilaterally were debrided in length and girth with sterile nail nippers and dremel without incident.  Hospital visits:  09-05-2021 for atherosclerotic heart disease of native coronary artery. Unable to view encounter.  07-21-2021 for Cerebral infraction. Unable to view encounter.  Medications: Outpatient Encounter Medications as of 12/24/2021  Medication Sig   Accu-Chek Softclix Lancets lancets Use as instructed   albuterol (VENTOLIN HFA) 108 (90 Base) MCG/ACT inhaler INHALE 2 PUFFS INTO THE LUNGS EVERY 4 HOURS AS NEEDED FOR WHEEZING OR SHORTNESS OF BREATH.   allopurinol (ZYLOPRIM) 100 MG tablet Take 1 tablet (100 mg total) by mouth daily.   amLODipine (NORVASC) 5 MG tablet Take 5 mg by mouth daily.   Blood Glucose Monitoring Suppl (ACCU-CHEK GUIDE) w/Device KIT 1 each by Does not apply route in the morning, at noon,  and at bedtime.   clopidogrel (PLAVIX) 75 MG tablet Take 1 tablet (75 mg total) by mouth daily.   colchicine-probenecid 0.5-500 MG tablet TAKE 1 TABLET BY MOUTH TWICE DAILY AS NEEDED   diclofenac Sodium (VOLTAREN) 1 % GEL Apply 2 g topically 4 (four) times daily.   ENTRESTO 24-26 MG TAKE 1 TABLET BY MOUTH 2 TIMES DAILY.(OBLONG PINK TAB WITH LZ NVR)   ezetimibe (ZETIA) 10 MG tablet Take 10 mg by mouth daily.   famotidine (PEPCID) 20 MG tablet Take 20 mg by mouth 2 (two) times daily.   gabapentin (NEURONTIN) 300 MG capsule Take 300 mg by mouth 2 (two) times daily.   Glucagon, rDNA, (GLUCAGON EMERGENCY) 1 MG KIT Inject 1 mg into the muscle as needed (BG less than 70).   glucose blood (ACCU-CHEK GUIDE) test strip Use as instructed   GOODSENSE ASPIRIN LOW DOSE 81 MG EC tablet TAKE 1 TABLET (81MG) BY MOUTH DAILY. SWALLOW WHOLE.(YELLOW ROUND TAB WITH HEART)   isosorbide mononitrate (IMDUR) 60 MG 24 hr tablet Take 60 mg by mouth daily.   LANTUS SOLOSTAR 100 UNIT/ML Solostar Pen ADMINISTER 30 UNITS UNDER THE SKIN DAILY   meclizine (ANTIVERT) 12.5 MG tablet Take 12.5 mg by mouth 3 (three) times daily as needed for dizziness.   metFORMIN (GLUCOPHAGE) 500 MG tablet TAKE 1 TABLET BY MOUTH EVERY DAY WITH SUPPER.(ROUND WHITE TAB WITH H 102)   metoprolol succinate (TOPROL-XL) 25 MG 24 hr tablet TAKE 1 TABLET (25 MG TOTAL) BY MOUTH DAILY.(OVAL WHITE TAB WITH 564)   nitroGLYCERIN (NITROSTAT) 0.4 MG SL tablet DISSOLVE 1 TABLET UNDER TONGUE EVERY 5 MINUTES AS NEEDED CHEST PAIN.   oxyCODONE (OXY IR/ROXICODONE) 5 MG immediate  release tablet Take 5 mg by mouth every 6 (six) hours.   pantoprazole (PROTONIX) 40 MG tablet TAKE ONE TABLET BY MOUTH TWO TIMES DAILY. (OVAL YELLOW TABLET WITH H126)   polyethylene glycol (MIRALAX / GLYCOLAX) 17 g packet Take 17 g by mouth daily.   predniSONE (DELTASONE) 10 MG tablet Take by mouth.   ranolazine (RANEXA) 500 MG 12 hr tablet TAKE 1 TABLET BY MOUTH 2 TIMES DAILY (PEACH COLORED  TABLET WITH C49 OR 588)   rosuvastatin (CRESTOR) 40 MG tablet Take 1 tablet (40 mg total) by mouth daily.   Semaglutide,0.25 or 0.5MG/DOS, (OZEMPIC, 0.25 OR 0.5 MG/DOSE,) 2 MG/1.5ML SOPN Inject 0.5 mg into the skin once a week.   sertraline (ZOLOFT) 25 MG tablet TAKE 1 TABLET (25MG) BY MOUTH DAILY.(ROUND GREEN TABLET WITH S1)   spironolactone (ALDACTONE) 25 MG tablet TAKE 1/2 TABLET(12.5) BY MOUTH DAILY. (HALF TAN TAB)   SYMBICORT 160-4.5 MCG/ACT inhaler INHALE 2 PUFFS INTO THE LUNGS TWICE DAILY.   tamsulosin (FLOMAX) 0.4 MG CAPS capsule TAKE 1 CAPSULE (.4MG TOTAL) BY MOUTH DAILY.(TAN AND GOLD CAPSULE WITH CL23 0.4)   traZODone (DESYREL) 150 MG tablet TAKE 1 TABLET(150MG) BY MOUTH AT BEDTIME(OVAL WHITE TAB WITH 13 32 505050)   TRELEGY ELLIPTA 100-62.5-25 MCG/ACT AEPB Take 1 puff by mouth daily.   ULTICARE SHORT PEN NEEDLES 31G X 8 MM MISC USE AS DIRECTED (ONCE DAILY WITH LANTUS INJECTION AS INSTRUCTED)   No facility-administered encounter medications on file as of 12/24/2021.  Contacted [facility] for General Review Call   Chart Review:  Have there been any documented new, changed, or discontinued medications since last visit? Yes (Refer to chart review) Has there been any documented recent hospitalizations or ED visits since last visit with Clinical Pharmacist? Yes (Refer to chart review) Brief Summary: Unable to view  Adherence Review:  Does the Clinical Pharmacist Assistant have access to adherence rates? Yes Adherence rates for STAR metric medications (List medication(s)/day supply/ last 2 fill dates). Adherence rates for medications indicated for disease state being reviewed (List medication(s)/day supply/ last 2 fill dates). Does the patient have >5 day gap between last estimated fill dates for any of the above medications or other medication gaps? {yes/no:20286} Reason for medication gaps.   Disease State Questions:  Able to connect with Patient? {yes/no:20286} Did  patient have any problems with their health recently? {yes/no:20286} Note problems and Concerns: Have you had any admissions or emergency room visits or worsening of your condition(s) since last visit? {yes/no:20286} Details of ED visit, hospital visit and/or worsening condition(s): Have you had any visits with new specialists or providers since your last visit? {yes/no:20286} Explain: Have you had any new health care problem(s) since your last visit? {yes/no:20286} New problem(s) reported: Have you run out of any of your medications since you last spoke with clinical pharmacist? {yes/no:20286} What caused you to run out of your medications? Are there any medications you are not taking as prescribed? {yes/no:20286} What kept you from taking your medications as prescribed? Are you having any issues or side effects with your medications? {yes/no:20286} Note of issues or side effects: Do you have any other health concerns or questions you want to discuss with your Clinical Pharmacist before your next visit? {yes/no:20286} Note additional concerns and questions from Patient. Are there any health concerns that you feel we can do a better job addressing? {yes/no:20286} Note Patient's response. Are you having any problems with any of the following since the last visit: (select all that  apply)  {General Call:27390}  Details: 12. Any falls since last visit? {yes/no:20286}  Details: 13. Any increased or uncontrolled pain since last visit? {yes/no:20286}  Details: 14. Next visit Type: {Telephone/Office:25179}       Visit with:        Date:        Time:  41. Additional Details? {yes/no:20286}   12-24-2021: 1st attempt left VM 12-25-2021: 2nd attempt left VM  Care Gaps:  Star Rating Drugs: Rosuvastatin 20 mg- Last filled 12-18-2021 31 DS Metfromin 500 mg- Last filled 12-18-2021 31 DS Ozempic 0.5 mg- Last filled 12-18-2021 30 DS  [place] Clinical Pharmacist  Assistant [phone]

## 2021-12-25 ENCOUNTER — Telehealth: Payer: Self-pay

## 2021-12-25 NOTE — Telephone Encounter (Signed)
[facility] with [facility], calling requesting patient's potassium script. She states she has been going back and forth with our office all week. [facility] gets his script packaged and they are trying to send this out on Monday. Please advise   Harrell Lark 12/25/21 8:40 AM

## 2021-12-25 NOTE — Telephone Encounter (Signed)
I called Brent Ortiz and cancelled Potassium prescription. Patient will come on 12/29/2021 to see Dr Tobie Poet and recheck Potassium.

## 2021-12-26 ENCOUNTER — Other Ambulatory Visit: Payer: Self-pay | Admitting: Legal Medicine

## 2021-12-26 ENCOUNTER — Other Ambulatory Visit: Payer: Self-pay

## 2021-12-29 ENCOUNTER — Other Ambulatory Visit: Payer: Self-pay | Admitting: Family Medicine

## 2021-12-29 ENCOUNTER — Ambulatory Visit (INDEPENDENT_AMBULATORY_CARE_PROVIDER_SITE_OTHER): Payer: Medicare Other | Admitting: Family Medicine

## 2021-12-29 ENCOUNTER — Encounter: Payer: Self-pay | Admitting: Family Medicine

## 2021-12-29 VITALS — BP 148/100 | HR 100 | Temp 97.3°F | Resp 18 | Ht 68.0 in | Wt 294.0 lb

## 2021-12-29 DIAGNOSIS — M10231 Drug-induced gout, right wrist: Secondary | ICD-10-CM | POA: Diagnosis not present

## 2021-12-29 DIAGNOSIS — K219 Gastro-esophageal reflux disease without esophagitis: Secondary | ICD-10-CM

## 2021-12-29 DIAGNOSIS — M10031 Idiopathic gout, right wrist: Secondary | ICD-10-CM | POA: Insufficient documentation

## 2021-12-29 DIAGNOSIS — E1142 Type 2 diabetes mellitus with diabetic polyneuropathy: Secondary | ICD-10-CM

## 2021-12-29 DIAGNOSIS — I25118 Atherosclerotic heart disease of native coronary artery with other forms of angina pectoris: Secondary | ICD-10-CM

## 2021-12-29 DIAGNOSIS — E782 Mixed hyperlipidemia: Secondary | ICD-10-CM | POA: Diagnosis not present

## 2021-12-29 DIAGNOSIS — I2510 Atherosclerotic heart disease of native coronary artery without angina pectoris: Secondary | ICD-10-CM

## 2021-12-29 HISTORY — DX: Idiopathic gout, right wrist: M10.031

## 2021-12-29 MED ORDER — PREDNISONE 10 MG PO TABS: 10.0000 mg | ORAL_TABLET | Freq: Two times a day (BID) | ORAL | 0 refills | Status: DC

## 2021-12-29 MED ORDER — OXYCODONE HCL 5 MG PO TABS
5.0000 mg | ORAL_TABLET | Freq: Four times a day (QID) | ORAL | 0 refills | Status: DC
Start: 2021-12-29 — End: 2022-01-01

## 2021-12-29 MED ORDER — SEMAGLUTIDE (1 MG/DOSE) 4 MG/3ML ~~LOC~~ SOPN: 1.0000 mg | PEN_INJECTOR | SUBCUTANEOUS | 2 refills | Status: DC

## 2021-12-29 MED ORDER — TRIAMCINOLONE ACETONIDE 40 MG/ML IJ SUSP
80.0000 mg | Freq: Once | INTRAMUSCULAR | Status: AC
  Administered 2021-12-29: 80 mg via INTRAMUSCULAR

## 2021-12-29 NOTE — Progress Notes (Signed)
Subjective:  Patient ID: Brent Ortiz, male    DOB: 1961-04-03  Age: 61 y.o. MRN: [id number]  Chief Complaint  Patient presents with   COPD   Diabetes   Gastroesophageal Reflux   HPI Type 2, insulin requiring diabetes, complicated by hyperlipidemia, hypertension, polyneuropathy.   Checks sugars once every 2 weeks.  Current medicines for diabetes Lantus 30 units daily, metformin 500 mg daily, and ozempic 0.5 mg weekly. Patient performs foot exams daily and last ophthalmologic exam was 11/12/2020.  Last A1C was 7.2%.  Hyperlipidemia.  Rosuvastatin 40 mg daily, Zetia 10 mg daily.  Hypertension.  On Spironolactone 25 mg  daily, metoprolol 25 mg twice a day, amlodipine 5 mg daily..  Patient is working on maintaining diet and exercise regimen and follows up as directed.   CHF: He takes Entresto 24-26 mg twice a day, Isosorbide 60 mg daily, Spironolactone 25 mg daily, Ranexa 500 mg twice daily, metoprolol XL 25 mg daily.  Chronic pain with history of abuse.  Failed 2 pain clinics.  Gout:  right arm and hand is severely swollen and hurts. Takes colchicine-probenecid 05/500 mg one oral twice daily. Icy hot rollon. On Allopurinol 100 mg daily.  Patient has had hypokalemia in the past. POTASSIUM CHLORIDE was decreased to 20 meq four times a day from 4 oral four times a day.   Current Outpatient Medications on File Prior to Visit  Medication Sig Dispense Refill   Accu-Chek Softclix Lancets lancets Use as instructed 100 each 5   albuterol (VENTOLIN HFA) 108 (90 Base) MCG/ACT inhaler INHALE 2 PUFFS INTO THE LUNGS EVERY 4 HOURS AS NEEDED FOR WHEEZING OR SHORTNESS OF BREATH. 8.5 g 6     Current Outpatient Medications on File Prior to Visit  Medication Sig Dispense Refill   Accu-Chek Softclix Lancets lancets Use as instructed 100 each 5   albuterol (VENTOLIN HFA) 108 (90 Base) MCG/ACT inhaler INHALE 2 PUFFS INTO THE LUNGS EVERY 4 HOURS AS NEEDED FOR WHEEZING OR SHORTNESS OF BREATH. 8.5 g 6    allopurinol (ZYLOPRIM) 100 MG tablet Take 1 tablet (100 mg total) by mouth daily. 30 tablet 6   amLODipine (NORVASC) 5 MG tablet Take 5 mg by mouth daily.     Blood Glucose Monitoring Suppl (ACCU-CHEK GUIDE) w/Device KIT 1 each by Does not apply route in the morning, at noon, and at bedtime. 1 kit 0   clopidogrel (PLAVIX) 75 MG tablet Take 1 tablet (75 mg total) by mouth daily. 30 tablet 6   colchicine-probenecid 0.5-500 MG tablet TAKE 1 TABLET BY MOUTH TWICE DAILY AS NEEDED 14 tablet 1   ezetimibe (ZETIA) 10 MG tablet Take 10 mg by mouth daily.     famotidine (PEPCID) 20 MG tablet Take 20 mg by mouth 2 (two) times daily.     Glucagon, rDNA, (GLUCAGON EMERGENCY) 1 MG KIT Inject 1 mg into the muscle as needed (BG less than 70).     glucose blood (ACCU-CHEK GUIDE) test strip Use as instructed 100 each 5   isosorbide mononitrate (IMDUR) 60 MG 24 hr tablet Take 60 mg by mouth daily.     LANTUS SOLOSTAR 100 UNIT/ML Solostar Pen ADMINISTER 30 UNITS UNDER THE SKIN DAILY 15 mL 3   metFORMIN (GLUCOPHAGE) 500 MG tablet TAKE 1 TABLET BY MOUTH EVERY DAY WITH SUPPER.(ROUND WHITE TAB WITH H 102) 90 tablet 2   metoprolol succinate (TOPROL-XL) 25 MG 24 hr tablet TAKE 1 TABLET (25 MG TOTAL) BY MOUTH DAILY.(OVAL WHITE TAB WITH  564) 31 tablet 3   nitroGLYCERIN (NITROSTAT) 0.4 MG SL tablet DISSOLVE 1 TABLET UNDER TONGUE EVERY 5 MINUTES AS NEEDED CHEST PAIN. 25 tablet 1   pantoprazole (PROTONIX) 40 MG tablet TAKE ONE TABLET BY MOUTH TWO TIMES DAILY. (OVAL YELLOW TABLET WITH H126) 62 tablet 0   polyethylene glycol (MIRALAX / GLYCOLAX) 17 g packet Take 17 g by mouth daily.     ranolazine (RANEXA) 500 MG 12 hr tablet TAKE 1 TABLET BY MOUTH 2 TIMES DAILY (PEACH COLORED TABLET WITH C49 OR 588) 186 tablet 0   rosuvastatin (CRESTOR) 40 MG tablet Take 1 tablet (40 mg total) by mouth daily. 90 tablet 2   spironolactone (ALDACTONE) 25 MG tablet TAKE 1/2 TABLET(12.5) BY MOUTH DAILY. (HALF TAN TAB) 30 tablet 3   tamsulosin  (FLOMAX) 0.4 MG CAPS capsule TAKE 1 CAPSULE (.4MG  TOTAL) BY MOUTH DAILY.(TAN AND GOLD CAPSULE WITH CL23 0.4) 93 capsule 3   traZODone (DESYREL) 150 MG tablet TAKE 1 TABLET(150MG ) BY MOUTH AT BEDTIME(OVAL WHITE TAB WITH 13 32 505050) 93 tablet 1   TRELEGY ELLIPTA 100-62.5-25 MCG/ACT AEPB Take 1 puff by mouth daily. 28 each 2   ULTICARE SHORT PEN NEEDLES 31G X 8 MM MISC USE AS DIRECTED (ONCE DAILY WITH LANTUS INJECTION AS INSTRUCTED) 100 each 4   No current facility-administered medications on file prior to visit.   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, unspecified vessel or lesion type (HCC) 01/23/2016   Overview:  Completely occluded circumflex artery proximal to obtuse marginal 1 basin cardiac catheter from 2017   Chronic kidney disease    Chronic pain 10/19/2019   Sees pain clinic   COPD (chronic obstructive pulmonary disease) (HCC)    Coronary artery disease involving native heart 02/08/2020   Coronary atherosclerosis of native coronary artery 01/23/2016   Overview:  Completely occluded circumflex artery proximal to obtuse marginal 1 basin cardiac catheter from 2017   Depression, major, single episode, moderate (HCC) 07/11/2020   Diabetic polyneuropathy (HCC) 12/06/2019   ED (erectile dysfunction) 12/06/2019   Essential hypertension 01/23/2016   Essential thrombocythemia (HCC) 09/29/2011   GERD without esophagitis    HFrEF (heart failure with reduced ejection fraction) (HCC) 02/08/2020   Knee pain, left 02/12/2012   Formatting of this note might be different from the original. Left   Leukocytosis 09/04/2011   Mixed hyperlipidemia 06/18/2017   Morbid obesity (HCC) 11/21/2020   Morbid obesity with BMI of 40.0-44.9, adult (HCC) 01/13/2013   Myocardial infarction (HCC)    04-13-2011   Obstructive chronic  bronchitis with exacerbation (HCC) 10/03/2019   Opiate abuse, continuous (HCC)    Osteoarthritis    Pain in knee joint 02/12/2012   Formatting of this note might be different from the original. Left   Pneumonia    Pneumonia due to infectious organism 09/04/2011   Polysubstance abuse (HCC)    Poor dentition    Prostatitis 12/04/2020   Thrombocytosis 09/29/2011   Tobacco dependence 01/23/2016   Type 2 diabetes mellitus with diabetic polyneuropathy [facility])    Past Surgical History:  Procedure Laterality Date   HERNIA REPAIR  2009   I & D KNEE WITH POLY EXCHANGE  09/03/2011   Procedure: IRRIGATION AND DEBRIDEMENT KNEE WITH POLY EXCHANGE;  Surgeon: Shelda Pal, MD;  Location: [facility] ORS;  Service: Orthopedics;  Laterality:  Left;   TOOTH EXTRACTION Bilateral 08/06/2017   Procedure: DENTAL RESTORATION/EXTRACTIONS;  Surgeon: Ocie Doyne, DDS;  Location[facility] OR;  Service: Oral Surgery;  Laterality: Bilateral;   TOTAL KNEE ARTHROPLASTY   right  feb 2012   left march 2012 r   TOTAL KNEE REVISION  06/23/2011   Procedure: TOTAL KNEE REVISION;  Surgeon: Shelda Pal;  Location: [facility] ORS;  Service: Orthopedics;  Laterality: Left;  femomal nerve block done in holding area without incident    Family History  Problem Relation Age of Onset   Colon cancer Other    Colon cancer Mother    Social History   Socioeconomic History   Marital status: Widowed    Spouse name: Not on file   Number of children: 5   Years of education: Not on file   Highest education level: Not on file  Occupational History   Occupation: Disabled  Tobacco Use   Smoking status: Every Day    Packs/day: 0.50    Years: 50.00    Total pack years: 25.00    Types: Cigarettes   Smokeless tobacco: Never  Vaping Use   Vaping Use: Former  Substance and Sexual Activity   Alcohol use: Yes    Comment: heavy drinker in past. occasionally has a beer. Not on regular basis.   Drug use: Not Currently    Types: Cocaine   Sexual  activity: Not Currently  Other Topics Concern   Not on file  Social History Narrative   Not on file   Social Determinants of Health   Financial Resource Strain: Low Risk  (06/03/2020)   Overall Financial Resource Strain (CARDIA)    Difficulty of Paying Living Expenses: Not very hard  Food Insecurity: No Food Insecurity (06/03/2020)   Hunger Vital Sign    Worried About Running Out of Food in the Last Year: Never true    Ran Out of Food in the Last Year: Never true  Transportation Needs: No Transportation Needs (06/03/2020)   PRAPARE - Administrator, Civil Service (Medical): No    Lack of Transportation (Non-Medical): No  Physical Activity: Inactive (06/03/2020)   Exercise Vital Sign    Days of Exercise per Week: 0 days    Minutes of Exercise per Session: 0 min  Stress: No Stress Concern Present (06/03/2020)   [employer] of Occupational Health - Occupational Stress Questionnaire    Feeling of Stress : Not at all  Social Connections: Moderately Integrated (06/03/2020)   Social Connection and Isolation Panel [NHANES]    Frequency of Communication with Friends and Family: Twice a week    Frequency of Social Gatherings with Friends and Family: Once a week    Attends Religious Services: More than 4 times per year    Active Member of [employer] or Organizations: Yes    Attends Banker Meetings: More than 4 times per year    Marital Status: Widowed    Review of Systems  Constitutional:  Positive for chills and fatigue. Negative for fever.  HENT:  Negative for congestion, rhinorrhea and sore throat.   Respiratory:  Positive for cough and shortness of breath.   Cardiovascular:  Positive for chest pain (mid-sternal chest pain last pm that lasted 7-8 mins. (relieved with NTG 0.4 x 1 tablet).). Negative for palpitations.  Gastrointestinal:  Negative for abdominal pain, constipation, diarrhea, nausea and vomiting.  Genitourinary:  Negative for dysuria and  urgency.  Musculoskeletal:  Positive for back pain. Negative for arthralgias (  left hip pain) and myalgias.       Swollen right hand which started about 4 days ago  Neurological:  Positive for headaches. Negative for dizziness.  Psychiatric/Behavioral:  Positive for dysphoric mood. The patient is not nervous/anxious.      Objective:  BP (!) 148/100   Pulse 100   Temp (!) 97.3 F (36.3 C)   Resp 18   Ht 5\' 8"  (1.727 m)   Wt 294 lb (133.4 kg)   BMI 44.70 kg/m      12/29/2021   10:22 AM 12/29/2021    9:09 AM 11/17/2021    7:30 AM  BP/Weight  Systolic BP 148 168 114  Diastolic BP 100 98 78  Wt. (Lbs)  294 302  BMI  44.7 kg/m2 45.92 kg/m2    Physical Exam Vitals reviewed.  Constitutional:      Appearance: Normal appearance. He is obese.  Neck:     Vascular: No carotid bruit.  Cardiovascular:     Rate and Rhythm: Normal rate and regular rhythm.     Heart sounds: Normal heart sounds.  Pulmonary:     Effort: Pulmonary effort is normal.     Breath sounds: Normal breath sounds. No wheezing, rhonchi or rales.  Abdominal:     General: Bowel sounds are normal.     Palpations: Abdomen is soft.     Tenderness: There is no abdominal tenderness.  Musculoskeletal:        General: Swelling (right hand and wrist edema, hot, erythema.) present.  Neurological:     Mental Status: He is alert and oriented to person, place, and time.  Psychiatric:        Mood and Affect: Mood normal.        Behavior: Behavior normal.     Diabetic Foot Exam - Simple   Simple Foot Form Diabetic Foot exam was performed with the following findings: Yes 12/29/2021 10:36 AM  Visual Inspection See comments: Yes Sensation Testing See comments: Yes Pulse Check Posterior Tibialis and Dorsalis pulse intact bilaterally: Yes Comments Decreased sensation.  Calluses.dry skin.      Lab Results  Component Value Date   WBC 9.6 11/17/2021   HGB 14.5 11/17/2021   HCT 43.3 11/17/2021   PLT 343 11/17/2021    GLUCOSE 156 (H) 11/17/2021   CHOL 233 (H) 09/24/2021   TRIG 168 (H) 09/24/2021   HDL 61 09/24/2021   LDLCALC 142 (H) 09/24/2021   ALT 16 11/17/2021   AST 15 11/17/2021   NA 142 11/17/2021   K 5.0 11/17/2021   CL 102 11/17/2021   CREATININE 1.34 (H) 11/17/2021   BUN 12 11/17/2021   CO2 23 11/17/2021   TSH 0.719 01/24/2021   INR 1.81 (H) 09/07/2011   HGBA1C 7.2 (H) 09/24/2021   MICROALBUR 80 09/27/2020      Assessment & Plan:   Problem List Items Addressed This Visit       Cardiovascular and Mediastinum   Atherosclerosis of coronary artery of native heart with stable angina pectoris, unspecified vessel or lesion type [facility])    The current medical regimen is effective;  continue present plan and medications. Management per specialist.        Relevant Medications   oxyCODONE (OXY IR/ROXICODONE) 5 MG immediate release tablet   predniSONE (DELTASONE) 10 MG tablet   RESOLVED: Coronary artery disease involving native coronary artery of native heart without angina pectoris - Primary     Digestive   GERD (gastroesophageal reflux disease)  The current medical regimen is effective;  continue present plan and medications. On protonix 40 mg twice daily.         Endocrine   Diabetic polyneuropathy (HCC)    Control: fairly good.  Recommend check sugars fasting daily. Recommend check feet daily. Recommend annual eye exams. Medicines: Lantus 30 units daily, metformin 500 mg daily, and increase ozempic 1 mg weekly.  Continue to work on eating a healthy diet and exercise.  Labs drawn today.         Relevant Medications   Semaglutide, 1 MG/DOSE, 4 MG/3ML SOPN   Other Relevant Orders   Hemoglobin A1c     Musculoskeletal and Integument   Acute idiopathic gout of right wrist    Prednisone 10 mg twice daily x1 week.   Oxycodone immediate release 5 mg 4 times daily as needed severe pain.  #20 with 0 refills. Check uric acid.        Relevant Medications   oxyCODONE (OXY  IR/ROXICODONE) 5 MG immediate release tablet   predniSONE (DELTASONE) 10 MG tablet   Other Relevant Orders   Uric acid     Other   Mixed hyperlipidemia    Not at goal.  Continue Rosuvastatin 40 mg daily, Zetia 10 mg daily. Continue to work on eating a healthy diet and exercise.  Labs drawn today.        Relevant Orders   Lipid panel   Comprehensive metabolic panel   CBC with Differential/Platelet  .  Meds ordered this encounter  Medications   oxyCODONE (OXY IR/ROXICODONE) 5 MG immediate release tablet    Sig: Take 1 tablet (5 mg total) by mouth every 6 (six) hours.    Dispense:  20 tablet    Refill:  0   predniSONE (DELTASONE) 10 MG tablet    Sig: Take 1 tablet (10 mg total) by mouth 2 (two) times daily with a meal.    Dispense:  14 tablet    Refill:  0   Semaglutide, 1 MG/DOSE, 4 MG/3ML SOPN    Sig: Inject 1 mg as directed once a week.    Dispense:  3 mL    Refill:  2   triamcinolone acetonide (KENALOG-40) injection 80 mg    Orders Placed This Encounter  Procedures   Lipid panel   Hemoglobin A1c   Comprehensive metabolic panel   CBC with Differential/Platelet   Uric acid     Follow-up: Return in about 6 weeks (around 02/09/2022).  An After Visit Summary was printed and given to the patient.  Blane Ohara, MD [facility] [phone]

## 2021-12-29 NOTE — Assessment & Plan Note (Signed)
The current medical regimen is effective;  continue present plan and medications. Management per specialist.   

## 2021-12-30 LAB — COMPREHENSIVE METABOLIC PANEL
ALT: 10 IU/L (ref 0–44)
AST: 12 IU/L (ref 0–40)
Albumin/Globulin Ratio: 1.5 (ref 1.2–2.2)
Albumin: 4.1 g/dL (ref 3.8–4.8)
Alkaline Phosphatase: 83 IU/L (ref 44–121)
BUN/Creatinine Ratio: 7 — ABNORMAL LOW (ref 10–24)
BUN: 8 mg/dL (ref 8–27)
Bilirubin Total: 0.6 mg/dL (ref 0.0–1.2)
CO2: 22 mmol/L (ref 20–29)
Calcium: 10 mg/dL (ref 8.6–10.2)
Chloride: 100 mmol/L (ref 96–106)
Creatinine, Ser: 1.22 mg/dL (ref 0.76–1.27)
Globulin, Total: 2.8 g/dL (ref 1.5–4.5)
Glucose: 144 mg/dL — ABNORMAL HIGH (ref 70–99)
Potassium: 3.6 mmol/L (ref 3.5–5.2)
Sodium: 139 mmol/L (ref 134–144)
Total Protein: 6.9 g/dL (ref 6.0–8.5)
eGFR: 67 mL/min/{1.73_m2} (ref >59)

## 2021-12-30 LAB — LIPID PANEL
Chol/HDL Ratio: 4.8 ratio (ref 0.0–5.0)
Cholesterol, Total: 193 mg/dL (ref 100–199)
HDL: 40 mg/dL (ref >39)
LDL Chol Calc (NIH): 124 mg/dL — ABNORMAL HIGH (ref 0–99)
Triglycerides: 163 mg/dL — ABNORMAL HIGH (ref 0–149)
VLDL Cholesterol Cal: 29 mg/dL (ref 5–40)

## 2021-12-30 LAB — CBC WITH DIFFERENTIAL/PLATELET
Basophils Absolute: 0 10*3/uL (ref 0.0–0.2)
Basos: 0 %
EOS (ABSOLUTE): 0.2 10*3/uL (ref 0.0–0.4)
Eos: 2 %
Hematocrit: 42.5 % (ref 37.5–51.0)
Hemoglobin: 14.4 g/dL (ref 13.0–17.7)
Immature Grans (Abs): 0.1 10*3/uL (ref 0.0–0.1)
Immature Granulocytes: 1 %
Lymphocytes Absolute: 2.1 10*3/uL (ref 0.7–3.1)
Lymphs: 17 %
MCH: 30.3 pg (ref 26.6–33.0)
MCHC: 33.9 g/dL (ref 31.5–35.7)
MCV: 89 fL (ref 79–97)
Monocytes Absolute: 0.9 10*3/uL (ref 0.1–0.9)
Monocytes: 7 %
Neutrophils Absolute: 8.8 10*3/uL — ABNORMAL HIGH (ref 1.4–7.0)
Neutrophils: 73 %
Platelets: 410 10*3/uL (ref 150–450)
RBC: 4.76 x10E6/uL (ref 4.14–5.80)
RDW: 13.9 % (ref 11.6–15.4)
WBC: 12.1 10*3/uL — ABNORMAL HIGH (ref 3.4–10.8)

## 2021-12-30 LAB — HEMOGLOBIN A1C
Est. average glucose Bld gHb Est-mCnc: 137 mg/dL
Hgb A1c MFr Bld: 6.4 % — ABNORMAL HIGH (ref 4.8–5.6)

## 2021-12-30 LAB — URIC ACID: Uric Acid: 4.8 mg/dL (ref 3.8–8.4)

## 2021-12-30 LAB — CARDIOVASCULAR RISK ASSESSMENT

## 2022-01-01 ENCOUNTER — Ambulatory Visit (INDEPENDENT_AMBULATORY_CARE_PROVIDER_SITE_OTHER): Payer: Medicare Other | Admitting: Family Medicine

## 2022-01-01 ENCOUNTER — Encounter: Payer: Self-pay | Admitting: Family Medicine

## 2022-01-01 VITALS — BP 160/100 | HR 96 | Temp 98.7°F | Resp 16 | Ht 68.0 in | Wt 293.0 lb

## 2022-01-01 DIAGNOSIS — L03113 Cellulitis of right upper limb: Secondary | ICD-10-CM | POA: Diagnosis not present

## 2022-01-01 DIAGNOSIS — M7989 Other specified soft tissue disorders: Secondary | ICD-10-CM | POA: Diagnosis not present

## 2022-01-01 DIAGNOSIS — E1142 Type 2 diabetes mellitus with diabetic polyneuropathy: Secondary | ICD-10-CM | POA: Diagnosis not present

## 2022-01-01 HISTORY — DX: Cellulitis of right upper limb: L03.113

## 2022-01-01 MED ORDER — CEPHALEXIN 500 MG PO CAPS: 500.0000 mg | ORAL_CAPSULE | Freq: Four times a day (QID) | ORAL | 0 refills | Status: DC

## 2022-01-01 MED ORDER — OXYCODONE HCL 10 MG PO TABS: ORAL_TABLET | ORAL | 0 refills | Status: DC

## 2022-01-01 MED ORDER — CEFTRIAXONE SODIUM 1 G IJ SOLR
1.0000 g | Freq: Once | INTRAMUSCULAR | Status: AC
  Administered 2022-01-01: 1 g via INTRAMUSCULAR

## 2022-01-01 NOTE — Assessment & Plan Note (Addendum)
Rocephin 1 g given. Stop the prednisone. Start Keflex 500 mg 4 times a day for 10 days. Go to [facility] for an x-ray and stat labs. Results came back with WBC 15,000 and elevated platelets.  Mild left shift.  Sed rate and C-reactive protein were elevated.  Patient to call in the morning to update [place] on how he is doing.  If his hand is no better in 24 hours, I will have him go to the hospital for probable admission for severe cellulitis. X-ray showed edema of the tissue of right hand.  Oxycodone increased to 10 mg every 6 hours as needed for severe pain. Per patient he has approximately 6 oxycodone 5 mg still.

## 2022-01-01 NOTE — Patient Instructions (Addendum)
Stop the prednisone. Start Keflex 500 mg 4 times a day for 10 days.  Go to [facility] for an x-ray and stat labs.

## 2022-01-01 NOTE — Assessment & Plan Note (Signed)
Increased risk for severity of infection.  Watch closely.

## 2022-01-01 NOTE — Progress Notes (Signed)
Acute Office Visit  Subjective:    Patient ID: Brent Ortiz, male    DOB: 01-Dec-1960, 61 y.o.   MRN: [id number]  Chief Complaint  Patient presents with   Hand Pain    Right    HPI: Patient is in today for Right hand pain and swelling since 6 days ago. It happened suddenly. He denied any injury. He came to the office on 12/29/2021 and was treated for gout with prednisone.  WBC came back elevated to 12.1 and try to get the patient to come back in yesterday.  He had no transportation.  He then came in this morning.  His hand is no better.  He denies any fevers.  He was already taking colchicine's-probenecid and allopurinol.  I added the prednisone and gave him some oxycodone 5 mg 4 times daily as needed severe pain #20.  He says this is not helping enough with his pain.  Uric acid level was 4.8.    Past Medical History:  Diagnosis Date   Acute combined systolic and diastolic CHF, NYHA class 2 ([place]) 01/22/2020   Alcohol abuse    Atherosclerosis of coronary artery of native heart with stable angina pectoris, unspecified vessel or lesion type ([place]) 01/23/2016   Overview:  Completely occluded circumflex artery proximal to obtuse marginal 1 basin cardiac catheter from 2017   Chronic kidney disease    Chronic pain 10/19/2019   Sees pain clinic   COPD (chronic obstructive pulmonary disease) ([place])    Coronary artery disease involving native heart 02/08/2020   Coronary atherosclerosis of native coronary artery 01/23/2016   Overview:  Completely occluded circumflex artery proximal to obtuse marginal 1 basin cardiac catheter from 2017   Depression, major, single episode, moderate ([place]) 07/11/2020   Diabetic polyneuropathy ([place]) 12/06/2019   ED (erectile dysfunction) 12/06/2019   Essential hypertension 01/23/2016   Essential thrombocythemia ([place]) 09/29/2011   GERD without esophagitis    HFrEF (heart failure with reduced ejection fraction) ([place]) 02/08/2020   Knee pain, left 02/12/2012    Formatting of this note might be different from the original. Left   Leukocytosis 09/04/2011   Mixed hyperlipidemia 06/18/2017   Morbid obesity ([place]) 11/21/2020   Morbid obesity with BMI of 40.0-44.9, adult ([place]) 01/13/2013   Myocardial infarction ([place])    04-13-2011   Obstructive chronic bronchitis with exacerbation ([place]) 10/03/2019   Opiate abuse, continuous ([facility])    Osteoarthritis    Pain in knee joint 02/12/2012   Formatting of this note might be different from the original. Left   Pneumonia    Pneumonia due to infectious organism 09/04/2011   Polysubstance abuse ([place])    Poor dentition    Prostatitis 12/04/2020   Thrombocytosis 09/29/2011   Tobacco dependence 01/23/2016   Type 2 diabetes mellitus with diabetic polyneuropathy [facility])     Past Surgical History:  Procedure Laterality Date   HERNIA REPAIR  2009   I & D KNEE WITH POLY EXCHANGE  09/03/2011   Procedure: IRRIGATION AND DEBRIDEMENT KNEE WITH POLY EXCHANGE;  Surgeon: Mauri Pole, MD;  Location: [facility];  Service: Orthopedics;  Laterality: Left;   TOOTH EXTRACTION Bilateral 08/06/2017   Procedure: DENTAL RESTORATION/EXTRACTIONS;  Surgeon: Diona Browner, DDS;  Location: [place];  Service: Oral Surgery;  Laterality: Bilateral;   TOTAL KNEE ARTHROPLASTY   right  feb 2012   left march 2012 r   TOTAL KNEE REVISION  06/23/2011   Procedure: TOTAL KNEE REVISION;  Surgeon: Mauri Pole;  Location:  [facility];  Service: Orthopedics;  Laterality: Left;  femomal nerve block done in holding area without incident    Family History  Problem Relation Age of Onset   Colon cancer Other    Colon cancer Mother     Social History   Socioeconomic History   Marital status: Widowed    Spouse name: Not on file   Number of children: 5   Years of education: Not on file   Highest education level: Not on file  Occupational History   Occupation: Disabled  Tobacco Use   Smoking status: Every Day    Packs/day: 0.50    Years: 50.00    Total  pack years: 25.00    Types: Cigarettes   Smokeless tobacco: Never  Vaping Use   Vaping Use: Former  Substance and Sexual Activity   Alcohol use: Yes    Comment: heavy drinker in past. occasionally has a beer. Not on regular basis.   Drug use: Not Currently    Types: Cocaine   Sexual activity: Not Currently  Other Topics Concern   Not on file  Social History Narrative   Not on file   Social Determinants of Health   Financial Resource Strain: Low Risk  (06/03/2020)   Overall Financial Resource Strain (CARDIA)    Difficulty of Paying Living Expenses: Not very hard  Food Insecurity: No Food Insecurity (06/03/2020)   Hunger Vital Sign    Worried About Running Out of Food in the Last Year: Never true    Ran Out of Food in the Last Year: Never true  Transportation Needs: No Transportation Needs (06/03/2020)   PRAPARE - Hydrologist (Medical): No    Lack of Transportation (Non-Medical): No  Physical Activity: Inactive (06/03/2020)   Exercise Vital Sign    Days of Exercise per Week: 0 days    Minutes of Exercise per Session: 0 min  Stress: No Stress Concern Present (06/03/2020)   [place]    Feeling of Stress : Not at all  Social Connections: Moderately Integrated (06/03/2020)   Social Connection and Isolation Panel [NHANES]    Frequency of Communication with Friends and Family: Twice a week    Frequency of Social Gatherings with Friends and Family: Once a week    Attends Religious Services: More than 4 times per year    Active Member of [employer] or Organizations: Yes    Attends Archivist Meetings: More than 4 times per year    Marital Status: Widowed  Intimate Partner Violence: Not At Risk (06/03/2020)   Humiliation, Afraid, Rape, and Kick questionnaire    Fear of Current or Ex-Partner: No    Emotionally Abused: No    Physically Abused: No    Sexually Abused: No     Outpatient Medications Prior to Visit  Medication Sig Dispense Refill   Accu-Chek Softclix Lancets lancets Use as instructed 100 each 5   albuterol (VENTOLIN HFA) 108 (90 Base) MCG/ACT inhaler INHALE 2 PUFFS INTO THE LUNGS EVERY 4 HOURS AS NEEDED FOR WHEEZING OR SHORTNESS OF BREATH. 8.5 g 6   allopurinol (ZYLOPRIM) 100 MG tablet Take 1 tablet (100 mg total) by mouth daily. 30 tablet 6   amLODipine (NORVASC) 5 MG tablet Take 5 mg by mouth daily.     Blood Glucose Monitoring Suppl (ACCU-CHEK GUIDE) w/Device KIT 1 each by Does not apply route in the morning, at noon, and at bedtime. 1 kit  0   clopidogrel (PLAVIX) 75 MG tablet Take 1 tablet (75 mg total) by mouth daily. 30 tablet 6   colchicine-probenecid 0.5-500 MG tablet TAKE 1 TABLET BY MOUTH TWICE DAILY AS NEEDED 14 tablet 1   ENTRESTO 24-26 MG TAKE 1 TABLET BY MOUTH 2 TIMES DAILY.(OBLONG PINK TAB WITH LZ NVR) 186 tablet 1   ezetimibe (ZETIA) 10 MG tablet Take 10 mg by mouth daily.     famotidine (PEPCID) 20 MG tablet Take 20 mg by mouth 2 (two) times daily.     Glucagon, rDNA, (GLUCAGON EMERGENCY) 1 MG KIT Inject 1 mg into the muscle as needed (BG less than 70).     glucose blood (ACCU-CHEK GUIDE) test strip Use as instructed 100 each 5   GOODSENSE ASPIRIN LOW DOSE 81 MG tablet TAKE 1 TABLET ($RemoveB'81MG'HLIeJNUL$ ) BY MOUTH DAILY. SWALLOW WHOLE.(YELLOW ROUND TAB WITH A HEART) 93 tablet 2   isosorbide mononitrate (IMDUR) 60 MG 24 hr tablet Take 60 mg by mouth daily.     LANTUS SOLOSTAR 100 UNIT/ML Solostar Pen ADMINISTER 30 UNITS UNDER THE SKIN DAILY 15 mL 3   metFORMIN (GLUCOPHAGE) 500 MG tablet TAKE 1 TABLET BY MOUTH EVERY DAY WITH SUPPER.(ROUND WHITE TAB WITH H 102) 90 tablet 2   metoprolol succinate (TOPROL-XL) 25 MG 24 hr tablet TAKE 1 TABLET (25 MG TOTAL) BY MOUTH DAILY.(OVAL WHITE TAB WITH 564) 31 tablet 3   nitroGLYCERIN (NITROSTAT) 0.4 MG SL tablet DISSOLVE 1 TABLET UNDER TONGUE EVERY 5 MINUTES AS NEEDED CHEST PAIN. 25 tablet 1   pantoprazole  (PROTONIX) 40 MG tablet TAKE ONE TABLET BY MOUTH TWO TIMES DAILY. (OVAL YELLOW TABLET WITH H126) 62 tablet 0   polyethylene glycol (MIRALAX / GLYCOLAX) 17 g packet Take 17 g by mouth daily.     predniSONE (DELTASONE) 10 MG tablet Take 1 tablet (10 mg total) by mouth 2 (two) times daily with a meal. 14 tablet 0   ranolazine (RANEXA) 500 MG 12 hr tablet TAKE 1 TABLET BY MOUTH 2 TIMES DAILY (PEACH COLORED TABLET WITH C49 OR 588) 186 tablet 0   rosuvastatin (CRESTOR) 40 MG tablet Take 1 tablet (40 mg total) by mouth daily. 90 tablet 2   Semaglutide, 1 MG/DOSE, 4 MG/3ML SOPN Inject 1 mg as directed once a week. 3 mL 2   spironolactone (ALDACTONE) 25 MG tablet TAKE 1/2 TABLET(12.5) BY MOUTH DAILY. (HALF TAN TAB) 30 tablet 3   tamsulosin (FLOMAX) 0.4 MG CAPS capsule TAKE 1 CAPSULE (.$RemoveBef'4MG'XWrRrJztaS$  TOTAL) BY MOUTH DAILY.(TAN AND GOLD CAPSULE WITH CL23 0.4) 93 capsule 3   traZODone (DESYREL) 150 MG tablet TAKE 1 TABLET($RemoveBefor'150MG'IfhWwsGwcZhf$ ) BY MOUTH AT BEDTIME(OVAL WHITE TAB WITH 13 32 505050) 93 tablet 1   TRELEGY ELLIPTA 100-62.5-25 MCG/ACT AEPB Take 1 puff by mouth daily. 28 each 2   ULTICARE SHORT PEN NEEDLES 31G X 8 MM MISC USE AS DIRECTED (ONCE DAILY WITH LANTUS INJECTION AS INSTRUCTED) 100 each 4   oxyCODONE (OXY IR/ROXICODONE) 5 MG immediate release tablet Take 1 tablet (5 mg total) by mouth every 6 (six) hours. 20 tablet 0   No facility-administered medications prior to visit.    Allergies  Allergen Reactions   Motrin [Ibuprofen] Other (See Comments)    Reaction: ulcers   Other Nausea And Vomiting    Patient has ulcers    Review of Systems  Constitutional:  Negative for chills, fatigue, fever and unexpected weight change.  HENT:  Negative for congestion, ear pain, sinus pain and sore throat.  Respiratory:  Positive for shortness of breath. Negative for cough.   Cardiovascular:  Negative for chest pain and palpitations.  Gastrointestinal:  Negative for abdominal pain, blood in stool, constipation, diarrhea,  nausea and vomiting.  Endocrine: Negative for polydipsia.  Genitourinary:  Negative for dysuria.  Musculoskeletal:  Positive for arthralgias (Right hand pain) and myalgias. Negative for back pain.       Right hand swelling.  Skin:  Negative for rash.  Neurological:  Negative for headaches.       Objective:    Physical Exam Vitals reviewed.  Constitutional:      General: He is in acute distress.     Appearance: Normal appearance.  Cardiovascular:     Rate and Rhythm: Normal rate and regular rhythm.     Heart sounds: Normal heart sounds.  Pulmonary:     Effort: Pulmonary effort is normal.     Breath sounds: Normal breath sounds.  Skin:    Comments: Right hand very edematous, increased warmth.  Tender.  Erythema. Appears to stop at the wrist.  Neurological:     Mental Status: He is alert.     BP (!) 160/100   Pulse 96   Temp 98.7 F (37.1 C)   Resp 16   Ht $R'5\' 8"'LR$  (1.727 m)   Wt 293 lb (132.9 kg)   SpO2 98%   BMI 44.55 kg/m  Wt Readings from Last 3 Encounters:  01/01/22 293 lb (132.9 kg)  12/29/21 294 lb (133.4 kg)  11/17/21 (!) 302 lb (137 kg)    Health Maintenance Due  Topic Date Due   HIV Screening  Never done   Hepatitis C Screening  Never done   TETANUS/TDAP  Never done   Zoster Vaccines- Shingrix (2 of 2) 07/02/2019   COVID-19 Vaccine (4 - Booster for Pfizer series) 09/07/2020   OPHTHALMOLOGY EXAM  11/12/2021    There are no preventive care reminders to display for this patient.   Lab Results  Component Value Date   TSH 0.719 01/24/2021   Lab Results  Component Value Date   WBC 12.1 (H) 12/29/2021   HGB 14.4 12/29/2021   HCT 42.5 12/29/2021   MCV 89 12/29/2021   PLT 410 12/29/2021   Lab Results  Component Value Date   NA 139 12/29/2021   K 3.6 12/29/2021   CO2 22 12/29/2021   GLUCOSE 144 (H) 12/29/2021   BUN 8 12/29/2021   CREATININE 1.22 12/29/2021   BILITOT 0.6 12/29/2021   ALKPHOS 83 12/29/2021   AST 12 12/29/2021   ALT 10  12/29/2021   PROT 6.9 12/29/2021   ALBUMIN 4.1 12/29/2021   CALCIUM 10.0 12/29/2021   ANIONGAP 11 08/06/2017   EGFR 67 12/29/2021   Lab Results  Component Value Date   CHOL 193 12/29/2021   Lab Results  Component Value Date   HDL 40 12/29/2021   Lab Results  Component Value Date   LDLCALC 124 (H) 12/29/2021   Lab Results  Component Value Date   TRIG 163 (H) 12/29/2021   Lab Results  Component Value Date   CHOLHDL 4.8 12/29/2021   Lab Results  Component Value Date   HGBA1C 6.4 (H) 12/29/2021       Assessment & Plan:   Problem List Items Addressed This Visit       Endocrine   Diabetic polyneuropathy ([place])    Increased risk for severity of infection.  Watch closely.        Other   Cellulitis  of right hand - Primary    Rocephin 1 g given. Stop the prednisone. Start Keflex 500 mg 4 times a day for 10 days. [facility] to [facility] for an x-ray and stat labs. Results came back with WBC 15,000 and elevated platelets.  Mild left shift.  Sed rate and C-reactive protein were elevated.  Patient to call in the morning to update [place] on how he is doing.  If his hand is no better in 24 hours, I will have him go to the hospital for probable admission for severe cellulitis. X-ray showed edema of the tissue of right hand.  Oxycodone increased to 10 mg every 6 hours as needed for severe pain. Per patient he has approximately 6 oxycodone 5 mg still.      Relevant Medications   cephALEXin (KEFLEX) 500 MG capsule   Other Relevant Orders   DG Hand Complete Right   CBC with Differential/Platelet   Sedimentation rate   C-reactive protein   Meds ordered this encounter  Medications   cefTRIAXone (ROCEPHIN) injection 1 g   cephALEXin (KEFLEX) 500 MG capsule    Sig: Take 1 capsule (500 mg total) by mouth 4 (four) times daily.    Dispense:  40 capsule    Refill:  0   Oxycodone HCl 10 MG TABS    Sig: One pill every 6 hours prn severe pain in hand    Dispense:  20 tablet     Refill:  0    Orders Placed This Encounter  Procedures   DG Hand Complete Right   CBC with Differential/Platelet   Sedimentation rate   C-reactive protein    Total time spent on today's visit was greater than 30 minutes, including both face-to-face time and nonface-to-face time personally spent on review of chart (labs and imaging), discussing labs and goals, discussing further work-up, treatment options, referrals to specialist if needed, reviewing outside records of pertinent, answering patient's questions, and coordinating care.  Follow-up: Return in about 1 day (around 01/02/2022) for By phone..  An After Visit Summary was printed and given to the patient.  [name], MD [facility] [phone]

## 2022-01-02 ENCOUNTER — Telehealth: Payer: Self-pay

## 2022-01-02 ENCOUNTER — Other Ambulatory Visit: Payer: Self-pay

## 2022-01-02 DIAGNOSIS — L03113 Cellulitis of right upper limb: Secondary | ICD-10-CM

## 2022-01-02 LAB — CBC WITH DIFFERENTIAL/PLATELET
HCT: 42 — AB (ref 29–41)
Hemoglobin: 13.7
Platelet: 505
Red Blood Cell Count: 4.47
WBC: 15.5

## 2022-01-02 LAB — SEDIMENTATION RATE: Sed Rate: 50

## 2022-01-02 LAB — C-REACTIVE PROTEIN: CRP: 32.8

## 2022-01-02 NOTE — Telephone Encounter (Signed)
Spoke with Leno this morning. His hand is much better, swelling decreased and feels "wonderful." He was very thankful.   Harrell Lark 01/02/22 12:48 PM

## 2022-01-03 DIAGNOSIS — M10031 Idiopathic gout, right wrist: Secondary | ICD-10-CM | POA: Diagnosis not present

## 2022-01-03 DIAGNOSIS — M19031 Primary osteoarthritis, right wrist: Secondary | ICD-10-CM | POA: Diagnosis not present

## 2022-01-03 DIAGNOSIS — J9610 Chronic respiratory failure, unspecified whether with hypoxia or hypercapnia: Secondary | ICD-10-CM | POA: Diagnosis not present

## 2022-01-03 DIAGNOSIS — L03113 Cellulitis of right upper limb: Secondary | ICD-10-CM | POA: Diagnosis not present

## 2022-01-03 DIAGNOSIS — M1811 Unilateral primary osteoarthritis of first carpometacarpal joint, right hand: Secondary | ICD-10-CM | POA: Diagnosis not present

## 2022-01-03 DIAGNOSIS — I110 Hypertensive heart disease with heart failure: Secondary | ICD-10-CM | POA: Diagnosis not present

## 2022-01-03 DIAGNOSIS — M7989 Other specified soft tissue disorders: Secondary | ICD-10-CM | POA: Diagnosis not present

## 2022-01-03 DIAGNOSIS — M65841 Other synovitis and tenosynovitis, right hand: Secondary | ICD-10-CM | POA: Diagnosis not present

## 2022-01-04 DIAGNOSIS — Z886 Allergy status to analgesic agent status: Secondary | ICD-10-CM | POA: Diagnosis not present

## 2022-01-04 DIAGNOSIS — Z792 Long term (current) use of antibiotics: Secondary | ICD-10-CM | POA: Diagnosis not present

## 2022-01-04 DIAGNOSIS — I2510 Atherosclerotic heart disease of native coronary artery without angina pectoris: Secondary | ICD-10-CM | POA: Diagnosis not present

## 2022-01-04 DIAGNOSIS — L03113 Cellulitis of right upper limb: Secondary | ICD-10-CM | POA: Diagnosis not present

## 2022-01-04 DIAGNOSIS — M109 Gout, unspecified: Secondary | ICD-10-CM | POA: Diagnosis not present

## 2022-01-04 DIAGNOSIS — K219 Gastro-esophageal reflux disease without esophagitis: Secondary | ICD-10-CM | POA: Diagnosis not present

## 2022-01-04 DIAGNOSIS — Z794 Long term (current) use of insulin: Secondary | ICD-10-CM | POA: Diagnosis not present

## 2022-01-04 DIAGNOSIS — Z79899 Other long term (current) drug therapy: Secondary | ICD-10-CM | POA: Diagnosis not present

## 2022-01-04 DIAGNOSIS — I509 Heart failure, unspecified: Secondary | ICD-10-CM | POA: Diagnosis not present

## 2022-01-04 DIAGNOSIS — J449 Chronic obstructive pulmonary disease, unspecified: Secondary | ICD-10-CM | POA: Diagnosis not present

## 2022-01-04 DIAGNOSIS — M7989 Other specified soft tissue disorders: Secondary | ICD-10-CM | POA: Diagnosis not present

## 2022-01-04 DIAGNOSIS — M10031 Idiopathic gout, right wrist: Secondary | ICD-10-CM | POA: Diagnosis not present

## 2022-01-04 DIAGNOSIS — M19031 Primary osteoarthritis, right wrist: Secondary | ICD-10-CM | POA: Diagnosis not present

## 2022-01-04 DIAGNOSIS — E7800 Pure hypercholesterolemia, unspecified: Secondary | ICD-10-CM | POA: Diagnosis not present

## 2022-01-04 DIAGNOSIS — I252 Old myocardial infarction: Secondary | ICD-10-CM | POA: Diagnosis not present

## 2022-01-04 DIAGNOSIS — F17210 Nicotine dependence, cigarettes, uncomplicated: Secondary | ICD-10-CM | POA: Diagnosis not present

## 2022-01-04 DIAGNOSIS — Z9981 Dependence on supplemental oxygen: Secondary | ICD-10-CM | POA: Diagnosis not present

## 2022-01-04 DIAGNOSIS — G4733 Obstructive sleep apnea (adult) (pediatric): Secondary | ICD-10-CM | POA: Diagnosis not present

## 2022-01-04 DIAGNOSIS — M1811 Unilateral primary osteoarthritis of first carpometacarpal joint, right hand: Secondary | ICD-10-CM | POA: Diagnosis not present

## 2022-01-04 DIAGNOSIS — J9610 Chronic respiratory failure, unspecified whether with hypoxia or hypercapnia: Secondary | ICD-10-CM | POA: Diagnosis not present

## 2022-01-04 DIAGNOSIS — M65841 Other synovitis and tenosynovitis, right hand: Secondary | ICD-10-CM | POA: Diagnosis not present

## 2022-01-04 DIAGNOSIS — Z7982 Long term (current) use of aspirin: Secondary | ICD-10-CM | POA: Diagnosis not present

## 2022-01-04 DIAGNOSIS — E119 Type 2 diabetes mellitus without complications: Secondary | ICD-10-CM | POA: Diagnosis not present

## 2022-01-04 DIAGNOSIS — Z8673 Personal history of transient ischemic attack (TIA), and cerebral infarction without residual deficits: Secondary | ICD-10-CM | POA: Diagnosis not present

## 2022-01-04 DIAGNOSIS — J439 Emphysema, unspecified: Secondary | ICD-10-CM | POA: Diagnosis not present

## 2022-01-04 DIAGNOSIS — Z7902 Long term (current) use of antithrombotics/antiplatelets: Secondary | ICD-10-CM | POA: Diagnosis not present

## 2022-01-04 DIAGNOSIS — M1990 Unspecified osteoarthritis, unspecified site: Secondary | ICD-10-CM | POA: Diagnosis not present

## 2022-01-04 DIAGNOSIS — F32A Depression, unspecified: Secondary | ICD-10-CM | POA: Diagnosis not present

## 2022-01-04 DIAGNOSIS — I110 Hypertensive heart disease with heart failure: Secondary | ICD-10-CM | POA: Diagnosis not present

## 2022-01-07 ENCOUNTER — Telehealth: Payer: Self-pay

## 2022-01-07 ENCOUNTER — Other Ambulatory Visit: Payer: Self-pay | Admitting: Family Medicine

## 2022-01-07 MED ORDER — OXYCODONE HCL 10 MG PO TABS: ORAL_TABLET | ORAL | 0 refills | Status: DC

## 2022-01-07 NOTE — Telephone Encounter (Signed)
  Transition Care Management Follow-up Telephone Call    Brent Ortiz 11-20-1960  Admit Date: 01/04/2022 Discharge Date: 01/05/2022 Discharged from where: [facility]  Diagnoses: Cellulitis, CAD, COPD.  2 day post discharge: 01/07/2022 7 day post discharge: 01/12/2022 14 day post discharge: 01/19/2022  Brent Ortiz was discharged from [facility] on 01/05/2022 with the diagnoses listed above. He was contacted today via telephone in regards to transition of care.     Discharge Instructions: Sent to home, stable. Appointment with PCP in one week.  New Medication: Bactrim 160-800 1 tablet BID, Tylenol 650 mg 1 tablet every 6 hours PRN. Discontinued prednisone. Folllow up with orthopedic surgery in one week.  Items Reviewed: Did the pt receive and understand the discharge instructions provided? Yes  Medications obtained and verified? Yes  Other?  N/A Any new allergies since your discharge? No  Dietary orders reviewed? Yes Do you have support at home? Yes   Home Care and Equipment/Supplies: Were home health services ordered? no If so, what is the name of the agency? N/A  Has the agency set up a time to come to the patient's home? not applicable Were any new equipment or medical supplies ordered?  No What is the name of the medical supply agency? N/A Were you able to get the supplies/equipment? not applicable  Functional Questionnaire: (I = Independent and D = Dependent) ADLs: I  Bathing/Dressing- I  Meal Prep- D  Eating- I  Maintaining continence- I  Transferring/Ambulation- I  Managing Meds- I  Any patient concerns? no  Follow up appointments reviewed: PCP Hospital f/u appt confirmed? Yes  Scheduled to see Dr Tobie Poet on 01/09/2022 @ 8:40 am. [facility] f/u appt confirmed? Yes  Scheduled to see Orthopedic on 01/20/2022. Are transportation arrangements needed? Yes  If their condition worsens, is the pt aware to call PCP or go to the  Emergency Dept.? Yes Was the patient provided with contact information for the PCP's office/after hours number? Yes Was to pt encouraged to call back with questions or concerns? Yes    01/07/22 4:05 PM

## 2022-01-07 NOTE — Telephone Encounter (Signed)
Patient called and stated his right hand has swollen back up, He has an appointment with the Orthopedic on 7/18, but was wondering if he should make an appointment to be seen here or should he go to ER/urgent care to be seen.

## 2022-01-07 NOTE — Telephone Encounter (Signed)
Patient stated [facility] is unable [facility] get [facility] till maybe tomorrow of Friday and wanted to know could you send him in something for pain.

## 2022-01-07 NOTE — Telephone Encounter (Signed)
I set up an appointment on 01/09/2022 at 8:40 am

## 2022-01-07 NOTE — Telephone Encounter (Signed)
Scheduled Friday with me. Patient requesting pain medicine.   I will send him new rx.

## 2022-01-08 ENCOUNTER — Ambulatory Visit: Payer: Medicare Other | Admitting: Podiatry

## 2022-01-08 ENCOUNTER — Other Ambulatory Visit: Payer: Self-pay

## 2022-01-09 ENCOUNTER — Encounter: Payer: Self-pay | Admitting: Family Medicine

## 2022-01-09 ENCOUNTER — Ambulatory Visit (INDEPENDENT_AMBULATORY_CARE_PROVIDER_SITE_OTHER): Payer: Medicare Other | Admitting: Family Medicine

## 2022-01-09 VITALS — BP 148/90 | HR 88 | Temp 97.8°F | Ht 68.0 in | Wt 299.0 lb

## 2022-01-09 DIAGNOSIS — J432 Centrilobular emphysema: Secondary | ICD-10-CM

## 2022-01-09 DIAGNOSIS — I2510 Atherosclerotic heart disease of native coronary artery without angina pectoris: Secondary | ICD-10-CM

## 2022-01-09 DIAGNOSIS — L03113 Cellulitis of right upper limb: Secondary | ICD-10-CM | POA: Diagnosis not present

## 2022-01-09 MED ORDER — OXYCODONE-ACETAMINOPHEN 5-325 MG PO TABS: 1.0000 | ORAL_TABLET | Freq: Four times a day (QID) | ORAL | 0 refills | Status: DC | PRN

## 2022-01-09 NOTE — Progress Notes (Unsigned)
Subjective:  Patient ID: Brent Ortiz, male    DOB: 09/16/1960  Age: 61 y.o. MRN: [id number]  Chief Complaint  Patient presents with   Hospital follow up    Follow up Hospitalization  Patient was admitted to [facility] on 01/04/22 and discharged on 01/05/22. He presented with diffuse swelling and erythema over the right hand wrist and up to past the mid forearm consistent with a cellulitis. He was treated for Cellulitis of right hand, CAD, COPD. Treatment for this included IV antibiotics, Vancomycin and within 48 hours the swelling and cellulitis had over 70% improvement. CT of the upper right extremity with contrast showed:  -Erosive  changes involving the distal ulna and triquetrum. Mass-like synovitis of the distal radioulnar joint and ulnar aspect of the radiocarpal joint. Findings may be secondary to an inflamatory or crystaline arthropathy versus septic arthritis - Extensor carpi ulnaris tendon appears to be torn at the level of ulnas styloid. -Severe degenerative changes of the first carpometacarpal joint.  He was discharged with Bactrim DS 800-160 mg BID and Acetaminophen 650 mg Q6h PRN. They discontinued Prednisone 10 mg Bid.  He reports good compliance with treatment. He reports this condition is improved.   Current Outpatient Medications on File Prior to Visit  Medication Sig Dispense Refill   Accu-Chek Softclix Lancets lancets Use as instructed 100 each 5   albuterol (VENTOLIN HFA) 108 (90 Base) MCG/ACT inhaler INHALE 2 PUFFS INTO THE LUNGS EVERY 4 HOURS AS NEEDED FOR WHEEZING OR SHORTNESS OF BREATH. 8.5 g 6   allopurinol (ZYLOPRIM) 100 MG tablet Take 1 tablet (100 mg total) by mouth daily. 30 tablet 6   amLODipine (NORVASC) 5 MG tablet Take 5 mg by mouth daily.     Blood Glucose Monitoring Suppl (ACCU-CHEK GUIDE) w/Device KIT 1 each by Does not apply route in the morning, at noon, and at bedtime. 1 kit 0   clopidogrel (PLAVIX) 75 MG tablet Take 1 tablet (75 mg  total) by mouth daily. 30 tablet 6   colchicine-probenecid 0.5-500 MG tablet TAKE 1 TABLET BY MOUTH TWICE DAILY AS NEEDED 14 tablet 1   ezetimibe (ZETIA) 10 MG tablet Take 10 mg by mouth daily.     famotidine (PEPCID) 20 MG tablet Take 20 mg by mouth 2 (two) times daily.     Glucagon, rDNA, (GLUCAGON EMERGENCY) 1 MG KIT Inject 1 mg into the muscle as needed (BG less than 70).     glucose blood (ACCU-CHEK GUIDE) test strip Use as instructed 100 each 5   isosorbide mononitrate (IMDUR) 60 MG 24 hr tablet Take 60 mg by mouth daily.     LANTUS SOLOSTAR 100 UNIT/ML Solostar Pen ADMINISTER 30 UNITS UNDER THE SKIN DAILY 15 mL 3   metFORMIN (GLUCOPHAGE) 500 MG tablet TAKE 1 TABLET BY MOUTH EVERY DAY WITH SUPPER.(ROUND WHITE TAB WITH H 102) 90 tablet 2   metoprolol succinate (TOPROL-XL) 25 MG 24 hr tablet TAKE 1 TABLET (25 MG TOTAL) BY MOUTH DAILY.(OVAL WHITE TAB WITH 564) 31 tablet 3   nitroGLYCERIN (NITROSTAT) 0.4 MG SL tablet DISSOLVE 1 TABLET UNDER TONGUE EVERY 5 MINUTES AS NEEDED CHEST PAIN. 25 tablet 1   polyethylene glycol (MIRALAX / GLYCOLAX) 17 g packet Take 17 g by mouth daily.     rosuvastatin (CRESTOR) 40 MG tablet Take 1 tablet (40 mg total) by mouth daily. 90 tablet 2   Semaglutide, 1 MG/DOSE, 4 MG/3ML SOPN Inject 1 mg as directed once a week. 3 mL 2  spironolactone (ALDACTONE) 25 MG tablet TAKE 1/2 TABLET(12.5) BY MOUTH DAILY. (HALF TAN TAB) 30 tablet 3   sulfamethoxazole-trimethoprim (BACTRIM DS) 800-160 MG tablet Take 1 tablet by mouth 2 (two) times daily.     tamsulosin (FLOMAX) 0.4 MG CAPS capsule TAKE 1 CAPSULE (.$RemoveBef'4MG'dGsDFHqddn$  TOTAL) BY MOUTH DAILY.(TAN AND GOLD CAPSULE WITH CL23 0.4) 93 capsule 3   traZODone (DESYREL) 150 MG tablet TAKE 1 TABLET($RemoveBefor'150MG'ZjSgqLKfNcgh$ ) BY MOUTH AT BEDTIME(OVAL WHITE TAB WITH 13 32 505050) 93 tablet 1   TRELEGY ELLIPTA 100-62.5-25 MCG/ACT AEPB Take 1 puff by mouth daily. 28 each 2   ULTICARE SHORT PEN NEEDLES 31G X 8 MM MISC USE AS DIRECTED (ONCE DAILY WITH LANTUS INJECTION AS  INSTRUCTED) 100 each 4   No current facility-administered medications on file prior to visit.   Past Medical History:  Diagnosis Date   Acute combined systolic and diastolic CHF, NYHA class 2 ([place]) 01/22/2020   Alcohol abuse    Atherosclerosis of coronary artery of native heart with stable angina pectoris, unspecified vessel or lesion type ([place]) 01/23/2016   Overview:  Completely occluded circumflex artery proximal to obtuse marginal 1 basin cardiac catheter from 2017   Chronic kidney disease    Chronic pain 10/19/2019   Sees pain clinic   COPD (chronic obstructive pulmonary disease) ([place])    Coronary artery disease involving native heart 02/08/2020   Coronary atherosclerosis of native coronary artery 01/23/2016   Overview:  Completely occluded circumflex artery proximal to obtuse marginal 1 basin cardiac catheter from 2017   Depression, major, single episode, moderate ([place]) 07/11/2020   Diabetic polyneuropathy ([place]) 12/06/2019   ED (erectile dysfunction) 12/06/2019   Essential hypertension 01/23/2016   Essential thrombocythemia ([place]) 09/29/2011   GERD without esophagitis    HFrEF (heart failure with reduced ejection fraction) ([place]) 02/08/2020   Knee pain, left 02/12/2012   Formatting of this note might be different from the original. Left   Leukocytosis 09/04/2011   Mixed hyperlipidemia 06/18/2017   Morbid obesity ([place]) 11/21/2020   Morbid obesity with BMI of 40.0-44.9, adult ([place]) 01/13/2013   Myocardial infarction ([place])    04-13-2011   Obstructive chronic bronchitis with exacerbation ([place]) 10/03/2019   Opiate abuse, continuous ([facility])    Osteoarthritis    Pain in knee joint 02/12/2012   Formatting of this note might be different from the original. Left   Pneumonia    Pneumonia due to infectious organism 09/04/2011   Polysubstance abuse ([place])    Poor dentition    Prostatitis 12/04/2020   Thrombocytosis 09/29/2011   Tobacco dependence 01/23/2016   Type 2 diabetes mellitus  with diabetic polyneuropathy [facility])    Past Surgical History:  Procedure Laterality Date   HERNIA REPAIR  2009   I & D KNEE WITH POLY EXCHANGE  09/03/2011   Procedure: IRRIGATION AND DEBRIDEMENT KNEE WITH POLY EXCHANGE;  Surgeon: Mauri Pole, MD;  Location: [facility];  Service: Orthopedics;  Laterality: Left;   TOOTH EXTRACTION Bilateral 08/06/2017   Procedure: DENTAL RESTORATION/EXTRACTIONS;  Surgeon: Diona Browner, DDS;  Location: [place];  Service: Oral Surgery;  Laterality: Bilateral;   TOTAL KNEE ARTHROPLASTY   right  feb 2012   left march 2012 r   TOTAL KNEE REVISION  06/23/2011   Procedure: TOTAL KNEE REVISION;  Surgeon: Mauri Pole;  Location: [facility];  Service: Orthopedics;  Laterality: Left;  femomal nerve block done in holding area without incident    Family History  Problem Relation Age of Onset   Colon  cancer Other    Colon cancer Mother    Social History   Socioeconomic History   Marital status: Widowed    Spouse name: Not on file   Number of children: 5   Years of education: Not on file   Highest education level: Not on file  Occupational History   Occupation: Disabled  Tobacco Use   Smoking status: Every Day    Packs/day: 0.50    Years: 50.00    Total pack years: 25.00    Types: Cigarettes   Smokeless tobacco: Never  Vaping Use   Vaping Use: Former  Substance and Sexual Activity   Alcohol use: Yes    Comment: heavy drinker in past. occasionally has a beer. Not on regular basis.   Drug use: Not Currently    Types: Cocaine   Sexual activity: Not Currently  Other Topics Concern   Not on file  Social History Narrative   Not on file   Social Determinants of Health   Financial Resource Strain: Low Risk  (06/03/2020)   Overall Financial Resource Strain (CARDIA)    Difficulty of Paying Living Expenses: Not very hard  Food Insecurity: No Food Insecurity (06/03/2020)   Hunger Vital Sign    Worried About Running Out of Food in the Last Year: Never true    Ran  Out of Food in the Last Year: Never true  Transportation Needs: No Transportation Needs (06/03/2020)   PRAPARE - Hydrologist (Medical): No    Lack of Transportation (Non-Medical): No  Physical Activity: Inactive (06/03/2020)   Exercise Vital Sign    Days of Exercise per Week: 0 days    Minutes of Exercise per Session: 0 min  Stress: No Stress Concern Present (06/03/2020)   [place]    Feeling of Stress : Not at all  Social Connections: Moderately Integrated (06/03/2020)   Social Connection and Isolation Panel [NHANES]    Frequency of Communication with Friends and Family: Twice a week    Frequency of Social Gatherings with Friends and Family: Once a week    Attends Religious Services: More than 4 times per year    Active Member of [employer] or Organizations: Yes    Attends Archivist Meetings: More than 4 times per year    Marital Status: Widowed    Review of Systems  Constitutional:  Negative for appetite change, fatigue and fever.  HENT:  Negative for congestion, ear pain, sinus pressure and sore throat.   Respiratory:  Negative for cough, chest tightness, shortness of breath and wheezing.   Cardiovascular:  Negative for chest pain and palpitations.  Gastrointestinal:  Negative for abdominal pain, constipation, diarrhea, nausea and vomiting.  Genitourinary:  Negative for dysuria and hematuria.  Musculoskeletal:  Positive for joint swelling (Right hand better, swelling and pain has went down.). Negative for arthralgias, back pain and myalgias.  Skin:  Negative for rash.  Neurological:  Negative for dizziness, weakness and headaches.  Psychiatric/Behavioral:  Negative for dysphoric mood. The patient is not nervous/anxious.      Objective:  BP (!) 148/90 (BP Location: Left Arm, Patient Position: Sitting)   Pulse 88   Temp 97.8 F (36.6 C) (Temporal)   Ht $R'5\' 8"'ez$  (1.727 m)   Wt  299 lb (135.6 kg)   SpO2 96%   BMI 45.46 kg/m      01/09/2022    8:32 AM 01/01/2022   10:47 AM 12/29/2021  10:22 AM  BP/Weight  Systolic BP 209 470 962  Diastolic BP 90 836 629  Wt. (Lbs) 299 293   BMI 45.46 kg/m2 44.55 kg/m2     Physical Exam Vitals reviewed.  Constitutional:      Appearance: Normal appearance. He is obese.  Cardiovascular:     Rate and Rhythm: Normal rate and regular rhythm.     Heart sounds: Normal heart sounds.  Pulmonary:     Effort: Pulmonary effort is normal.     Breath sounds: Normal breath sounds.  Musculoskeletal:        General: Swelling (rt hand and forearm. improved. erythema improved. tender. In a brace.) present.  Neurological:     Mental Status: He is alert and oriented to person, place, and time.  Psychiatric:        Mood and Affect: Mood normal.        Behavior: Behavior normal.     Diabetic Foot Exam - Simple   No data filed      Lab Results  Component Value Date   WBC 12.9 (H) 01/09/2022   HGB 13.8 01/09/2022   HCT 41.3 01/09/2022   PLT 489 (H) 01/09/2022   GLUCOSE 142 (H) 01/09/2022   CHOL 193 12/29/2021   TRIG 163 (H) 12/29/2021   HDL 40 12/29/2021   LDLCALC 124 (H) 12/29/2021   ALT 10 01/09/2022   AST 5 01/09/2022   NA 138 01/09/2022   K 4.6 01/09/2022   CL 101 01/09/2022   CREATININE 1.08 01/09/2022   BUN 13 01/09/2022   CO2 21 01/09/2022   TSH 0.719 01/24/2021   INR 1.81 (H) 09/07/2011   HGBA1C 6.4 (H) 12/29/2021   MICROALBUR 80 09/27/2020      Assessment & Plan:   Problem List Items Addressed This Visit   None Visit Diagnoses     Cellulitis of right upper extremity    -  Primary   Relevant Orders   CBC with Differential/Platelet (Completed)   Comprehensive metabolic panel (Completed)   C-reactive protein (Completed)   Sedimentation rate (Completed)   Atherosclerosis of native coronary artery of native heart without angina pectoris       Centrilobular emphysema ([place])         .  Meds ordered  this encounter  Medications   DISCONTD: oxyCODONE-acetaminophen (PERCOCET/ROXICET) 5-325 MG tablet    Sig: Take 1 tablet by mouth every 6 (six) hours as needed for severe pain.    Dispense:  120 tablet    Refill:  0    Orders Placed This Encounter  Procedures   CBC with Differential/Platelet   Comprehensive metabolic panel   C-reactive protein   Sedimentation rate     Follow-up: Return in about 1 month (around 02/09/2022) for chronic pain management.  An After Visit Summary was printed and given to the patient.   I,Lauren M Auman,acting as a scribe for [name], MD.,have documented all relevant documentation on the behalf of [name], MD,as directed by  [name], MD while in the presence of [name], MD.   Geralynn Ochs I Leal-Borjas,acting as a scribe for [name], MD.,have documented all relevant documentation on the behalf of [name], MD,as directed by  [name], MD while in the presence of [name], MD.   [name], MD [place] [phone]

## 2022-01-10 LAB — COMPREHENSIVE METABOLIC PANEL
ALT: 10 IU/L (ref 0–44)
AST: 5 IU/L (ref 0–40)
Albumin/Globulin Ratio: 1.6 (ref 1.2–2.2)
Albumin: 3.9 g/dL (ref 3.8–4.8)
Alkaline Phosphatase: 68 IU/L (ref 44–121)
BUN/Creatinine Ratio: 12 (ref 10–24)
BUN: 13 mg/dL (ref 8–27)
Bilirubin Total: 0.3 mg/dL (ref 0.0–1.2)
CO2: 21 mmol/L (ref 20–29)
Calcium: 9.6 mg/dL (ref 8.6–10.2)
Chloride: 101 mmol/L (ref 96–106)
Creatinine, Ser: 1.08 mg/dL (ref 0.76–1.27)
Globulin, Total: 2.4 g/dL (ref 1.5–4.5)
Glucose: 142 mg/dL — ABNORMAL HIGH (ref 70–99)
Potassium: 4.6 mmol/L (ref 3.5–5.2)
Sodium: 138 mmol/L (ref 134–144)
Total Protein: 6.3 g/dL (ref 6.0–8.5)
eGFR: 77 mL/min/{1.73_m2} (ref >59)

## 2022-01-10 LAB — CBC WITH DIFFERENTIAL/PLATELET
Basophils Absolute: 0.1 10*3/uL (ref 0.0–0.2)
Basos: 0 %
EOS (ABSOLUTE): 0.2 10*3/uL (ref 0.0–0.4)
Eos: 1 %
Hematocrit: 41.3 % (ref 37.5–51.0)
Hemoglobin: 13.8 g/dL (ref 13.0–17.7)
Immature Grans (Abs): 0.1 10*3/uL (ref 0.0–0.1)
Immature Granulocytes: 1 %
Lymphocytes Absolute: 2.9 10*3/uL (ref 0.7–3.1)
Lymphs: 23 %
MCH: 30.3 pg (ref 26.6–33.0)
MCHC: 33.4 g/dL (ref 31.5–35.7)
MCV: 91 fL (ref 79–97)
Monocytes Absolute: 0.6 10*3/uL (ref 0.1–0.9)
Monocytes: 5 %
Neutrophils Absolute: 9 10*3/uL — ABNORMAL HIGH (ref 1.4–7.0)
Neutrophils: 70 %
Platelets: 489 10*3/uL — ABNORMAL HIGH (ref 150–450)
RBC: 4.55 x10E6/uL (ref 4.14–5.80)
RDW: 13.6 % (ref 11.6–15.4)
WBC: 12.9 10*3/uL — ABNORMAL HIGH (ref 3.4–10.8)

## 2022-01-10 LAB — SEDIMENTATION RATE: Sed Rate: 61 mm/hr — ABNORMAL HIGH (ref 0–30)

## 2022-01-10 LAB — C-REACTIVE PROTEIN: CRP: 16 mg/L — ABNORMAL HIGH (ref 0–10)

## 2022-01-12 ENCOUNTER — Other Ambulatory Visit: Payer: Self-pay | Admitting: Family Medicine

## 2022-01-12 MED ORDER — OXYCODONE-ACETAMINOPHEN 5-325 MG PO TABS: 1.0000 | ORAL_TABLET | Freq: Four times a day (QID) | ORAL | 0 refills | Status: DC | PRN

## 2022-01-13 ENCOUNTER — Other Ambulatory Visit: Payer: Self-pay

## 2022-01-13 ENCOUNTER — Other Ambulatory Visit: Payer: Self-pay | Admitting: Family Medicine

## 2022-01-13 DIAGNOSIS — K2100 Gastro-esophageal reflux disease with esophagitis, without bleeding: Secondary | ICD-10-CM

## 2022-01-13 DIAGNOSIS — I2510 Atherosclerotic heart disease of native coronary artery without angina pectoris: Secondary | ICD-10-CM

## 2022-01-13 MED ORDER — PANTOPRAZOLE SODIUM 40 MG PO TBEC: DELAYED_RELEASE_TABLET | ORAL | 3 refills | Status: DC

## 2022-01-13 MED ORDER — ASPIRIN 81 MG PO TBEC: DELAYED_RELEASE_TABLET | ORAL | 0 refills | Status: DC

## 2022-01-13 MED ORDER — RANOLAZINE ER 500 MG PO TB12: ORAL_TABLET | ORAL | 0 refills | Status: DC

## 2022-01-13 MED ORDER — ENTRESTO 24-26 MG PO TABS: ORAL_TABLET | ORAL | 0 refills | Status: DC

## 2022-01-16 DIAGNOSIS — J432 Centrilobular emphysema: Secondary | ICD-10-CM

## 2022-01-16 HISTORY — DX: Centrilobular emphysema: J43.2

## 2022-01-16 NOTE — Assessment & Plan Note (Addendum)
Check labs. Complete bactrim ds  Keep appt with orthopedics.

## 2022-01-16 NOTE — Assessment & Plan Note (Signed)
Management per specialist. 

## 2022-01-18 DIAGNOSIS — G4733 Obstructive sleep apnea (adult) (pediatric): Secondary | ICD-10-CM | POA: Diagnosis not present

## 2022-01-18 NOTE — Assessment & Plan Note (Signed)
The current medical regimen is effective;  continue present plan and medications.  

## 2022-01-20 DIAGNOSIS — M19031 Primary osteoarthritis, right wrist: Secondary | ICD-10-CM | POA: Diagnosis not present

## 2022-01-27 ENCOUNTER — Telehealth: Payer: Self-pay | Admitting: Cardiology

## 2022-01-27 MED ORDER — SPIRONOLACTONE 25 MG PO TABS: ORAL_TABLET | ORAL | 0 refills | Status: DC

## 2022-01-27 NOTE — Telephone Encounter (Signed)
*  STAT* If patient is at the pharmacy, call can be transferred to refill team.   1. Which medications need to be refilled? (please list name of each medication and dose if known)  spironolactone (ALDACTONE) 25 MG tablet   2. Which pharmacy/location (including street and city if local pharmacy) is medication to be sent to?  [place], [place]  3. Do they need a 30 day or 90 day supply?   90 day  Caller states the patient is completely out of this medication.

## 2022-02-05 DIAGNOSIS — J449 Chronic obstructive pulmonary disease, unspecified: Secondary | ICD-10-CM | POA: Diagnosis not present

## 2022-02-09 ENCOUNTER — Other Ambulatory Visit: Payer: Self-pay | Admitting: Family Medicine

## 2022-02-09 ENCOUNTER — Encounter: Payer: Self-pay | Admitting: Family Medicine

## 2022-02-09 ENCOUNTER — Ambulatory Visit (INDEPENDENT_AMBULATORY_CARE_PROVIDER_SITE_OTHER): Payer: Medicare Other | Admitting: Family Medicine

## 2022-02-09 ENCOUNTER — Other Ambulatory Visit: Payer: Self-pay | Admitting: Legal Medicine

## 2022-02-09 VITALS — BP 120/80 | HR 130 | Temp 99.7°F | Resp 18 | Ht 68.0 in | Wt 295.0 lb

## 2022-02-09 DIAGNOSIS — G894 Chronic pain syndrome: Secondary | ICD-10-CM

## 2022-02-09 DIAGNOSIS — L03113 Cellulitis of right upper limb: Secondary | ICD-10-CM

## 2022-02-09 DIAGNOSIS — M10222 Drug-induced gout, left elbow: Secondary | ICD-10-CM

## 2022-02-09 DIAGNOSIS — M7022 Olecranon bursitis, left elbow: Secondary | ICD-10-CM | POA: Diagnosis not present

## 2022-02-09 DIAGNOSIS — F320 Major depressive disorder, single episode, mild: Secondary | ICD-10-CM

## 2022-02-09 DIAGNOSIS — I25118 Atherosclerotic heart disease of native coronary artery with other forms of angina pectoris: Secondary | ICD-10-CM

## 2022-02-09 DIAGNOSIS — M10022 Idiopathic gout, left elbow: Secondary | ICD-10-CM

## 2022-02-09 DIAGNOSIS — M1A09X0 Idiopathic chronic gout, multiple sites, without tophus (tophi): Secondary | ICD-10-CM

## 2022-02-09 DIAGNOSIS — N3946 Mixed incontinence: Secondary | ICD-10-CM

## 2022-02-09 MED ORDER — COLCHICINE 0.6 MG PO TABS: ORAL_TABLET | ORAL | 0 refills | Status: DC

## 2022-02-09 NOTE — Patient Instructions (Addendum)
Start on colchicine 0.6 mg twice daily x 1 week, then decrease to once daily.  If you are taking allopurinol 100 mg daily, continue it.  If you are not, restart allopurinol 100 mg once daily in 2 weeks.

## 2022-02-09 NOTE — Progress Notes (Addendum)
Subjective:  Patient ID: Brent Ortiz, male    DOB: Aug 05, 1960  Age: 61 y.o. MRN: [id number]  Chief Complaint  Patient presents with   Elbow Injury   Pain Management    HPI: Patient is in today for Right hand pain follow up. Patient is also complaining of left elbow swelling since 2 days ago. It happened suddenly when he was in the bathroom and hit the sink with his elbow.  He came to the office on 01/09/2022 and He mentioned to see Orthopedist for his right hand and they told him that he might need surgery to clean the bone. He finished colchicine's-probenecid and allopurinol. Some oxycodone 5 mg 4 times daily as needed severe pain #20.       02/10/2022    2:21 PM 12/29/2021    9:18 AM 11/17/2021    7:35 AM  PHQ9 SCORE ONLY  PHQ-9 Total Score $RemoveBef'10 11 10   'oQctjEZPHs$ Patient is depressed.  Patient has chronic issues with urge incontinence.   Current Outpatient Medications on File Prior to Visit  Medication Sig Dispense Refill   Accu-Chek Softclix Lancets lancets Use as instructed 100 each 5   albuterol (VENTOLIN HFA) 108 (90 Base) MCG/ACT inhaler INHALE 2 PUFFS INTO THE LUNGS EVERY 4 HOURS AS NEEDED FOR WHEEZING OR SHORTNESS OF BREATH. 8.5 g 6   amLODipine (NORVASC) 5 MG tablet Take 5 mg by mouth daily.     aspirin EC (GOODSENSE ASPIRIN LOW DOSE) 81 MG tablet TAKE 1 TABLET ($RemoveB'81MG'WWkRTOAJ$ ) BY MOUTH DAILY. SWALLOW WHOLE.(YELLOW ROUND TAB WITH A HEART) 93 tablet 0   Blood Glucose Monitoring Suppl (ACCU-CHEK GUIDE) w/Device KIT 1 each by Does not apply route in the morning, at noon, and at bedtime. 1 kit 0   ezetimibe (ZETIA) 10 MG tablet Take 10 mg by mouth daily.     famotidine (PEPCID) 20 MG tablet Take 20 mg by mouth 2 (two) times daily.     Glucagon, rDNA, (GLUCAGON EMERGENCY) 1 MG KIT Inject 1 mg into the muscle as needed (BG less than 70).     glucose blood (ACCU-CHEK GUIDE) test strip Use as instructed 100 each 5   isosorbide mononitrate (IMDUR) 60 MG 24 hr tablet Take 60 mg by mouth daily.      LANTUS SOLOSTAR 100 UNIT/ML Solostar Pen ADMINISTER 30 UNITS UNDER THE SKIN DAILY 15 mL 3   metFORMIN (GLUCOPHAGE) 500 MG tablet TAKE 1 TABLET BY MOUTH EVERY DAY WITH SUPPER.(ROUND WHITE TAB WITH H 102) 90 tablet 2   metoprolol succinate (TOPROL-XL) 25 MG 24 hr tablet TAKE 1 TABLET (25 MG TOTAL) BY MOUTH DAILY.(OVAL WHITE TAB WITH 564) 31 tablet 3   nitroGLYCERIN (NITROSTAT) 0.4 MG SL tablet DISSOLVE 1 TABLET UNDER TONGUE EVERY 5 MINUTES AS NEEDED CHEST PAIN. 25 tablet 1   pantoprazole (PROTONIX) 40 MG tablet TAKE ONE TABLET BY MOUTH TWO TIMES DAILY. (OVAL YELLOW TABLET WITH H126) 62 tablet 3   polyethylene glycol (MIRALAX / GLYCOLAX) 17 g packet Take 17 g by mouth daily.     ranolazine (RANEXA) 500 MG 12 hr tablet TAKE 1 TABLET BY MOUTH 2 TIMES DAILY (PEACH COLORED TABLET WITH C49 OR 588) 186 tablet 0   rosuvastatin (CRESTOR) 40 MG tablet Take 1 tablet (40 mg total) by mouth daily. 90 tablet 2   sacubitril-valsartan (ENTRESTO) 24-26 MG TAKE 1 TABLET BY MOUTH 2 TIMES DAILY.(OBLONG PINK TAB WITH LZ NVR) 186 tablet 0   Semaglutide, 1 MG/DOSE, 4 MG/3ML SOPN Inject 1  mg as directed once a week. 3 mL 2   sertraline (ZOLOFT) 25 MG tablet Take 25 mg by mouth daily.     spironolactone (ALDACTONE) 25 MG tablet TAKE 1/2 TABLET(12.5) BY MOUTH DAILY. (HALF TAN TAB). SCHEDULE OFFICE VISIT FOR FUTURE REFILLS. 15 tablet 0   tamsulosin (FLOMAX) 0.4 MG CAPS capsule TAKE 1 CAPSULE (.$RemoveBef'4MG'wVThDOdiSO$  TOTAL) BY MOUTH DAILY.(TAN AND GOLD CAPSULE WITH CL23 0.4) 93 capsule 3   traZODone (DESYREL) 150 MG tablet TAKE 1 TABLET($RemoveBefor'150MG'kDoVMqpVQArp$ ) BY MOUTH AT BEDTIME(OVAL WHITE TAB WITH 13 32 505050) 93 tablet 1   TRELEGY ELLIPTA 100-62.5-25 MCG/ACT AEPB Take 1 puff by mouth daily. 28 each 2   ULTICARE SHORT PEN NEEDLES 31G X 8 MM MISC USE AS DIRECTED (ONCE DAILY WITH LANTUS INJECTION AS INSTRUCTED) 100 each 4   No current facility-administered medications on file prior to visit.   Past Medical History:  Diagnosis Date   Acute combined systolic  and diastolic CHF, NYHA class 2 ([place]) 01/22/2020   Alcohol abuse    Atherosclerosis of coronary artery of native heart with stable angina pectoris, unspecified vessel or lesion type ([place]) 01/23/2016   Overview:  Completely occluded circumflex artery proximal to obtuse marginal 1 basin cardiac catheter from 2017   Chronic kidney disease    Chronic pain 10/19/2019   Sees pain clinic   COPD (chronic obstructive pulmonary disease) ([place])    Coronary artery disease involving native heart 02/08/2020   Coronary atherosclerosis of native coronary artery 01/23/2016   Overview:  Completely occluded circumflex artery proximal to obtuse marginal 1 basin cardiac catheter from 2017   Depression, major, single episode, moderate ([place]) 07/11/2020   Diabetic polyneuropathy ([place]) 12/06/2019   ED (erectile dysfunction) 12/06/2019   Essential hypertension 01/23/2016   Essential thrombocythemia ([place]) 09/29/2011   GERD without esophagitis    HFrEF (heart failure with reduced ejection fraction) ([place]) 02/08/2020   Knee pain, left 02/12/2012   Formatting of this note might be different from the original. Left   Leukocytosis 09/04/2011   Mixed hyperlipidemia 06/18/2017   Morbid obesity ([place]) 11/21/2020   Morbid obesity with BMI of 40.0-44.9, adult ([place]) 01/13/2013   Myocardial infarction ([place])    04-13-2011   Obstructive chronic bronchitis with exacerbation ([place]) 10/03/2019   Opiate abuse, continuous ([facility])    Osteoarthritis    Pain in knee joint 02/12/2012   Formatting of this note might be different from the original. Left   Pneumonia    Pneumonia due to infectious organism 09/04/2011   Polysubstance abuse ([place])    Poor dentition    Prostatitis 12/04/2020   Thrombocytosis 09/29/2011   Tobacco dependence 01/23/2016   Type 2 diabetes mellitus with diabetic polyneuropathy [facility])    Past Surgical History:  Procedure Laterality Date   HERNIA REPAIR  2009   I & D KNEE WITH POLY EXCHANGE  09/03/2011    Procedure: IRRIGATION AND DEBRIDEMENT KNEE WITH POLY EXCHANGE;  Surgeon: Mauri Pole, MD;  Location: [facility];  Service: Orthopedics;  Laterality: Left;   TOOTH EXTRACTION Bilateral 08/06/2017   Procedure: DENTAL RESTORATION/EXTRACTIONS;  Surgeon: Diona Browner, DDS;  Location: [place];  Service: Oral Surgery;  Laterality: Bilateral;   TOTAL KNEE ARTHROPLASTY   right  feb 2012   left march 2012 r   TOTAL KNEE REVISION  06/23/2011   Procedure: TOTAL KNEE REVISION;  Surgeon: Mauri Pole;  Location: [facility];  Service: Orthopedics;  Laterality: Left;  femomal nerve block done in holding area without incident  Family History  Problem Relation Age of Onset   Colon cancer Other    Colon cancer Mother    Social History   Socioeconomic History   Marital status: Widowed    Spouse name: Not on file   Number of children: 5   Years of education: Not on file   Highest education level: Not on file  Occupational History   Occupation: Disabled  Tobacco Use   Smoking status: Every Day    Packs/day: 0.50    Years: 50.00    Total pack years: 25.00    Types: Cigarettes   Smokeless tobacco: Never  Vaping Use   Vaping Use: Former  Substance and Sexual Activity   Alcohol use: Yes    Comment: heavy drinker in past. occasionally has a beer. Not on regular basis.   Drug use: Not Currently    Types: Cocaine   Sexual activity: Not Currently  Other Topics Concern   Not on file  Social History Narrative   Not on file   Social Determinants of Health   Financial Resource Strain: Low Risk  (02/10/2022)   Overall Financial Resource Strain (CARDIA)    Difficulty of Paying Living Expenses: Not hard at all  Food Insecurity: No Food Insecurity (02/10/2022)   Hunger Vital Sign    Worried About Running Out of Food in the Last Year: Never true    Ran Out of Food in the Last Year: Never true  Transportation Needs: No Transportation Needs (02/10/2022)   PRAPARE - Hydrologist  (Medical): No    Lack of Transportation (Non-Medical): No  Physical Activity: Inactive (02/10/2022)   Exercise Vital Sign    Days of Exercise per Week: 0 days    Minutes of Exercise per Session: 0 min  Stress: No Stress Concern Present (02/10/2022)   [place]    Feeling of Stress : Only a little  Social Connections: Socially Isolated (02/10/2022)   Social Connection and Isolation Panel [NHANES]    Frequency of Communication with Friends and Family: More than three times a week    Frequency of Social Gatherings with Friends and Family: Three times a week    Attends Religious Services: Never    Active Member of Clubs or Organizations: No    Attends Archivist Meetings: Never    Marital Status: Widowed    Review of Systems  Constitutional:  Negative for appetite change, fatigue and fever.  HENT:  Negative for congestion, ear pain, sinus pressure and sore throat.   Respiratory:  Negative for cough, chest tightness, shortness of breath and wheezing.   Cardiovascular:  Negative for chest pain and palpitations.  Gastrointestinal:  Negative for abdominal pain, constipation, diarrhea, nausea and vomiting.  Genitourinary:  Negative for dysuria and hematuria.  Musculoskeletal:  Positive for arthralgias (Left elbow pain) and joint swelling (left elbow). Negative for back pain and myalgias.  Skin:  Negative for rash.  Neurological:  Negative for dizziness, weakness and headaches.  Psychiatric/Behavioral:  Negative for dysphoric mood. The patient is not nervous/anxious.      Objective:  BP 120/80   Pulse (!) 130   Temp 99.7 F (37.6 C)   Resp 18   Ht $R'5\' 8"'XD$  (1.727 m)   Wt 295 lb (133.8 kg)   SpO2 95%   BMI 44.85 kg/m      02/10/2022    2:16 PM 02/09/2022   11:09 AM 01/09/2022  8:32 AM  BP/Weight  Systolic BP 546 270 350  Diastolic BP 80 80 90  Wt. (Lbs) 295 295 299  BMI 44.85 kg/m2 44.85 kg/m2 45.46 kg/m2     Physical Exam Vitals reviewed.  Constitutional:      Appearance: Normal appearance. He is normal weight.  Cardiovascular:     Rate and Rhythm: Normal rate and regular rhythm.     Pulses: Normal pulses.     Heart sounds: Normal heart sounds.  Pulmonary:     Effort: Pulmonary effort is normal.     Breath sounds: Normal breath sounds.  Abdominal:     General: Abdomen is flat. Bowel sounds are normal.     Palpations: Abdomen is soft.  Musculoskeletal:        General: Tenderness (left elbow and left upper forearm. effusion.) present.  Neurological:     Mental Status: He is alert and oriented to person, place, and time.  Psychiatric:        Mood and Affect: Mood normal.        Behavior: Behavior normal.     Diabetic Foot Exam - Simple   No data filed      Lab Results  Component Value Date   WBC 12.9 (H) 01/09/2022   HGB 13.8 01/09/2022   HCT 41.3 01/09/2022   PLT 489 (H) 01/09/2022   GLUCOSE 142 (H) 01/09/2022   CHOL 193 12/29/2021   TRIG 163 (H) 12/29/2021   HDL 40 12/29/2021   LDLCALC 124 (H) 12/29/2021   ALT 10 01/09/2022   AST 5 01/09/2022   NA 138 01/09/2022   K 4.6 01/09/2022   CL 101 01/09/2022   CREATININE 1.08 01/09/2022   BUN 13 01/09/2022   CO2 21 01/09/2022   TSH 0.719 01/24/2021   INR 1.81 (H) 09/07/2011   HGBA1C 6.4 (H) 12/29/2021   MICROALBUR 80 09/27/2020      Assessment & Plan:   Problem List Items Addressed This Visit       Musculoskeletal and Integument   Olecranon bursitis of left elbow    Risks were discussed including bleeding, infection, increase in sugars if diabetic, atrophy at site of injection, and increased pain.  After consent was obtained, using sterile technique the olecranon bursitis was prepped with alcohol.  A wheal of lidocaine made. The bursa was entered and 1 ml of blood tinged colored fluid was withdrawn and sent for pathology.  Kenalog 20 mg and 3 ml plain Lidocaine was then injected and the needle withdrawn.  The  procedure was well tolerated.   The patient is asked to continue to rest the joint for a few more days before resuming regular activities.  It may be more painful for the first 1-2 days.  Watch for fever, or increased swelling or persistent pain in the joint. Call or return to clinic prn if such symptoms occur or there is failure to improve as anticipated.       Relevant Medications   triamcinolone acetonide (KENALOG-40) injection 20 mg   Acute gout of left elbow    Start on colchicine 0.6 mg twice daily x 1 week, then decrease to once daily.  If you are taking allopurinol 100 mg daily, continue it.  If you are not, restart allopurinol 100 mg once daily in 2 weeks.      Relevant Medications   triamcinolone acetonide (KENALOG-40) injection 20 mg   RESOLVED: Acute drug-induced gout of left elbow   Relevant Medications   triamcinolone acetonide (KENALOG-40)  injection 20 mg     Other   Chronic pain    Continue percocet.      Relevant Medications   sertraline (ZOLOFT) 25 MG tablet   triamcinolone acetonide (KENALOG-40) injection 20 mg   Cellulitis of right upper extremity - Primary    Resolved.      Relevant Orders   Pathology ([facility])   Mild major depression, single episode ([place])    Start on zoloft 25 mg daily.       Relevant Medications   sertraline (ZOLOFT) 25 MG tablet   Mixed incontinence    Uses 2 pull ups daily.      .  Meds ordered this encounter  Medications   DISCONTD: colchicine 0.6 MG tablet    Sig: Take 1 tablet (0.6 mg total) by mouth 2 (two) times daily for 7 days, THEN 1 tablet (0.6 mg total) daily for 23 days.    Dispense:  37 tablet    Refill:  0    Patient instructed to hold crestor while taking twice daily dose.   triamcinolone acetonide (KENALOG-40) injection 20 mg    No orders of the defined types were placed in this encounter.    Follow-up: Return in about 7 weeks (around 04/01/2022) for chronic fasting, 40 minutes please.  An After Visit  Summary was printed and given to the patient.  [name], MD [facility] [phone]

## 2022-02-10 ENCOUNTER — Ambulatory Visit: Payer: Medicare Other | Admitting: Family Medicine

## 2022-02-10 ENCOUNTER — Encounter: Payer: Self-pay | Admitting: Family Medicine

## 2022-02-10 ENCOUNTER — Other Ambulatory Visit: Payer: Self-pay

## 2022-02-10 VITALS — BP 120/80 | Ht 68.0 in | Wt 295.0 lb

## 2022-02-10 DIAGNOSIS — Z0000 Encounter for general adult medical examination without abnormal findings: Secondary | ICD-10-CM | POA: Diagnosis not present

## 2022-02-10 MED ORDER — OXYCODONE-ACETAMINOPHEN 5-325 MG PO TABS
1.0000 | ORAL_TABLET | Freq: Four times a day (QID) | ORAL | 0 refills | Status: DC | PRN
Start: 2022-02-10 — End: 2022-03-10

## 2022-02-10 NOTE — Patient Instructions (Signed)
Brent Ortiz , Thank you for taking time to come for your Medicare Wellness Visit. I appreciate your ongoing commitment to your health goals. Please review the following plan we discussed and let me know if I can assist you in the future.   Screening recommendations/referrals: Colonoscopy: Up to date Recommended yearly ophthalmology/optometry visit for glaucoma screening and checkup Recommended yearly dental visit for hygiene and checkup  Vaccinations: Influenza vaccine: Due 03/2022 Pneumococcal vaccine: Up to date, due again at age 60 Tdap vaccine: Due can get at local pharmacy or health dept Shingles vaccine: Looks like second shot in series was not completed  Advanced directives: Needs to review this with daughter  Conditions/risks identified: Falls, please be careful moving around, I am glad you were not more seriously injured in your fall this week.   Next appointment: As needed   Preventive Care 40-64 Years, Male Preventive care refers to lifestyle choices and visits with your health care provider that can promote health and wellness. What does preventive care include? A yearly physical exam. This is also called an annual well check. Dental exams once or twice a year. Routine eye exams. Ask your health care provider how often you should have your eyes checked. Personal lifestyle choices, including: Daily care of your teeth and gums. Regular physical activity. Eating a healthy diet. Avoiding tobacco and drug use. Limiting alcohol use. Practicing safe sex. Taking low-dose aspirin every day starting at age 57. What happens during an annual well check? The services and screenings done by your health care provider during your annual well check will depend on your age, overall health, lifestyle risk factors, and family history of disease. Counseling  Your health care provider may ask you questions about your: Alcohol use. Tobacco use. Drug use. Emotional well-being. Home and  relationship well-being. Sexual activity. Eating habits. Work and work Statistician. Screening  You may have the following tests or measurements: Height, weight, and BMI. Blood pressure. Lipid and cholesterol levels. These may be checked every 5 years, or more frequently if you are over 54 years old. Skin check. Lung cancer screening. You may have this screening every year starting at age 53 if you have a 30-pack-year history of smoking and currently smoke or have quit within the past 15 years. Fecal occult blood test (FOBT) of the stool. You may have this test every year starting at age 67. Flexible sigmoidoscopy or colonoscopy. You may have a sigmoidoscopy every 5 years or a colonoscopy every 10 years starting at age 5. Prostate cancer screening. Recommendations will vary depending on your family history and other risks. Hepatitis C blood test. Hepatitis B blood test. Sexually transmitted disease (STD) testing. Diabetes screening. This is done by checking your blood sugar (glucose) after you have not eaten for a while (fasting). You may have this done every 1-3 years. Discuss your test results, treatment options, and if necessary, the need for more tests with your health care provider. Vaccines  Your health care provider may recommend certain vaccines, such as: Influenza vaccine. This is recommended every year. Tetanus, diphtheria, and acellular pertussis (Tdap, Td) vaccine. You may need a Td booster every 10 years. Zoster vaccine. You may need this after age 79. Pneumococcal 13-valent conjugate (PCV13) vaccine. You may need this if you have certain conditions and have not been vaccinated. Pneumococcal polysaccharide (PPSV23) vaccine. You may need one or two doses if you smoke cigarettes or if you have certain conditions. Talk to your health care provider about which screenings and  vaccines you need and how often you need them. This information is not intended to replace advice given to  you by your health care provider. Make sure you discuss any questions you have with your health care provider. Document Released: 07/19/2015 Document Revised: 03/11/2016 Document Reviewed: 04/23/2015 Elsevier Interactive Patient Education  2017 [employer] Prevention in the Home Falls can cause injuries. They can happen to people of all ages. There are many things you can do to make your home safe and to help prevent falls. What can I do on the outside of my home? Regularly fix the edges of walkways and driveways and fix any cracks. Remove anything that might make you trip as you walk through a door, such as a raised step or threshold. Trim any bushes or trees on the path to your home. Use bright outdoor lighting. Clear any walking paths of anything that might make someone trip, such as rocks or tools. Regularly check to see if handrails are loose or broken. Make sure that both sides of any steps have handrails. Any raised decks and porches should have guardrails on the edges. Have any leaves, snow, or ice cleared regularly. Use sand or salt on walking paths during winter. Clean up any spills in your garage right away. This includes oil or grease spills. What can I do in the bathroom? Use night lights. Install grab bars by the toilet and in the tub and shower. Do not use towel bars as grab bars. Use non-skid mats or decals in the tub or shower. If you need to sit down in the shower, use a plastic, non-slip stool. Keep the floor dry. Clean up any water that spills on the floor as soon as it happens. Remove soap buildup in the tub or shower regularly. Attach bath mats securely with double-sided non-slip rug tape. Do not have throw rugs and other things on the floor that can make you trip. What can I do in the bedroom? Use night lights. Make sure that you have a light by your bed that is easy to reach. Do not use any sheets or blankets that are too big for your bed. They should not  hang down onto the floor. Have a firm chair that has side arms. You can use this for support while you get dressed. Do not have throw rugs and other things on the floor that can make you trip. What can I do in the kitchen? Clean up any spills right away. Avoid walking on wet floors. Keep items that you use a lot in easy-to-reach places. If you need to reach something above you, use a strong step stool that has a grab bar. Keep electrical cords out of the way. Do not use floor polish or wax that makes floors slippery. If you must use wax, use non-skid floor wax. Do not have throw rugs and other things on the floor that can make you trip. What can I do with my stairs? Do not leave any items on the stairs. Make sure that there are handrails on both sides of the stairs and use them. Fix handrails that are broken or loose. Make sure that handrails are as long as the stairways. Check any carpeting to make sure that it is firmly attached to the stairs. Fix any carpet that is loose or worn. Avoid having throw rugs at the top or bottom of the stairs. If you do have throw rugs, attach them to the floor with carpet tape. Make  sure that you have a light switch at the top of the stairs and the bottom of the stairs. If you do not have them, ask someone to add them for you. What else can I do to help prevent falls? Wear shoes that: Do not have high heels. Have rubber bottoms. Are comfortable and fit you well. Are closed at the toe. Do not wear sandals. If you use a stepladder: Make sure that it is fully opened. Do not climb a closed stepladder. Make sure that both sides of the stepladder are locked into place. Ask someone to hold it for you, if possible. Clearly mark and make sure that you can see: Any grab bars or handrails. First and last steps. Where the edge of each step is. Use tools that help you move around (mobility aids) if they are needed. These  include: Canes. Walkers. Scooters. Crutches. Turn on the lights when you go into a dark area. Replace any light bulbs as soon as they burn out. Set up your furniture so you have a clear path. Avoid moving your furniture around. If any of your floors are uneven, fix them. If there are any pets around you, be aware of where they are. Review your medicines with your doctor. Some medicines can make you feel dizzy. This can increase your chance of falling. Ask your doctor what other things that you can do to help prevent falls. This information is not intended to replace advice given to you by your health care provider. Make sure you discuss any questions you have with your health care provider. Document Released: 04/18/2009 Document Revised: 11/28/2015 Document Reviewed: 07/27/2014 Elsevier Interactive Patient Education  2017 [employer].

## 2022-02-10 NOTE — Progress Notes (Signed)
I connected with  Brent Ortiz on 02/10/22 by a audio enabled telemedicine application and verified that I am speaking with the correct person using two identifiers.  Patient Location: Home  Provider Location: Home Office  I discussed the limitations of evaluation and management by telemedicine. The patient expressed understanding and agreed to proceed.   Subjective:   Brent Ortiz is a 61 y.o. male who presents for an Initial Medicare Annual Wellness Visit.   Cardiac Risk Factors include: advanced age (>65men, >8 women);diabetes mellitus;dyslipidemia;hypertension;male gender;obesity (BMI >30kg/m2);sedentary lifestyle;smoking/ tobacco exposure     Objective:    Today's Vitals   02/10/22 1416  PainSc: 7    There is no height or weight on file to calculate BMI.     08/06/2017    6:40 AM 12/29/2016    2:44 AM 12/28/2016    6:26 PM 09/04/2011    1:00 AM 09/03/2011    6:24 PM 06/23/2011    6:32 PM 06/23/2011    6:30 PM  Advanced Directives  Does Patient Have a Medical Advance Directive? No No No Patient does not have advance directive;Patient would not like information Patient does not have advance directive;Patient would not like information Patient would not like information;Patient does not have advance directive Patient does not have advance directive;Patient would not like information  Would patient like information on creating a medical advance directive?  No - Patient declined;Yes (ED - Information included in AVS) No - Patient declined      Pre-existing out of facility DNR order (yellow form or pink MOST form)    No No  No    Current Medications (verified) Outpatient Encounter Medications as of 02/10/2022  Medication Sig   Accu-Chek Softclix Lancets lancets Use as instructed   albuterol (VENTOLIN HFA) 108 (90 Base) MCG/ACT inhaler INHALE 2 PUFFS INTO THE LUNGS EVERY 4 HOURS AS NEEDED FOR WHEEZING OR SHORTNESS OF BREATH.   allopurinol (ZYLOPRIM) 100 MG tablet TAKE  1 TABLET BY MOUTH ONCE DAILY (WHITE ROUND TABLET WITH 209)   amLODipine (NORVASC) 5 MG tablet Take 5 mg by mouth daily.   aspirin EC (GOODSENSE ASPIRIN LOW DOSE) 81 MG tablet TAKE 1 TABLET ($RemoveB'81MG'IbfXXUUI$ ) BY MOUTH DAILY. SWALLOW WHOLE.(YELLOW ROUND TAB WITH A HEART)   Blood Glucose Monitoring Suppl (ACCU-CHEK GUIDE) w/Device KIT 1 each by Does not apply route in the morning, at noon, and at bedtime.   clopidogrel (PLAVIX) 75 MG tablet TAKE 1 TABLET BY MOUTH ONCE DAILY (ROUND PINK TABLET WITH E 34)   colchicine 0.6 MG tablet TAKE 1 TABLET(0.6 MG) BY MOUTH TWICE DAILY FOR 7 DAYS THEN TAKE 1 TABLET(0.6 MG) BY MOUTH DAILY FOR 23 DAYS   ezetimibe (ZETIA) 10 MG tablet Take 10 mg by mouth daily.   famotidine (PEPCID) 20 MG tablet Take 20 mg by mouth 2 (two) times daily.   Glucagon, rDNA, (GLUCAGON EMERGENCY) 1 MG KIT Inject 1 mg into the muscle as needed (BG less than 70).   glucose blood (ACCU-CHEK GUIDE) test strip Use as instructed   isosorbide mononitrate (IMDUR) 60 MG 24 hr tablet Take 60 mg by mouth daily.   LANTUS SOLOSTAR 100 UNIT/ML Solostar Pen ADMINISTER 30 UNITS UNDER THE SKIN DAILY   metFORMIN (GLUCOPHAGE) 500 MG tablet TAKE 1 TABLET BY MOUTH EVERY DAY WITH SUPPER.(ROUND WHITE TAB WITH H 102)   metoprolol succinate (TOPROL-XL) 25 MG 24 hr tablet TAKE 1 TABLET (25 MG TOTAL) BY MOUTH DAILY.(OVAL WHITE TAB WITH 564)   nitroGLYCERIN (  NITROSTAT) 0.4 MG SL tablet DISSOLVE 1 TABLET UNDER TONGUE EVERY 5 MINUTES AS NEEDED CHEST PAIN.   oxyCODONE-acetaminophen (PERCOCET/ROXICET) 5-325 MG tablet Take 1 tablet by mouth every 6 (six) hours as needed for severe pain.   pantoprazole (PROTONIX) 40 MG tablet TAKE ONE TABLET BY MOUTH TWO TIMES DAILY. (OVAL YELLOW TABLET WITH H126)   polyethylene glycol (MIRALAX / GLYCOLAX) 17 g packet Take 17 g by mouth daily.   ranolazine (RANEXA) 500 MG 12 hr tablet TAKE 1 TABLET BY MOUTH 2 TIMES DAILY (PEACH COLORED TABLET WITH C49 OR 588)   rosuvastatin (CRESTOR) 40 MG tablet Take  1 tablet (40 mg total) by mouth daily.   sacubitril-valsartan (ENTRESTO) 24-26 MG TAKE 1 TABLET BY MOUTH 2 TIMES DAILY.(OBLONG PINK TAB WITH LZ NVR)   Semaglutide, 1 MG/DOSE, 4 MG/3ML SOPN Inject 1 mg as directed once a week.   sertraline (ZOLOFT) 25 MG tablet Take 25 mg by mouth daily.   spironolactone (ALDACTONE) 25 MG tablet TAKE 1/2 TABLET(12.5) BY MOUTH DAILY. (HALF TAN TAB). SCHEDULE OFFICE VISIT FOR FUTURE REFILLS.   tamsulosin (FLOMAX) 0.4 MG CAPS capsule TAKE 1 CAPSULE (.4MG  TOTAL) BY MOUTH DAILY.(TAN AND GOLD CAPSULE WITH CL23 0.4)   traZODone (DESYREL) 150 MG tablet TAKE 1 TABLET(150MG ) BY MOUTH AT BEDTIME(OVAL WHITE TAB WITH 13 32 [id number])   TRELEGY ELLIPTA 100-62.5-25 MCG/ACT AEPB Take 1 puff by mouth daily.   ULTICARE SHORT PEN NEEDLES 31G X 8 MM MISC USE AS DIRECTED (ONCE DAILY WITH LANTUS INJECTION AS INSTRUCTED)   No facility-administered encounter medications on file as of 02/10/2022.    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, unspecified vessel or lesion type (HCC) 01/23/2016   Overview:  Completely occluded circumflex artery proximal to obtuse marginal 1 basin cardiac catheter from 2017   Chronic kidney disease    Chronic pain 10/19/2019   Sees pain clinic   COPD (chronic obstructive pulmonary disease) (HCC)    Coronary artery disease involving native heart 02/08/2020   Coronary atherosclerosis of native coronary artery 01/23/2016   Overview:  Completely occluded circumflex artery proximal to obtuse marginal 1 basin cardiac catheter from 2017   Depression, major, single episode, moderate (HCC) 07/11/2020   Diabetic polyneuropathy (HCC) 12/06/2019   ED (erectile dysfunction) 12/06/2019   Essential hypertension 01/23/2016   Essential  thrombocythemia (HCC) 09/29/2011   GERD without esophagitis    HFrEF (heart failure with reduced ejection fraction) (HCC) 02/08/2020   Knee pain, left 02/12/2012   Formatting of this note might be different from the original. Left   Leukocytosis 09/04/2011   Mixed hyperlipidemia 06/18/2017   Morbid obesity (HCC) 11/21/2020   Morbid obesity with BMI of 40.0-44.9, adult (HCC) 01/13/2013   Myocardial infarction (HCC)    04-13-2011   Obstructive chronic bronchitis with exacerbation (HCC) 10/03/2019   Opiate abuse, continuous (HCC)    Osteoarthritis    Pain in knee joint 02/12/2012   Formatting of this note might be different from the original. Left   Pneumonia    Pneumonia due to infectious organism 09/04/2011   Polysubstance abuse (HCC)    Poor dentition    Prostatitis 12/04/2020   Thrombocytosis 09/29/2011   Tobacco dependence 01/23/2016   Type 2 diabetes mellitus with diabetic polyneuropathy (HCC)  Past Surgical History:  Procedure Laterality Date   HERNIA REPAIR  2009   I & D KNEE WITH POLY EXCHANGE  09/03/2011   Procedure: IRRIGATION AND DEBRIDEMENT KNEE WITH POLY EXCHANGE;  Surgeon: Mauri Pole, MD;  Location: [facility];  Service: Orthopedics;  Laterality: Left;   TOOTH EXTRACTION Bilateral 08/06/2017   Procedure: DENTAL RESTORATION/EXTRACTIONS;  Surgeon: Diona Browner, DDS;  Location: [facility];  Service: Oral Surgery;  Laterality: Bilateral;   TOTAL KNEE ARTHROPLASTY   right  feb 2012   left march 2012 r   TOTAL KNEE REVISION  06/23/2011   Procedure: TOTAL KNEE REVISION;  Surgeon: Mauri Pole;  Location: [facility];  Service: Orthopedics;  Laterality: Left;  femomal nerve block done in holding area without incident   Family History  Problem Relation Age of Onset   Colon cancer Other    Colon cancer Mother    Social History   Socioeconomic History   Marital status: Widowed    Spouse name: Not on file   Number of children: 5   Years of education: Not on file   Highest  education level: Not on file  Occupational History   Occupation: Disabled  Tobacco Use   Smoking status: Every Day    Packs/day: 0.50    Years: 50.00    Total pack years: 25.00    Types: Cigarettes   Smokeless tobacco: Never  Vaping Use   Vaping Use: Former  Substance and Sexual Activity   Alcohol use: Yes    Comment: heavy drinker in past. occasionally has a beer. Not on regular basis.   Drug use: Not Currently    Types: Cocaine   Sexual activity: Not Currently  Other Topics Concern   Not on file  Social History Narrative   Not on file   Social Determinants of Health   Financial Resource Strain: Low Risk  (02/10/2022)   Overall Financial Resource Strain (CARDIA)    Difficulty of Paying Living Expenses: Not hard at all  Food Insecurity: No Food Insecurity (02/10/2022)   Hunger Vital Sign    Worried About Running Out of Food in the Last Year: Never true    Ran Out of Food in the Last Year: Never true  Transportation Needs: No Transportation Needs (02/10/2022)   PRAPARE - Hydrologist (Medical): No    Lack of Transportation (Non-Medical): No  Physical Activity: Inactive (02/10/2022)   Exercise Vital Sign    Days of Exercise per Week: 0 days    Minutes of Exercise per Session: 0 min  Stress: No Stress Concern Present (02/10/2022)   [place]    Feeling of Stress : Only a little  Social Connections: Socially Isolated (02/10/2022)   Social Connection and Isolation Panel [NHANES]    Frequency of Communication with Friends and Family: More than three times a week    Frequency of Social Gatherings with Friends and Family: Three times a week    Attends Religious Services: Never    Active Member of Clubs or Organizations: No    Attends Archivist Meetings: Never    Marital Status: Widowed    Tobacco Counseling Ready to quit: Not Answered Counseling given: Not  Answered   Clinical Intake:  Pre-visit preparation completed: No  Pain : 0-10 Pain Score: 7  Pain Type: Chronic pain Pain Location: Other (Comment) Pain Descriptors / Indicators: Aching Pain Onset: In the past 7 days Pain  Frequency: Constant     Nutritional Risks: None Diabetes: Yes CBG done?: No Did pt. bring in CBG monitor from home?: No  How often do you need to have someone help you when you read instructions, pamphlets, or other written materials from your doctor or pharmacy?: 1 - Never  Diabetic?yes  Nutrition Risk Assessment:  Has the patient had any N/V/D within the last 2 months?  No  Does the patient have any non-healing wounds?  No  Has the patient had any unintentional weight loss or weight gain?  No   Diabetes:  Is the patient diabetic?  Yes  If diabetic, was a CBG obtained today?  No  Did the patient bring in their glucometer from home?   N/a How often do you monitor your CBG's? Twice weekly.   Financial Strains and Diabetes Management:  Are you having any financial strains with the device, your supplies or your medication? No .  Does the patient want to be seen by Chronic Care Management for management of their diabetes?  No  Would the patient like to be referred to a Nutritionist or for Diabetic Management?  No   Diabetic Exams:  Diabetic Eye Exam: Overdue for diabetic eye exam. Pt has been advised about the importance in completing this exam. Patient advised to call and schedule an eye exam. Diabetic Foot Exam: Completed 12/2022   Interpreter Needed?: No      Activities of Daily Living    02/10/2022    2:24 PM  In your present state of health, do you have any difficulty performing the following activities:  Hearing? 1  Comment hearing aids  Vision? 1  Comment needs eye check  Difficulty concentrating or making decisions? 1  Walking or climbing stairs? 1  Dressing or bathing? 1  Doing errands, shopping? 1  Preparing Food and eating ? Y   Using the Toilet? Y  In the past six months, have you accidently leaked urine? Y  Do you have problems with loss of bowel control? N  Managing your Medications? Y  Managing your Finances? Y  Housekeeping or managing your Housekeeping? Y    Patient Care Team: [name], MD as PCP - General (Internal Medicine) Burnice Logan, [facility] (Inactive) as Pharmacist (Pharmacist) Lane Hacker, [place] as Pharmacist (Pharmacist)  Indicate any recent Medical Services you may have received from other than [facility] providers in the past year (date may be approximate).     Assessment:   This is a routine wellness examination for Brent Ortiz.  Hearing/Vision screen No results found.  Dietary issues and exercise activities discussed: Current Exercise Habits: The patient does not participate in regular exercise at present   Goals Addressed   None   Depression Screen    02/10/2022    2:21 PM 12/29/2021    9:18 AM 11/17/2021    7:35 AM 09/24/2021    9:45 AM 05/27/2021   10:22 AM 05/01/2021   10:12 AM 01/24/2021    8:44 AM  PHQ 2/9 Scores  PHQ - 2 Score $Remov'2 4 2 3 2 1 2  'upJSLU$ PHQ- 9 Score $Remov'10 11 10 12 14  10    'CxedMc$ Fall Risk    02/10/2022    2:24 PM 12/29/2021    9:18 AM 11/17/2021    7:35 AM 06/03/2020   10:03 AM 12/06/2019    8:55 AM  Fall Risk   Falls in the past year? $RemoveBe'1  1 1 1  'OeeCYpywm$ Number falls in past yr: 1 1  $'1 1 1  'r$ Injury with Fall? 1 0 0 0 0  Risk for fall due to : History of fall(s);Impaired balance/gait;Impaired mobility History of fall(s)  History of fall(s) Impaired balance/gait;Impaired mobility  Follow up Falls evaluation completed;Falls prevention discussed;Education provided Falls evaluation completed Falls evaluation completed Falls evaluation completed;Education provided Falls evaluation completed    FALL RISK PREVENTION PERTAINING TO THE HOME:  Any stairs in or around the home? No  If so, are there any without handrails? No  Home free of loose throw rugs in walkways, pet beds, electrical cords,  etc? Yes  Adequate lighting in your home to reduce risk of falls? Yes   ASSISTIVE DEVICES UTILIZED TO PREVENT FALLS:  Life alert? Yes  Use of a cane, walker or w/c? Yes  Grab bars in the bathroom? Yes  Shower chair or bench in shower? No  Elevated toilet seat or a handicapped toilet? No    Cognitive Function:        02/10/2022    2:26 PM 06/03/2020    9:53 AM  6CIT Screen  What Year? 0 points 0 points  What month? 0 points 0 points  What time? 0 points 0 points  Count back from 20 0 points 0 points  Months in reverse 0 points 0 points  Repeat phrase 0 points 0 points  Total Score 0 points 0 points    Immunizations Immunization History  Administered Date(s) Administered   Influenza Inj Mdck Quad Pf 06/03/2020, 05/27/2021   Influenza-Unspecified 05/07/2019   PFIZER(Purple Top)SARS-COV-2 Vaccination 10/26/2019, 11/24/2019, 07/13/2020   Pneumococcal Polysaccharide-23 08/06/2013, 04/06/2019   Pneumococcal-Unspecified 05/07/2019   Zoster Recombinat (Shingrix) 05/07/2019    TDAP status: Due, Education has been provided regarding the importance of this vaccine. Advised may receive this vaccine at local pharmacy or Health Dept. Aware to provide a copy of the vaccination record if obtained from local pharmacy or Health Dept. Verbalized acceptance and understanding.  Flu Vaccine status: Up to date  Pneumococcal vaccine status: Up to date  Covid-19 vaccine status: Completed vaccines  Qualifies for Shingles Vaccine? Yes   Zostavax completed No   Shingrix Completed?: Yes  Screening Tests Health Maintenance  Topic Date Due   HIV Screening  Never done   Hepatitis C Screening  Never done   TETANUS/TDAP  Never done   Zoster Vaccines- Shingrix (2 of 2) 07/02/2019   COVID-19 Vaccine (4 - Booster for Pfizer series) 09/07/2020   OPHTHALMOLOGY EXAM  11/12/2021   INFLUENZA VACCINE  02/03/2022   HEMOGLOBIN A1C  06/30/2022   FOOT EXAM  12/30/2022   COLONOSCOPY (Pts 45-69yrs  Insurance coverage will need to be confirmed)  07/06/2024   HPV VACCINES  Aged Out    Health Maintenance  Health Maintenance Due  Topic Date Due   HIV Screening  Never done   Hepatitis C Screening  Never done   TETANUS/TDAP  Never done   Zoster Vaccines- Shingrix (2 of 2) 07/02/2019   COVID-19 Vaccine (4 - Booster for Pfizer series) 09/07/2020   OPHTHALMOLOGY EXAM  11/12/2021   INFLUENZA VACCINE  02/03/2022    Colorectal cancer screening: Type of screening: Colonoscopy. Completed  . Repeat every 10 years  Lung Cancer Screening: (Low Dose CT Chest recommended if Age 78-80 years, 30 pack-year currently smoking OR have quit w/in 15years.) does qualify.   Lung Cancer Screening Referral: needs  Additional Screening:  Hepatitis C Screening: does qualify; Completed   Vision Screening: Recommended annual ophthalmology exams for early detection of glaucoma  and other disorders of the eye. Is the patient up to date with their annual eye exam?  No  Who is the provider or what is the name of the office in which the patient attends annual eye exams? Needs a referral If pt is not established with a provider, would they like to be referred to a provider to establish care? Yes .   Dental Screening: Recommended annual dental exams for proper oral hygiene  Community Resource Referral / Chronic Care Management: CRR required this visit?  No   CCM required this visit?  No      Plan:     I have personally reviewed and noted the following in the patient's chart:   Medical and social history Use of alcohol, tobacco or illicit drugs  Current medications and supplements including opioid prescriptions. Patient is not currently taking opioid prescriptions. Functional ability and status Nutritional status Physical activity Advanced directives List of other physicians Hospitalizations, surgeries, and ER visits in previous 12 months Vitals Screenings to include cognitive, depression, and  falls Referrals and appointments  In addition, I have reviewed and discussed with patient certain preventive protocols, quality metrics, and best practice recommendations. A written personalized care plan for preventive services as well as general preventive health recommendations were provided to patient.     [name], NP   02/10/2022

## 2022-02-11 ENCOUNTER — Other Ambulatory Visit: Payer: Self-pay

## 2022-02-11 DIAGNOSIS — Z87891 Personal history of nicotine dependence: Secondary | ICD-10-CM

## 2022-02-14 ENCOUNTER — Encounter: Payer: Self-pay | Admitting: Family Medicine

## 2022-02-14 DIAGNOSIS — M109 Gout, unspecified: Secondary | ICD-10-CM

## 2022-02-14 DIAGNOSIS — F320 Major depressive disorder, single episode, mild: Secondary | ICD-10-CM

## 2022-02-14 DIAGNOSIS — M7022 Olecranon bursitis, left elbow: Secondary | ICD-10-CM | POA: Insufficient documentation

## 2022-02-14 DIAGNOSIS — M10222 Drug-induced gout, left elbow: Secondary | ICD-10-CM | POA: Insufficient documentation

## 2022-02-14 HISTORY — DX: Major depressive disorder, single episode, mild: F32.0

## 2022-02-14 HISTORY — DX: Olecranon bursitis, left elbow: M70.22

## 2022-02-14 HISTORY — DX: Gout, unspecified: M10.9

## 2022-02-14 MED ORDER — TRIAMCINOLONE ACETONIDE 40 MG/ML IJ SUSP: 20.0000 mg | Freq: Once | INTRAMUSCULAR | Status: DC

## 2022-02-14 NOTE — Assessment & Plan Note (Signed)
Continue percocet  

## 2022-02-14 NOTE — Assessment & Plan Note (Signed)
Risks were discussed including bleeding, infection, increase in sugars if diabetic, atrophy at site of injection, and increased pain.  After consent was obtained, using sterile technique the olecranon bursitis was prepped with alcohol.  A wheal of lidocaine made. The bursa was entered and 1 ml of blood tinged colored fluid was withdrawn and sent for pathology.  Kenalog 20 mg and 3 ml plain Lidocaine was then injected and the needle withdrawn.  The procedure was well tolerated.   The patient is asked to continue to rest the joint for a few more days before resuming regular activities.  It may be more painful for the first 1-2 days.  Watch for fever, or increased swelling or persistent pain in the joint. Call or return to clinic prn if such symptoms occur or there is failure to improve as anticipated.

## 2022-02-14 NOTE — Assessment & Plan Note (Signed)
Start on colchicine 0.6 mg twice daily x 1 week, then decrease to once daily.  If you are taking allopurinol 100 mg daily, continue it.  If you are not, restart allopurinol 100 mg once daily in 2 weeks.

## 2022-02-14 NOTE — Assessment & Plan Note (Signed)
Start on zoloft 25 mg daily.

## 2022-02-14 NOTE — Assessment & Plan Note (Signed)
Resolved

## 2022-02-18 DIAGNOSIS — G4733 Obstructive sleep apnea (adult) (pediatric): Secondary | ICD-10-CM | POA: Diagnosis not present

## 2022-02-18 DIAGNOSIS — J432 Centrilobular emphysema: Secondary | ICD-10-CM | POA: Diagnosis not present

## 2022-02-18 DIAGNOSIS — Z122 Encounter for screening for malignant neoplasm of respiratory organs: Secondary | ICD-10-CM | POA: Diagnosis not present

## 2022-02-18 DIAGNOSIS — F17210 Nicotine dependence, cigarettes, uncomplicated: Secondary | ICD-10-CM | POA: Diagnosis not present

## 2022-02-18 DIAGNOSIS — I2510 Atherosclerotic heart disease of native coronary artery without angina pectoris: Secondary | ICD-10-CM | POA: Diagnosis not present

## 2022-02-18 DIAGNOSIS — I700 Atherosclerosis of aorta: Secondary | ICD-10-CM | POA: Diagnosis not present

## 2022-02-20 LAB — SPECIMEN STATUS REPORT

## 2022-02-23 ENCOUNTER — Other Ambulatory Visit: Payer: Self-pay

## 2022-02-23 DIAGNOSIS — R911 Solitary pulmonary nodule: Secondary | ICD-10-CM

## 2022-02-23 DIAGNOSIS — I2510 Atherosclerotic heart disease of native coronary artery without angina pectoris: Secondary | ICD-10-CM

## 2022-02-24 ENCOUNTER — Other Ambulatory Visit: Payer: Self-pay

## 2022-02-24 ENCOUNTER — Other Ambulatory Visit: Payer: Self-pay | Admitting: Family Medicine

## 2022-02-24 DIAGNOSIS — N3946 Mixed incontinence: Secondary | ICD-10-CM | POA: Insufficient documentation

## 2022-02-24 DIAGNOSIS — Z87891 Personal history of nicotine dependence: Secondary | ICD-10-CM

## 2022-02-24 HISTORY — DX: Mixed incontinence: N39.46

## 2022-02-24 NOTE — Assessment & Plan Note (Signed)
Uses 2 pull ups daily.

## 2022-03-02 ENCOUNTER — Other Ambulatory Visit: Payer: Self-pay

## 2022-03-02 DIAGNOSIS — N189 Chronic kidney disease, unspecified: Secondary | ICD-10-CM | POA: Insufficient documentation

## 2022-03-02 DIAGNOSIS — J449 Chronic obstructive pulmonary disease, unspecified: Secondary | ICD-10-CM | POA: Insufficient documentation

## 2022-03-02 DIAGNOSIS — K219 Gastro-esophageal reflux disease without esophagitis: Secondary | ICD-10-CM | POA: Insufficient documentation

## 2022-03-02 DIAGNOSIS — I219 Acute myocardial infarction, unspecified: Secondary | ICD-10-CM | POA: Insufficient documentation

## 2022-03-02 DIAGNOSIS — J189 Pneumonia, unspecified organism: Secondary | ICD-10-CM | POA: Insufficient documentation

## 2022-03-02 DIAGNOSIS — E1142 Type 2 diabetes mellitus with diabetic polyneuropathy: Secondary | ICD-10-CM | POA: Insufficient documentation

## 2022-03-02 DIAGNOSIS — M1990 Unspecified osteoarthritis, unspecified site: Secondary | ICD-10-CM | POA: Insufficient documentation

## 2022-03-03 ENCOUNTER — Ambulatory Visit: Payer: Medicare Other | Admitting: Cardiology

## 2022-03-03 DIAGNOSIS — G4733 Obstructive sleep apnea (adult) (pediatric): Secondary | ICD-10-CM | POA: Diagnosis not present

## 2022-03-06 ENCOUNTER — Other Ambulatory Visit: Payer: Self-pay

## 2022-03-06 ENCOUNTER — Ambulatory Visit: Payer: Medicare Other | Admitting: Cardiology

## 2022-03-06 DIAGNOSIS — R911 Solitary pulmonary nodule: Secondary | ICD-10-CM

## 2022-03-08 DIAGNOSIS — J449 Chronic obstructive pulmonary disease, unspecified: Secondary | ICD-10-CM | POA: Diagnosis not present

## 2022-03-10 ENCOUNTER — Other Ambulatory Visit: Payer: Self-pay

## 2022-03-10 MED ORDER — OXYCODONE-ACETAMINOPHEN 5-325 MG PO TABS: 1.0000 | ORAL_TABLET | Freq: Four times a day (QID) | ORAL | 0 refills | Status: DC | PRN

## 2022-03-12 ENCOUNTER — Ambulatory Visit: Payer: Medicare Other | Admitting: Podiatry

## 2022-03-12 ENCOUNTER — Encounter: Payer: Self-pay | Admitting: Cardiology

## 2022-03-12 ENCOUNTER — Ambulatory Visit: Payer: Medicare Other | Attending: Cardiology | Admitting: Cardiology

## 2022-03-12 ENCOUNTER — Other Ambulatory Visit: Payer: Self-pay | Admitting: Family Medicine

## 2022-03-12 ENCOUNTER — Other Ambulatory Visit: Payer: Self-pay | Admitting: Legal Medicine

## 2022-03-12 VITALS — BP 134/84 | HR 95 | Ht 68.0 in | Wt 302.8 lb

## 2022-03-12 DIAGNOSIS — G4733 Obstructive sleep apnea (adult) (pediatric): Secondary | ICD-10-CM | POA: Diagnosis not present

## 2022-03-12 DIAGNOSIS — Z6841 Body Mass Index (BMI) 40.0 and over, adult: Secondary | ICD-10-CM | POA: Diagnosis not present

## 2022-03-12 DIAGNOSIS — I2510 Atherosclerotic heart disease of native coronary artery without angina pectoris: Secondary | ICD-10-CM | POA: Diagnosis not present

## 2022-03-12 DIAGNOSIS — E1142 Type 2 diabetes mellitus with diabetic polyneuropathy: Secondary | ICD-10-CM | POA: Diagnosis not present

## 2022-03-12 DIAGNOSIS — E782 Mixed hyperlipidemia: Secondary | ICD-10-CM

## 2022-03-12 DIAGNOSIS — I10 Essential (primary) hypertension: Secondary | ICD-10-CM | POA: Diagnosis not present

## 2022-03-12 NOTE — Patient Instructions (Signed)

## 2022-03-12 NOTE — Progress Notes (Signed)
Cardiology Office Note:    Date:  03/12/2022   ID:  Brent Ortiz, DOB [birth year], MRN [id number]  PCP:  Brent Brome, MD  Cardiologist:  Brent Lindau, MD   Referring MD: Brent Brome, MD    ASSESSMENT:    1. Coronary artery disease involving native coronary artery of native heart without angina pectoris   2. Essential hypertension   3. Mixed hyperlipidemia   4. BMI 45.0-49.9, adult ([place])   5. Morbid obesity with BMI of 40.0-44.9, adult ([place])   6. Obstructive sleep apnea   7. Type 2 diabetes mellitus with diabetic polyneuropathy, without long-term current use of insulin (HCC)    PLAN:    In order of problems listed above:  Coronary artery disease and history of ischemic cardiomyopathy: Ejection fraction is now normal.  Secondary prevention stressed with the patient.  Importance of compliance with diet and medication stressed and he vocalized understanding. Essential hypertension: Blood pressure stable and diet was emphasized.  Lifestyle modification urged. Mixed dyslipidemia: On lipid-lowering medications.  Lipids are not at goal.  I emphasized to him diet.  This will be followed by primary care. Diabetes mellitus and morbid obesity: Lifestyle modification urged weight reduction stressed he promises to do better. History of cardiomyopathy: Ejection fraction has now normalized.  Patient is on appropriate medications.  Labs of 5 primary care. Patient will be seen in follow-up appointment in 12 months or earlier if the patient has any concerns    Medication Adjustments/Labs and Tests Ordered: Current medicines are reviewed at length with the patient today.  Concerns regarding medicines are outlined above.  Orders Placed This Encounter  Procedures   EKG 12-Lead   No orders of the defined types were placed in this encounter.    No chief complaint on file.    History of Present Illness:    Brent Ortiz is a 61 y.o. male.  Patient has past medical history of  coronary artery disease, essential hypertension, dyslipidemia, diabetes mellitus and morbid obesity.  He leads a sedentary lifestyle.  He is previously unknown to me.  He is seeing me for the first time.  He has seen my partner in the past.  He denies any chest pain orthopnea or PND.  I reviewed coronary angiography report from 2022 per the notes of my partner.  At the time of my evaluation, the patient is alert awake oriented and in no distress.  Past Medical History:  Diagnosis Date   Acute combined systolic and diastolic CHF, NYHA class 2 ([place]) 01/22/2020   Acute gout of left elbow 02/14/2022   Acute idiopathic gout of right wrist 12/29/2021   Alcohol abuse    Atherosclerosis of coronary artery of native heart with stable angina pectoris, unspecified vessel or lesion type ([place]) 01/23/2016   Overview:  Completely occluded circumflex artery proximal to obtuse marginal 1 basin cardiac catheter from 2017   Atherosclerosis of native coronary artery of native heart without angina pectoris 01/23/2016   Overview:  Completely occluded circumflex artery proximal to obtuse marginal 1 basin cardiac catheter from 2017   Bilateral hip pain 11/17/2021   BMI 45.0-49.9, adult ([place]) 01/13/2013   Cellulitis of right upper extremity 01/01/2022   Centrilobular emphysema ([place]) 01/16/2022   Chronic kidney disease    Chronic pain 10/19/2019   Sees pain clinic   COPD (chronic obstructive pulmonary disease) ([place])    COPD exacerbation ([place]) 10/03/2019   Coronary artery disease involving native heart 02/08/2020   Coronary atherosclerosis of  native coronary artery 01/23/2016   Overview:  Completely occluded circumflex artery proximal to obtuse marginal 1 basin cardiac catheter from 2017   Depression, major, single episode, moderate ([place]) 07/11/2020   Diabetic polyneuropathy ([place]) 12/06/2019   ED (erectile dysfunction) 12/06/2019   Essential hypertension 01/23/2016   Essential thrombocythemia ([place]) 09/29/2011   GERD  (gastroesophageal reflux disease) 11/17/2021   GERD without esophagitis    HFrEF (heart failure with reduced ejection fraction) ([place]) 02/08/2020   Knee pain, left 02/12/2012   Formatting of this note might be different from the original. Left   Leukocytosis 09/04/2011   Lumbar pain 11/17/2021   Mild major depression, single episode ([place]) 02/14/2022   Mixed hyperlipidemia 06/18/2017   Mixed incontinence 02/24/2022   Morbid obesity ([place]) 11/21/2020   Morbid obesity with BMI of 40.0-44.9, adult ([place]) 01/13/2013   Myocardial infarction ([place])    04-13-2011   Obstructive chronic bronchitis with exacerbation ([place]) 10/03/2019   Obstructive sleep apnea 07/18/2011   Olecranon bursitis of left elbow 02/14/2022   Opiate abuse, continuous (HCC)    Osteoarthritis    Pain in knee joint 02/12/2012   Formatting of this note might be different from the original. Left   Personal history of stroke with residual effects 07/24/2021   Pneumonia    Polysubstance abuse ([place])    Poor dentition    Stage 3b chronic kidney disease ([place]) 12/27/2020   Thrombocytosis 09/29/2011   Tobacco dependence 01/23/2016   Tobacco use disorder 01/23/2016   Type 2 diabetes mellitus with diabetic polyneuropathy [facility])     Past Surgical History:  Procedure Laterality Date   HERNIA REPAIR  2009   I & D KNEE WITH POLY EXCHANGE  09/03/2011   Procedure: IRRIGATION AND DEBRIDEMENT KNEE WITH POLY EXCHANGE;  Surgeon: Mauri Pole, MD;  Location: [facility];  Service: Orthopedics;  Laterality: Left;   TOOTH EXTRACTION Bilateral 08/06/2017   Procedure: DENTAL RESTORATION/EXTRACTIONS;  Surgeon: Diona Browner, DDS;  Location: [facility];  Service: Oral Surgery;  Laterality: Bilateral;   TOTAL KNEE ARTHROPLASTY   right  feb 2012   left march 2012 r   TOTAL KNEE REVISION  06/23/2011   Procedure: TOTAL KNEE REVISION;  Surgeon: Mauri Pole;  Location: [facility];  Service: Orthopedics;  Laterality: Left;  femomal nerve block done in holding area without  incident    Current Medications: Current Meds  Medication Sig   Accu-Chek Softclix Lancets lancets Use as instructed   albuterol (VENTOLIN HFA) 108 (90 Base) MCG/ACT inhaler INHALE 2 PUFFS INTO THE LUNGS EVERY 4 HOURS AS NEEDED FOR WHEEZING OR SHORTNESS OF BREATH.   allopurinol (ZYLOPRIM) 100 MG tablet TAKE 1 TABLET BY MOUTH ONCE DAILY (WHITE ROUND TABLET WITH 209)   amLODipine (NORVASC) 5 MG tablet Take 5 mg by mouth daily.   aspirin EC (GOODSENSE ASPIRIN LOW DOSE) 81 MG tablet TAKE 1 TABLET ($RemoveB'81MG'YLfVHJAb$ ) BY MOUTH DAILY. SWALLOW WHOLE.(YELLOW ROUND TAB WITH A HEART)   Blood Glucose Monitoring Suppl (ACCU-CHEK GUIDE) w/Device KIT 1 each by Does not apply route in the morning, at noon, and at bedtime.   clopidogrel (PLAVIX) 75 MG tablet TAKE 1 TABLET BY MOUTH ONCE DAILY (ROUND PINK TABLET WITH E 34)   colchicine 0.6 MG tablet TAKE 1 TABLET(0.6 MG) BY MOUTH TWICE DAILY FOR 7 DAYS THEN TAKE 1 TABLET(0.6 MG) BY MOUTH DAILY FOR 23 DAYS   ezetimibe (ZETIA) 10 MG tablet Take 10 mg by mouth daily.   famotidine (PEPCID) 20 MG tablet Take 20  mg by mouth 2 (two) times daily.   gabapentin (NEURONTIN) 300 MG capsule Take by mouth.   Glucagon, rDNA, (GLUCAGON EMERGENCY) 1 MG KIT Inject 1 mg into the muscle as needed (BG less than 70).   glucose blood (ACCU-CHEK GUIDE) test strip Use as instructed   isosorbide mononitrate (IMDUR) 60 MG 24 hr tablet Take 60 mg by mouth daily.   LANTUS SOLOSTAR 100 UNIT/ML Solostar Pen ADMINISTER 30 UNITS UNDER THE SKIN DAILY   metFORMIN (GLUCOPHAGE) 500 MG tablet TAKE 1 TABLET BY MOUTH EVERY DAY WITH SUPPER.(ROUND WHITE TAB WITH H 102)   metoprolol succinate (TOPROL-XL) 25 MG 24 hr tablet TAKE 1 TABLET (25 MG TOTAL) BY MOUTH DAILY.(OVAL WHITE TAB WITH 564)   nitroGLYCERIN (NITROSTAT) 0.4 MG SL tablet DISSOLVE 1 TABLET UNDER TONGUE EVERY 5 MINUTES AS NEEDED CHEST PAIN.   oxyCODONE-acetaminophen (PERCOCET/ROXICET) 5-325 MG tablet Take 1 tablet by mouth every 6 (six) hours as needed  for severe pain.   OZEMPIC, 0.25 OR 0.5 MG/DOSE, 2 MG/3ML SOPN Inject into the skin.   pantoprazole (PROTONIX) 40 MG tablet TAKE ONE TABLET BY MOUTH TWO TIMES DAILY. (OVAL YELLOW TABLET WITH H126)   polyethylene glycol (MIRALAX / GLYCOLAX) 17 g packet Take 17 g by mouth daily.   ranolazine (RANEXA) 500 MG 12 hr tablet TAKE 1 TABLET BY MOUTH 2 TIMES DAILY (PEACH COLORED TABLET WITH C49 OR 588)   rosuvastatin (CRESTOR) 40 MG tablet Take 1 tablet (40 mg total) by mouth daily.   sacubitril-valsartan (ENTRESTO) 24-26 MG TAKE 1 TABLET BY MOUTH 2 TIMES DAILY.(OBLONG PINK TAB WITH LZ NVR)   sertraline (ZOLOFT) 25 MG tablet Take 25 mg by mouth daily.   spironolactone (ALDACTONE) 25 MG tablet TAKE 1/2 TABLET(12.5) BY MOUTH DAILY. (HALF TAN TAB). SCHEDULE OFFICE VISIT FOR FUTURE REFILLS.   tamsulosin (FLOMAX) 0.4 MG CAPS capsule TAKE 1 CAPSULE (.$RemoveBef'4MG'TBeqoRmdjO$  TOTAL) BY MOUTH DAILY.(TAN AND GOLD CAPSULE WITH CL23 0.4)   traZODone (DESYREL) 150 MG tablet TAKE 1 TABLET($RemoveBefor'150MG'xteYTILhcfds$ ) BY MOUTH AT BEDTIME(OVAL WHITE TAB WITH 13 32 505050)   TRELEGY ELLIPTA 100-62.5-25 MCG/ACT AEPB Take 1 puff by mouth daily.   ULTICARE SHORT PEN NEEDLES 31G X 8 MM MISC USE AS DIRECTED (ONCE DAILY WITH LANTUS INJECTION AS INSTRUCTED)   Current Facility-Administered Medications for the 03/12/22 encounter (Office Visit) with Sevan Mcbroom, [name], MD  Medication   triamcinolone acetonide (KENALOG-40) injection 20 mg     Allergies:   Motrin [ibuprofen] and Other   Social History   Socioeconomic History   Marital status: Widowed    Spouse name: Not on file   Number of children: 5   Years of education: Not on file   Highest education level: Not on file  Occupational History   Occupation: Disabled  Tobacco Use   Smoking status: Every Day    Packs/day: 0.50    Years: 50.00    Total pack years: 25.00    Types: Cigarettes   Smokeless tobacco: Never  Vaping Use   Vaping Use: Former  Substance and Sexual Activity   Alcohol use: Yes     Comment: heavy drinker in past. occasionally has a beer. Not on regular basis.   Drug use: Not Currently    Types: Cocaine   Sexual activity: Not Currently  Other Topics Concern   Not on file  Social History Narrative   Not on file   Social Determinants of Health   Financial Resource Strain: Low Risk  (02/10/2022)   Overall Financial Resource Strain (  CARDIA)    Difficulty of Paying Living Expenses: Not hard at all  Food Insecurity: No Food Insecurity (02/10/2022)   Hunger Vital Sign    Worried About Running Out of Food in the Last Year: Never true    Ran Out of Food in the Last Year: Never true  Transportation Needs: No Transportation Needs (02/10/2022)   PRAPARE - Hydrologist (Medical): No    Lack of Transportation (Non-Medical): No  Physical Activity: Inactive (02/10/2022)   Exercise Vital Sign    Days of Exercise per Week: 0 days    Minutes of Exercise per Session: 0 min  Stress: No Stress Concern Present (02/10/2022)   [place]    Feeling of Stress : Only a little  Social Connections: Socially Isolated (02/10/2022)   Social Connection and Isolation Panel [NHANES]    Frequency of Communication with Friends and Family: More than three times a week    Frequency of Social Gatherings with Friends and Family: Three times a week    Attends Religious Services: Never    Active Member of Clubs or Organizations: No    Attends Archivist Meetings: Never    Marital Status: Widowed     Family History: The patient's family history includes Colon cancer in his mother and another family member.  ROS:   Please see the history of present illness.    All other systems reviewed and are negative.  EKGs/Labs/Other Studies Reviewed:    The following studies were reviewed today: I discussed my findings with the patient at length   Recent Labs: 01/09/2022: ALT 10; BUN 13; Creatinine, Ser 1.08;  Hemoglobin 13.8; Platelets 489; Potassium 4.6; Sodium 138  Recent Lipid Panel    Component Value Date/Time   CHOL 193 12/29/2021 1042   TRIG 163 (H) 12/29/2021 1042   HDL 40 12/29/2021 1042   CHOLHDL 4.8 12/29/2021 1042   LDLCALC 124 (H) 12/29/2021 1042    Physical Exam:    VS:  BP 134/84   Pulse 95   Ht $R'5\' 8"'GM$  (1.727 m)   Wt (!) 302 lb 12.8 oz (137.3 kg)   SpO2 93%   BMI 46.04 kg/m     Wt Readings from Last 3 Encounters:  03/12/22 (!) 302 lb 12.8 oz (137.3 kg)  02/10/22 295 lb (133.8 kg)  02/09/22 295 lb (133.8 kg)     GEN: Patient is in no acute distress HEENT: Normal NECK: No JVD; No carotid bruits LYMPHATICS: No lymphadenopathy CARDIAC: Hear sounds regular, 2/6 systolic murmur at the apex. RESPIRATORY:  Clear to auscultation without rales, wheezing or rhonchi  ABDOMEN: Soft, non-tender, non-distended MUSCULOSKELETAL:  No edema; No deformity  SKIN: Warm and dry NEUROLOGIC:  Alert and oriented x 3 PSYCHIATRIC:  Normal affect   Signed, Brent Lindau, MD  03/12/2022 11:56 AM    [place]

## 2022-03-18 ENCOUNTER — Encounter: Payer: Self-pay | Admitting: Nurse Practitioner

## 2022-03-18 ENCOUNTER — Telehealth (INDEPENDENT_AMBULATORY_CARE_PROVIDER_SITE_OTHER): Payer: Medicare Other | Admitting: Nurse Practitioner

## 2022-03-18 DIAGNOSIS — Z20822 Contact with and (suspected) exposure to covid-19: Secondary | ICD-10-CM | POA: Diagnosis not present

## 2022-03-18 LAB — POC COVID19 BINAXNOW: SARS Coronavirus 2 Ag: NEGATIVE

## 2022-03-18 MED ORDER — NIRMATRELVIR/RITONAVIR (PAXLOVID)TABLET: 3.0000 | ORAL_TABLET | Freq: Two times a day (BID) | ORAL | 0 refills | Status: AC

## 2022-03-18 NOTE — Progress Notes (Signed)
 Virtual Visit via Video Note   This visit type was conducted due to national recommendations for restrictions regarding the COVID-19 Pandemic (e.g. social distancing) in an effort to limit this patient's exposure and mitigate transmission in our community.  Due to his co-morbid illnesses, this patient is at least at moderate risk for complications without adequate follow up.  This format is felt to be most appropriate for this patient at this time.  All issues noted in this document were discussed and addressed.  A limited physical exam was performed with this format.  A verbal consent was obtained for the virtual visit.   Date:  03/18/2022   ID:  Brent Ortiz, DOB 04/18/1961, MRN [id number]  Patient Location: Other:  [name] Provider Location: Office/Clinic  PCP:  [name], Kirsten, MD   Evaluation Performed:  Established patient, acute telemedicine visit  Chief Complaint:  Exposure to COVID-19  History of Present Illness:    Brent Ortiz is a 61 y.o. male with close exposure to COIVD-19. States he is not currently symptomatic. Reports he is concerned due to COPD diagnosis and other comorbid health conditions.  The patient does not have symptoms concerning for COVID-19 infection (fever, chills, cough, or new shortness of breath).    Past Medical History:  Diagnosis Date   Acute combined systolic and diastolic CHF, NYHA class 2 (HCC) 01/22/2020   Acute gout of left elbow 02/14/2022   Acute idiopathic gout of right wrist 12/29/2021   Alcohol abuse    Atherosclerosis of coronary artery of native heart with stable angina pectoris, unspecified vessel or lesion type (HCC) 01/23/2016   Overview:  Completely occluded circumflex artery proximal to obtuse marginal 1 basin cardiac catheter from 2017   Atherosclerosis of native coronary artery of native heart without angina pectoris 01/23/2016   Overview:  Completely occluded circumflex artery proximal to obtuse marginal 1 basin  cardiac catheter from 2017   Bilateral hip pain 11/17/2021   BMI 45.0-49.9, adult (HCC) 01/13/2013   Cellulitis of right upper extremity 01/01/2022   Centrilobular emphysema (HCC) 01/16/2022   Chronic kidney disease    Chronic pain 10/19/2019   Sees pain clinic   COPD (chronic obstructive pulmonary disease) (HCC)    COPD exacerbation (HCC) 10/03/2019   Coronary artery disease involving native heart 02/08/2020   Coronary atherosclerosis of native coronary artery 01/23/2016   Overview:  Completely occluded circumflex artery proximal to obtuse marginal 1 basin cardiac catheter from 2017   Depression, major, single episode, moderate (HCC) 07/11/2020   Diabetic polyneuropathy (HCC) 12/06/2019   ED (erectile dysfunction) 12/06/2019   Essential hypertension 01/23/2016   Essential thrombocythemia (HCC) 09/29/2011   GERD (gastroesophageal reflux disease) 11/17/2021   GERD without esophagitis    HFrEF (heart failure with reduced ejection fraction) (HCC) 02/08/2020   Knee pain, left 02/12/2012   Formatting of this note might be different from the original. Left   Leukocytosis 09/04/2011   Lumbar pain 11/17/2021   Mild major depression, single episode (HCC) 02/14/2022   Mixed hyperlipidemia 06/18/2017   Mixed incontinence 02/24/2022   Morbid obesity (HCC) 11/21/2020   Morbid obesity with BMI of 40.0-44.9, adult (HCC) 01/13/2013   Myocardial infarction (HCC)    04-13-2011   Obstructive chronic bronchitis with exacerbation (HCC) 10/03/2019   Obstructive sleep apnea 07/18/2011   Olecranon bursitis of left elbow 02/14/2022   Opiate abuse, continuous (HCC)    Osteoarthritis    Pain in knee joint 02/12/2012   Formatting of this note   might be different from the original. Left   Personal history of stroke with residual effects 07/24/2021   Pneumonia    Polysubstance abuse ([place])    Poor dentition    Stage 3b chronic kidney disease ([place]) 12/27/2020   Thrombocytosis 09/29/2011   Tobacco dependence 01/23/2016    Tobacco use disorder 01/23/2016   Type 2 diabetes mellitus with diabetic polyneuropathy [facility])     Past Surgical History:  Procedure Laterality Date   HERNIA REPAIR  2009   I & D KNEE WITH POLY EXCHANGE  09/03/2011   Procedure: IRRIGATION AND DEBRIDEMENT KNEE WITH POLY EXCHANGE;  Surgeon: Mauri Pole, MD;  Location: [facility];  Service: Orthopedics;  Laterality: Left;   TOOTH EXTRACTION Bilateral 08/06/2017   Procedure: DENTAL RESTORATION/EXTRACTIONS;  Surgeon: Diona Browner, DDS;  Location: [place];  Service: Oral Surgery;  Laterality: Bilateral;   TOTAL KNEE ARTHROPLASTY   right  feb 2012   left march 2012 r   TOTAL KNEE REVISION  06/23/2011   Procedure: TOTAL KNEE REVISION;  Surgeon: Mauri Pole;  Location: [facility];  Service: Orthopedics;  Laterality: Left;  femomal nerve block done in holding area without incident    Family History  Problem Relation Age of Onset   Colon cancer Other    Colon cancer Mother     Social History   Socioeconomic History   Marital status: Widowed    Spouse name: Not on file   Number of children: 5   Years of education: Not on file   Highest education level: Not on file  Occupational History   Occupation: Disabled  Tobacco Use   Smoking status: Every Day    Packs/day: 0.50    Years: 50.00    Total pack years: 25.00    Types: Cigarettes   Smokeless tobacco: Never  Vaping Use   Vaping Use: Former  Substance and Sexual Activity   Alcohol use: Yes    Comment: heavy drinker in past. occasionally has a beer. Not on regular basis.   Drug use: Not Currently    Types: Cocaine   Sexual activity: Not Currently  Other Topics Concern   Not on file  Social History Narrative   Not on file   Social Determinants of Health   Financial Resource Strain: Low Risk  (02/10/2022)   Overall Financial Resource Strain (CARDIA)    Difficulty of Paying Living Expenses: Not hard at all  Food Insecurity: No Food Insecurity (02/10/2022)   Hunger Vital Sign     Worried About Running Out of Food in the Last Year: Never true    Ran Out of Food in the Last Year: Never true  Transportation Needs: No Transportation Needs (02/10/2022)   PRAPARE - Hydrologist (Medical): No    Lack of Transportation (Non-Medical): No  Physical Activity: Inactive (02/10/2022)   Exercise Vital Sign    Days of Exercise per Week: 0 days    Minutes of Exercise per Session: 0 min  Stress: No Stress Concern Present (02/10/2022)   [place]    Feeling of Stress : Only a little  Social Connections: Socially Isolated (02/10/2022)   Social Connection and Isolation Panel [NHANES]    Frequency of Communication with Friends and Family: More than three times a week    Frequency of Social Gatherings with Friends and Family: Three times a week    Attends Religious Services: Never    Active Member  of Clubs or Organizations: No    Attends Archivist Meetings: Never    Marital Status: Widowed  Intimate Partner Violence: Not At Risk (02/10/2022)   Humiliation, Afraid, Rape, and Kick questionnaire    Fear of Current or Ex-Partner: No    Emotionally Abused: No    Physically Abused: No    Sexually Abused: No    Outpatient Medications Prior to Visit  Medication Sig Dispense Refill   Accu-Chek Softclix Lancets lancets Use as instructed 100 each 5   albuterol (VENTOLIN HFA) 108 (90 Base) MCG/ACT inhaler INHALE 2 PUFFS INTO THE LUNGS EVERY 4 HOURS AS NEEDED FOR WHEEZING OR SHORTNESS OF BREATH. 8.5 g 6   allopurinol (ZYLOPRIM) 100 MG tablet TAKE 1 TABLET BY MOUTH ONCE DAILY (WHITE ROUND TABLET WITH 209) 31 tablet 6   amLODipine (NORVASC) 5 MG tablet Take 5 mg by mouth daily.     aspirin EC (GOODSENSE ASPIRIN LOW DOSE) 81 MG tablet TAKE 1 TABLET (81MG) BY MOUTH DAILY. SWALLOW WHOLE.(YELLOW ROUND TAB WITH A HEART) 93 tablet 0   Blood Glucose Monitoring Suppl (ACCU-CHEK GUIDE) w/Device KIT 1 each by  Does not apply route in the morning, at noon, and at bedtime. 1 kit 0   clopidogrel (PLAVIX) 75 MG tablet TAKE 1 TABLET BY MOUTH ONCE DAILY (ROUND PINK TABLET WITH E 34) 31 tablet 6   colchicine 0.6 MG tablet TAKE 1 TABLET(0.6 MG) BY MOUTH TWICE DAILY FOR 7 DAYS THEN TAKE 1 TABLET(0.6 MG) BY MOUTH DAILY FOR 23 DAYS 180 tablet 0   ezetimibe (ZETIA) 10 MG tablet Take 10 mg by mouth daily.     famotidine (PEPCID) 20 MG tablet Take 20 mg by mouth 2 (two) times daily.     gabapentin (NEURONTIN) 300 MG capsule Take by mouth.     Glucagon, rDNA, (GLUCAGON EMERGENCY) 1 MG KIT Inject 1 mg into the muscle as needed (BG less than 70).     glucose blood (ACCU-CHEK GUIDE) test strip Use as instructed 100 each 5   isosorbide mononitrate (IMDUR) 60 MG 24 hr tablet Take 60 mg by mouth daily.     LANTUS SOLOSTAR 100 UNIT/ML Solostar Pen ADMINISTER 30 UNITS UNDER THE SKIN DAILY 15 mL 3   metFORMIN (GLUCOPHAGE) 500 MG tablet TAKE 1 TABLET BY MOUTH EVERY DAY WITH SUPPER.(ROUND WHITE TAB WITH G10, or LL) 93 tablet 2   metoprolol succinate (TOPROL-XL) 25 MG 24 hr tablet TAKE 1 TABLET (25 MG TOTAL) BY MOUTH DAILY.(OVAL WHITE TAB WITH 564) 31 tablet 3   nitroGLYCERIN (NITROSTAT) 0.4 MG SL tablet DISSOLVE 1 TABLET UNDER TONGUE EVERY 5 MINUTES AS NEEDED CHEST PAIN. 25 tablet 1   oxyCODONE-acetaminophen (PERCOCET/ROXICET) 5-325 MG tablet Take 1 tablet by mouth every 6 (six) hours as needed for severe pain. 120 tablet 0   OZEMPIC, 0.25 OR 0.5 MG/DOSE, 2 MG/3ML SOPN Inject into the skin.     pantoprazole (PROTONIX) 40 MG tablet TAKE ONE TABLET BY MOUTH TWO TIMES DAILY. (OVAL YELLOW TABLET WITH H126) 62 tablet 3   polyethylene glycol (MIRALAX / GLYCOLAX) 17 g packet Take 17 g by mouth daily.     ranolazine (RANEXA) 500 MG 12 hr tablet TAKE 1 TABLET BY MOUTH 2 TIMES DAILY (PEACH COLORED TABLET WITH C49 OR 588) 186 tablet 0   rosuvastatin (CRESTOR) 40 MG tablet Take 1 tablet (40 mg total) by mouth daily. 90 tablet 2    sacubitril-valsartan (ENTRESTO) 24-26 MG TAKE 1 TABLET BY MOUTH 2 TIMES DAILY.(OBLONG  PINK TAB WITH LZ NVR) 186 tablet 0   sertraline (ZOLOFT) 25 MG tablet Take 25 mg by mouth daily.     spironolactone (ALDACTONE) 25 MG tablet TAKE 1/2 TABLET(12.5) BY MOUTH DAILY. (HALF TAN TAB). SCHEDULE OFFICE VISIT FOR FUTURE REFILLS. 15 tablet 0   tamsulosin (FLOMAX) 0.4 MG CAPS capsule TAKE 1 CAPSULE (.4MG TOTAL) BY MOUTH DAILY.(TAN AND GOLD CAPSULE WITH CL23 0.4) 93 capsule 3   traZODone (DESYREL) 150 MG tablet TAKE 1 TABLET(150MG) BY MOUTH AT BEDTIME(OVAL WHITE TAB WITH 13 32 505050) 93 tablet 1   TRELEGY ELLIPTA 100-62.5-25 MCG/ACT AEPB INHALE 1 PUFF ONCE DAILY 60 each 2   ULTICARE SHORT PEN NEEDLES 31G X 8 MM MISC USE AS DIRECTED (ONCE DAILY WITH LANTUS INJECTION AS INSTRUCTED) 100 each 4   Facility-Administered Medications Prior to Visit  Medication Dose Route Frequency Provider Last Rate Last Admin   triamcinolone acetonide (KENALOG-40) injection 20 mg  20 mg Intra-articular Once [name], Kirsten, MD        Allergies:   Motrin [ibuprofen] and Other   Social History   Tobacco Use   Smoking status: Every Day    Packs/day: 0.50    Years: 50.00    Total pack years: 25.00    Types: Cigarettes   Smokeless tobacco: Never  Vaping Use   Vaping Use: Former  Substance Use Topics   Alcohol use: Yes    Comment: heavy drinker in past. occasionally has a beer. Not on regular basis.   Drug use: Not Currently    Types: Cocaine     ROS  See pertinent positives and negatives per HPI.  Labs/Other Tests and Data Reviewed:    Recent Labs: 01/09/2022: ALT 10; BUN 13; Creatinine, Ser 1.08; Hemoglobin 13.8; Platelets 489; Potassium 4.6; Sodium 138   Recent Lipid Panel Lab Results  Component Value Date/Time   CHOL 193 12/29/2021 10:42 AM   TRIG 163 (H) 12/29/2021 10:42 AM   HDL 40 12/29/2021 10:42 AM   CHOLHDL 4.8 12/29/2021 10:42 AM   LDLCALC 124 (H) 12/29/2021 10:42 AM    Wt Readings from Last 3  Encounters:  03/12/22 (!) 302 lb 12.8 oz (137.3 kg)  02/10/22 295 lb (133.8 kg)  02/09/22 295 lb (133.8 kg)     Objective:    Vital Signs:  There were no vitals taken for this visit.   Physical Exam no physical exam due to telemedicine  ASSESSMENT & PLAN:    1. Exposure to confirmed case of COVID-19 - POC COVID-19 BinaxNow - nirmatrelvir/ritonavir EUA (PAXLOVID) 20 x 150 MG & 10 x 100MG TABS; Take 3 tablets by mouth 2 (two) times daily for 5 days. (Take nirmatrelvir 150 mg two tablets twice daily for 5 days and ritonavir 100 mg one tablet twice daily for 5 days) Patient GFR is 77  Dispense: 30 tablet; Refill: 0   .   Rest and push fluids If symptoms develop, treat with OTC medications Seek emergency medical care for any severe or concerning symptoms    COVID-19 Education: The signs and symptoms of COVID-19 were discussed with the patient and how to seek care for testing (follow up with PCP or arrange E-visit). The importance of social distancing was discussed today.   I spent 7 minutes dedicated to the care of this patient on the date of this encounter to include face-to-face time with the patient, as well as: EMR review and prescription medication management.   Follow Up:  In Person prn  Signed, Shannon   J Heaton, NP  03/18/2022 10:09 AM    [name]   

## 2022-03-19 ENCOUNTER — Ambulatory Visit (INDEPENDENT_AMBULATORY_CARE_PROVIDER_SITE_OTHER): Payer: Medicare Other | Admitting: Podiatry

## 2022-03-19 ENCOUNTER — Encounter: Payer: Self-pay | Admitting: Podiatry

## 2022-03-19 DIAGNOSIS — E1142 Type 2 diabetes mellitus with diabetic polyneuropathy: Secondary | ICD-10-CM | POA: Diagnosis not present

## 2022-03-19 DIAGNOSIS — B351 Tinea unguium: Secondary | ICD-10-CM | POA: Diagnosis not present

## 2022-03-19 DIAGNOSIS — L853 Xerosis cutis: Secondary | ICD-10-CM | POA: Diagnosis not present

## 2022-03-19 DIAGNOSIS — M79609 Pain in unspecified limb: Secondary | ICD-10-CM

## 2022-03-19 MED ORDER — AMMONIUM LACTATE 12 % EX LOTN: 1.0000 | TOPICAL_LOTION | CUTANEOUS | 5 refills | Status: DC | PRN

## 2022-03-21 DIAGNOSIS — G4733 Obstructive sleep apnea (adult) (pediatric): Secondary | ICD-10-CM | POA: Diagnosis not present

## 2022-03-23 NOTE — Progress Notes (Signed)
  Subjective:  Patient ID: Brent Ortiz, male    DOB: February 25, 1961,  MRN: [id number]  Brent Ortiz presents to clinic today for at risk foot care with history of diabetic neuropathy and painful thick toenails that are difficult to trim. Pain interferes with ambulation. Aggravating factors include wearing enclosed shoe gear. Pain is relieved with periodic professional debridement.  Patient states blood glucose was 143 mg/dl yesterday. Last known HgA1c was unknown.  New problem(s): None.   PCP is [name], [name], MD , and last visit was August, 2023.  Allergies  Allergen Reactions   Motrin [Ibuprofen] Other (See Comments)    Reaction: ulcers   Other Nausea And Vomiting    Patient has ulcers    Review of Systems: Negative except as noted in the HPI.  Objective: No changes noted in today's physical examination. Brent Ortiz is a pleasant 61 y.o. male in NAD. AAO x 3.  Vascular Examination: CFT <3 seconds b/l LE. Faintly palpable DP pulses b/l LE. Faintly palpable PT pulse(s) b/l LE. Pedal hair sparse. No pain with calf compression b/l. Lower extremity skin temperature gradient within normal limits. Trace edema b/l. Chronic venous insuffiency noted b/l. No ischemia or gangrene noted b/l LE. No cyanosis or clubbing noted b/l LE.  Neurological Examination: Sensation grossly intact b/l with 10 gram monofilament. Vibratory sensation intact b/l. Protective sensation intact 5/5 intact bilaterally with 10g monofilament b/l. Vibratory sensation diminished b/l. Babinski reflex negative b/l.  Dermatological Examination: No open wounds b/l LE. No interdigital macerations noted b/l LE. Pedal skin noted to be dry b/l lower extremities.Toenails 1-5 b/l thick, discolored, elongated with subungual debris and pain on dorsal palpation. No hyperkeratotic lesions noted b/l.   Musculoskeletal Examination: Muscle strength 5/5 to b/l LE. Muscle strength 5/5 to all lower extremity muscle groups  bilaterally. Pes planus deformity noted bilateral LE. Utilizes cane for ambulation assistance.  Radiographs: None  Assessment/Plan: 1. Pain due to onychomycosis of nail   2. Xerosis cutis   3. Diabetic polyneuropathy associated with type 2 diabetes mellitus ([place])    -Examined patient. -Recommended once weekly foot soaks for pedal hygiene. Dry well between toes. Apply moisturizing lotion as prescribed. -Continue foot and shoe inspections daily. Monitor blood glucose per PCP/Endocrinologist's recommendations. -Mycotic toenails 1-5 bilaterally were debrided in length and girth with sterile nail nippers and dremel without incident. -For xerosis, Rx sent for Ammonium Lactate Lotion 12%. Apply to feet twice daily avoiding application between toes. -Patient/POA to call should there be question/concern in the interim.   Return in about 3 months (around 06/18/2022).  [name], DPM

## 2022-03-25 ENCOUNTER — Telehealth: Payer: Self-pay

## 2022-03-25 NOTE — Patient Outreach (Signed)
  Care Coordination   03/25/2022 Name: Brent Ortiz MRN: [id number] DOB: 06/14/1961   Care Coordination Outreach Attempts:  An unsuccessful telephone outreach was attempted today to offer the patient information about available care coordination services as a benefit of their health plan.   Follow Up Plan:  Additional outreach attempts will be made to offer the patient care coordination information and services.   Encounter Outcome:  No Answer  Care Coordination Interventions Activated:  No   Care Coordination Interventions:  No, not indicated    Tomasa Rand, RN, BSN, CEN [facility] [employer] [phone]

## 2022-03-27 ENCOUNTER — Telehealth: Payer: Self-pay | Admitting: Cardiology

## 2022-03-27 DIAGNOSIS — N401 Enlarged prostate with lower urinary tract symptoms: Secondary | ICD-10-CM

## 2022-03-27 MED ORDER — SPIRONOLACTONE 25 MG PO TABS: ORAL_TABLET | ORAL | 3 refills | Status: DC

## 2022-03-27 NOTE — Telephone Encounter (Signed)
*  STAT* If patient is at the pharmacy, call can be transferred to refill team.   1. Which medications need to be refilled? (please list name of each medication and dose if known) spironolactone (ALDACTONE) 25 MG tablet  2. Which pharmacy/location (including street and city if local pharmacy) is medication to be sent to? [place], [place]  3. Do they need a 30 day or 90 day supply? [place]

## 2022-03-30 ENCOUNTER — Encounter: Payer: Self-pay | Admitting: Family Medicine

## 2022-03-30 ENCOUNTER — Ambulatory Visit (INDEPENDENT_AMBULATORY_CARE_PROVIDER_SITE_OTHER): Payer: Medicare Other | Admitting: Family Medicine

## 2022-03-30 VITALS — BP 136/86 | HR 88 | Temp 97.0°F | Resp 18 | Ht 68.0 in | Wt 301.0 lb

## 2022-03-30 DIAGNOSIS — G894 Chronic pain syndrome: Secondary | ICD-10-CM | POA: Diagnosis not present

## 2022-03-30 DIAGNOSIS — E1142 Type 2 diabetes mellitus with diabetic polyneuropathy: Secondary | ICD-10-CM | POA: Diagnosis not present

## 2022-03-30 DIAGNOSIS — I5041 Acute combined systolic (congestive) and diastolic (congestive) heart failure: Secondary | ICD-10-CM

## 2022-03-30 DIAGNOSIS — Z23 Encounter for immunization: Secondary | ICD-10-CM | POA: Diagnosis not present

## 2022-03-30 DIAGNOSIS — I2510 Atherosclerotic heart disease of native coronary artery without angina pectoris: Secondary | ICD-10-CM | POA: Diagnosis not present

## 2022-03-30 DIAGNOSIS — F1120 Opioid dependence, uncomplicated: Secondary | ICD-10-CM

## 2022-03-30 HISTORY — DX: Opioid dependence, uncomplicated: F11.20

## 2022-03-30 NOTE — Assessment & Plan Note (Signed)
Control: labs pending  Recommend check sugars fasting daily. Recommend check feet daily. Recommend annual eye exams. Medicines: await a1c to make recommendations.  Continue to work on eating a healthy diet and exercise.  Labs drawn today.

## 2022-03-30 NOTE — Assessment & Plan Note (Signed)
Continue metoprolol xl 25 mg daily, isosorbide 60 mg daily, rosuvastatin 40 mg daily and zetia 10 mg daily.

## 2022-03-30 NOTE — Patient Instructions (Addendum)
FOR DIABETES: CALL FOR EYE APPOINTMENT IN [employer].   FOR DEPRESSION: STOP SERTRALINE (ZOLOFT)  FOR HEARTBURN: DECREASE PANTOPRAZOLE (PROTONIX) TO ONCE A DAY FOR THE NEXT 2-4 WEEKS. IF NO HEARTBURN COMING THROUGH STOP PANTOPRAZOLE ALL TOGETHER. CONTINUE FAMOTIDINE 20 MG TWICE DAILY   PAIN CONTRACT SIGNED.Marland Kitchen

## 2022-03-30 NOTE — Assessment & Plan Note (Signed)
Continue rosuvastatin, zetia, plavix, and aspirin 81 mg daily.   

## 2022-03-30 NOTE — Assessment & Plan Note (Signed)
Pain contract signed.  UDS done.  Reviewed the contract in depth.

## 2022-03-30 NOTE — Assessment & Plan Note (Signed)
The current medical regimen is effective;  continue present plan and medications. Continue Entresto 24-26 mg twice a day, Isosorbide 60 mg daily, Spironolactone 25 mg daily, Ranexa 500 mg twice daily, metoprolol XL 25 mg daily.  Management per specialist.  Cardiologist: Dr. Geraldo Pitter.

## 2022-03-30 NOTE — Assessment & Plan Note (Signed)
UDS

## 2022-03-30 NOTE — Progress Notes (Signed)
Subjective:  Patient ID: Brent Ortiz, male    DOB: 02/25/1961  Age: 61 y.o. MRN: [id number]  Chief Complaint  Patient presents with   Diabetes   Chronic Kidney Disease    HPI  Type 2, insulin requiring diabetes, complicated by hyperlipidemia, hypertension, polyneuropathy.   Checks sugars once every 2 weeks.  Current medicines for diabetes: Lantus 30 units daily, metformin 500 mg daily. Patient stopped ozempic 0.5 mg weekly, because he did not feel like it was helping, but he is not checking sugars. Stopped it in July. Patient performs foot exams daily and last ophthalmologic exam was 11/12/2020.  Last A1C was 6.4.   Hyperlipidemia.  Rosuvastatin 40 mg daily, Zetia 10 mg daily.  Hypertension.  On Spironolactone 25 mg  daily, metoprolol 25 mg twice a day, amlodipine 5 mg daily..  Patient is working on maintaining diet and exercise regimen and follows up as directed.   CHF: He takes Entresto 24-26 mg twice a day, Isosorbide 60 mg daily, Spironolactone 25 mg daily, Ranexa 500 mg twice daily, metoprolol XL 25 mg daily. Cardiologist  Gout:   Takes colchicine-probenecid 05/500 mg one oral twice daily as needed for flares. Icy hot rollon. On Allopurinol 100 mg daily.  Constipation: on miralax 17 m daily.  GERD: pepcid 20 mg twice daily. Protonix 40 mg one bi.  Insomnia: on trazodone 150 mg before bed.  Depression: on zoloft 25 mg daily.  COPD: on trelegy one inhalation daily. Albuterol inhaler 2 puffs 1-2 times per day.    BACK PAIN/HIP PAIN: ON PERCOCET 5/325 MG FOUR TIMES A DAY AS NEEDED.   Current Outpatient Medications on File Prior to Visit  Medication Sig Dispense Refill   Accu-Chek Softclix Lancets lancets Use as instructed 100 each 5   albuterol (VENTOLIN HFA) 108 (90 Base) MCG/ACT inhaler INHALE 2 PUFFS INTO THE LUNGS EVERY 4 HOURS AS NEEDED FOR WHEEZING OR SHORTNESS OF BREATH. 8.5 g 6   allopurinol (ZYLOPRIM) 100 MG tablet TAKE 1 TABLET BY MOUTH ONCE DAILY (WHITE ROUND  TABLET WITH 209) 31 tablet 6   amLODipine (NORVASC) 5 MG tablet Take 5 mg by mouth daily.     ammonium lactate (LAC-HYDRIN) 12 % lotion Apply 1 Application topically as needed for dry skin. 400 g 5   aspirin EC (GOODSENSE ASPIRIN LOW DOSE) 81 MG tablet TAKE 1 TABLET ($RemoveB'81MG'JTSZlbbS$ ) BY MOUTH DAILY. SWALLOW WHOLE.(YELLOW ROUND TAB WITH A HEART) 93 tablet 0   Blood Glucose Monitoring Suppl (ACCU-CHEK GUIDE) w/Device KIT 1 each by Does not apply route in the morning, at noon, and at bedtime. 1 kit 0   clopidogrel (PLAVIX) 75 MG tablet TAKE 1 TABLET BY MOUTH ONCE DAILY (ROUND PINK TABLET WITH E 34) 31 tablet 6   colchicine 0.6 MG tablet TAKE 1 TABLET(0.6 MG) BY MOUTH TWICE DAILY FOR 7 DAYS THEN TAKE 1 TABLET(0.6 MG) BY MOUTH DAILY FOR 23 DAYS 180 tablet 0   ezetimibe (ZETIA) 10 MG tablet Take 10 mg by mouth daily.     famotidine (PEPCID) 20 MG tablet Take 20 mg by mouth 2 (two) times daily.     gabapentin (NEURONTIN) 300 MG capsule Take by mouth.     Glucagon, rDNA, (GLUCAGON EMERGENCY) 1 MG KIT Inject 1 mg into the muscle as needed (BG less than 70).     glucose blood (ACCU-CHEK GUIDE) test strip Use as instructed 100 each 5   isosorbide mononitrate (IMDUR) 60 MG 24 hr tablet Take 60 mg by mouth daily.  LANTUS SOLOSTAR 100 UNIT/ML Solostar Pen ADMINISTER 30 UNITS UNDER THE SKIN DAILY 15 mL 3   metFORMIN (GLUCOPHAGE) 500 MG tablet TAKE 1 TABLET BY MOUTH EVERY DAY WITH SUPPER.(ROUND WHITE TAB WITH G10, or LL) 93 tablet 2   metoprolol succinate (TOPROL-XL) 25 MG 24 hr tablet TAKE 1 TABLET (25 MG TOTAL) BY MOUTH DAILY.(OVAL WHITE TAB WITH 564) 31 tablet 3   nitroGLYCERIN (NITROSTAT) 0.4 MG SL tablet DISSOLVE 1 TABLET UNDER TONGUE EVERY 5 MINUTES AS NEEDED CHEST PAIN. 25 tablet 1   oxyCODONE-acetaminophen (PERCOCET/ROXICET) 5-325 MG tablet Take 1 tablet by mouth every 6 (six) hours as needed for severe pain. 120 tablet 0   OZEMPIC, 0.25 OR 0.5 MG/DOSE, 2 MG/3ML SOPN Inject into the skin. (Patient not taking:  Reported on 03/30/2022)     pantoprazole (PROTONIX) 40 MG tablet TAKE ONE TABLET BY MOUTH TWO TIMES DAILY. (OVAL YELLOW TABLET WITH H126) 62 tablet 3   polyethylene glycol (MIRALAX / GLYCOLAX) 17 g packet Take 17 g by mouth daily.     ranolazine (RANEXA) 500 MG 12 hr tablet TAKE 1 TABLET BY MOUTH 2 TIMES DAILY (PEACH COLORED TABLET WITH C49 OR 588) 186 tablet 0   rosuvastatin (CRESTOR) 40 MG tablet Take 1 tablet (40 mg total) by mouth daily. 90 tablet 2   sacubitril-valsartan (ENTRESTO) 24-26 MG TAKE 1 TABLET BY MOUTH 2 TIMES DAILY.(OBLONG PINK TAB WITH LZ NVR) 186 tablet 0   sertraline (ZOLOFT) 25 MG tablet Take 25 mg by mouth daily.     spironolactone (ALDACTONE) 25 MG tablet TAKE 1/2 TABLET(12.5) BY MOUTH DAILY. 45 tablet 3   tamsulosin (FLOMAX) 0.4 MG CAPS capsule TAKE 1 CAPSULE (.$RemoveBef'4MG'YBRwkkoFkx$  TOTAL) BY MOUTH DAILY.(TAN AND GOLD CAPSULE WITH CL23 0.4) 93 capsule 3   traZODone (DESYREL) 150 MG tablet TAKE 1 TABLET($RemoveBefor'150MG'jLAzWaTIQzsQ$ ) BY MOUTH AT BEDTIME(OVAL WHITE TAB WITH 13 32 505050) 93 tablet 1   TRELEGY ELLIPTA 100-62.5-25 MCG/ACT AEPB INHALE 1 PUFF ONCE DAILY 60 each 2   ULTICARE SHORT PEN NEEDLES 31G X 8 MM MISC USE AS DIRECTED (ONCE DAILY WITH LANTUS INJECTION AS INSTRUCTED) 100 each 4   Current Facility-Administered Medications on File Prior to Visit  Medication Dose Route Frequency Provider Last Rate Last Admin   triamcinolone acetonide (KENALOG-40) injection 20 mg  20 mg Intra-articular Once Maryagnes Carrasco, [name], MD       Past Medical History:  Diagnosis Date   Acute combined systolic and diastolic CHF, NYHA class 2 ([place]) 01/22/2020   Acute gout of left elbow 02/14/2022   Acute idiopathic gout of right wrist 12/29/2021   Alcohol abuse    Atherosclerosis of coronary artery of native heart with stable angina pectoris, unspecified vessel or lesion type ([place]) 01/23/2016   Overview:  Completely occluded circumflex artery proximal to obtuse marginal 1 basin cardiac catheter from 2017   Atherosclerosis of native  coronary artery of native heart without angina pectoris 01/23/2016   Overview:  Completely occluded circumflex artery proximal to obtuse marginal 1 basin cardiac catheter from 2017   Bilateral hip pain 11/17/2021   BMI 45.0-49.9, adult ([place]) 01/13/2013   Cellulitis of right upper extremity 01/01/2022   Centrilobular emphysema ([place]) 01/16/2022   Chronic kidney disease    Chronic pain 10/19/2019   Sees pain clinic   COPD (chronic obstructive pulmonary disease) (HCC)    COPD exacerbation ([place]) 10/03/2019   Coronary artery disease involving native heart 02/08/2020   Coronary atherosclerosis of native coronary artery 01/23/2016   Overview:  Completely occluded  circumflex artery proximal to obtuse marginal 1 basin cardiac catheter from 2017   Depression, major, single episode, moderate ([place]) 07/11/2020   Diabetic polyneuropathy ([place]) 12/06/2019   ED (erectile dysfunction) 12/06/2019   Essential hypertension 01/23/2016   Essential thrombocythemia ([place]) 09/29/2011   GERD (gastroesophageal reflux disease) 11/17/2021   GERD without esophagitis    HFrEF (heart failure with reduced ejection fraction) ([place]) 02/08/2020   Knee pain, left 02/12/2012   Formatting of this note might be different from the original. Left   Leukocytosis 09/04/2011   Lumbar pain 11/17/2021   Mild major depression, single episode ([place]) 02/14/2022   Mixed hyperlipidemia 06/18/2017   Mixed incontinence 02/24/2022   Morbid obesity ([place]) 11/21/2020   Morbid obesity with BMI of 40.0-44.9, adult ([place]) 01/13/2013   Myocardial infarction ([place])    04-13-2011   Obstructive chronic bronchitis with exacerbation ([place]) 10/03/2019   Obstructive sleep apnea 07/18/2011   Olecranon bursitis of left elbow 02/14/2022   Olecranon bursitis of left elbow 02/14/2022   Opiate abuse, continuous (HCC)    Osteoarthritis    Pain in knee joint 02/12/2012   Formatting of this note might be different from the original. Left   Personal history of stroke with  residual effects 07/24/2021   Pneumonia    Polysubstance abuse ([place])    Poor dentition    Stage 3b chronic kidney disease ([place]) 12/27/2020   Thrombocytosis 09/29/2011   Tobacco dependence 01/23/2016   Tobacco use disorder 01/23/2016   Type 2 diabetes mellitus with diabetic polyneuropathy [facility])    Past Surgical History:  Procedure Laterality Date   HERNIA REPAIR  2009   I & D KNEE WITH POLY EXCHANGE  09/03/2011   Procedure: IRRIGATION AND DEBRIDEMENT KNEE WITH POLY EXCHANGE;  Surgeon: Mauri Pole, MD;  Location: [facility];  Service: Orthopedics;  Laterality: Left;   TOOTH EXTRACTION Bilateral 08/06/2017   Procedure: DENTAL RESTORATION/EXTRACTIONS;  Surgeon: Diona Browner, DDS;  Location: [facility];  Service: Oral Surgery;  Laterality: Bilateral;   TOTAL KNEE ARTHROPLASTY   right  feb 2012   left march 2012 r   TOTAL KNEE REVISION  06/23/2011   Procedure: TOTAL KNEE REVISION;  Surgeon: Mauri Pole;  Location: [facility];  Service: Orthopedics;  Laterality: Left;  femomal nerve block done in holding area without incident    Family History  Problem Relation Age of Onset   Colon cancer Other    Colon cancer Mother    Social History   Socioeconomic History   Marital status: Widowed    Spouse name: Not on file   Number of children: 5   Years of education: Not on file   Highest education level: Not on file  Occupational History   Occupation: Disabled  Tobacco Use   Smoking status: Every Day    Packs/day: 0.50    Years: 50.00    Total pack years: 25.00    Types: Cigarettes   Smokeless tobacco: Never  Vaping Use   Vaping Use: Former  Substance and Sexual Activity   Alcohol use: Yes    Comment: heavy drinker in past. occasionally has a beer. Not on regular basis.   Drug use: Not Currently    Types: Cocaine   Sexual activity: Not Currently  Other Topics Concern   Not on file  Social History Narrative   Not on file   Social Determinants of Health   Financial Resource Strain: Low  Risk  (02/10/2022)   Overall Financial Resource Strain (CARDIA)  Difficulty of Paying Living Expenses: Not hard at all  Food Insecurity: No Food Insecurity (02/10/2022)   Hunger Vital Sign    Worried About Running Out of Food in the Last Year: Never true    Ran Out of Food in the Last Year: Never true  Transportation Needs: No Transportation Needs (02/10/2022)   PRAPARE - Hydrologist (Medical): No    Lack of Transportation (Non-Medical): No  Physical Activity: Inactive (02/10/2022)   Exercise Vital Sign    Days of Exercise per Week: 0 days    Minutes of Exercise per Session: 0 min  Stress: No Stress Concern Present (02/10/2022)   [place]    Feeling of Stress : Only a little  Social Connections: Socially Isolated (02/10/2022)   Social Connection and Isolation Panel [NHANES]    Frequency of Communication with Friends and Family: More than three times a week    Frequency of Social Gatherings with Friends and Family: Three times a week    Attends Religious Services: Never    Active Member of Clubs or Organizations: No    Attends Archivist Meetings: Never    Marital Status: Widowed    Review of Systems  Constitutional:  Negative for chills and fever.  HENT:  Negative for congestion, rhinorrhea and sore throat.   Respiratory:  Positive for cough. Negative for shortness of breath.   Cardiovascular:  Negative for chest pain, palpitations and leg swelling.  Gastrointestinal:  Negative for abdominal pain, constipation, diarrhea, nausea and vomiting.  Genitourinary:  Negative for dysuria and urgency.  Musculoskeletal:  Positive for arthralgias (left hip pain), back pain and myalgias.  Neurological:  Positive for numbness (left little finger). Negative for dizziness and headaches.  Psychiatric/Behavioral:  Negative for dysphoric mood. The patient is not nervous/anxious.      Objective:  BP  136/86   Pulse 88   Temp (!) 97 F (36.1 C)   Resp 18   Ht $R'5\' 8"'io$  (1.727 m)   Wt (!) 301 lb (136.5 kg)   BMI 45.77 kg/m      03/30/2022   11:11 AM 03/12/2022   11:34 AM 02/10/2022    2:16 PM  BP/Weight  Systolic BP 702 637 858  Diastolic BP 86 84 80  Wt. (Lbs) 301 302.8 295  BMI 45.77 kg/m2 46.04 kg/m2 44.85 kg/m2    Physical Exam Vitals reviewed.  Constitutional:      Appearance: Normal appearance. He is obese.  Neck:     Vascular: No carotid bruit.  Cardiovascular:     Rate and Rhythm: Normal rate and regular rhythm.     Pulses: Normal pulses.     Heart sounds: Normal heart sounds.  Pulmonary:     Effort: Pulmonary effort is normal.     Breath sounds: Normal breath sounds. No wheezing, rhonchi or rales.  Abdominal:     General: Bowel sounds are normal.     Palpations: Abdomen is soft.     Tenderness: There is no abdominal tenderness.  Musculoskeletal:        General: Tenderness (lumbar) present.  Neurological:     Mental Status: He is alert and oriented to person, place, and time.  Psychiatric:        Mood and Affect: Mood normal.        Behavior: Behavior normal.     Diabetic Foot Exam - Simple   Simple Foot Form Diabetic Foot exam  was performed with the following findings: Yes 03/30/2022  8:10 PM  Visual Inspection See comments: Yes Sensation Testing Intact to touch and monofilament testing bilaterally: Yes Pulse Check Posterior Tibialis and Dorsalis pulse intact bilaterally: Yes Comments Dry skin. Calluses.       Lab Results  Component Value Date   WBC 12.9 (H) 01/09/2022   HGB 13.8 01/09/2022   HCT 41.3 01/09/2022   PLT 489 (H) 01/09/2022   GLUCOSE 142 (H) 01/09/2022   CHOL 193 12/29/2021   TRIG 163 (H) 12/29/2021   HDL 40 12/29/2021   LDLCALC 124 (H) 12/29/2021   ALT 10 01/09/2022   AST 5 01/09/2022   NA 138 01/09/2022   K 4.6 01/09/2022   CL 101 01/09/2022   CREATININE 1.08 01/09/2022   BUN 13 01/09/2022   CO2 21 01/09/2022   TSH  0.719 01/24/2021   INR 1.81 (H) 09/07/2011   HGBA1C 6.4 (H) 12/29/2021   MICROALBUR 80 09/27/2020      Assessment & Plan:   Problem List Items Addressed This Visit       Cardiovascular and Mediastinum   Atherosclerosis of native coronary artery of native heart without angina pectoris - Primary    Continue rosuvastatin, zetia, plavix, and aspirin 81 mg daily.       Acute combined systolic and diastolic CHF, NYHA class 2 (HCC)    The current medical regimen is effective;  continue present plan and medications. Continue Entresto 24-26 mg twice a day, Isosorbide 60 mg daily, Spironolactone 25 mg daily, Ranexa 500 mg twice daily, metoprolol XL 25 mg daily.  Management per specialist.  Cardiologist: Dr. Geraldo Pitter.        Coronary artery disease involving native heart    Continue metoprolol xl 25 mg daily, isosorbide 60 mg daily, rosuvastatin 40 mg daily and zetia 10 mg daily.       Relevant Orders   CBC with Differential/Platelet   Comprehensive metabolic panel   Lipid panel     Endocrine   Type 2 diabetes mellitus with diabetic polyneuropathy (HCC)    Control: labs pending  Recommend check sugars fasting daily. Recommend check feet daily. Recommend annual eye exams. Medicines: await a1c to make recommendations.  Continue to work on eating a healthy diet and exercise.  Labs drawn today.         Relevant Orders   Hemoglobin A1c     Other   Chronic pain syndrome    Pain contract signed.  UDS done.  Reviewed the contract in depth.       Uncomplicated opioid dependence ([place])    UDS.      Relevant Orders   Pain Mgt Scrn (14 Drugs), Ur   Need for influenza vaccination   Relevant Orders   Flu Vaccine MDCK QUAD PF (Completed)  .  No orders of the defined types were placed in this encounter.   Orders Placed This Encounter  Procedures   Flu Vaccine MDCK QUAD PF   CBC with Differential/Platelet   Comprehensive metabolic panel   Lipid panel   Hemoglobin A1c    Pain Mgt Scrn (14 Drugs), Ur     Follow-up: Return in about 3 months (around 06/29/2022) for chronic fasting.  An After Visit Summary was printed and given to the patient.  [name], MD [facility] [phone]

## 2022-03-31 LAB — PAIN MGT SCRN (14 DRUGS), UR
Amphetamine Scrn, Ur: NEGATIVE ng/mL
BARBITURATE SCREEN URINE: NEGATIVE ng/mL
BENZODIAZEPINE SCREEN, URINE: NEGATIVE ng/mL
Buprenorphine, Urine: NEGATIVE ng/mL
CANNABINOIDS UR QL SCN: NEGATIVE ng/mL
Cocaine (Metab) Scrn, Ur: NEGATIVE ng/mL
Creatinine(Crt), U: 95 mg/dL (ref 20.0–300.0)
Fentanyl, Urine: NEGATIVE pg/mL
Meperidine Screen, Urine: NEGATIVE ng/mL
Methadone Screen, Urine: NEGATIVE ng/mL
OXYCODONE+OXYMORPHONE UR QL SCN: POSITIVE ng/mL — AB
Opiate Scrn, Ur: NEGATIVE ng/mL
Ph of Urine: 5.7 (ref 4.5–8.9)
Phencyclidine Qn, Ur: NEGATIVE ng/mL
Propoxyphene Scrn, Ur: NEGATIVE ng/mL
Tramadol Screen, Urine: NEGATIVE ng/mL

## 2022-03-31 LAB — COMPREHENSIVE METABOLIC PANEL
ALT: 12 IU/L (ref 0–44)
AST: 10 IU/L (ref 0–40)
Albumin/Globulin Ratio: 1.8 (ref 1.2–2.2)
Albumin: 4.3 g/dL (ref 3.9–4.9)
Alkaline Phosphatase: 53 IU/L (ref 44–121)
BUN/Creatinine Ratio: 13 (ref 10–24)
BUN: 14 mg/dL (ref 8–27)
Bilirubin Total: 0.4 mg/dL (ref 0.0–1.2)
CO2: 23 mmol/L (ref 20–29)
Calcium: 10.1 mg/dL (ref 8.6–10.2)
Chloride: 96 mmol/L (ref 96–106)
Creatinine, Ser: 1.12 mg/dL (ref 0.76–1.27)
Globulin, Total: 2.4 g/dL (ref 1.5–4.5)
Glucose: 189 mg/dL — ABNORMAL HIGH (ref 70–99)
Potassium: 4.7 mmol/L (ref 3.5–5.2)
Sodium: 135 mmol/L (ref 134–144)
Total Protein: 6.7 g/dL (ref 6.0–8.5)
eGFR: 74 mL/min/{1.73_m2} (ref >59)

## 2022-03-31 LAB — CBC WITH DIFFERENTIAL/PLATELET
Basophils Absolute: 0.1 10*3/uL (ref 0.0–0.2)
Basos: 0 %
EOS (ABSOLUTE): 0.1 10*3/uL (ref 0.0–0.4)
Eos: 1 %
Hematocrit: 42 % (ref 37.5–51.0)
Hemoglobin: 14.5 g/dL (ref 13.0–17.7)
Immature Grans (Abs): 0.1 10*3/uL (ref 0.0–0.1)
Immature Granulocytes: 1 %
Lymphocytes Absolute: 2.5 10*3/uL (ref 0.7–3.1)
Lymphs: 22 %
MCH: 31.7 pg (ref 26.6–33.0)
MCHC: 34.5 g/dL (ref 31.5–35.7)
MCV: 92 fL (ref 79–97)
Monocytes Absolute: 0.8 10*3/uL (ref 0.1–0.9)
Monocytes: 7 %
Neutrophils Absolute: 8.2 10*3/uL — ABNORMAL HIGH (ref 1.4–7.0)
Neutrophils: 69 %
Platelets: 391 10*3/uL (ref 150–450)
RBC: 4.58 x10E6/uL (ref 4.14–5.80)
RDW: 14.6 % (ref 11.6–15.4)
WBC: 11.8 10*3/uL — ABNORMAL HIGH (ref 3.4–10.8)

## 2022-03-31 LAB — LIPID PANEL
Chol/HDL Ratio: 3.3 ratio (ref 0.0–5.0)
Cholesterol, Total: 240 mg/dL — ABNORMAL HIGH (ref 100–199)
HDL: 73 mg/dL (ref >39)
LDL Chol Calc (NIH): 142 mg/dL — ABNORMAL HIGH (ref 0–99)
Triglycerides: 141 mg/dL (ref 0–149)
VLDL Cholesterol Cal: 25 mg/dL (ref 5–40)

## 2022-03-31 LAB — CARDIOVASCULAR RISK ASSESSMENT

## 2022-03-31 LAB — HEMOGLOBIN A1C
Est. average glucose Bld gHb Est-mCnc: 220 mg/dL
Hgb A1c MFr Bld: 9.3 % — ABNORMAL HIGH (ref 4.8–5.6)

## 2022-03-31 NOTE — Progress Notes (Signed)
Blood count abnormal. Wbc elevated a little, but improved.  Liver function normal.  Kidney function normal.  UDS consistent with pain medicines.  Cholesterol: LDL increased to 142. T cholesterol increased. HDL improved! Please check if patient is still taking rosuvastatin and zetia. Let me know what he says.  HBA1C: Much worse. Increased from 6.4 to 9.3. restart ozempic - see if he has any home. Restart what he was on and then increase in one month.

## 2022-04-06 ENCOUNTER — Other Ambulatory Visit: Payer: Self-pay

## 2022-04-06 MED ORDER — OXYCODONE-ACETAMINOPHEN 5-325 MG PO TABS: 1.0000 | ORAL_TABLET | Freq: Four times a day (QID) | ORAL | 0 refills | Status: DC | PRN

## 2022-04-07 ENCOUNTER — Other Ambulatory Visit: Payer: Self-pay

## 2022-04-07 DIAGNOSIS — J449 Chronic obstructive pulmonary disease, unspecified: Secondary | ICD-10-CM | POA: Diagnosis not present

## 2022-04-07 MED ORDER — OXYCODONE-ACETAMINOPHEN 5-325 MG PO TABS: 1.0000 | ORAL_TABLET | Freq: Four times a day (QID) | ORAL | 0 refills | Status: DC | PRN

## 2022-04-08 ENCOUNTER — Telehealth: Payer: Self-pay

## 2022-04-08 NOTE — Patient Outreach (Signed)
  Care Coordination   Initial Visit Note   04/08/2022 Name: Brent Ortiz MRN: [id number] DOB: 1960-08-06  Brent Ortiz is a 61 y.o. year old male who sees [name], Kirsten, MD for primary care. I spoke with  Brent Ortiz by phone today.  What matters to the patients health and wellness today?  Placed call to patient and explain and offer [facility] care coordination program. Patient has verbally agreed and needs help with his DM    Goals Addressed   None     SDOH assessments and interventions completed:  No     Care Coordination Interventions Activated:  Yes  Care Coordination Interventions:  Yes, provided   Follow up plan: Follow up call scheduled for 04/09/2022    Encounter Outcome:  Pt. Visit Completed   Brent Rand, RN, BSN, CEN [place] Coordinator [phone]

## 2022-04-09 ENCOUNTER — Ambulatory Visit: Payer: Self-pay

## 2022-04-09 NOTE — Patient Outreach (Signed)
  Care Coordination   04/09/2022 Name: Brent Ortiz MRN: [id number] DOB: [birth year]   Care Coordination Outreach Attempts:  An unsuccessful telephone outreach was attempted for a scheduled appointment today.  Follow Up Plan:  Additional outreach attempts will be made to offer the patient care coordination information and services.   Encounter Outcome:  No Answer  Care Coordination Interventions Activated:  No   Care Coordination Interventions:  No, not indicated    Tomasa Rand, RN, BSN, CEN [facility] [employer] [phone]

## 2022-04-14 ENCOUNTER — Other Ambulatory Visit: Payer: Self-pay | Admitting: Legal Medicine

## 2022-04-14 MED ORDER — RANOLAZINE ER 500 MG PO TB12: ORAL_TABLET | ORAL | 0 refills | Status: DC

## 2022-04-15 ENCOUNTER — Other Ambulatory Visit: Payer: Self-pay

## 2022-04-16 ENCOUNTER — Ambulatory Visit: Payer: Self-pay

## 2022-04-16 MED ORDER — RANOLAZINE ER 500 MG PO TB12: ORAL_TABLET | ORAL | 0 refills | Status: DC

## 2022-04-16 MED ORDER — METOPROLOL SUCCINATE ER 25 MG PO TB24: 25.0000 mg | ORAL_TABLET | Freq: Every day | ORAL | 3 refills | Status: DC

## 2022-04-16 MED ORDER — ROSUVASTATIN CALCIUM 40 MG PO TABS: 40.0000 mg | ORAL_TABLET | Freq: Every day | ORAL | 2 refills | Status: DC

## 2022-04-16 NOTE — Patient Outreach (Signed)
  Care Coordination   Initial Visit Note   04/16/2022 Name: Brent Ortiz MRN: [id number] DOB: 1960-11-16  Brent Ortiz is a 61 y.o. year old male who sees [name], Kirsten, MD for primary care. I spoke with  Brent Ortiz by phone today.  What matters to the patients health and wellness today?  Placed call to patient today for scheduled appointment. Patient reports that he is in [place] with his son.  Reports he would return in 1 month.    Rescheduled appointment for next month.    SDOH assessments and interventions completed:  No     Care Coordination Interventions Activated:  No  Care Coordination Interventions:  No, not indicated   Follow up plan: Follow up call scheduled for 05/13/2022    Encounter Outcome:  Pt. Visit Completed   Brent Rand, RN, BSN, CEN [place] Coordinator [phone]

## 2022-04-20 DIAGNOSIS — G4733 Obstructive sleep apnea (adult) (pediatric): Secondary | ICD-10-CM | POA: Diagnosis not present

## 2022-04-28 ENCOUNTER — Other Ambulatory Visit: Payer: Self-pay | Admitting: Legal Medicine

## 2022-04-28 ENCOUNTER — Other Ambulatory Visit: Payer: Self-pay | Admitting: Family Medicine

## 2022-04-30 ENCOUNTER — Other Ambulatory Visit: Payer: Self-pay | Admitting: Legal Medicine

## 2022-04-30 ENCOUNTER — Other Ambulatory Visit: Payer: Self-pay | Admitting: Family Medicine

## 2022-04-30 DIAGNOSIS — F321 Major depressive disorder, single episode, moderate: Secondary | ICD-10-CM

## 2022-04-30 MED ORDER — OZEMPIC (0.25 OR 0.5 MG/DOSE) 2 MG/3ML ~~LOC~~ SOPN: 0.5000 mg | PEN_INJECTOR | SUBCUTANEOUS | 2 refills | Status: DC

## 2022-05-05 ENCOUNTER — Other Ambulatory Visit: Payer: Self-pay

## 2022-05-05 MED ORDER — OXYCODONE-ACETAMINOPHEN 5-325 MG PO TABS: 1.0000 | ORAL_TABLET | Freq: Four times a day (QID) | ORAL | 0 refills | Status: DC | PRN

## 2022-05-06 ENCOUNTER — Telehealth: Payer: Self-pay | Admitting: Family Medicine

## 2022-05-06 NOTE — Telephone Encounter (Signed)
Faxed office notes to [facility] [phone] for pt's pull ups

## 2022-05-08 DIAGNOSIS — J449 Chronic obstructive pulmonary disease, unspecified: Secondary | ICD-10-CM | POA: Diagnosis not present

## 2022-05-13 ENCOUNTER — Telehealth: Payer: Self-pay

## 2022-05-13 ENCOUNTER — Ambulatory Visit: Payer: Self-pay

## 2022-05-13 NOTE — Patient Outreach (Signed)
  Care Coordination   Initial Visit Note   05/13/2022 Name: LESHON ARMISTEAD MRN: [id number] DOB: [birth year]  DIN BOOKWALTER is a 61 y.o. year old male who sees [name], Kirsten, MD for primary care. I spoke with  Ortencia Kick Locascio by phone today.  What matters to the patients health and wellness today?  Placed call to patient who reports that he is doing "so so'.  Reviewed concerns for patient today. Patient is not using his oxygen or CPAP. Reports that he does not know the oxygen concentration that he was prescribed.  Reports that he is not taking his latus or ozempic. Not self monitoring.      Goals Addressed               This Visit's Progress     Patient needs help with DM (pt-stated)        Care Coordination Interventions: Provided education to patient about basic DM disease process Reviewed medications with patient and discussed importance of medication adherence Counseled on importance of regular laboratory monitoring as prescribed Reviewed scheduled/upcoming provider appointments including: PCP Advised patient, providing education and rationale, to check cbg as suggested by MD and record, calling MD for findings outside established parameters Reviewed importance of taking all medications.  Assessed that patient is not taking his Lantus or Ozempic.  Reviewed patients concern for hip pain.  Encouraged patient to call MD for an appointment.  Reviewed with patient the importance of using his oxygen and CPAP machine as prescribed.          SDOH assessments and interventions completed:  Yes  SDOH Interventions Today    Flowsheet Row Most Recent Value  SDOH Interventions   Food Insecurity Interventions Intervention Not Indicated  Housing Interventions Intervention Not Indicated  Transportation Interventions Intervention Not Indicated  Utilities Interventions Intervention Not Indicated  Alcohol Usage Interventions Intervention Not Indicated (Score <7)  Financial Strain  Interventions Intervention Not Indicated  Physical Activity Interventions Intervention Not Indicated  Stress Interventions Intervention Not Indicated        Care Coordination Interventions Activated:  Yes  Care Coordination Interventions:  Yes, provided   Follow up plan: Follow up call scheduled for 05/25/2022    Encounter Outcome:  Pt. Visit Completed   Tomasa Rand, RN, BSN, CEN [place] Coordinator [phone]

## 2022-05-13 NOTE — Progress Notes (Signed)
Chronic Care Management Pharmacy Assistant   Name: Brent Ortiz  MRN: [id number] DOB: December 12, 1960  Reason for Encounter: Disease State/ General  Recent office visits:  05-13-2022 Brent Ates, RN (CCM).  04-16-2022  Brent Ates, RN (CCM).  04-09-2022  Brent Ates, RN (CCM).  03-30-2022 [name], [name], MD. WBC 11.8, Neutrophils Absolute= 8.2. Glucose= 189. A1C= 9.3. Cholesterol, Total= 240, LDL= 142.  03-18-2022 Brent Harbour, NP. Video visit for covid exposure. Negative covid test.  02-10-2022 Brent Mayo, NP. Annual wellness visit.  02-09-2022 Brent Brome, MD. STOP Bactrim and colchicine-probenecid. START colchicine 0.6 MG  TAKE 1 TABLET(0.6 MG) BY MOUTH TWICE DAILY FOR 7 DAYS THEN TAKE 1 TABLET(0.6 MG) BY MOUTH DAILY FOR 23 DAYS.  01-09-2022 Brent Brome, MD. CRP= 16. WBC= 12.9, Platelets= 489, Neutrophils Absolute= 9.0. Glucose= 142. SED rate= 61. STOP keflex, prednisone and oxycodone. START PERCOCET/ROXICET 5-325 mg every 6 hours as needed.  Recent consult visits:  03-19-2022 Brent Ortiz, DPM (Podiatry). START ammonium lactate 1 application PRN.  03-12-2022 [name], [name], MD (Cardiology). EKG completed.  01-20-2022 Brent Ortiz (Orthopedic surgery). Unable to view encounter.  Hospital visits:  None in previous 6 months  Medications: Outpatient Encounter Medications as of 05/13/2022  Medication Sig   Accu-Chek Softclix Lancets lancets Use as instructed   albuterol (VENTOLIN HFA) 108 (90 Base) MCG/ACT inhaler INHALE 2 PUFFS INTO THE LUNGS EVERY 4 HOURS AS NEEDED FOR WHEEZING OR SHORTNESS OF BREATH.   allopurinol (ZYLOPRIM) 100 MG tablet TAKE 1 TABLET BY MOUTH ONCE DAILY (WHITE ROUND TABLET WITH 209)   amLODipine (NORVASC) 5 MG tablet Take 5 mg by mouth daily.   ammonium lactate (LAC-HYDRIN) 12 % lotion Apply 1 Application topically as needed for dry skin.   aspirin EC (GOODSENSE ASPIRIN LOW DOSE) 81 MG tablet TAKE 1 TABLET (81MG) BY  MOUTH DAILY. SWALLOW WHOLE.(YELLOW ROUND TAB WITH A HEART)   Blood Glucose Monitoring Suppl (ACCU-CHEK GUIDE) w/Device KIT 1 each by Does not apply route in the morning, at noon, and at bedtime.   clopidogrel (PLAVIX) 75 MG tablet TAKE 1 TABLET BY MOUTH ONCE DAILY (ROUND PINK TABLET WITH E 34)   colchicine 0.6 MG tablet TAKE 1 TABLET(0.6 MG) BY MOUTH TWICE DAILY FOR 7 DAYS THEN TAKE 1 TABLET(0.6 MG) BY MOUTH DAILY FOR 23 DAYS   ezetimibe (ZETIA) 10 MG tablet Take 10 mg by mouth daily.   famotidine (PEPCID) 20 MG tablet Take 20 mg by mouth 2 (two) times daily.   gabapentin (NEURONTIN) 300 MG capsule TAKE 1 CAPSULE (300MG) BY MOUTH TWICE DAILY.(YELLOW CAP)   Glucagon, rDNA, (GLUCAGON EMERGENCY) 1 MG KIT Inject 1 mg into the muscle as needed (BG less than 70).   glucose blood (ACCU-CHEK GUIDE) test strip Use as instructed   isosorbide mononitrate (IMDUR) 60 MG 24 hr tablet Take 60 mg by mouth daily.   LANTUS SOLOSTAR 100 UNIT/ML Solostar Pen ADMINISTER 30 UNITS UNDER THE SKIN DAILY (Patient not taking: Reported on 05/13/2022)   metFORMIN (GLUCOPHAGE) 500 MG tablet TAKE 1 TABLET BY MOUTH EVERY DAY WITH SUPPER.(ROUND WHITE TAB WITH G10, or LL)   metoprolol succinate (TOPROL-XL) 25 MG 24 hr tablet Take 1 tablet (25 mg total) by mouth daily.   nicotine (NICODERM CQ - DOSED IN MG/24 HR) 7 mg/24hr patch APPLY 1 PATCH ONTO THE SKIN ONCE DAILY ** DISCARD PROPERLY   nitroGLYCERIN (NITROSTAT) 0.4 MG SL tablet DISSOLVE 1 TABLET UNDER TONGUE EVERY 5 MINUTES AS NEEDED  CHEST PAIN.   oxyCODONE-acetaminophen (PERCOCET/ROXICET) 5-325 MG tablet Take 1 tablet by mouth every 6 (six) hours as needed for severe pain.   pantoprazole (PROTONIX) 40 MG tablet TAKE ONE TABLET BY MOUTH TWO TIMES DAILY. (OVAL YELLOW TABLET WITH H126)   polyethylene glycol (MIRALAX / GLYCOLAX) 17 g packet Take 17 g by mouth daily.   ranolazine (RANEXA) 500 MG 12 hr tablet TAKE 1 TABLET BY MOUTH 2 TIMES DAILY (PEACH COLORED TABLET WITH C49 OR 588)    rosuvastatin (CRESTOR) 40 MG tablet Take 1 tablet (40 mg total) by mouth daily.   sacubitril-valsartan (ENTRESTO) 24-26 MG TAKE 1 TABLET BY MOUTH 2 TIMES DAILY.(OBLONG PINK TAB WITH LZ NVR)   Semaglutide,0.25 or 0.5MG/DOS, (OZEMPIC, 0.25 OR 0.5 MG/DOSE,) 2 MG/3ML SOPN Inject 0.5 mg into the skin every 7 (seven) days. (Patient not taking: Reported on 05/13/2022)   sertraline (ZOLOFT) 25 MG tablet TAKE 1 TABLET (25MG) BY MOUTH DAILY.(OBLONG GREEN TABLET WITH A 16)   spironolactone (ALDACTONE) 25 MG tablet TAKE 1/2 TABLET(12.5) BY MOUTH DAILY.   tamsulosin (FLOMAX) 0.4 MG CAPS capsule TAKE 1 CAPSULE (.4MG TOTAL) BY MOUTH DAILY.(TAN AND GOLD CAPSULE WITH CL23 0.4)   traZODone (DESYREL) 150 MG tablet TAKE 1 TABLET(150MG) BY MOUTH AT BEDTIME (OVAL WHITE TABLET WITH 8 07, or 13 32 & 50 50 50)   TRELEGY ELLIPTA 100-62.5-25 MCG/ACT AEPB INHALE 1 PUFF ONCE DAILY   ULTICARE SHORT PEN NEEDLES 31G X 8 MM MISC USE AS DIRECTED (ONCE DAILY WITH LANTUS INJECTION AS INSTRUCTED)   Facility-Administered Encounter Medications as of 05/13/2022  Medication   triamcinolone acetonide (KENALOG-40) injection 20 mg  Contacted Javontay M Klatt for General Review Call   Chart Review:  Have there been any documented new, changed, or discontinued medications since last visit? No  Has there been any documented recent hospitalizations or ED visits since last visit with Clinical Pharmacist? Yes Brief Summary (including medication and/or Diagnosis changes): None   Adherence Review:  Does the Clinical Pharmacist Assistant have access to adherence rates? Yes Adherence rates for STAR metric medications (Review star rating below). Adherence rates for medications indicated for disease state being reviewed (List medication(s)/day supply/ last 2 fill dates). Does the patient have >5 day gap between last estimated fill dates for any of the above medications or other medication gaps? No Reason for medication gaps.   Disease  State Questions:  Able to connect with Patient? Yes  Did patient have any problems with their health recently? No  Have you had any admissions or emergency room visits or worsening of your condition(s) since last visit? Yes  Have you had any visits with new specialists or providers since your last visit? No  Have you had any new health care problem(s) since your last visit? No  Have you run out of any of your medications since you last spoke with clinical pharmacist? No  Are there any medications you are not taking as prescribed? No  Are you having any issues or side effects with your medications? No  Do you have any other health concerns or questions you want to discuss with your Clinical Pharmacist before your next visit? No  Are there any health concerns that you feel we can do a better job addressing? No  Are you having any problems with any of the following since the last visit: (select all that apply)  None  12. Any falls since last visit? No  13. Any increased or uncontrolled pain since last visit? No  14. Next visit Type: telephone       Visit with:  Brent Ates, RN        Date: 05-25-2022        Time: 10:15  15. Additional Details? No    Care Gaps: HIV Screening overdue Covid booster overdue Yearly ophthalmology overdue  Star Rating Drugs: Rosuvastatin 40 mg- Last filled 04-20-2022 30 DS. Previous 03-19-2022 31 DS Entresto 24-26 mg- Last filled 04-20-2022 30 DS. Previous 03-19-2022 31 DS Metformin 500 mg- Last filled 04-20-2022 30 DS. Previous 03-19-2022 31 DS  [place] Clinical Pharmacist Assistant [phone]

## 2022-05-21 DIAGNOSIS — G4733 Obstructive sleep apnea (adult) (pediatric): Secondary | ICD-10-CM | POA: Diagnosis not present

## 2022-05-25 ENCOUNTER — Ambulatory Visit: Payer: Self-pay

## 2022-05-25 NOTE — Patient Outreach (Signed)
  Care Coordination   Follow Up Visit Note   05/25/2022 Name: Brent Ortiz MRN: [id number] DOB: Feb 02, 1961  Brent Ortiz is a 61 y.o. year old male who sees [name], Kirsten, MD for primary care. I spoke with  Brent Ortiz by phone today.  What matters to the patients health and wellness today?  Placed call to patient today and he reports that he is doing well. Reports that he started his insulin back. Reports his CBG is ranging from 130-200. Yesterdays reading was 141. He is monitoring 2 times per day.  Reports he is having a lot of left hip pain.  7/10 today. Reports his pain medications is no longer helping.  Reports his lift chair will be delivered today.     Goals Addressed               This Visit's Progress     Patient needs help with DM (pt-stated)        Care Coordination Interventions: Patient is taking his insulin as prescribed. Is self monitoring 2 times per day.  Reviewed importance of eating more than 1 meal per day. Encourage patient to snack if he does not eat a meal.  Reviewed importance of continuing to take his medications as prescribed and self monitor. Reviewed concern for pain today.  Reviewed next appointment with PCP and encouraged patient to call MD office to get a sooner appointment to discuss his concerns with his hip.         SDOH assessments and interventions completed:  No     Care Coordination Interventions Activated:  Yes  Care Coordination Interventions:  Yes, provided   Follow up plan: Follow up call scheduled for 06/24/2022    Encounter Outcome:  Pt. Visit Completed   Tomasa Rand, RN, BSN, CEN [place] Coordinator [phone]

## 2022-05-26 DIAGNOSIS — G4733 Obstructive sleep apnea (adult) (pediatric): Secondary | ICD-10-CM | POA: Diagnosis not present

## 2022-06-02 ENCOUNTER — Other Ambulatory Visit: Payer: Self-pay | Admitting: Legal Medicine

## 2022-06-02 ENCOUNTER — Other Ambulatory Visit: Payer: Self-pay

## 2022-06-02 ENCOUNTER — Other Ambulatory Visit: Payer: Self-pay | Admitting: Family Medicine

## 2022-06-02 DIAGNOSIS — K2100 Gastro-esophageal reflux disease with esophagitis, without bleeding: Secondary | ICD-10-CM

## 2022-06-02 DIAGNOSIS — N401 Enlarged prostate with lower urinary tract symptoms: Secondary | ICD-10-CM

## 2022-06-02 MED ORDER — OXYCODONE-ACETAMINOPHEN 5-325 MG PO TABS: 1.0000 | ORAL_TABLET | Freq: Four times a day (QID) | ORAL | 0 refills | Status: DC | PRN

## 2022-06-02 NOTE — Telephone Encounter (Signed)
These medications need to be sent to [facility]. It was sent to [place] and patient does not use them! He only uses [facility].

## 2022-06-04 ENCOUNTER — Other Ambulatory Visit: Payer: Self-pay

## 2022-06-04 ENCOUNTER — Other Ambulatory Visit: Payer: Self-pay | Admitting: Family Medicine

## 2022-06-04 MED ORDER — PANTOPRAZOLE SODIUM 40 MG PO TBEC: 40.0000 mg | DELAYED_RELEASE_TABLET | Freq: Two times a day (BID) | ORAL | 0 refills | Status: DC

## 2022-06-04 MED ORDER — OXYCODONE-ACETAMINOPHEN 5-325 MG PO TABS: 1.0000 | ORAL_TABLET | Freq: Four times a day (QID) | ORAL | 0 refills | Status: DC | PRN

## 2022-06-05 ENCOUNTER — Other Ambulatory Visit: Payer: Self-pay | Admitting: Family Medicine

## 2022-06-07 DIAGNOSIS — J449 Chronic obstructive pulmonary disease, unspecified: Secondary | ICD-10-CM | POA: Diagnosis not present

## 2022-06-09 ENCOUNTER — Other Ambulatory Visit: Payer: Self-pay

## 2022-06-09 DIAGNOSIS — E1142 Type 2 diabetes mellitus with diabetic polyneuropathy: Secondary | ICD-10-CM

## 2022-06-10 MED ORDER — OZEMPIC (0.25 OR 0.5 MG/DOSE) 2 MG/3ML ~~LOC~~ SOPN: PEN_INJECTOR | SUBCUTANEOUS | 0 refills | Status: DC

## 2022-06-10 MED ORDER — TRELEGY ELLIPTA 100-62.5-25 MCG/ACT IN AEPB: INHALATION_SPRAY | RESPIRATORY_TRACT | 2 refills | Status: DC

## 2022-06-10 MED ORDER — LANTUS SOLOSTAR 100 UNIT/ML ~~LOC~~ SOPN: PEN_INJECTOR | SUBCUTANEOUS | 3 refills | Status: DC

## 2022-06-11 ENCOUNTER — Encounter: Payer: Self-pay | Admitting: Podiatry

## 2022-06-11 ENCOUNTER — Ambulatory Visit (INDEPENDENT_AMBULATORY_CARE_PROVIDER_SITE_OTHER): Payer: Medicare Other | Admitting: Podiatry

## 2022-06-11 VITALS — BP 165/101

## 2022-06-11 DIAGNOSIS — B351 Tinea unguium: Secondary | ICD-10-CM

## 2022-06-11 DIAGNOSIS — E1142 Type 2 diabetes mellitus with diabetic polyneuropathy: Secondary | ICD-10-CM | POA: Diagnosis not present

## 2022-06-11 DIAGNOSIS — M79609 Pain in unspecified limb: Secondary | ICD-10-CM | POA: Diagnosis not present

## 2022-06-11 NOTE — Progress Notes (Signed)
  Subjective:  Patient ID: Brent Ortiz, male    DOB: 1961-06-19,  MRN: [id number]  [name] presents to clinic today for at risk foot care with history of diabetic neuropathy and painful elongated mycotic toenails 1-5 bilaterally which are tender when wearing enclosed shoe gear. Pain is relieved with periodic professional debridement.  Chief Complaint  Patient presents with   Nail Problem    DFC BS-did not check today A1C-do not know PCP-Kirsten [name] PCP- 2 months ago    New problem(s): None.   PCP is [name], Kirsten, MD.  Allergies  Allergen Reactions   Motrin [Ibuprofen] Other (See Comments)    Reaction: ulcers   Other Nausea And Vomiting    Patient has ulcers    Review of Systems: Negative except as noted in the HPI.  Objective: No changes noted in today's physical examination. Vitals:   06/11/22 0932  BP: (!) 165/101   Brent Ortiz is a pleasant 61 y.o. male morbidly obese in NAD. AAO x 3.  Vascular Examination: CFT <3 seconds b/l LE. Faintly palpable DP pulses b/l LE. Faintly palpable PT pulse(s) b/l LE. Pedal hair sparse. No pain with calf compression b/l. Lower extremity skin temperature gradient within normal limits. Trace edema b/l. Chronic venous insuffiency noted b/l. No ischemia or gangrene noted b/l LE. No cyanosis or clubbing noted b/l LE.  Neurological Examination: Sensation grossly intact b/l with 10 gram monofilament. Vibratory sensation intact b/l. Protective sensation intact 5/5 intact bilaterally with 10g monofilament b/l. Vibratory sensation diminished b/l. Babinski reflex negative b/l.  Dermatological Examination: No open wounds b/l LE. No interdigital macerations noted b/l LE. Pedal skin noted to be dry b/l lower extremities.Toenails 1-5 b/l thick, discolored, elongated with subungual debris and pain on dorsal palpation. No hyperkeratotic lesions noted b/l.   Musculoskeletal Examination: Muscle strength 5/5 to b/l LE. Muscle strength 5/5  to all lower extremity muscle groups bilaterally. Pes planus deformity noted bilateral LE. Utilizes cane for ambulation assistance.  Radiographs: None  Assessment/Plan: 1. Pain due to onychomycosis of nail   2. Diabetic polyneuropathy associated with type 2 diabetes mellitus ([place])   -Patient was evaluated and treated. All patient's and/or POA's questions/concerns answered on today's visit. -Consent given for treatment as described below: -Continue diabetic foot care principles: inspect feet daily, monitor glucose as recommended by PCP and/or Endocrinologist, and follow prescribed diet per PCP, Endocrinologist and/or dietician. -Toenails 1-5 b/l were debrided in length and girth with sterile nail nippers and dremel without iatrogenic bleeding.  -Patient/POA to call should there be question/concern in the interim.   Return in about 3 months (around 09/10/2022).  [name], DPM

## 2022-06-12 ENCOUNTER — Other Ambulatory Visit: Payer: Self-pay

## 2022-06-12 MED ORDER — TRELEGY ELLIPTA 100-62.5-25 MCG/ACT IN AEPB: INHALATION_SPRAY | RESPIRATORY_TRACT | 2 refills | Status: DC

## 2022-06-18 ENCOUNTER — Telehealth: Payer: Self-pay

## 2022-06-18 NOTE — Chronic Care Management (AMB) (Signed)
Chronic Care Management Pharmacy Assistant   Name: Brent Ortiz  MRN: [id number] DOB: 04/29/1961  Reason for Encounter: Disease State/ Hypertension  Recent office visits:  05-25-2022 [name], RN (CCM)  Recent consult visits:  06-11-2022 [name], DPM (Podiatry). Toenails 1-5 b/l were debrided in length and girth with sterile nail nippers and dremel without iatrogenic bleeding.   Hospital visits:  None in previous 6 months  Medications: Outpatient Encounter Medications as of 06/18/2022  Medication Sig   Accu-Chek Softclix Lancets lancets Use as instructed   albuterol (VENTOLIN HFA) 108 (90 Base) MCG/ACT inhaler INHALE 2 PUFFS INTO THE LUNGS EVERY 4 HOURS AS NEEDED FOR WHEEZING OR SHORTNESS OF BREATH.   allopurinol (ZYLOPRIM) 100 MG tablet TAKE 1 TABLET BY MOUTH ONCE DAILY (WHITE ROUND TABLET WITH 209)   amLODipine (NORVASC) 5 MG tablet Take 5 mg by mouth daily.   ammonium lactate (LAC-HYDRIN) 12 % lotion Apply 1 Application topically as needed for dry skin.   aspirin EC (GOODSENSE ASPIRIN LOW DOSE) 81 MG tablet TAKE 1 TABLET (81MG) BY MOUTH DAILY. SWALLOW WHOLE.(YELLOW ROUND TAB WITH A HEART)   Blood Glucose Monitoring Suppl (ACCU-CHEK GUIDE) w/Device KIT 1 each by Does not apply route in the morning, at noon, and at bedtime.   clopidogrel (PLAVIX) 75 MG tablet TAKE 1 TABLET BY MOUTH ONCE DAILY (ROUND PINK TABLET WITH E 34)   colchicine 0.6 MG tablet TAKE 1 TABLET(0.6 MG) BY MOUTH TWICE DAILY FOR 7 DAYS THEN TAKE 1 TABLET(0.6 MG) BY MOUTH DAILY FOR 23 DAYS   ezetimibe (ZETIA) 10 MG tablet Take 10 mg by mouth daily.   famotidine (PEPCID) 20 MG tablet Take 20 mg by mouth 2 (two) times daily.   gabapentin (NEURONTIN) 300 MG capsule TAKE 1 CAPSULE (300MG) BY MOUTH TWICE DAILY.(YELLOW CAP)   Glucagon, rDNA, (GLUCAGON EMERGENCY) 1 MG KIT Inject 1 mg into the muscle as needed (BG less than 70).   glucose blood (ACCU-CHEK GUIDE) test strip Use as instructed   insulin  glargine (LANTUS SOLOSTAR) 100 UNIT/ML Solostar Pen ADMINISTER 30 UNITS UNDER THE SKIN DAILY   isosorbide mononitrate (IMDUR) 60 MG 24 hr tablet Take 60 mg by mouth daily.   metFORMIN (GLUCOPHAGE) 500 MG tablet TAKE 1 TABLET BY MOUTH EVERY DAY WITH SUPPER.(ROUND WHITE TAB WITH G10, or LL)   metoprolol succinate (TOPROL-XL) 25 MG 24 hr tablet Take 1 tablet (25 mg total) by mouth daily.   nicotine (NICODERM CQ - DOSED IN MG/24 HR) 7 mg/24hr patch APPLY 1 PATCH ONTO THE SKIN ONCE DAILY ** DISCARD PROPERLY   nitroGLYCERIN (NITROSTAT) 0.4 MG SL tablet DISSOLVE 1 TABLET UNDER TONGUE EVERY 5 MINUTES AS NEEDED CHEST PAIN.   oxyCODONE-acetaminophen (PERCOCET/ROXICET) 5-325 MG tablet Take 1 tablet by mouth every 6 (six) hours as needed for severe pain.   pantoprazole (PROTONIX) 40 MG tablet Take 1 tablet (40 mg total) by mouth 2 (two) times daily.   polyethylene glycol (MIRALAX / GLYCOLAX) 17 g packet Take 17 g by mouth daily.   ranolazine (RANEXA) 500 MG 12 hr tablet TAKE 1 TABLET BY MOUTH 2 TIMES DAILY (PEACH COLORED TABLET WITH C49 OR 588)   rosuvastatin (CRESTOR) 40 MG tablet Take 1 tablet (40 mg total) by mouth daily.   sacubitril-valsartan (ENTRESTO) 24-26 MG TAKE 1 TABLET BY MOUTH 2 TIMES DAILY.(OBLONG PINK TAB WITH LZ NVR)   Semaglutide,0.25 or 0.5MG/DOS, (OZEMPIC, 0.25 OR 0.5 MG/DOSE,) 2 MG/3ML SOPN INJECT 0.5MG INTO THE SKIN ONCE A  WEEK.   sertraline (ZOLOFT) 25 MG tablet TAKE 1 TABLET (25MG) BY MOUTH DAILY.(OBLONG GREEN TABLET WITH A 16)   spironolactone (ALDACTONE) 25 MG tablet TAKE 1/2 TABLET(12.5) BY MOUTH DAILY.   tamsulosin (FLOMAX) 0.4 MG CAPS capsule TAKE 1 CAPSULE (.4MG TOTAL) BY MOUTH DAILY.(GREEN AND GOLD CAPSULE WITH W 516)   traZODone (DESYREL) 150 MG tablet TAKE 1 TABLET(150MG) BY MOUTH AT BEDTIME (OVAL WHITE TABLET WITH 8 07, or 13 32 & 50 50 50)   TRELEGY ELLIPTA 100-62.5-25 MCG/ACT AEPB INHALE 1 PUFF ONCE DAILY   ULTICARE SHORT PEN NEEDLES 31G X 8 MM MISC USE AS DIRECTED (ONCE  DAILY WITH LANTUS INJECTION AS INSTRUCTED)   Facility-Administered Encounter Medications as of 06/18/2022  Medication   triamcinolone acetonide (KENALOG-40) injection 20 mg   Recent Office Vitals: BP Readings from Last 3 Encounters:  06/11/22 (!) 165/101  03/30/22 136/86  03/12/22 134/84   Pulse Readings from Last 3 Encounters:  03/30/22 88  03/12/22 95  02/09/22 (!) 130    Wt Readings from Last 3 Encounters:  03/30/22 (!) 301 lb (136.5 kg)  03/12/22 (!) 302 lb 12.8 oz (137.3 kg)  02/10/22 295 lb (133.8 kg)     Kidney Function Lab Results  Component Value Date/Time   CREATININE 1.12 03/30/2022 12:04 PM   CREATININE 1.08 01/09/2022 09:24 AM   CREATININE 0.87 09/29/2011 05:00 PM   GFRNONAA 68 06/26/2020 03:00 PM   GFRNONAA >89 09/29/2011 05:00 PM   GFRAA 78 06/26/2020 03:00 PM   GFRAA >89 09/29/2011 05:00 PM       Latest Ref Rng & Units 03/30/2022   12:04 PM 01/09/2022    9:24 AM 12/29/2021   10:42 AM  BMP  Glucose 70 - 99 mg/dL 189  142  144   BUN 8 - 27 mg/dL _0 Creatinine 0.76 - 1.27 mg/dL 1.12  1.08  1.22   BUN/Creat Ratio 10 - _1 Sodium 134 - 144 mmol/L 135  138  139   Potassium 3.5 - 5.2 mmol/L 4.7  4.6  3.6   Chloride 96 - 106 mmol/L 96  101  100   CO2 20 - 29 mmol/L _2 Calcium 8.6 - 10.2 mg/dL 10.1  9.6  10.0      Current antihypertensive regimen:  Amlodipine 5 mg daily Metoprolol 25 mg daily Spironolactone 25 mg daily  Patient verbally confirms he is taking the above medications as directed. Yes  Do you receive your medications through PAP? No    How often are you checking your Blood Pressure? Patient stated he doesn't check blood pressure at home  Current home BP readings: None last office visit 06-11-2022 165/101 Wrist or arm cuff: Patient stated he is getting a new blood pressure monitor with his insurance because the one he has doesn't work. Caffeine intake: 1 cup of coffee daily Salt intake: Limited OTC  medications including pseudoephedrine or NSAIDs? None   What recent interventions/DTPs have been made by any provider to improve Blood Pressure control since last CPP Visit:  Educated on BP goals and benefits of medications for prevention of heart attack, stroke and kidney damage;   Any recent hospitalizations or ED visits since last visit with CPP? Yes  What diet changes have been made to improve Blood Pressure Control?  Patient stated he walks some days as much as he can.  What exercise is being done to  improve your Blood Pressure Control?  Patient stated he limits his salt intake and drinks plenty of water daily.  NOTES: Informed patient that I will call in 2 weeks for blood pressure readings.  Adherence Review: Is the patient currently on ACE/ARB medication? Yes Does the patient have >5 day gap between last estimated fill dates? CPP to review  Care Gaps: Last annual wellness visit? None HIV Screening overdue Covid booster overdue Yearly ophthalmology overdue Shingrix overdue Tdap overdue  Star Rating Drugs: Rosuvastatin 40 mg- Last filled 05-20-2022 31 DS. Previous 04-20-2022 30 DS Entresto 24-26 mg- Last filled 05-20-2022 31 DS. Previous 04-20-2022 30 DS.  Metformin 500 mg- Last filled 05-20-2022 31 DS. Previous 04-20-2022 30 DS.  Ozempic 0.5 mg- Last filled 06-10-2022 28 DS. Previous 05-20-2022 28 DS.  [place] Pharmacist Assistant [phone]

## 2022-06-19 ENCOUNTER — Telehealth: Payer: Medicare Other

## 2022-06-19 ENCOUNTER — Telehealth: Payer: Self-pay

## 2022-06-19 ENCOUNTER — Other Ambulatory Visit: Payer: Self-pay | Admitting: Family Medicine

## 2022-06-19 DIAGNOSIS — E1142 Type 2 diabetes mellitus with diabetic polyneuropathy: Secondary | ICD-10-CM

## 2022-06-19 NOTE — Telephone Encounter (Signed)
  Care Management   Follow Up Note   06/19/2022 Name: Brent Ortiz MRN: [id number] DOB: 01-03-1961   Referred by: [name], MD Reason for referral : Chronic Care Management   An unsuccessful telephone outreach was attempted today. The patient was referred to the case management team for assistance with care management and care coordination.   Follow Up Plan:  Called patient and his daughter. Patient's phone VM box full. Left VM for daughter. Will have CCM team try again  [place] Constable, Pharm.D. - [phone]

## 2022-06-20 DIAGNOSIS — G4733 Obstructive sleep apnea (adult) (pediatric): Secondary | ICD-10-CM | POA: Diagnosis not present

## 2022-06-22 ENCOUNTER — Telehealth: Payer: Self-pay

## 2022-06-22 NOTE — Chronic Care Management (AMB) (Signed)
06-22-2022: Rescheduled patient's missed appointment on 06-19-2022 with Arizona Constable to 07-02-2022. Left patient's daughter a VM to see if she would be available to be on the phone during appointment.   [place] Pharmacist Assistant [phone]

## 2022-06-24 ENCOUNTER — Ambulatory Visit: Payer: Self-pay

## 2022-06-24 ENCOUNTER — Encounter: Payer: Self-pay | Admitting: Nurse Practitioner

## 2022-06-24 ENCOUNTER — Ambulatory Visit (INDEPENDENT_AMBULATORY_CARE_PROVIDER_SITE_OTHER): Payer: Medicare Other | Admitting: Nurse Practitioner

## 2022-06-24 DIAGNOSIS — M1A09X0 Idiopathic chronic gout, multiple sites, without tophus (tophi): Secondary | ICD-10-CM

## 2022-06-24 HISTORY — DX: Idiopathic chronic gout, multiple sites, without tophus (tophi): M1A.09X0

## 2022-06-24 MED ORDER — TRIAMCINOLONE ACETONIDE 40 MG/ML IJ SUSP
80.0000 mg | Freq: Once | INTRAMUSCULAR | Status: AC
  Administered 2022-06-24: 80 mg via INTRAMUSCULAR

## 2022-06-24 MED ORDER — PREDNISONE 10 MG PO TABS: ORAL_TABLET | ORAL | 0 refills | Status: AC

## 2022-06-24 NOTE — Progress Notes (Deleted)
Acute Office Visit  Subjective:    Patient ID: Brent Ortiz, male    DOB: 04/25/1961, 61 y.o.   MRN: [id number]  CHIEF COMPLAINT:  HPI:  Onset: Patient is in the clinic with a complain of   Location/Duration:   Timing and severity:  Aggravating factors:  Relieving factors:   Used measures:    Past Medical History:  Diagnosis Date   Acute combined systolic and diastolic CHF, NYHA class 2 ([place]) 01/22/2020   Acute gout of left elbow 02/14/2022   Acute idiopathic gout of right wrist 12/29/2021   Alcohol abuse    Atherosclerosis of coronary artery of native heart with stable angina pectoris, unspecified vessel or lesion type ([place]) 01/23/2016   Overview:  Completely occluded circumflex artery proximal to obtuse marginal 1 basin cardiac catheter from 2017   Atherosclerosis of native coronary artery of native heart without angina pectoris 01/23/2016   Overview:  Completely occluded circumflex artery proximal to obtuse marginal 1 basin cardiac catheter from 2017   Bilateral hip pain 11/17/2021   BMI 45.0-49.9, adult ([place]) 01/13/2013   Cellulitis of right upper extremity 01/01/2022   Centrilobular emphysema ([place]) 01/16/2022   Chronic kidney disease    Chronic pain 10/19/2019   Sees pain clinic   COPD (chronic obstructive pulmonary disease) ([place])    COPD exacerbation ([place]) 10/03/2019   Coronary artery disease involving native heart 02/08/2020   Coronary atherosclerosis of native coronary artery 01/23/2016   Overview:  Completely occluded circumflex artery proximal to obtuse marginal 1 basin cardiac catheter from 2017   Depression, major, single episode, moderate ([place]) 07/11/2020   Diabetic polyneuropathy ([place]) 12/06/2019   ED (erectile dysfunction) 12/06/2019   Essential hypertension 01/23/2016   Essential thrombocythemia ([place]) 09/29/2011   GERD (gastroesophageal reflux disease) 11/17/2021   GERD without esophagitis    HFrEF (heart failure with reduced ejection fraction) ([place])  02/08/2020   Knee pain, left 02/12/2012   Formatting of this note might be different from the original. Left   Leukocytosis 09/04/2011   Lumbar pain 11/17/2021   Mild major depression, single episode ([place]) 02/14/2022   Mixed hyperlipidemia 06/18/2017   Mixed incontinence 02/24/2022   Morbid obesity ([place]) 11/21/2020   Morbid obesity with BMI of 40.0-44.9, adult ([place]) 01/13/2013   Myocardial infarction ([place])    04-13-2011   Obstructive chronic bronchitis with exacerbation ([place]) 10/03/2019   Obstructive sleep apnea 07/18/2011   Olecranon bursitis of left elbow 02/14/2022   Olecranon bursitis of left elbow 02/14/2022   Opiate abuse, continuous ([facility])    Osteoarthritis    Pain in knee joint 02/12/2012   Formatting of this note might be different from the original. Left   Personal history of stroke with residual effects 07/24/2021   Pneumonia    Polysubstance abuse ([place])    Poor dentition    Stage 3b chronic kidney disease ([place]) 12/27/2020   Thrombocytosis 09/29/2011   Tobacco dependence 01/23/2016   Tobacco use disorder 01/23/2016   Type 2 diabetes mellitus with diabetic polyneuropathy [facility])     Past Surgical History:  Procedure Laterality Date   HERNIA REPAIR  2009   I & D KNEE WITH POLY EXCHANGE  09/03/2011   Procedure: IRRIGATION AND DEBRIDEMENT KNEE WITH POLY EXCHANGE;  Surgeon: Mauri Pole, MD;  Location: [facility];  Service: Orthopedics;  Laterality: Left;   TOOTH EXTRACTION Bilateral 08/06/2017   Procedure: DENTAL RESTORATION/EXTRACTIONS;  Surgeon: Diona Browner, DDS;  Location: [facility];  Service: Oral Surgery;  Laterality: Bilateral;  TOTAL KNEE ARTHROPLASTY   right  feb 2012   left march 2012 r   TOTAL KNEE REVISION  06/23/2011   Procedure: TOTAL KNEE REVISION;  Surgeon: Mauri Pole;  Location: [facility];  Service: Orthopedics;  Laterality: Left;  femomal nerve block done in holding area without incident    Family History  Problem Relation Age of Onset   Colon cancer Other     Colon cancer Mother     Social History   Socioeconomic History   Marital status: Widowed    Spouse name: Not on file   Number of children: 5   Years of education: Not on file   Highest education level: Not on file  Occupational History   Occupation: Disabled  Tobacco Use   Smoking status: Every Day    Packs/day: 1.00    Years: 50.00    Total pack years: 50.00    Types: Cigarettes   Smokeless tobacco: Never  Vaping Use   Vaping Use: Former  Substance and Sexual Activity   Alcohol use: Not Currently    Comment: heavy drinker in past. occasionally has a beer. Not on regular basis.   Drug use: Not Currently   Sexual activity: Not Currently  Other Topics Concern   Not on file  Social History Narrative   Not on file   Social Determinants of Health   Financial Resource Strain: Low Risk  (05/13/2022)   Overall Financial Resource Strain (CARDIA)    Difficulty of Paying Living Expenses: Not hard at all  Food Insecurity: No Food Insecurity (05/13/2022)   Hunger Vital Sign    Worried About Running Out of Food in the Last Year: Never true    Ran Out of Food in the Last Year: Never true  Transportation Needs: No Transportation Needs (05/13/2022)   PRAPARE - Hydrologist (Medical): No    Lack of Transportation (Non-Medical): No  Physical Activity: Inactive (05/13/2022)   Exercise Vital Sign    Days of Exercise per Week: 0 days    Minutes of Exercise per Session: 0 min  Stress: No Stress Concern Present (05/13/2022)   [place]    Feeling of Stress : Not at all  Social Connections: Socially Isolated (05/13/2022)   Social Connection and Isolation Panel [NHANES]    Frequency of Communication with Friends and Family: More than three times a week    Frequency of Social Gatherings with Friends and Family: More than three times a week    Attends Religious Services: Never    Corporate treasurer or Organizations: No    Attends Archivist Meetings: Never    Marital Status: Widowed  Intimate Partner Violence: Not At Risk (05/13/2022)   Humiliation, Afraid, Rape, and Kick questionnaire    Fear of Current or Ex-Partner: No    Emotionally Abused: No    Physically Abused: No    Sexually Abused: No    Outpatient Medications Prior to Visit  Medication Sig Dispense Refill   Accu-Chek Softclix Lancets lancets Use as instructed 100 each 5   albuterol (VENTOLIN HFA) 108 (90 Base) MCG/ACT inhaler INHALE 2 PUFFS INTO THE LUNGS EVERY 4 HOURS AS NEEDED FOR WHEEZING OR SHORTNESS OF BREATH. 8.5 g 6   allopurinol (ZYLOPRIM) 100 MG tablet TAKE 1 TABLET BY MOUTH ONCE DAILY (WHITE ROUND TABLET WITH 209) 31 tablet 6   amLODipine (NORVASC) 5 MG tablet Take 5 mg by  mouth daily.     ammonium lactate (LAC-HYDRIN) 12 % lotion Apply 1 Application topically as needed for dry skin. 400 g 5   aspirin EC (GOODSENSE ASPIRIN LOW DOSE) 81 MG tablet TAKE 1 TABLET (81MG) BY MOUTH DAILY. SWALLOW WHOLE.(YELLOW ROUND TAB WITH A HEART) 93 tablet 0   Blood Glucose Monitoring Suppl (ACCU-CHEK GUIDE) w/Device KIT 1 each by Does not apply route in the morning, at noon, and at bedtime. 1 kit 0   clopidogrel (PLAVIX) 75 MG tablet TAKE 1 TABLET BY MOUTH ONCE DAILY (ROUND PINK TABLET WITH E 34) 31 tablet 6   colchicine 0.6 MG tablet TAKE 1 TABLET(0.6 MG) BY MOUTH TWICE DAILY FOR 7 DAYS THEN TAKE 1 TABLET(0.6 MG) BY MOUTH DAILY FOR 23 DAYS 180 tablet 0   ezetimibe (ZETIA) 10 MG tablet Take 10 mg by mouth daily.     famotidine (PEPCID) 20 MG tablet Take 20 mg by mouth 2 (two) times daily.     gabapentin (NEURONTIN) 300 MG capsule TAKE 1 CAPSULE (300MG) BY MOUTH TWICE DAILY.(YELLOW CAP) 186 capsule 1   Glucagon, rDNA, (GLUCAGON EMERGENCY) 1 MG KIT Inject 1 mg into the muscle as needed (BG less than 70).     glucose blood (ACCU-CHEK GUIDE) test strip Use as instructed 100 each 5   isosorbide mononitrate (IMDUR) 60 MG  24 hr tablet Take 60 mg by mouth daily.     LANTUS SOLOSTAR 100 UNIT/ML Solostar Pen ADMINISTER 30 UNITS UNDER THE SKIN DAILY 15 mL 3   metFORMIN (GLUCOPHAGE) 500 MG tablet TAKE 1 TABLET BY MOUTH EVERY DAY WITH SUPPER.(ROUND WHITE TAB WITH G10, or LL) 93 tablet 2   metoprolol succinate (TOPROL-XL) 25 MG 24 hr tablet Take 1 tablet (25 mg total) by mouth daily. 31 tablet 3   nicotine (NICODERM CQ - DOSED IN MG/24 HR) 7 mg/24hr patch APPLY 1 PATCH ONTO THE SKIN ONCE DAILY ** DISCARD PROPERLY 30 patch 3   nitroGLYCERIN (NITROSTAT) 0.4 MG SL tablet DISSOLVE 1 TABLET UNDER TONGUE EVERY 5 MINUTES AS NEEDED CHEST PAIN. 25 tablet 1   oxyCODONE-acetaminophen (PERCOCET/ROXICET) 5-325 MG tablet Take 1 tablet by mouth every 6 (six) hours as needed for severe pain. 120 tablet 0   pantoprazole (PROTONIX) 40 MG tablet Take 1 tablet (40 mg total) by mouth 2 (two) times daily. 180 tablet 0   polyethylene glycol (MIRALAX / GLYCOLAX) 17 g packet Take 17 g by mouth daily.     ranolazine (RANEXA) 500 MG 12 hr tablet TAKE 1 TABLET BY MOUTH 2 TIMES DAILY (PEACH COLORED TABLET WITH C49 OR 588) 186 tablet 0   rosuvastatin (CRESTOR) 40 MG tablet Take 1 tablet (40 mg total) by mouth daily. 93 tablet 2   sacubitril-valsartan (ENTRESTO) 24-26 MG TAKE 1 TABLET BY MOUTH 2 TIMES DAILY.(OBLONG PINK TAB WITH LZ NVR) 186 tablet 0   Semaglutide,0.25 or 0.5MG/DOS, (OZEMPIC, 0.25 OR 0.5 MG/DOSE,) 2 MG/3ML SOPN INJECT 0.5MG INTO THE SKIN ONCE A WEEK. 3 mL 0   sertraline (ZOLOFT) 25 MG tablet TAKE 1 TABLET (25MG) BY MOUTH DAILY.(OBLONG GREEN TABLET WITH A 16) 93 tablet 1   spironolactone (ALDACTONE) 25 MG tablet TAKE 1/2 TABLET(12.5) BY MOUTH DAILY. 45 tablet 3   tamsulosin (FLOMAX) 0.4 MG CAPS capsule TAKE 1 CAPSULE (.4MG TOTAL) BY MOUTH DAILY.(GREEN AND GOLD CAPSULE WITH W 516) 93 capsule 3   traZODone (DESYREL) 150 MG tablet TAKE 1 TABLET(150MG) BY MOUTH AT BEDTIME (OVAL WHITE TABLET WITH 8 07, or 13 32 &  50 50 50) 93 tablet 1   TRELEGY  ELLIPTA 100-62.5-25 MCG/ACT AEPB INHALE 1 PUFF ONCE DAILY 60 each 2   ULTICARE SHORT PEN NEEDLES 31G X 8 MM MISC USE AS DIRECTED (ONCE DAILY WITH LANTUS INJECTION AS INSTRUCTED) 100 each 4   Facility-Administered Medications Prior to Visit  Medication Dose Route Frequency Provider Last Rate Last Admin   triamcinolone acetonide (KENALOG-40) injection 20 mg  20 mg Intra-articular Once [name], Kirsten, MD        Allergies  Allergen Reactions   Motrin [Ibuprofen] Other (See Comments)    Reaction: ulcers   Other Nausea And Vomiting    Patient has ulcers    Review of Systems     Objective:    Physical Exam  Temp (!) 97.4 F (36.3 C)   Ht _0  (1.727 m)   Wt 296 lb (134.3 kg)   SpO2 97%   BMI 45.01 kg/m  Wt Readings from Last 3 Encounters:  06/24/22 296 lb (134.3 kg)  03/30/22 (!) 301 lb (136.5 kg)  03/12/22 (!) 302 lb 12.8 oz (137.3 kg)    Health Maintenance Due  Topic Date Due   HIV Screening  Never done   DTaP/Tdap/Td (1 - Tdap) Never done   Zoster Vaccines- Shingrix (2 of 2) 07/02/2019   OPHTHALMOLOGY EXAM  11/12/2021   COVID-19 Vaccine (4 - 2023-24 season) 03/06/2022    There are no preventive care reminders to display for this patient.   Lab Results  Component Value Date   TSH 0.719 01/24/2021   Lab Results  Component Value Date   WBC 11.8 (H) 03/30/2022   HGB 14.5 03/30/2022   HCT 42.0 03/30/2022   MCV 92 03/30/2022   PLT 391 03/30/2022   Lab Results  Component Value Date   NA 135 03/30/2022   K 4.7 03/30/2022   CO2 23 03/30/2022   GLUCOSE 189 (H) 03/30/2022   BUN 14 03/30/2022   CREATININE 1.12 03/30/2022   BILITOT 0.4 03/30/2022   ALKPHOS 53 03/30/2022   AST 10 03/30/2022   ALT 12 03/30/2022   PROT 6.7 03/30/2022   ALBUMIN 4.3 03/30/2022   CALCIUM 10.1 03/30/2022   ANIONGAP 11 08/06/2017   EGFR 74 03/30/2022   Lab Results  Component Value Date   CHOL 240 (H) 03/30/2022   Lab Results  Component Value Date   HDL 73 03/30/2022   Lab  Results  Component Value Date   LDLCALC 142 (H) 03/30/2022   Lab Results  Component Value Date   TRIG 141 03/30/2022   Lab Results  Component Value Date   CHOLHDL 3.3 03/30/2022   Lab Results  Component Value Date   HGBA1C 9.3 (H) 03/30/2022       Assessment & Plan:   Problem List Items Addressed This Visit   None  No orders of the defined types were placed in this encounter.   No orders of the defined types were placed in this encounter.    Follow-up: No follow-ups on file.  An After Visit Summary was printed and given to the patient.  Neil Crouch, DNP, FNP-BC, FNP-C [name] [phone]

## 2022-06-24 NOTE — Progress Notes (Signed)
Acute Office Visit  Subjective:    Patient ID: Brent Ortiz, male    DOB: 1960/11/26, 61 y.o.   MRN: [id number]  Chief Complaint  Patient presents with   Foot Pain    HPI: Patient is in today for left foot pain x 4 days, c/o difficulty with walking due to pain, has bright red swollen area below left great toe. Pain is throbbing and constant. Has hx of gout flare ups. Reports taking both allopurinol and colchicine daily. Patient is taking 4 of the percocet everyday for osteoarthritis.He wants something for pain.  Past Medical History:  Diagnosis Date   Acute combined systolic and diastolic CHF, NYHA class 2 ([place]) 01/22/2020   Acute gout of left elbow 02/14/2022   Acute idiopathic gout of right wrist 12/29/2021   Alcohol abuse    Atherosclerosis of coronary artery of native heart with stable angina pectoris, unspecified vessel or lesion type ([place]) 01/23/2016   Overview:  Completely occluded circumflex artery proximal to obtuse marginal 1 basin cardiac catheter from 2017   Atherosclerosis of native coronary artery of native heart without angina pectoris 01/23/2016   Overview:  Completely occluded circumflex artery proximal to obtuse marginal 1 basin cardiac catheter from 2017   Bilateral hip pain 11/17/2021   BMI 45.0-49.9, adult ([place]) 01/13/2013   Cellulitis of right upper extremity 01/01/2022   Centrilobular emphysema ([place]) 01/16/2022   Chronic kidney disease    Chronic pain 10/19/2019   Sees pain clinic   COPD (chronic obstructive pulmonary disease) ([place])    COPD exacerbation ([employer]) 10/03/2019   Coronary artery disease involving native heart 02/08/2020   Coronary atherosclerosis of native coronary artery 01/23/2016   Overview:  Completely occluded circumflex artery proximal to obtuse marginal 1 basin cardiac catheter from 2017   Depression, major, single episode, moderate ([place]) 07/11/2020   Diabetic polyneuropathy ([place]) 12/06/2019   ED (erectile dysfunction) 12/06/2019   Essential  hypertension 01/23/2016   Essential thrombocythemia ([place]) 09/29/2011   GERD (gastroesophageal reflux disease) 11/17/2021   GERD without esophagitis    HFrEF (heart failure with reduced ejection fraction) ([place]) 02/08/2020   Knee pain, left 02/12/2012   Formatting of this note might be different from the original. Left   Leukocytosis 09/04/2011   Lumbar pain 11/17/2021   Mild major depression, single episode ([place]) 02/14/2022   Mixed hyperlipidemia 06/18/2017   Mixed incontinence 02/24/2022   Morbid obesity ([place]) 11/21/2020   Morbid obesity with BMI of 40.0-44.9, adult ([place]) 01/13/2013   Myocardial infarction ([place])    04-13-2011   Obstructive chronic bronchitis with exacerbation ([place]) 10/03/2019   Obstructive sleep apnea 07/18/2011   Olecranon bursitis of left elbow 02/14/2022   Olecranon bursitis of left elbow 02/14/2022   Opiate abuse, continuous ([facility])    Osteoarthritis    Pain in knee joint 02/12/2012   Formatting of this note might be different from the original. Left   Personal history of stroke with residual effects 07/24/2021   Pneumonia    Polysubstance abuse ([place])    Poor dentition    Stage 3b chronic kidney disease ([place]) 12/27/2020   Thrombocytosis 09/29/2011   Tobacco dependence 01/23/2016   Tobacco use disorder 01/23/2016   Type 2 diabetes mellitus with diabetic polyneuropathy [facility])     Past Surgical History:  Procedure Laterality Date   HERNIA REPAIR  2009   I & D KNEE WITH POLY EXCHANGE  09/03/2011   Procedure: IRRIGATION AND DEBRIDEMENT KNEE WITH POLY EXCHANGE;  Surgeon: Mauri Pole,  MD;  Location: [facility];  Service: Orthopedics;  Laterality: Left;   TOOTH EXTRACTION Bilateral 08/06/2017   Procedure: DENTAL RESTORATION/EXTRACTIONS;  Surgeon: Diona Browner, DDS;  Location: [place];  Service: Oral Surgery;  Laterality: Bilateral;   TOTAL KNEE ARTHROPLASTY   right  feb 2012   left march 2012 r   TOTAL KNEE REVISION  06/23/2011   Procedure: TOTAL KNEE REVISION;  Surgeon:  Mauri Pole;  Location: [facility];  Service: Orthopedics;  Laterality: Left;  femomal nerve block done in holding area without incident    Family History  Problem Relation Age of Onset   Colon cancer Other    Colon cancer Mother     Social History   Socioeconomic History   Marital status: Widowed    Spouse name: Not on file   Number of children: 5   Years of education: Not on file   Highest education level: Not on file  Occupational History   Occupation: Disabled  Tobacco Use   Smoking status: Every Day    Packs/day: 1.00    Years: 50.00    Total pack years: 50.00    Types: Cigarettes   Smokeless tobacco: Never  Vaping Use   Vaping Use: Former  Substance and Sexual Activity   Alcohol use: Not Currently    Comment: heavy drinker in past. occasionally has a beer. Not on regular basis.   Drug use: Not Currently   Sexual activity: Not Currently  Other Topics Concern   Not on file  Social History Narrative   Not on file   Social Determinants of Health   Financial Resource Strain: Low Risk  (05/13/2022)   Overall Financial Resource Strain (CARDIA)    Difficulty of Paying Living Expenses: Not hard at all  Food Insecurity: No Food Insecurity (05/13/2022)   Hunger Vital Sign    Worried About Running Out of Food in the Last Year: Never true    Ran Out of Food in the Last Year: Never true  Transportation Needs: No Transportation Needs (05/13/2022)   PRAPARE - Hydrologist (Medical): No    Lack of Transportation (Non-Medical): No  Physical Activity: Inactive (05/13/2022)   Exercise Vital Sign    Days of Exercise per Week: 0 days    Minutes of Exercise per Session: 0 min  Stress: No Stress Concern Present (05/13/2022)   [place]    Feeling of Stress : Not at all  Social Connections: Socially Isolated (05/13/2022)   Social Connection and Isolation Panel [NHANES]    Frequency of  Communication with Friends and Family: More than three times a week    Frequency of Social Gatherings with Friends and Family: More than three times a week    Attends Religious Services: Never    Marine scientist or Organizations: No    Attends Archivist Meetings: Never    Marital Status: Widowed  Intimate Partner Violence: Not At Risk (05/13/2022)   Humiliation, Afraid, Rape, and Kick questionnaire    Fear of Current or Ex-Partner: No    Emotionally Abused: No    Physically Abused: No    Sexually Abused: No    Outpatient Medications Prior to Visit  Medication Sig Dispense Refill   Accu-Chek Softclix Lancets lancets Use as instructed 100 each 5   albuterol (VENTOLIN HFA) 108 (90 Base) MCG/ACT inhaler INHALE 2 PUFFS INTO THE LUNGS EVERY 4 HOURS AS NEEDED FOR WHEEZING  OR SHORTNESS OF BREATH. 8.5 g 6   allopurinol (ZYLOPRIM) 100 MG tablet TAKE 1 TABLET BY MOUTH ONCE DAILY (WHITE ROUND TABLET WITH 209) 31 tablet 6   amLODipine (NORVASC) 5 MG tablet Take 5 mg by mouth daily.     ammonium lactate (LAC-HYDRIN) 12 % lotion Apply 1 Application topically as needed for dry skin. 400 g 5   aspirin EC (GOODSENSE ASPIRIN LOW DOSE) 81 MG tablet TAKE 1 TABLET (81MG) BY MOUTH DAILY. SWALLOW WHOLE.(YELLOW ROUND TAB WITH A HEART) 93 tablet 0   Blood Glucose Monitoring Suppl (ACCU-CHEK GUIDE) w/Device KIT 1 each by Does not apply route in the morning, at noon, and at bedtime. 1 kit 0   clopidogrel (PLAVIX) 75 MG tablet TAKE 1 TABLET BY MOUTH ONCE DAILY (ROUND PINK TABLET WITH E 34) 31 tablet 6   colchicine 0.6 MG tablet TAKE 1 TABLET(0.6 MG) BY MOUTH TWICE DAILY FOR 7 DAYS THEN TAKE 1 TABLET(0.6 MG) BY MOUTH DAILY FOR 23 DAYS 180 tablet 0   ezetimibe (ZETIA) 10 MG tablet Take 10 mg by mouth daily.     famotidine (PEPCID) 20 MG tablet Take 20 mg by mouth 2 (two) times daily.     gabapentin (NEURONTIN) 300 MG capsule TAKE 1 CAPSULE (300MG) BY MOUTH TWICE DAILY.(YELLOW CAP) 186 capsule 1    Glucagon, rDNA, (GLUCAGON EMERGENCY) 1 MG KIT Inject 1 mg into the muscle as needed (BG less than 70).     glucose blood (ACCU-CHEK GUIDE) test strip Use as instructed 100 each 5   isosorbide mononitrate (IMDUR) 60 MG 24 hr tablet Take 60 mg by mouth daily.     LANTUS SOLOSTAR 100 UNIT/ML Solostar Pen ADMINISTER 30 UNITS UNDER THE SKIN DAILY 15 mL 3   metFORMIN (GLUCOPHAGE) 500 MG tablet TAKE 1 TABLET BY MOUTH EVERY DAY WITH SUPPER.(ROUND WHITE TAB WITH G10, or LL) 93 tablet 2   metoprolol succinate (TOPROL-XL) 25 MG 24 hr tablet Take 1 tablet (25 mg total) by mouth daily. 31 tablet 3   nicotine (NICODERM CQ - DOSED IN MG/24 HR) 7 mg/24hr patch APPLY 1 PATCH ONTO THE SKIN ONCE DAILY ** DISCARD PROPERLY 30 patch 3   nitroGLYCERIN (NITROSTAT) 0.4 MG SL tablet DISSOLVE 1 TABLET UNDER TONGUE EVERY 5 MINUTES AS NEEDED CHEST PAIN. 25 tablet 1   oxyCODONE-acetaminophen (PERCOCET/ROXICET) 5-325 MG tablet Take 1 tablet by mouth every 6 (six) hours as needed for severe pain. 120 tablet 0   pantoprazole (PROTONIX) 40 MG tablet Take 1 tablet (40 mg total) by mouth 2 (two) times daily. 180 tablet 0   polyethylene glycol (MIRALAX / GLYCOLAX) 17 g packet Take 17 g by mouth daily.     ranolazine (RANEXA) 500 MG 12 hr tablet TAKE 1 TABLET BY MOUTH 2 TIMES DAILY (PEACH COLORED TABLET WITH C49 OR 588) 186 tablet 0   rosuvastatin (CRESTOR) 40 MG tablet Take 1 tablet (40 mg total) by mouth daily. 93 tablet 2   sacubitril-valsartan (ENTRESTO) 24-26 MG TAKE 1 TABLET BY MOUTH 2 TIMES DAILY.(OBLONG PINK TAB WITH LZ NVR) 186 tablet 0   Semaglutide,0.25 or 0.5MG/DOS, (OZEMPIC, 0.25 OR 0.5 MG/DOSE,) 2 MG/3ML SOPN INJECT 0.5MG INTO THE SKIN ONCE A WEEK. 3 mL 0   sertraline (ZOLOFT) 25 MG tablet TAKE 1 TABLET (25MG) BY MOUTH DAILY.(OBLONG GREEN TABLET WITH A 16) 93 tablet 1   spironolactone (ALDACTONE) 25 MG tablet TAKE 1/2 TABLET(12.5) BY MOUTH DAILY. 45 tablet 3   tamsulosin (FLOMAX) 0.4 MG CAPS capsule TAKE  1 CAPSULE (.4MG  TOTAL) BY MOUTH DAILY.(GREEN AND GOLD CAPSULE WITH W 516) 93 capsule 3   traZODone (DESYREL) 150 MG tablet TAKE 1 TABLET(150MG) BY MOUTH AT BEDTIME (OVAL WHITE TABLET WITH 8 07, or 13 32 & 50 50 50) 93 tablet 1   TRELEGY ELLIPTA 100-62.5-25 MCG/ACT AEPB INHALE 1 PUFF ONCE DAILY 60 each 2   ULTICARE SHORT PEN NEEDLES 31G X 8 MM MISC USE AS DIRECTED (ONCE DAILY WITH LANTUS INJECTION AS INSTRUCTED) 100 each 4   Facility-Administered Medications Prior to Visit  Medication Dose Route Frequency Provider Last Rate Last Admin   triamcinolone acetonide (KENALOG-40) injection 20 mg  20 mg Intra-articular Once [name], Kirsten, MD        Allergies  Allergen Reactions   Motrin [Ibuprofen] Other (See Comments)    Reaction: ulcers   Other Nausea And Vomiting    Patient has ulcers    Review of Systems  Constitutional:  Negative for appetite change.  HENT:  Negative for congestion.   Eyes:  Negative for discharge.  Respiratory:  Negative for chest tightness.   Endocrine: Positive for polydipsia, polyphagia and polyuria. Negative for cold intolerance.  Genitourinary:  Negative for difficulty urinating.  Musculoskeletal:  Positive for joint swelling (left great toe).  Neurological:  Negative for headaches.       Objective:    Physical Exam Vitals reviewed.  Constitutional:      Appearance: Normal appearance.  HENT:     Right Ear: Tympanic membrane normal.     Left Ear: Tympanic membrane normal.     Nose: Nose normal.     Mouth/Throat:     Pharynx: No oropharyngeal exudate or posterior oropharyngeal erythema.  Eyes:     Conjunctiva/sclera: Conjunctivae normal.  Neck:     Vascular: No carotid bruit.  Cardiovascular:     Rate and Rhythm: Normal rate and regular rhythm.     Pulses: Normal pulses.     Heart sounds: Normal heart sounds.  Pulmonary:     Effort: Pulmonary effort is normal.     Breath sounds: Normal breath sounds.  Abdominal:     General: Bowel sounds are normal.      Palpations: There is no mass.     Tenderness: There is no abdominal tenderness.  Musculoskeletal:        General: Swelling and tenderness present.     Cervical back: Normal range of motion.     Comments: Left great toe painful,  warmth, erythematous, swollen, reduced ROM  Feet:     Right foot:     Skin integrity: Callus and dry skin present. No erythema or warmth.     Left foot:     Skin integrity: Erythema, warmth, callus and dry skin present.  Skin:    Findings: No lesion.  Neurological:     Mental Status: He is alert and oriented to person, place, and time.  Psychiatric:        Mood and Affect: Mood normal.        Behavior: Behavior normal.     Temp (!) 97.4 F (36.3 C)   Ht _0  (1.727 m)   Wt 296 lb (134.3 kg)   SpO2 97%   BMI 45.01 kg/m  Wt Readings from Last 3 Encounters:  06/24/22 296 lb (134.3 kg)  03/30/22 (!) 301 lb (136.5 kg)  03/12/22 (!) 302 lb 12.8 oz (137.3 kg)    Health Maintenance Due  Topic Date Due   HIV Screening  Never  done   DTaP/Tdap/Td (1 - Tdap) Never done   Zoster Vaccines- Shingrix (2 of 2) 07/02/2019   OPHTHALMOLOGY EXAM  11/12/2021   COVID-19 Vaccine (4 - 2023-24 season) 03/06/2022    There are no preventive care reminders to display for this patient.   Lab Results  Component Value Date   TSH 0.719 01/24/2021   Lab Results  Component Value Date   WBC 11.8 (H) 03/30/2022   HGB 14.5 03/30/2022   HCT 42.0 03/30/2022   MCV 92 03/30/2022   PLT 391 03/30/2022   Lab Results  Component Value Date   NA 135 03/30/2022   K 4.7 03/30/2022   CO2 23 03/30/2022   GLUCOSE 189 (H) 03/30/2022   BUN 14 03/30/2022   CREATININE 1.12 03/30/2022   BILITOT 0.4 03/30/2022   ALKPHOS 53 03/30/2022   AST 10 03/30/2022   ALT 12 03/30/2022   PROT 6.7 03/30/2022   ALBUMIN 4.3 03/30/2022   CALCIUM 10.1 03/30/2022   ANIONGAP 11 08/06/2017   EGFR 74 03/30/2022   Lab Results  Component Value Date   CHOL 240 (H) 03/30/2022   Lab Results   Component Value Date   HDL 73 03/30/2022   Lab Results  Component Value Date   LDLCALC 142 (H) 03/30/2022   Lab Results  Component Value Date   TRIG 141 03/30/2022   Lab Results  Component Value Date   CHOLHDL 3.3 03/30/2022   Lab Results  Component Value Date   HGBA1C 9.3 (H) 03/30/2022      Assessment & Plan:   Problem List Items Addressed This Visit       Other   Idiopathic chronic gout of multiple sites without tophus    On allopurinol and colchicine. Sent Prescription for prednisone taper. Kenalog 80 mg IM given at clinic. Serum uric acid lab is drawn today  Instructions given to reduce future Gout attack: Eat a low-purine diet. Avoid foods and drinks such as: Liver. Kidney. Anchovies. Asparagus. Herring. Mushrooms. Mussels. Beer. Stay at a healthy weight.      Relevant Medications   predniSONE (DELTASONE) 10 MG tablet   triamcinolone acetonide (KENALOG-40) injection 80 mg   Other Relevant Orders   Uric Acid     Follow-up:  as needed  I, Rekha Aryal have reviewed all documentation for this visit. The documentation on 06/24/22   for the exam, diagnosis, procedures, and orders are all accurate and complete.      An After Visit Summary was printed and given to the patient.  Neil Crouch, [place] [phone]

## 2022-06-24 NOTE — Assessment & Plan Note (Signed)
On allopurinol and colchicine. Sent Prescription for prednisone taper. Kenalog 80 mg IM given at clinic. Serum uric acid lab is drawn today  Instructions given to reduce future Gout attack: Eat a low-purine diet. Avoid foods and drinks such as: Liver. Kidney. Anchovies. Asparagus. Herring. Mushrooms. Mussels. Beer. Stay at a healthy weight.

## 2022-06-24 NOTE — Patient Instructions (Addendum)
Follow up if symptoms does not resolve after the course of  prednisone  Gout  Gout is painful swelling of your joints. Gout is a type of arthritis. It is caused by having too much uric acid in your body. Uric acid is a chemical that is made when your body breaks down substances called purines. If your body has too much uric acid, sharp crystals can form and build up in your joints. This causes pain and swelling. Gout attacks can happen quickly and be very painful (acute gout). Over time, the attacks can affect more joints and happen more often (chronic gout). What are the causes? Gout is caused by too much uric acid in your blood. This can happen because: Your kidneys do not remove enough uric acid from your blood. Your body makes too much uric acid. You eat too many foods that are high in purines. These foods include organ meats, some seafood, and beer. Trauma or stress can bring on an attack. What increases the risk? Having a family history of gout. Being male and middle-aged. Being male and having gone through menopause. Having an organ transplant. Taking certain medicines. Having certain conditions, such as: Being very overweight (obese). Lead poisoning. Kidney disease. A skin condition called psoriasis. Other risks include: Losing weight too quickly. Not having enough water in the body (being dehydrated). Drinking alcohol, especially beer. Drinking beverages that are sweetened with a type of sugar called fructose. What are the signs or symptoms? An attack of acute gout often starts at night and usually happens in just one joint. The most common place is the big toe. Other joints that may be affected include joints of the feet, ankle, knee, fingers, wrist, or elbow. Symptoms may include: Very bad pain. Warmth. Swelling. Stiffness. Tenderness. The affected joint may be very painful to touch. Shiny, red, or purple skin. Chills and fever. Chronic gout may cause symptoms more  often. More joints may be involved. You may also have white or yellow lumps (tophi) on your hands or feet or in other areas near your joints. How is this treated? Treatment for an acute attack may include medicines for pain and swelling, such as: NSAIDs, such as ibuprofen. Steroids taken by mouth or injected into a joint. Colchicine. This can be given by mouth or through an IV tube. Treatment to prevent future attacks may include: Taking small doses of NSAIDs or colchicine daily. Using a medicine that reduces uric acid levels in your blood, such as allopurinol. Making changes to your diet. You may need to see a food expert (dietitian) about what to eat and drink to prevent gout. Follow these instructions at home: During a gout attack  If told, put ice on the painful area. To do this: Put ice in a plastic bag. Place a towel between your skin and the bag. Leave the ice on for 20 minutes, 2-3 times a day. Take off the ice if your skin turns bright red. This is very important. If you cannot feel pain, heat, or cold, you have a greater risk of damage to the area. Raise the painful joint above the level of your heart as often as you can. Rest the joint as much as possible. If the joint is in your leg, you may be given crutches. Follow instructions from your doctor about what you cannot eat or drink. Avoiding future gout attacks Eat a low-purine diet. Avoid foods and drinks such as: Liver. Kidney. Anchovies. Asparagus. Herring. Mushrooms. Mussels. Beer. Stay at a  healthy weight. If you want to lose weight, talk with your doctor. Do not lose weight too fast. Start or continue an exercise plan as told by your doctor. Eating and drinking Avoid drinks sweetened by fructose. Drink enough fluids to keep your pee (urine) pale yellow. If you drink alcohol: Limit how much you have to: 0-1 drink a day for women who are not pregnant. 0-2 drinks a day for men. Know how much alcohol is in a  drink. In the [place] one drink equals one 12 oz bottle of beer (355 mL), one 5 oz glass of wine (148 mL), or one 1 oz glass of hard liquor (44 mL). General instructions Take over-the-counter and prescription medicines only as told by your doctor. Ask your doctor if you should avoid driving or using machines while you are taking your medicine. Return to your normal activities when your doctor says that it is safe. Keep all follow-up visits. Where to find more information [employer] [facility]: www.niams.SouthExposed.es Contact a doctor if: You have another gout attack. You still have symptoms of a gout attack after 10 days of treatment. You have problems (side effects) because of your medicines. You have chills or a fever. You have burning pain when you pee (urinate). You have pain in your lower back or belly. Get help right away if: You have very bad pain. Your pain cannot be controlled. You cannot pee. Summary Gout is painful swelling of the joints. The most common site of pain is the big toe, but it can affect other joints. Medicines and avoiding some foods can help to prevent and treat gout attacks. This information is not intended to replace advice given to you by your health care provider. Make sure you discuss any questions you have with your health care provider. Document Revised: 03/26/2021 Document Reviewed: 03/26/2021 Elsevier Patient Education  [place].

## 2022-06-24 NOTE — Patient Outreach (Signed)
  Care Coordination   Follow Up Visit Note   06/24/2022 Name: XADRIAN CRAIGHEAD MRN: [id number] DOB: May 29, 1961  CYLE KENYON is a 61 y.o. year old male who sees [name], Kirsten, MD for primary care. I spoke with  Ortencia Kick Lundberg by phone today.  What matters to the patients health and wellness today?  Patient reports that he is having problems with his left great toe for 5 days.  Reports redness up his leg. Reports pain 9/10 with no relief from pain medications.  Denies any broken skin. Reports CBG of 140.   Denies any injury. Reports that he feels like it is gout.     Goals Addressed               This Visit's Progress     Patient needs help with DM (pt-stated)        Care Coordination Interventions: Assessed concern and pain in the left great toe Reviewed use of pain medications Confirmed MD follow up appointment scheduled for today  Confirmed transportation.  Review DM control Encouraged patient to take his medications as prescribed.          SDOH assessments and interventions completed:  No     Care Coordination Interventions:  Yes, provided   Follow up plan: Follow up call scheduled for 07/29/2022    Encounter Outcome:  Pt. Visit Completed   Tomasa Rand, RN, BSN, CEN [place] Coordinator [phone]

## 2022-06-25 LAB — URIC ACID: Uric Acid: 4.4 mg/dL (ref 3.8–8.4)

## 2022-07-02 ENCOUNTER — Ambulatory Visit (INDEPENDENT_AMBULATORY_CARE_PROVIDER_SITE_OTHER): Payer: Medicare Other

## 2022-07-02 ENCOUNTER — Other Ambulatory Visit: Payer: Self-pay

## 2022-07-02 DIAGNOSIS — E1142 Type 2 diabetes mellitus with diabetic polyneuropathy: Secondary | ICD-10-CM

## 2022-07-02 DIAGNOSIS — E782 Mixed hyperlipidemia: Secondary | ICD-10-CM

## 2022-07-02 DIAGNOSIS — F320 Major depressive disorder, single episode, mild: Secondary | ICD-10-CM

## 2022-07-02 MED ORDER — OXYCODONE-ACETAMINOPHEN 5-325 MG PO TABS: 1.0000 | ORAL_TABLET | Freq: Four times a day (QID) | ORAL | 0 refills | Status: AC | PRN

## 2022-07-02 NOTE — Patient Instructions (Signed)
Visit Information   Goals Addressed   None    Patient Care Plan: CCM Pharmacy Care Plan     Problem Identified: DM, Cardio, Lipids, Pain   Priority: High  Onset Date: 04/04/2021     Long-Range Goal: Disease State Management   Start Date: 04/04/2021  Expected End Date: 04/04/2022  Recent Progress: On track  Priority: High  Note:   Current Barriers:  Does not adhere to prescribed medication regimen  Pharmacist Clinical Goal(s):  Patient will contact provider office for questions/concerns as evidenced notation of same in electronic health record through collaboration with PharmD and provider.   Interventions: 1:1 collaboration with [name], [name], MD regarding development and update of comprehensive plan of care as evidenced by provider attestation and co-signature Inter-disciplinary care team collaboration (see longitudinal plan of care) Comprehensive medication review performed; medication list updated in electronic medical record  HFrEF (BP goal <140/90) BP Readings from Last 3 Encounters:  06/24/22 122/84  06/11/22 (!) 165/101  03/30/22 136/86  -Uncontrolled -Current treatment: Amlodipine 26m (Never picked up) Query Appropriate,  Entresto 24/223mAppropriate, Effective, Safe, Accessible Spironolactone 2552mppropriate, Effective, Safe, Accessible Ranolazine 500m66mD Appropriate, Effective, Safe, Accessible Metoprol 25mg57mER Appropriate, Effective, Safe, Accessible ISMN 60mg 35mopriate, Effective, Safe, Accessible -Unable to classify due to not in Cardio note and patient cutting visit short -Medications previously tried: N/A  -Current home readings: December 2023: BP: 121/74 Pulse: 95 -Current dietary habits: "Tries to eat healthy" -Current exercise habits: None -Denies hypotensive/hypertensive symptoms -Educated on BP goals and benefits of medications for prevention of heart attack, stroke and kidney damage; -Counseled to monitor BP at home weekly, document, and  provide log at future appointments September 2022: Patient very non-compliant on meds. He didn't have them with him and wanted to cut his visit short because [place] [facility] [place] [facility] [place]. Rescheduled for next month and asked him to have daughter (Who manages meds) on visit so we can get his meds sorted out December 2023: Patient non-compliant on Amlodipine. Will let PCP know  Hyperlipidemia: (LDL goal < 70) Lab Results  Component Value Date   CHOL 240 (H) 03/30/2022   CHOL 193 12/29/2021   CHOL 233 (H) 09/24/2021   Lab Results  Component Value Date   HDL 73 03/30/2022   HDL 40 12/29/2021   HDL 61 09/24/2021   Lab Results  Component Value Date   LDLCALC 142 (H) 03/30/2022   LDLCALC 124 (H) 12/29/2021   LDLCALC 142 (H) 09/24/2021   Lab Results  Component Value Date   TRIG 141 03/30/2022   TRIG 163 (H) 12/29/2021   TRIG 168 (H) 09/24/2021   Lab Results  Component Value Date   CHOLHDL 3.3 03/30/2022   CHOLHDL 4.8 12/29/2021   CHOLHDL 3.8 09/24/2021  No results found for: "LDLDIRECT" -Controlled -Current treatment: Rosuvastatin 40mg A62mpriate, Query effective,  Ezetimibe (NOT TAKING) -Medications previously tried: N/A  -Current dietary patterns: "Tries to eat healthy" -Current exercise habits: None -Educated on Cholesterol goals;  September 2022: Defer to Cardio who said to maintain therapy. Will assess compliance next month since I have a feeling he isn't taking most of his meds December 2023: Non-compliant on Ezetimibe. Will let PCP know  Diabetes (A1c goal <7%) Lab Results  Component Value Date   HGBA1C 9.3 (H) 03/30/2022   HGBA1C 6.4 (H) 12/29/2021   HGBA1C 7.2 (H) 09/24/2021   Lab Results  Component Value Date   MICROALBUR 80 09/27/2020  LDLCALC 142 (H) 03/30/2022   CREATININE 1.12 03/30/2022   Lab Results  Component Value Date   NA 135 03/30/2022   K 4.7 03/30/2022   CREATININE 1.12 03/30/2022   EGFR 74 03/30/2022    GFRNONAA 68 06/26/2020   GLUCOSE 189 (H) 03/30/2022   Lab Results  Component Value Date   WBC 11.8 (H) 03/30/2022   HGB 14.5 03/30/2022   HCT 42.0 03/30/2022   MCV 92 03/30/2022   PLT 391 03/30/2022  -Uncontrolled -Current medications: Metformin 549m Appropriate, Query effective,  Ozempic 0.536mWed Appropriate, Query effective,  Lantus 30 Units Appropriate, Query effective,  -Medications previously tried: N/A  -Current home glucose readings fasting glucose: December 2023: 07/02/22: 143 Last week was 138, doesn't write down -Denies hypoglycemic/hyperglycemic symptoms -Current meal patterns:  breakfast: unable to assess today  lunch: unable to assess today  dinner: unable to assess today snacks: unable to assess today drinks: unable to assess today -Current exercise: None -Educated on A1c and blood sugar goals; -Counseled to check feet daily and get yearly eye exams September 2022: Non compliant on meds, will go over more with him and his daughter next month December 2023: Sugars almost at goal. Has A1c next week. Will wait to see results. Could increase Metformin if still high or Ozempic   Depression/Anxiety -Controlled -Current treatment: Sertraline 2567mppropriate, Effective, Safe, Accessible -Medications previously tried/failed: N/A -PHQ9:     06/24/2022    2:16 PM 05/13/2022   10:10 AM 03/30/2022   11:17 AM 02/10/2022    2:21 PM 12/29/2021    9:18 AM  Depression screen PHQ 2/9  Decreased Interest 0 0 0 1 2  Down, Depressed, Hopeless 0 0 0 1 2  PHQ - 2 Score 0 0 0 2 4  Altered sleeping 2   2 0  Tired, decreased energy _0 Change in appetite 0   0 2  Feeling bad or failure about yourself  0   2 0  Trouble concentrating 0   1 0  Moving slowly or fidgety/restless 0   1 2  Suicidal thoughts 0   0 0  PHQ-9 Score _1 Difficult doing work/chores Not difficult at all   Somewhat difficult Somewhat difficult  -GAD7:      No data to display         September 2022: Will assess more next month, possibly try SNRI depending on his pain as well October 2022: PHQ9 is fine  Tobacco use -1 PPD -Uncontrolled -Previous quit attempts: Unknown -Current treatment  None -Patient smokes Unknown -Patient triggers include: Unknown -On a scale of 1-10, reports MOTIVATION to quit is 1 -On a scale of 1-10, reports CONFIDENCE in quitting is 06 March 2021: Didn't speak with patient about this today, main priority was focusing on his daughter's safety, will re-evaluate next month October 2022: Not interested in quitting  Patient Goals/Self-Care Activities Patient will:  - take medications as prescribed  Follow Up Plan: The patient has been provided with contact information for the care management team and has been advised to call with any health related questions or concerns.    [facility] F/U Feb 2023  [place] - [phone]     Brent Ortiz was given information about Chronic Care Management services today including:  CCM service includes personalized support from designated clinical staff supervised by his physician, including individualized plan of care and coordination with other care  providers 24/7 contact phone numbers for assistance for urgent and routine care needs. Standard insurance, coinsurance, copays and deductibles apply for chronic care management only during months in which we provide at least 20 minutes of these services. Most insurances cover these services at 100%, however patients may be responsible for any copay, coinsurance and/or deductible if applicable. This service may help you avoid the need for more expensive face-to-face services. Only one practitioner may furnish and bill the service in a calendar month. The patient may stop CCM services at any time (effective at the end of the month) by phone call to the office staff.  Patient agreed to services and verbal consent obtained.   The patient  verbalized understanding of instructions, educational materials, and care plan provided today and DECLINED offer to receive copy of patient instructions, educational materials, and care plan.  The pharmacy team will reach out to the patient again over the next 60 days.   Lane Hacker, [place]
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
## 2022-07-05 ENCOUNTER — Other Ambulatory Visit: Payer: Self-pay | Admitting: Family Medicine

## 2022-07-05 DIAGNOSIS — F32A Depression, unspecified: Secondary | ICD-10-CM | POA: Diagnosis not present

## 2022-07-05 DIAGNOSIS — I5020 Unspecified systolic (congestive) heart failure: Secondary | ICD-10-CM

## 2022-07-05 DIAGNOSIS — E1142 Type 2 diabetes mellitus with diabetic polyneuropathy: Secondary | ICD-10-CM

## 2022-07-05 DIAGNOSIS — F17210 Nicotine dependence, cigarettes, uncomplicated: Secondary | ICD-10-CM

## 2022-07-05 DIAGNOSIS — E1159 Type 2 diabetes mellitus with other circulatory complications: Secondary | ICD-10-CM | POA: Diagnosis not present

## 2022-07-05 DIAGNOSIS — I509 Heart failure, unspecified: Secondary | ICD-10-CM | POA: Diagnosis not present

## 2022-07-05 DIAGNOSIS — E785 Hyperlipidemia, unspecified: Secondary | ICD-10-CM

## 2022-07-05 DIAGNOSIS — J432 Centrilobular emphysema: Secondary | ICD-10-CM

## 2022-07-05 DIAGNOSIS — I10 Essential (primary) hypertension: Secondary | ICD-10-CM

## 2022-07-08 ENCOUNTER — Other Ambulatory Visit: Payer: Self-pay | Admitting: Family Medicine

## 2022-07-08 DIAGNOSIS — J449 Chronic obstructive pulmonary disease, unspecified: Secondary | ICD-10-CM | POA: Diagnosis not present

## 2022-07-09 ENCOUNTER — Encounter: Payer: Self-pay | Admitting: Family Medicine

## 2022-07-09 ENCOUNTER — Ambulatory Visit (INDEPENDENT_AMBULATORY_CARE_PROVIDER_SITE_OTHER): Payer: Medicare Other | Admitting: Family Medicine

## 2022-07-09 ENCOUNTER — Other Ambulatory Visit: Payer: Self-pay | Admitting: Family Medicine

## 2022-07-09 VITALS — BP 116/78 | HR 96 | Temp 96.8°F | Resp 18 | Ht 68.0 in | Wt 297.0 lb

## 2022-07-09 DIAGNOSIS — F17200 Nicotine dependence, unspecified, uncomplicated: Secondary | ICD-10-CM | POA: Diagnosis not present

## 2022-07-09 DIAGNOSIS — I2510 Atherosclerotic heart disease of native coronary artery without angina pectoris: Secondary | ICD-10-CM

## 2022-07-09 DIAGNOSIS — G4733 Obstructive sleep apnea (adult) (pediatric): Secondary | ICD-10-CM

## 2022-07-09 DIAGNOSIS — E1142 Type 2 diabetes mellitus with diabetic polyneuropathy: Secondary | ICD-10-CM

## 2022-07-09 DIAGNOSIS — Z23 Encounter for immunization: Secondary | ICD-10-CM | POA: Diagnosis not present

## 2022-07-09 DIAGNOSIS — K219 Gastro-esophageal reflux disease without esophagitis: Secondary | ICD-10-CM

## 2022-07-09 DIAGNOSIS — J432 Centrilobular emphysema: Secondary | ICD-10-CM | POA: Diagnosis not present

## 2022-07-09 DIAGNOSIS — M1A09X0 Idiopathic chronic gout, multiple sites, without tophus (tophi): Secondary | ICD-10-CM

## 2022-07-09 DIAGNOSIS — G894 Chronic pain syndrome: Secondary | ICD-10-CM | POA: Diagnosis not present

## 2022-07-09 DIAGNOSIS — I25118 Atherosclerotic heart disease of native coronary artery with other forms of angina pectoris: Secondary | ICD-10-CM

## 2022-07-09 DIAGNOSIS — E782 Mixed hyperlipidemia: Secondary | ICD-10-CM | POA: Diagnosis not present

## 2022-07-09 DIAGNOSIS — M10022 Idiopathic gout, left elbow: Secondary | ICD-10-CM

## 2022-07-09 MED ORDER — ALBUTEROL SULFATE HFA 108 (90 BASE) MCG/ACT IN AERS: 2.0000 | INHALATION_SPRAY | RESPIRATORY_TRACT | 3 refills | Status: DC | PRN

## 2022-07-09 MED ORDER — ALLOPURINOL 300 MG PO TABS: 300.0000 mg | ORAL_TABLET | Freq: Every day | ORAL | 3 refills | Status: DC

## 2022-07-09 MED ORDER — PANTOPRAZOLE SODIUM 40 MG PO TBEC: 40.0000 mg | DELAYED_RELEASE_TABLET | Freq: Every day | ORAL | 0 refills | Status: DC

## 2022-07-09 MED ORDER — ACCU-CHEK GUIDE VI STRP: ORAL_STRIP | 5 refills | Status: DC

## 2022-07-09 NOTE — Patient Instructions (Addendum)
Increase allopurinol 300 mg daily.  Decrease pantoprazole to 40 mg once daily. Continue famotidine 20 mg twice daily.

## 2022-07-09 NOTE — Progress Notes (Addendum)
Subjective:  Patient ID: Brent Ortiz, male    DOB: [birth year]  Age: 62 y.o. MRN: [id number]  Chief Complaint  Patient presents with   Diabetes   Chronic Kidney Disease   COPD    HPI Type 2, insulin requiring diabetes, complicated by hyperlipidemia, hypertension, polyneuropathy.   Checks sugars 2-3 times per week.  Sugars:140s Current medicines for diabetes: Lantus 30 units daily, metformin 500 mg daily, and ozempic 0.5 mg weekly.   Checks feet most days.  Over due for eye doctor. Previously seen by Dr. Margorie John at [facility].   Last A1C was 6.4.   Hyperlipidemia.  Rosuvastatin 40 mg daily, Zetia 10 mg daily.  Hypertension.  On Spironolactone 25 mg  daily, metoprolol 25 mg twice a day, amlodipine 5 mg daily..  Patient is working on [employer]. Unable to exercise.    CONGESTIVE HEART FAILURE/CAD: He takes Entresto 24-26 mg twice a day, Isosorbide 60 mg daily, Spironolactone 25 mg daily, Ranexa 500 mg twice daily, metoprolol XL 25 mg daily. Cardiologist  Gout:   Allopurinol 100 mg daily. Colchicine as needed.   Constipation: on miralax 17 mg daily helps. Marland Kitchen  GERD: pepcid 20 mg twice daily. Protonix 40 mg one twice daily. Has not tried lower dose of protonix.  Insomnia: on trazodone 150 mg before bed. Works Engineer, manufacturing.  Depression: on zoloft 25 mg daily.      07/09/2022    9:20 AM 06/24/2022    2:16 PM 05/13/2022   10:10 AM  PHQ9 SCORE ONLY  PHQ-9 Total Score 0 4 0    COPD: on trelegy one inhalation daily. Albuterol inhaler 2 puffs 1-2 times per day.    BACK PAIN/HIP PAIN: ON PERCOCET 5/325 MG FOUR TIMES A DAY AS NEEDED. Radiates down left leg.  6/10 right now.    Current Outpatient Medications on File Prior to Visit  Medication Sig Dispense Refill   Accu-Chek Softclix Lancets lancets Use as instructed 100 each 5   amLODipine (NORVASC) 5 MG tablet Take 5 mg by mouth daily.     ammonium lactate (LAC-HYDRIN) 12 % lotion Apply 1 Application topically as needed for  dry skin. 400 g 5   aspirin EC (GOODSENSE ASPIRIN LOW DOSE) 81 MG tablet TAKE 1 TABLET (81MG) BY MOUTH DAILY. SWALLOW WHOLE.(YELLOW ROUND TAB WITH A HEART) 93 tablet 0   Blood Glucose Monitoring Suppl (ACCU-CHEK GUIDE) w/Device KIT 1 each by Does not apply route in the morning, at noon, and at bedtime. 1 kit 0   clopidogrel (PLAVIX) 75 MG tablet TAKE 1 TABLET BY MOUTH ONCE DAILY (ROUND PINK TABLET WITH E 34) 31 tablet 6   colchicine 0.6 MG tablet TAKE 1 TABLET(0.6 MG) BY MOUTH TWICE DAILY FOR 7 DAYS THEN TAKE 1 TABLET(0.6 MG) BY MOUTH DAILY FOR 23 DAYS 180 tablet 0   ezetimibe (ZETIA) 10 MG tablet Take 10 mg by mouth daily.     famotidine (PEPCID) 20 MG tablet Take 20 mg by mouth 2 (two) times daily.     gabapentin (NEURONTIN) 300 MG capsule TAKE 1 CAPSULE (300MG) BY MOUTH TWICE DAILY.(YELLOW CAP) 186 capsule 1   Glucagon, rDNA, (GLUCAGON EMERGENCY) 1 MG KIT Inject 1 mg into the muscle as needed (BG less than 70).     isosorbide mononitrate (IMDUR) 60 MG 24 hr tablet Take 60 mg by mouth daily.     LANTUS SOLOSTAR 100 UNIT/ML Solostar Pen ADMINISTER 30 UNITS UNDER THE SKIN DAILY 15 mL 3   metoprolol  succinate (TOPROL-XL) 25 MG 24 hr tablet Take 1 tablet (25 mg total) by mouth daily. 31 tablet 3   nitroGLYCERIN (NITROSTAT) 0.4 MG SL tablet DISSOLVE 1 TABLET UNDER TONGUE EVERY 5 MINUTES AS NEEDED CHEST PAIN. 25 tablet 1   oxyCODONE-acetaminophen (PERCOCET/ROXICET) 5-325 MG tablet Take 1 tablet by mouth every 6 (six) hours as needed for severe pain. 120 tablet 0   OZEMPIC, 0.25 OR 0.5 MG/DOSE, 2 MG/3ML SOPN INJECT 0.5MG INTO THE SKIN ONCE A WEEK. 3 mL 1   polyethylene glycol (MIRALAX / GLYCOLAX) 17 g packet Take 17 g by mouth daily.     sacubitril-valsartan (ENTRESTO) 24-26 MG TAKE 1 TABLET BY MOUTH 2 TIMES DAILY.(OBLONG PINK TAB WITH LZ NVR) 180 tablet 0   spironolactone (ALDACTONE) 25 MG tablet TAKE 1/2 TABLET(12.5) BY MOUTH DAILY. 45 tablet 3   tamsulosin (FLOMAX) 0.4 MG CAPS capsule TAKE 1 CAPSULE  (.4MG TOTAL) BY MOUTH DAILY.(GREEN AND GOLD CAPSULE WITH W 516) 93 capsule 3   TRELEGY ELLIPTA 100-62.5-25 MCG/ACT AEPB INHALE 1 PUFF ONCE DAILY 60 each 2   ULTICARE SHORT PEN NEEDLES 31G X 8 MM MISC USE AS DIRECTED (ONCE DAILY WITH LANTUS INJECTION AS INSTRUCTED) 100 each 4   No current facility-administered medications on file prior to visit.   Past Medical History:  Diagnosis Date   Acute combined systolic and diastolic CHF, NYHA class 2 ([place]) 01/22/2020   Acute gout of left elbow 02/14/2022   Acute idiopathic gout of right wrist 12/29/2021   Alcohol abuse    Atherosclerosis of coronary artery of native heart with stable angina pectoris, unspecified vessel or lesion type ([place]) 01/23/2016   Overview:  Completely occluded circumflex artery proximal to obtuse marginal 1 basin cardiac catheter from 2017   Atherosclerosis of native coronary artery of native heart without angina pectoris 01/23/2016   Overview:  Completely occluded circumflex artery proximal to obtuse marginal 1 basin cardiac catheter from 2017   Bilateral hip pain 11/17/2021   BMI 45.0-49.9, adult ([place]) 01/13/2013   Cellulitis of right upper extremity 01/01/2022   Centrilobular emphysema ([place]) 01/16/2022   Chronic kidney disease    Chronic pain 10/19/2019   Sees pain clinic   COPD (chronic obstructive pulmonary disease) ([place])    COPD exacerbation ([place]) 10/03/2019   Coronary artery disease involving native heart 02/08/2020   Coronary atherosclerosis of native coronary artery 01/23/2016   Overview:  Completely occluded circumflex artery proximal to obtuse marginal 1 basin cardiac catheter from 2017   Depression, major, single episode, moderate ([place]) 07/11/2020   Diabetic polyneuropathy ([place]) 12/06/2019   ED (erectile dysfunction) 12/06/2019   Essential hypertension 01/23/2016   Essential thrombocythemia ([place]) 09/29/2011   GERD (gastroesophageal reflux disease) 11/17/2021   GERD without esophagitis    HFrEF (heart failure with  reduced ejection fraction) ([place]) 02/08/2020   Knee pain, left 02/12/2012   Formatting of this note might be different from the original. Left   Leukocytosis 09/04/2011   Lumbar pain 11/17/2021   Mild major depression, single episode ([employer]) 02/14/2022   Mixed hyperlipidemia 06/18/2017   Mixed incontinence 02/24/2022   Morbid obesity ([place]) 11/21/2020   Morbid obesity with BMI of 40.0-44.9, adult ([place]) 01/13/2013   Myocardial infarction ([place])    04-13-2011   Obstructive chronic bronchitis with exacerbation ([place]) 10/03/2019   Obstructive sleep apnea 07/18/2011   Olecranon bursitis of left elbow 02/14/2022   Olecranon bursitis of left elbow 02/14/2022   Opiate abuse, continuous ([place])    Osteoarthritis  Pain in knee joint 02/12/2012   Formatting of this note might be different from the original. Left   Personal history of stroke with residual effects 07/24/2021   Pneumonia    Polysubstance abuse ([place])    Poor dentition    Stage 3b chronic kidney disease ([place]) 12/27/2020   Thrombocytosis 09/29/2011   Tobacco dependence 01/23/2016   Tobacco use disorder 01/23/2016   Type 2 diabetes mellitus with diabetic polyneuropathy [facility])    Past Surgical History:  Procedure Laterality Date   HERNIA REPAIR  2009   I & D KNEE WITH POLY EXCHANGE  09/03/2011   Procedure: IRRIGATION AND DEBRIDEMENT KNEE WITH POLY EXCHANGE;  Surgeon: Mauri Pole, MD;  Location: [facility];  Service: Orthopedics;  Laterality: Left;   TOOTH EXTRACTION Bilateral 08/06/2017   Procedure: DENTAL RESTORATION/EXTRACTIONS;  Surgeon: Diona Browner, DDS;  Location: [facility];  Service: Oral Surgery;  Laterality: Bilateral;   TOTAL KNEE ARTHROPLASTY   right  feb 2012   left march 2012 r   TOTAL KNEE REVISION  06/23/2011   Procedure: TOTAL KNEE REVISION;  Surgeon: Mauri Pole;  Location: [facility];  Service: Orthopedics;  Laterality: Left;  femomal nerve block done in holding area without incident    Family History  Problem Relation Age of Onset    Colon cancer Other    Colon cancer Mother    Social History   Socioeconomic History   Marital status: Widowed    Spouse name: Not on file   Number of children: 5   Years of education: Not on file   Highest education level: Not on file  Occupational History   Occupation: Disabled  Tobacco Use   Smoking status: Every Day    Packs/day: 1.00    Years: 50.00    Total pack years: 50.00    Types: Cigarettes   Smokeless tobacco: Never  Vaping Use   Vaping Use: Former  Substance and Sexual Activity   Alcohol use: Not Currently    Comment: heavy drinker in past. occasionally has a beer. Not on regular basis.   Drug use: Not Currently   Sexual activity: Not Currently  Other Topics Concern   Not on file  Social History Narrative   Not on file   Social Determinants of Health   Financial Resource Strain: Low Risk  (07/02/2022)   Overall Financial Resource Strain (CARDIA)    Difficulty of Paying Living Expenses: Not hard at all  Food Insecurity: No Food Insecurity (05/13/2022)   Hunger Vital Sign    Worried About Running Out of Food in the Last Year: Never true    Ran Out of Food in the Last Year: Never true  Transportation Needs: No Transportation Needs (07/02/2022)   PRAPARE - Hydrologist (Medical): No    Lack of Transportation (Non-Medical): No  Physical Activity: Inactive (05/13/2022)   Exercise Vital Sign    Days of Exercise per Week: 0 days    Minutes of Exercise per Session: 0 min  Stress: No Stress Concern Present (05/13/2022)   [place]    Feeling of Stress : Not at all  Social Connections: Socially Isolated (05/13/2022)   Social Connection and Isolation Panel [NHANES]    Frequency of Communication with Friends and Family: More than three times a week    Frequency of Social Gatherings with Friends and Family: More than three times a week    Attends Religious Services:  Never    Active Member of Clubs or Organizations: No    Attends Archivist Meetings: Never    Marital Status: Widowed    Review of Systems  Constitutional:  Negative for chills and fever.  HENT:  Negative for congestion, rhinorrhea and sore throat.   Respiratory:  Positive for cough and wheezing. Negative for shortness of breath.   Cardiovascular:  Negative for chest pain and palpitations.  Gastrointestinal:  Negative for abdominal pain, constipation, diarrhea, nausea and vomiting.  Genitourinary:  Negative for dysuria and urgency.  Musculoskeletal:  Positive for arthralgias (left hip and left leg pain. left knee pain.) and back pain. Negative for myalgias.  Neurological:  Negative for dizziness and headaches.  Psychiatric/Behavioral:  Negative for dysphoric mood. The patient is not nervous/anxious.      Objective:  BP 116/78   Pulse 96   Temp (!) 96.8 F (36 C)   Resp 18   Ht _0  (1.727 m)   Wt 297 lb (134.7 kg)   BMI 45.16 kg/m      07/09/2022    9:26 AM 07/09/2022    9:13 AM 06/24/2022    2:11 PM  BP/Weight  Systolic BP 841 324 401  Diastolic BP 78 90 84  Wt. (Lbs)  297 296  BMI  45.16 kg/m2 45.01 kg/m2    Physical Exam Vitals reviewed.  Constitutional:      Appearance: Normal appearance. He is obese.  Neck:     Vascular: No carotid bruit.  Cardiovascular:     Rate and Rhythm: Normal rate and regular rhythm.     Pulses: Normal pulses.     Heart sounds: Normal heart sounds.  Pulmonary:     Effort: Pulmonary effort is normal.     Breath sounds: Wheezing present. No rhonchi or rales.  Abdominal:     General: Bowel sounds are normal.     Palpations: Abdomen is soft.     Tenderness: There is no abdominal tenderness.  Neurological:     Mental Status: He is alert.  Psychiatric:        Mood and Affect: Mood normal.        Behavior: Behavior normal.     Diabetic Foot Exam - Simple   Simple Foot Form Diabetic Foot exam was performed with the  following findings: Yes 07/09/2022  9:52 AM  Visual Inspection See comments: Yes Sensation Testing See comments: Yes Pulse Check Posterior Tibialis and Dorsalis pulse intact bilaterally: Yes Comments Decreased sensation BL feet.  Calluses. dry skin. Thickened nails.       Lab Results  Component Value Date   WBC 13.4 (H) 07/09/2022   HGB 15.6 07/09/2022   HCT 45.3 07/09/2022   PLT 439 07/09/2022   GLUCOSE 153 (H) 07/09/2022   CHOL 218 (H) 07/09/2022   TRIG 228 (H) 07/09/2022   HDL 68 07/09/2022   LDLCALC 111 (H) 07/09/2022   ALT 14 07/09/2022   AST 15 07/09/2022   NA 140 07/09/2022   K 5.5 (H) 07/09/2022   CL 99 07/09/2022   CREATININE 1.33 (H) 07/09/2022   BUN 18 07/09/2022   CO2 23 07/09/2022   TSH 1.140 07/09/2022   INR 1.81 (H) 09/07/2011   HGBA1C 7.5 (H) 07/09/2022   MICROALBUR 80 09/27/2020      Assessment & Plan:   Problem List Items Addressed This Visit       Cardiovascular and Mediastinum   Atherosclerosis of native coronary artery of native heart without angina  pectoris - Primary    Continue rosuvastatin, zetia, plavix, and aspirin 81 mg daily.          Respiratory   Obstructive sleep apnea   Centrilobular emphysema (HCC)    The current medical regimen is effective;  continue present plan and medications.       Relevant Medications   albuterol (VENTOLIN HFA) 108 (90 Base) MCG/ACT inhaler     Digestive   GERD (gastroesophageal reflux disease)    Decrease pantoprazole to 40 mg once daily. Continue famotidine 20 mg twice daily.       Relevant Medications   pantoprazole (PROTONIX) 40 MG tablet     Endocrine   Type 2 diabetes mellitus with diabetic polyneuropathy (HCC)    Control:  Recommend check sugars fasting daily. Recommend check feet daily. Recommend annual eye exams. Medicines:  Lantus 30 units daily, metformin 500 mg daily, and ozempic 0.5 mg weekly.  Continue to work on eating a healthy diet and exercise.  Labs drawn today.          Relevant Medications   glucose blood (ACCU-CHEK GUIDE) test strip   Other Relevant Orders   Ambulatory referral to Optometry   Hemoglobin A1c (Completed)   Microalbumin / creatinine urine ratio (Completed)     Musculoskeletal and Integument   Acute gout of left elbow    Increase allopurinol 300 mg daily.        Relevant Medications   allopurinol (ZYLOPRIM) 300 MG tablet     Other   Mixed hyperlipidemia    Well controlled.  No changes to medicines. Rosuvastatin 40 mg daily, Zetia 10 mg daily.  Continue to work on eating a healthy diet and exercise.  Labs drawn today.        Relevant Orders   CBC with Differential/Platelet (Completed)   Comprehensive metabolic panel (Completed)   Lipid panel (Completed)   TSH (Completed)   Chronic pain syndrome   Relevant Orders   Pain Mgt Scrn (14 Drugs), Ur (Completed)   Tobacco use disorder    Recommended cessation of smoking      Need for COVID-19 vaccine   Relevant Orders   [employer] Fall 2023 Covid-19 Vaccine 72yr and older (Completed)  .  Meds ordered this encounter  Medications   albuterol (VENTOLIN HFA) 108 (90 Base) MCG/ACT inhaler    Sig: Inhale 2 puffs into the lungs every 4 (four) hours as needed for wheezing or shortness of breath.    Dispense:  25.5 g    Refill:  3    FOR APRIL BUBBLEPACKS   glucose blood (ACCU-CHEK GUIDE) test strip    Sig: Use as instructed    Dispense:  100 each    Refill:  5   allopurinol (ZYLOPRIM) 300 MG tablet    Sig: Take 1 tablet (300 mg total) by mouth daily.    Dispense:  90 tablet    Refill:  3    Start on next pill pack.   pantoprazole (PROTONIX) 40 MG tablet    Sig: Take 1 tablet (40 mg total) by mouth daily.    Dispense:  90 tablet    Refill:  0    Please start on next pill pack    Orders Placed This Encounter  Procedures   [place] 2023 Covid-19 Vaccine 132yrand older   CBC with Differential/Platelet   Comprehensive metabolic panel   Hemoglobin A1c   Lipid  panel   TSH   Microalbumin / creatinine urine ratio  Pain Mgt Scrn (14 Drugs), Ur   Cardiovascular Risk Assessment   Ambulatory referral to Optometry    Total time spent on today's visit was greater than 40 minutes, including both face-to-face time and nonface-to-face time personally spent on review of chart (labs and imaging), discussing labs and goals, discussing further work-up, treatment options, referrals to specialist if needed, reviewing outside records of pertinent, answering patient's questions, and coordinating care.   Follow-up: Return in about 3 months (around 10/08/2022) for chronic fasting.  An After Visit Summary was printed and given to the patient.  [name], MD [facility] [phone]

## 2022-07-10 ENCOUNTER — Other Ambulatory Visit: Payer: Self-pay

## 2022-07-10 DIAGNOSIS — E1142 Type 2 diabetes mellitus with diabetic polyneuropathy: Secondary | ICD-10-CM

## 2022-07-10 DIAGNOSIS — F321 Major depressive disorder, single episode, moderate: Secondary | ICD-10-CM

## 2022-07-10 LAB — LIPID PANEL
Chol/HDL Ratio: 3.2 ratio (ref 0.0–5.0)
Cholesterol, Total: 218 mg/dL — ABNORMAL HIGH (ref 100–199)
HDL: 68 mg/dL (ref >39)
LDL Chol Calc (NIH): 111 mg/dL — ABNORMAL HIGH (ref 0–99)
Triglycerides: 228 mg/dL — ABNORMAL HIGH (ref 0–149)
VLDL Cholesterol Cal: 39 mg/dL (ref 5–40)

## 2022-07-10 LAB — MICROALBUMIN / CREATININE URINE RATIO
Creatinine, Urine: 169.6 mg/dL
Microalb/Creat Ratio: 34 mg/g creat — ABNORMAL HIGH (ref 0–29)
Microalbumin, Urine: 57 ug/mL

## 2022-07-10 LAB — CBC WITH DIFFERENTIAL/PLATELET
Basophils Absolute: 0.1 10*3/uL (ref 0.0–0.2)
Basos: 0 %
EOS (ABSOLUTE): 0.1 10*3/uL (ref 0.0–0.4)
Eos: 1 %
Hematocrit: 45.3 % (ref 37.5–51.0)
Hemoglobin: 15.6 g/dL (ref 13.0–17.7)
Immature Grans (Abs): 0.1 10*3/uL (ref 0.0–0.1)
Immature Granulocytes: 1 %
Lymphocytes Absolute: 2.8 10*3/uL (ref 0.7–3.1)
Lymphs: 21 %
MCH: 31.1 pg (ref 26.6–33.0)
MCHC: 34.4 g/dL (ref 31.5–35.7)
MCV: 90 fL (ref 79–97)
Monocytes Absolute: 0.9 10*3/uL (ref 0.1–0.9)
Monocytes: 7 %
Neutrophils Absolute: 9.5 10*3/uL — ABNORMAL HIGH (ref 1.4–7.0)
Neutrophils: 70 %
Platelets: 439 10*3/uL (ref 150–450)
RBC: 5.02 x10E6/uL (ref 4.14–5.80)
RDW: 12.3 % (ref 11.6–15.4)
WBC: 13.4 10*3/uL — ABNORMAL HIGH (ref 3.4–10.8)

## 2022-07-10 LAB — PAIN MGT SCRN (14 DRUGS), UR
Amphetamine Scrn, Ur: NEGATIVE ng/mL
BARBITURATE SCREEN URINE: NEGATIVE ng/mL
BENZODIAZEPINE SCREEN, URINE: NEGATIVE ng/mL
Buprenorphine, Urine: NEGATIVE ng/mL
CANNABINOIDS UR QL SCN: NEGATIVE ng/mL
Cocaine (Metab) Scrn, Ur: NEGATIVE ng/mL
Creatinine(Crt), U: 156.4 mg/dL (ref 20.0–300.0)
Fentanyl, Urine: NEGATIVE pg/mL
Meperidine Screen, Urine: NEGATIVE ng/mL
Methadone Screen, Urine: NEGATIVE ng/mL
OXYCODONE+OXYMORPHONE UR QL SCN: POSITIVE ng/mL — AB
Opiate Scrn, Ur: POSITIVE ng/mL — AB
Ph of Urine: 5.5 (ref 4.5–8.9)
Phencyclidine Qn, Ur: NEGATIVE ng/mL
Propoxyphene Scrn, Ur: NEGATIVE ng/mL
Tramadol Screen, Urine: NEGATIVE ng/mL

## 2022-07-10 LAB — COMPREHENSIVE METABOLIC PANEL
ALT: 14 IU/L (ref 0–44)
AST: 15 IU/L (ref 0–40)
Albumin/Globulin Ratio: 1.9 (ref 1.2–2.2)
Albumin: 4.6 g/dL (ref 3.9–4.9)
Alkaline Phosphatase: 69 IU/L (ref 44–121)
BUN/Creatinine Ratio: 14 (ref 10–24)
BUN: 18 mg/dL (ref 8–27)
Bilirubin Total: 0.3 mg/dL (ref 0.0–1.2)
CO2: 23 mmol/L (ref 20–29)
Calcium: 10.2 mg/dL (ref 8.6–10.2)
Chloride: 99 mmol/L (ref 96–106)
Creatinine, Ser: 1.33 mg/dL — ABNORMAL HIGH (ref 0.76–1.27)
Globulin, Total: 2.4 g/dL (ref 1.5–4.5)
Glucose: 153 mg/dL — ABNORMAL HIGH (ref 70–99)
Potassium: 5.5 mmol/L — ABNORMAL HIGH (ref 3.5–5.2)
Sodium: 140 mmol/L (ref 134–144)
Total Protein: 7 g/dL (ref 6.0–8.5)
eGFR: 60 mL/min/{1.73_m2} (ref >59)

## 2022-07-10 LAB — HEMOGLOBIN A1C
Est. average glucose Bld gHb Est-mCnc: 169 mg/dL
Hgb A1c MFr Bld: 7.5 % — ABNORMAL HIGH (ref 4.8–5.6)

## 2022-07-10 LAB — TSH: TSH: 1.14 u[IU]/mL (ref 0.450–4.500)

## 2022-07-10 LAB — CARDIOVASCULAR RISK ASSESSMENT

## 2022-07-11 NOTE — Assessment & Plan Note (Signed)
Continue rosuvastatin, zetia, plavix, and aspirin 81 mg daily.

## 2022-07-11 NOTE — Assessment & Plan Note (Signed)
The current medical regimen is effective;  continue present plan and medications. Continue on trelegy one inhalation daily. Albuterol inhaler 2 puffs 1-2 times per day.

## 2022-07-11 NOTE — Assessment & Plan Note (Signed)
Increase allopurinol 300 mg daily.

## 2022-07-11 NOTE — Assessment & Plan Note (Signed)
Decrease pantoprazole to 40 mg once daily. Continue famotidine 20 mg twice daily.

## 2022-07-11 NOTE — Assessment & Plan Note (Signed)
Recommended cessation of smoking

## 2022-07-11 NOTE — Assessment & Plan Note (Signed)
Well controlled.  No changes to medicines. Rosuvastatin 40 mg daily, Zetia 10 mg daily.  Continue to work on eating a healthy diet and exercise.  Labs drawn today.

## 2022-07-11 NOTE — Assessment & Plan Note (Signed)
Control: improved.  Recommend check sugars fasting daily. Recommend check feet daily. Recommend annual eye exams. Medicines:  Lantus 30 units daily and ozempic 0.5 mg weekly. Recommend change metformin to xigduo xr  5/500 mg daily.  Continue to work on eating a healthy diet and exercise.  Labs drawn today.

## 2022-07-12 ENCOUNTER — Other Ambulatory Visit: Payer: Self-pay | Admitting: Family Medicine

## 2022-07-12 MED ORDER — TRAZODONE HCL 150 MG PO TABS: ORAL_TABLET | ORAL | 1 refills | Status: DC

## 2022-07-12 MED ORDER — METFORMIN HCL 500 MG PO TABS: ORAL_TABLET | ORAL | 2 refills | Status: DC

## 2022-07-12 MED ORDER — ROSUVASTATIN CALCIUM 40 MG PO TABS: 40.0000 mg | ORAL_TABLET | Freq: Every day | ORAL | 2 refills | Status: DC

## 2022-07-12 MED ORDER — SERTRALINE HCL 25 MG PO TABS: ORAL_TABLET | ORAL | 1 refills | Status: DC

## 2022-07-12 MED ORDER — RANOLAZINE ER 500 MG PO TB12: ORAL_TABLET | ORAL | 0 refills | Status: DC

## 2022-07-12 NOTE — Progress Notes (Signed)
Blood count abnormal. Wbc elevated. Unclear why this is up. Ask if sick? May have just completed prednisone.  Liver function normal.  Kidney function slightly abnormal.  Thyroid function normal.  Cholesterol: LDL improved, but not at goal. Trigs worsened.  HBA1C: 7.5. Improved from 9.3. Recommend change metformin to xigduo xr  5/500 mg daily. This will improve his diabetes, kidney function and heart function. Patient is spilling protein in urine.

## 2022-07-14 NOTE — Assessment & Plan Note (Signed)
Continue cpap.  

## 2022-07-14 NOTE — Assessment & Plan Note (Signed)
Not at goal.  Check UDS

## 2022-07-16 ENCOUNTER — Other Ambulatory Visit: Payer: Self-pay

## 2022-07-16 MED ORDER — XIGDUO XR 5-500 MG PO TB24: 1.0000 | ORAL_TABLET | Freq: Every day | ORAL | 0 refills | Status: DC

## 2022-07-17 ENCOUNTER — Telehealth: Payer: Self-pay

## 2022-07-17 NOTE — Progress Notes (Signed)
07-17-2022: Called patient for blood pressure readings and he stated he hasn't been checking them. Instructed patient to check BP and document and I will call back next week.  07-24-2022: left Patient a VM for BP readings.  07-27-2022: 07-27-2022 138/72, 01-19 140/80, 01-17 118/78, 01-15 130/74  [place] Clinical Pharmacist Assistant [phone]

## 2022-07-21 DIAGNOSIS — G4733 Obstructive sleep apnea (adult) (pediatric): Secondary | ICD-10-CM | POA: Diagnosis not present

## 2022-07-23 ENCOUNTER — Telehealth: Payer: Self-pay

## 2022-07-23 NOTE — Progress Notes (Signed)
  Care Coordination  Note  07/23/2022 Name: RAVON MORTELLARO MRN: [id number] DOB: [birth year]  KAEL KEETCH is a 62 y.o. year old male who is a primary care patient of [name], Kirsten, MD. I reached out to Illene Labrador by phone today to offer care coordination services.      Mr. Minus was given information about Care Coordination services today including:  The Care Coordination services include support from the care team which includes your Nurse Coordinator, Clinical Social Worker, or Pharmacist.  The Care Coordination team is here to help remove barriers to the health concerns and goals most important to you. Care Coordination services are voluntary and the patient may decline or stop services at any time by request to their care team member.   Patient agreed to services and verbal consent obtained.   Follow up plan: Telephone appointment with care coordination team member scheduled for:07/24/2022  [name], [place], [place] [address] Direct Dial: [phone] [email or web address]

## 2022-07-23 NOTE — Progress Notes (Signed)
  Chronic Care Management   Note  07/23/2022 Name: Brent Ortiz MRN: [id number] DOB: [birth year]  [name] is a 62 y.o. year old male who is a primary care patient of [name], Kirsten, MD. I reached out to Illene Labrador by phone today in response to a referral sent by [name]'s PCP.  Mr. [name] was given information about Chronic Care Management services today including:  CCM service includes personalized support from designated clinical staff supervised by the physician, including individualized plan of care and coordination with other care providers 24/7 contact phone numbers for assistance for urgent and routine care needs. Service will only be billed when office clinical staff spend 20 minutes or more in a month to coordinate care. Only one practitioner may furnish and bill the service in a calendar month. The patient may stop CCM services at amy time (effective at the end of the month) by phone call to the office staff. The patient will be responsible for cost sharing (co-pay) or up to 20% of the service fee (after annual deductible is met)  [name] [facility]  agreedto scheduling an appointment with the CCM RN Case Manager   Follow up plan: Patient agreed to scheduled appointment with RN Case Manager on 08/03/2022(date/time).   [name], [place], [place] [address] Direct Dial: [phone] [name]

## 2022-07-24 ENCOUNTER — Encounter: Payer: Self-pay | Admitting: *Deleted

## 2022-07-24 ENCOUNTER — Ambulatory Visit: Payer: Self-pay | Admitting: *Deleted

## 2022-07-24 NOTE — Patient Outreach (Signed)
  Care Coordination   Initial Visit Note   07/24/2022 Name: Brent Ortiz MRN: [id number] DOB: 17-Aug-1960  Brent Ortiz is a 62 y.o. year old male who sees [name], Kirsten, MD for primary care. I spoke with  Brent Ortiz by phone today.  What matters to the patients health and wellness today?  "I'm sore from a fall"    Goals Addressed             This Visit's Progress    Care Coordination Assessment       Care Coordination Interventions: Reviewed Care Coordination Services:  Assessed Social Determinants of Health Advised patient to Follow up with PCP office regarding request for "rollator walker"  Pt reports he has a CAPS aide in the home for 8 hours/Mon-Fri, and 4 hours on Saturday. His daughter, Brent Ortiz, is also nearby and assists with meals, picking up meds,e tc.  Pt has a life alert button and denies wanting/needing to pursue facility placement. Pt feels his CAPS aide, daughters help and overall home setting is ok.  Depression screen reviewed  Active listening / Reflection utilized  Problem [place] strategies reviewed Pt reports a recent fall 2 weeks ago and is having Left sided pain- encouraged pt to call PCP office if not getting better.   Transportation provided by insurance provider (Medicaid, [facility] and daughter also helps)  Solution-Focused Strategies employed:  Problem [place] strategies reviewed Collaborated with Brent Ortiz, [facility], who previously had been active with pt.  No needs identified today through assessment with pt- aside from wanting a rollator walker.  CSW will update PCP to above and follow up with pt in 7-10 days          SDOH assessments and interventions completed:  Yes  SDOH Interventions Today    Flowsheet Row Most Recent Value  SDOH Interventions   Food Insecurity Interventions Intervention Not Indicated  Housing Interventions Intervention Not Indicated  Transportation Interventions Intervention Not Indicated   Alcohol Usage Interventions Intervention Not Indicated (Score <7)  Financial Strain Interventions Intervention Not Indicated  Physical Activity Interventions Other (Comments)  [recent fall with left sided pain- declined EMS]        Care Coordination Interventions:  Yes, provided   Follow up plan: Follow up call scheduled for 07/30/22 Encounter Outcome:  Pt. Visit Completed   Brent Ortiz, MSW, [place] Worker [facility] [employer] [phone]

## 2022-07-24 NOTE — Patient Instructions (Signed)
Visit Information  Thank you for taking time to visit with me today. Please don't hesitate to contact me if I can be of assistance to you.   Following are the goals we discussed today:   Goals Addressed             This Visit's Progress    Care Coordination Assessment       Care Coordination Interventions: Reviewed Care Coordination Services:  Assessed Social Determinants of Health Advised patient to Follow up with PCP office regarding request for "rollator walker"  Pt reports he has a CAPS aide in the home for 8 hours/Mon-Fri, and 4 hours on Saturday. His daughter, [name], is also nearby and assists with meals, picking up meds,e tc.  Pt has a life alert button and denies wanting/needing to pursue facility placement. Pt feels his CAPS aide, daughters help and overall home setting is ok.  Depression screen reviewed  Active listening / Reflection utilized  Problem [place] strategies reviewed Pt reports a recent fall 2 weeks ago and is having Left sided pain- encouraged pt to call PCP office if not getting better.   Transportation provided by insurance provider (Medicaid, [facility] and daughter also helps)  Solution-Focused Strategies employed:  Problem [place] strategies reviewed Collaborated with Tomasa Rand, [facility], who previously had been active with pt.  No needs identified today through assessment with pt- aside from wanting a rollator walker.  CSW will update PCP to above and follow up with pt in 7-10 days          Our next appointment is by telephone on 07/30/22  Please call the care guide team at [phone] if you need to cancel or reschedule your appointment.   If you are experiencing a Mental Health or [place] or need someone to talk to, please call the Suicide and Crisis Lifeline: 988 call 911   The patient verbalized understanding of instructions, educational materials, and care plan provided today and DECLINED offer to receive  copy of patient instructions, educational materials, and care plan.   Telephone follow up appointment with care management team member scheduled for: 07/30/22  Eduard Clos, MSW, LCSW Clinical Social Worker [facility]

## 2022-07-30 ENCOUNTER — Encounter: Payer: Self-pay | Admitting: *Deleted

## 2022-07-31 ENCOUNTER — Encounter: Payer: Self-pay | Admitting: *Deleted

## 2022-07-31 ENCOUNTER — Telehealth: Payer: Self-pay | Admitting: *Deleted

## 2022-07-31 NOTE — Patient Outreach (Signed)
  Care Coordination   07/31/2022 Name: Brent Ortiz MRN: [id number] DOB: [birth year]   Care Coordination Outreach Attempts:  An unsuccessful telephone outreach was attempted today to offer the patient information about available care coordination services as a benefit of their health plan.   Follow Up Plan:  Additional outreach attempts will be made to offer the patient care coordination information and services.   Encounter Outcome:  No Answer   Care Coordination Interventions:  No, not indicated    Eduard Clos, MSW, [place] Worker [facility] [phone]

## 2022-08-03 ENCOUNTER — Telehealth: Payer: 59

## 2022-08-03 ENCOUNTER — Telehealth: Payer: Self-pay

## 2022-08-03 ENCOUNTER — Ambulatory Visit (INDEPENDENT_AMBULATORY_CARE_PROVIDER_SITE_OTHER): Payer: 59

## 2022-08-03 ENCOUNTER — Other Ambulatory Visit: Payer: Self-pay

## 2022-08-03 DIAGNOSIS — J432 Centrilobular emphysema: Secondary | ICD-10-CM

## 2022-08-03 DIAGNOSIS — I5020 Unspecified systolic (congestive) heart failure: Secondary | ICD-10-CM

## 2022-08-03 DIAGNOSIS — E1142 Type 2 diabetes mellitus with diabetic polyneuropathy: Secondary | ICD-10-CM

## 2022-08-03 MED ORDER — OXYCODONE-ACETAMINOPHEN 5-325 MG PO TABS: 1.0000 | ORAL_TABLET | Freq: Four times a day (QID) | ORAL | 0 refills | Status: DC

## 2022-08-03 NOTE — Plan of Care (Deleted)
Chronic Care Management Provider Comprehensive Care Plan    08/03/2022 Name: Brent Ortiz MRN: [id number] DOB: 09/26/1960  Referral to Chronic Care Management (CCM) services was placed by Provider:  Dr. [name] on Date: 07-06-2023.  Chronic Condition 1: COPD Provider Assessment and Plan  The current medical regimen is effective;  continue present plan and medications.          Relevant Medications    albuterol (VENTOLIN HFA) 108 (90 Base) MCG/ACT inhaler       {Tip this will not be part of the note when signed- Include the provider name, date and note type (Optional):26781}  Expected Outcome/Goals Addressed This Visit (Provider CCM goals/Provider Assessment and plan    CCM (COPD) EXPECTED OUTCOME: MONITOR, SELF-MANAGE AND REDUCE SYMPTOMS OF COPD   Symptom Management Condition 1: Take all medications as prescribed Attend all scheduled provider appointments Call provider office for new concerns or questions  call the Suicide and Crisis Lifeline: 988 call the [place] National Suicide Prevention Lifeline: 660-213-1950 or TTY: 570 882 7202 TTY [phone]) to talk to a trained counselor call 1-800-273-TALK (toll free, 24 hour hotline) if experiencing a Mental Health or [place]  eliminate smoking in my home identify and remove indoor air pollutants limit outdoor activity during cold weather listen for public air quality announcements every day do breathing exercises every day develop a rescue plan eliminate symptom triggers at home follow rescue plan if symptoms flare-up  Chronic Condition 2: HF Provider Assessment and Plan He takes Entresto 24-26 mg twice a day, Isosorbide 60 mg daily, Spironolactone 25 mg daily, Ranexa 500 mg twice daily, metoprolol XL 25 mg daily. Cardiologist  {Tip this will not be part of the note when signed- Include the provider name, date and note type (Optional):26781}  Expected Outcome/Goals Addressed This Visit (Provider CCM  goals/Provider Assessment and plan   CCM (HEART FAILURE)  EXPECTED OUTCOME:  MONITOR, SELF-MANAGE AND REDUCE SYMPTOMS OF HEART FAILURE   Symptom Management Condition 2: Take all medications as prescribed Attend all scheduled provider appointments Call provider office for new concerns or questions  call the Suicide and Crisis Lifeline: 988 call the [place] National Suicide Prevention Lifeline: 908-719-3406 or TTY: 914-728-7882 TTY [phone]) to talk to a trained counselor call 1-800-273-TALK (toll free, 24 hour hotline) if experiencing a Mental Health or [place]  call office if I gain more than 2 pounds in one day or 5 pounds in one week keep legs up while sitting use salt in moderation watch for swelling in feet, ankles and legs every day weigh myself daily develop a rescue plan follow rescue plan if symptoms flare-up track symptoms and what helps feel better or worse dress right for the weather, hot or cold   Chronic Condition 3: DM Provider Assessment and Plan   Control:  Recommend check sugars fasting daily. Recommend check feet daily. Recommend annual eye exams. Medicines:  Lantus 30 units daily, metformin 500 mg daily, and ozempic 0.5 mg weekly.  Continue to work on eating a healthy diet and exercise.  Labs drawn today.            Relevant Medications    glucose blood (ACCU-CHEK GUIDE) test strip    Other Relevant Orders    Ambulatory referral to Optometry    Hemoglobin A1c (Completed)    Microalbumin / creatinine urine ratio (Completed)   {Tip this will not be part of the note when signed- Include the provider name, date and note type (Optional):26781}  Expected Outcome/Goals Addressed This Visit (Provider CCM goals/Provider Assessment and plan   CCM (DIABETES) EXPECTED OUTCOME: MONITOR, SELF-MANAGE AND REDUCE SYMPTOMS OF DIABETES  Symptom Management Condition 3: Take all medications as prescribed Attend all scheduled provider  appointments Call provider office for new concerns or questions  Work with the social worker to address care coordination needs and will continue to work with the clinical team to address health care and disease management related needs call the Suicide and Crisis Lifeline: 988 call the [place] National Suicide Prevention Lifeline: 715-696-7910 or TTY: 504-179-4230 TTY [phone]) to talk to a trained counselor call 1-800-273-TALK (toll free, 24 hour hotline) if experiencing a Mental Health or [place]  schedule appointment with eye doctor check feet daily for cuts, sores or redness trim toenails straight across manage portion size wash and dry feet carefully every day wear comfortable, cotton socks wear comfortable, well-fitting shoes Problem List Patient Active Problem List   Diagnosis Date Noted   Need for COVID-19 vaccine 07/09/2022   Idiopathic chronic gout of multiple sites without tophus 03/47/4259   Uncomplicated opioid dependence ([place]) 03/30/2022   Need for influenza vaccination 03/30/2022   Chronic kidney disease 03/02/2022   Myocardial infarction ([place]) 03/02/2022   Osteoarthritis 03/02/2022   Type 2 diabetes mellitus with diabetic polyneuropathy ([place]) 03/02/2022   Mixed incontinence 02/24/2022   Mild major depression, single episode ([place]) 02/14/2022   Gout 02/14/2022   Centrilobular emphysema ([place]) 01/16/2022   Acute idiopathic gout of right wrist 12/29/2021   Bilateral hip pain 11/17/2021   Lumbar pain 11/17/2021   GERD (gastroesophageal reflux disease) 11/17/2021   Personal history of stroke with residual effects 07/24/2021   Stage 3b chronic kidney disease ([place]) 12/27/2020   Morbid obesity ([place]) 11/21/2020   Depression, major, single episode, moderate ([place]) 07/11/2020   Coronary artery disease involving native heart 02/08/2020   HFrEF (heart failure with reduced ejection fraction) ([place]) 02/08/2020   Acute combined systolic and diastolic CHF, NYHA  class 2 ([place]) 01/22/2020   ED (erectile dysfunction) 12/06/2019   Diabetic polyneuropathy ([place]) 12/06/2019   Chronic pain syndrome 10/19/2019   Obstructive chronic bronchitis with exacerbation ([place]) 10/03/2019   Mixed hyperlipidemia 06/18/2017   Alcohol abuse    Opiate abuse, continuous (HCC)    Polysubstance abuse ([place])    Atherosclerosis of native coronary artery of native heart without angina pectoris 01/23/2016   Essential hypertension 01/23/2016   Tobacco dependence 01/23/2016   Tobacco use disorder 01/23/2016   Atherosclerosis of coronary artery of native heart with stable angina pectoris, unspecified vessel or lesion type ([place]) 01/23/2016   Coronary atherosclerosis of native coronary artery 01/23/2016   BMI 45.0-49.9, adult ([place]) 01/13/2013   Morbid obesity with BMI of 40.0-44.9, adult ([place]) 01/13/2013   Knee pain, left 02/12/2012   Pain in knee joint 02/12/2012   Essential thrombocythemia ([place]) 09/29/2011   Thrombocytosis 09/29/2011   Leukocytosis 09/04/2011   Poor dentition 09/04/2011   Obstructive sleep apnea 07/18/2011    Medication Management  Current Outpatient Medications:    Accu-Chek Softclix Lancets lancets, Use as instructed, Disp: 100 each, Rfl: 5   albuterol (VENTOLIN HFA) 108 (90 Base) MCG/ACT inhaler, Inhale 2 puffs into the lungs every 4 (four) hours as needed for wheezing or shortness of breath., Disp: 25.5 g, Rfl: 3   allopurinol (ZYLOPRIM) 300 MG tablet, Take 1 tablet (300 mg total) by mouth daily., Disp: 90 tablet, Rfl: 3   amLODipine (NORVASC) 5 MG tablet, Take 5 mg by mouth daily.,  Disp: , Rfl:    ammonium lactate (LAC-HYDRIN) 12 % lotion, Apply 1 Application topically as needed for dry skin., Disp: 400 g, Rfl: 5   aspirin EC (GOODSENSE ASPIRIN LOW DOSE) 81 MG tablet, TAKE 1 TABLET ('81MG'$ ) BY MOUTH DAILY. SWALLOW WHOLE.(YELLOW ROUND TAB WITH A HEART), Disp: 93 tablet, Rfl: 0   Blood Glucose Monitoring Suppl (ACCU-CHEK GUIDE) w/Device KIT, 1 each by Does  not apply route in the morning, at noon, and at bedtime., Disp: 1 kit, Rfl: 0   clopidogrel (PLAVIX) 75 MG tablet, TAKE 1 TABLET BY MOUTH ONCE DAILY (ROUND PINK TABLET WITH E 34), Disp: 31 tablet, Rfl: 6   colchicine 0.6 MG tablet, TAKE 1 TABLET(0.6 MG) BY MOUTH TWICE DAILY FOR 7 DAYS THEN TAKE 1 TABLET(0.6 MG) BY MOUTH DAILY FOR 23 DAYS, Disp: 180 tablet, Rfl: 0   Dapagliflozin Pro-metFORMIN ER (XIGDUO XR) 5-500 MG TB24, Take 1 tablet by mouth daily., Disp: 90 tablet, Rfl: 0   ezetimibe (ZETIA) 10 MG tablet, Take 10 mg by mouth daily., Disp: , Rfl:    famotidine (PEPCID) 20 MG tablet, Take 20 mg by mouth 2 (two) times daily., Disp: , Rfl:    gabapentin (NEURONTIN) 300 MG capsule, TAKE 1 CAPSULE ('300MG'$ ) BY MOUTH TWICE DAILY.(YELLOW CAP), Disp: 186 capsule, Rfl: 1   Glucagon, rDNA, (GLUCAGON EMERGENCY) 1 MG KIT, Inject 1 mg into the muscle as needed (BG less than 70)., Disp: , Rfl:    glucose blood (ACCU-CHEK GUIDE) test strip, Use as instructed, Disp: 100 each, Rfl: 5   isosorbide mononitrate (IMDUR) 60 MG 24 hr tablet, Take 60 mg by mouth daily., Disp: , Rfl:    LANTUS SOLOSTAR 100 UNIT/ML Solostar Pen, ADMINISTER 30 UNITS UNDER THE SKIN DAILY, Disp: 15 mL, Rfl: 3   metoprolol succinate (TOPROL-XL) 25 MG 24 hr tablet, Take 1 tablet (25 mg total) by mouth daily., Disp: 31 tablet, Rfl: 3   nitroGLYCERIN (NITROSTAT) 0.4 MG SL tablet, DISSOLVE 1 TABLET UNDER TONGUE EVERY 5 MINUTES AS NEEDED CHEST PAIN., Disp: 25 tablet, Rfl: 1   oxyCODONE-acetaminophen (PERCOCET/ROXICET) 5-325 MG tablet, Take by mouth every 4 (four) hours as needed for severe pain., Disp: , Rfl:    OZEMPIC, 0.25 OR 0.5 MG/DOSE, 2 MG/3ML SOPN, INJECT 0.'5MG'$  INTO THE SKIN ONCE A WEEK., Disp: 3 mL, Rfl: 1   pantoprazole (PROTONIX) 40 MG tablet, Take 1 tablet (40 mg total) by mouth daily., Disp: 90 tablet, Rfl: 0   polyethylene glycol (MIRALAX / GLYCOLAX) 17 g packet, Take 17 g by mouth daily., Disp: , Rfl:    ranolazine (RANEXA) 500 MG 12  hr tablet, TAKE 1 TABLET BY MOUTH 2 TIMES DAILY (BLUE OBLONG TABLET WITH H R18), Disp: 186 tablet, Rfl: 0   rosuvastatin (CRESTOR) 40 MG tablet, Take 1 tablet (40 mg total) by mouth daily., Disp: 93 tablet, Rfl: 2   sacubitril-valsartan (ENTRESTO) 24-26 MG, TAKE 1 TABLET BY MOUTH 2 TIMES DAILY.(OBLONG PINK TAB WITH LZ NVR), Disp: 180 tablet, Rfl: 0   sertraline (ZOLOFT) 25 MG tablet, TAKE 1 TABLET ('25MG'$ ) BY MOUTH DAILY.(OBLONG GREEN TABLET WITH A 16), Disp: 93 tablet, Rfl: 1   spironolactone (ALDACTONE) 25 MG tablet, TAKE 1/2 TABLET(12.5) BY MOUTH DAILY., Disp: 45 tablet, Rfl: 3   tamsulosin (FLOMAX) 0.4 MG CAPS capsule, TAKE 1 CAPSULE (.'4MG'$  TOTAL) BY MOUTH DAILY.(GREEN AND GOLD CAPSULE WITH W 516), Disp: 93 capsule, Rfl: 3   traZODone (DESYREL) 150 MG tablet, TAKE 1 TABLET('150MG'$ ) BY MOUTH AT BEDTIME (OVAL WHITE  TABLET WITH 8 07, or 13 32 & 50 50 50), Disp: 93 tablet, Rfl: 1   TRELEGY ELLIPTA 100-62.5-25 MCG/ACT AEPB, INHALE 1 PUFF ONCE DAILY, Disp: 60 each, Rfl: 2   ULTICARE SHORT PEN NEEDLES 31G X 8 MM MISC, USE AS DIRECTED (ONCE DAILY WITH LANTUS INJECTION AS INSTRUCTED), Disp: 100 each, Rfl: 4  Cognitive Assessment Identity Confirmed: : Name; DOB Cognitive Status: Normal   Functional Assessment No data recorded  Caregiver Assessment  No data recorded  Planned Interventions {Tip this will not be part of the note when signed- Include the provider name, date and note type (Optional):26781} ***  Interaction and coordination with outside resources, practitioners, and providers See CCM Referral  Care Plan: Printed and mailed to patient

## 2022-08-03 NOTE — Plan of Care (Signed)
Chronic Care Management Provider Comprehensive Care Plan    08/03/2022 Name: Brent Ortiz MRN: [id number] DOB: April 20, 1961    Referral to Chronic Care Management (CCM) services was placed by Provider:  Dr. [name] on Date: 07-05-2022.   Chronic Condition 1: COPD Provider Assessment and Plan      The current medical regimen is effective;  continue present plan and medications.           Relevant Medications    albuterol (VENTOLIN HFA) 108 (90 Base) MCG/ACT inhaler           Expected Outcome/Goals Addressed This Visit (Provider CCM goals/Provider Assessment and plan     CCM (COPD) EXPECTED OUTCOME: MONITOR, SELF-MANAGE AND REDUCE SYMPTOMS OF COPD     Symptom Management Condition 1: Take all medications as prescribed Attend all scheduled provider appointments Call provider office for new concerns or questions  call the Suicide and Crisis Lifeline: 988 call the [place] National Suicide Prevention Lifeline: (320)296-6746 or TTY: (352)438-2712 TTY 405-631-5069) to talk to a trained counselor call 1-800-273-TALK (toll free, 24 hour hotline) if experiencing a Mental Health or [place]  eliminate smoking in my home identify and remove indoor air pollutants limit outdoor activity during cold weather listen for public air quality announcements every day do breathing exercises every day develop a rescue plan eliminate symptom triggers at home follow rescue plan if symptoms flare-up   Chronic Condition 2: HF Provider Assessment and Plan He takes Entresto 24-26 mg twice a day, Isosorbide 60 mg daily, Spironolactone 25 mg daily, Ranexa 500 mg twice daily, metoprolol XL 25 mg daily. Cardiologist     Expected Outcome/Goals Addressed This Visit (Provider CCM goals/Provider Assessment and plan    CCM (HEART FAILURE)  EXPECTED OUTCOME:  MONITOR, SELF-MANAGE AND REDUCE SYMPTOMS OF HEART FAILURE     Symptom Management Condition 2: Take all medications as  prescribed Attend all scheduled provider appointments Call provider office for new concerns or questions  call the Suicide and Crisis Lifeline: 988 call the [place] National Suicide Prevention Lifeline: (608) 250-2872 or TTY: 6847880403 TTY (810) 503-1045) to talk to a trained counselor call 1-800-273-TALK (toll free, 24 hour hotline) if experiencing a Mental Health or [place]  call office if I gain more than 2 pounds in one day or 5 pounds in one week keep legs up while sitting use salt in moderation watch for swelling in feet, ankles and legs every day weigh myself daily develop a rescue plan follow rescue plan if symptoms flare-up track symptoms and what helps feel better or worse dress right for the weather, hot or cold     Chronic Condition 3: DM Provider Assessment and Plan       Control:  Recommend check sugars fasting daily. Recommend check feet daily. Recommend annual eye exams. Medicines:  Lantus 30 units daily, metformin 500 mg daily, and ozempic 0.5 mg weekly.  Continue to work on eating a healthy diet and exercise.  Labs drawn today.             Relevant Medications    glucose blood (ACCU-CHEK GUIDE) test strip    Other Relevant Orders    Ambulatory referral to Optometry    Hemoglobin A1c (Completed)    Microalbumin / creatinine urine ratio (Completed)      Expected Outcome/Goals Addressed This Visit (Provider CCM goals/Provider Assessment and plan    CCM (DIABETES) EXPECTED OUTCOME: MONITOR, SELF-MANAGE AND REDUCE SYMPTOMS OF DIABETES   Symptom Management Condition 3:  Take all medications as prescribed Attend all scheduled provider appointments Call provider office for new concerns or questions  Work with the social worker to address care coordination needs and will continue to work with the clinical team to address health care and disease management related needs call the Suicide and Crisis Lifeline: 988 call the [place] National Suicide  Prevention Lifeline: 229 774 5999 or TTY: 737-423-2742 TTY (519)518-3805) to talk to a trained counselor call 1-800-273-TALK (toll free, 24 hour hotline) if experiencing a Mental Health or [place]  schedule appointment with eye doctor check feet daily for cuts, sores or redness trim toenails straight across manage portion size wash and dry feet carefully every day wear comfortable, cotton socks wear comfortable, well-fitting shoes  Problem List Patient Active Problem List   Diagnosis Date Noted   Need for COVID-19 vaccine 07/09/2022   Idiopathic chronic gout of multiple sites without tophus 62/69/4854   Uncomplicated opioid dependence ([place]) 03/30/2022   Need for influenza vaccination 03/30/2022   Chronic kidney disease 03/02/2022   Myocardial infarction ([place]) 03/02/2022   Osteoarthritis 03/02/2022   Type 2 diabetes mellitus with diabetic polyneuropathy ([place]) 03/02/2022   Mixed incontinence 02/24/2022   Mild major depression, single episode ([place]) 02/14/2022   Gout 02/14/2022   Centrilobular emphysema ([place]) 01/16/2022   Acute idiopathic gout of right wrist 12/29/2021   Bilateral hip pain 11/17/2021   Lumbar pain 11/17/2021   GERD (gastroesophageal reflux disease) 11/17/2021   Personal history of stroke with residual effects 07/24/2021   Stage 3b chronic kidney disease ([place]) 12/27/2020   Morbid obesity ([place]) 11/21/2020   Depression, major, single episode, moderate ([place]) 07/11/2020   Coronary artery disease involving native heart 02/08/2020   HFrEF (heart failure with reduced ejection fraction) ([place]) 02/08/2020   Acute combined systolic and diastolic CHF, NYHA class 2 ([place]) 01/22/2020   ED (erectile dysfunction) 12/06/2019   Diabetic polyneuropathy ([place]) 12/06/2019   Chronic pain syndrome 10/19/2019   Obstructive chronic bronchitis with exacerbation ([place]) 10/03/2019   Mixed hyperlipidemia 06/18/2017   Alcohol abuse    Opiate abuse, continuous (H[facility])     Polysubstance abuse ([place])    Atherosclerosis of native coronary artery of native heart without angina pectoris 01/23/2016   Essential hypertension 01/23/2016   Tobacco dependence 01/23/2016   Tobacco use disorder 01/23/2016   Atherosclerosis of coronary artery of native heart with stable angina pectoris, unspecified vessel or lesion type ([place]) 01/23/2016   Coronary atherosclerosis of native coronary artery 01/23/2016   BMI 45.0-49.9, adult ([place]) 01/13/2013   Morbid obesity with BMI of 40.0-44.9, adult ([place]) 01/13/2013   Knee pain, left 02/12/2012   Pain in knee joint 02/12/2012   Essential thrombocythemia ([place]) 09/29/2011   Thrombocytosis 09/29/2011   Leukocytosis 09/04/2011   Poor dentition 09/04/2011   Obstructive sleep apnea 07/18/2011    Medication Management  Current Outpatient Medications:    Accu-Chek Softclix Lancets lancets, Use as instructed, Disp: 100 each, Rfl: 5   albuterol (VENTOLIN HFA) 108 (90 Base) MCG/ACT inhaler, Inhale 2 puffs into the lungs every 4 (four) hours as needed for wheezing or shortness of breath., Disp: 25.5 g, Rfl: 3   allopurinol (ZYLOPRIM) 300 MG tablet, Take 1 tablet (300 mg total) by mouth daily., Disp: 90 tablet, Rfl: 3   amLODipine (NORVASC) 5 MG tablet, Take 5 mg by mouth daily., Disp: , Rfl:    ammonium lactate (LAC-HYDRIN) 12 % lotion, Apply 1 Application topically as needed for dry skin., Disp: 400 g, Rfl: 5  aspirin EC (GOODSENSE ASPIRIN LOW DOSE) 81 MG tablet, TAKE 1 TABLET ('81MG'$ ) BY MOUTH DAILY. SWALLOW WHOLE.(YELLOW ROUND TAB WITH A HEART), Disp: 93 tablet, Rfl: 0   Blood Glucose Monitoring Suppl (ACCU-CHEK GUIDE) w/Device KIT, 1 each by Does not apply route in the morning, at noon, and at bedtime., Disp: 1 kit, Rfl: 0   clopidogrel (PLAVIX) 75 MG tablet, TAKE 1 TABLET BY MOUTH ONCE DAILY (ROUND PINK TABLET WITH E 34), Disp: 31 tablet, Rfl: 6   colchicine 0.6 MG tablet, TAKE 1 TABLET(0.6 MG) BY MOUTH TWICE DAILY FOR 7 DAYS THEN TAKE 1  TABLET(0.6 MG) BY MOUTH DAILY FOR 23 DAYS, Disp: 180 tablet, Rfl: 0   Dapagliflozin Pro-metFORMIN ER (XIGDUO XR) 5-500 MG TB24, Take 1 tablet by mouth daily., Disp: 90 tablet, Rfl: 0   ezetimibe (ZETIA) 10 MG tablet, Take 10 mg by mouth daily., Disp: , Rfl:    famotidine (PEPCID) 20 MG tablet, Take 20 mg by mouth 2 (two) times daily., Disp: , Rfl:    gabapentin (NEURONTIN) 300 MG capsule, TAKE 1 CAPSULE ('300MG'$ ) BY MOUTH TWICE DAILY.(YELLOW CAP), Disp: 186 capsule, Rfl: 1   Glucagon, rDNA, (GLUCAGON EMERGENCY) 1 MG KIT, Inject 1 mg into the muscle as needed (BG less than 70)., Disp: , Rfl:    glucose blood (ACCU-CHEK GUIDE) test strip, Use as instructed, Disp: 100 each, Rfl: 5   isosorbide mononitrate (IMDUR) 60 MG 24 hr tablet, Take 60 mg by mouth daily., Disp: , Rfl:    LANTUS SOLOSTAR 100 UNIT/ML Solostar Pen, ADMINISTER 30 UNITS UNDER THE SKIN DAILY, Disp: 15 mL, Rfl: 3   metoprolol succinate (TOPROL-XL) 25 MG 24 hr tablet, Take 1 tablet (25 mg total) by mouth daily., Disp: 31 tablet, Rfl: 3   nitroGLYCERIN (NITROSTAT) 0.4 MG SL tablet, DISSOLVE 1 TABLET UNDER TONGUE EVERY 5 MINUTES AS NEEDED CHEST PAIN., Disp: 25 tablet, Rfl: 1   oxyCODONE-acetaminophen (PERCOCET/ROXICET) 5-325 MG tablet, Take by mouth every 4 (four) hours as needed for severe pain., Disp: , Rfl:    OZEMPIC, 0.25 OR 0.5 MG/DOSE, 2 MG/3ML SOPN, INJECT 0.'5MG'$  INTO THE SKIN ONCE A WEEK., Disp: 3 mL, Rfl: 1   pantoprazole (PROTONIX) 40 MG tablet, Take 1 tablet (40 mg total) by mouth daily., Disp: 90 tablet, Rfl: 0   polyethylene glycol (MIRALAX / GLYCOLAX) 17 g packet, Take 17 g by mouth daily., Disp: , Rfl:    ranolazine (RANEXA) 500 MG 12 hr tablet, TAKE 1 TABLET BY MOUTH 2 TIMES DAILY (BLUE OBLONG TABLET WITH H R18), Disp: 186 tablet, Rfl: 0   rosuvastatin (CRESTOR) 40 MG tablet, Take 1 tablet (40 mg total) by mouth daily., Disp: 93 tablet, Rfl: 2   sacubitril-valsartan (ENTRESTO) 24-26 MG, TAKE 1 TABLET BY MOUTH 2 TIMES  DAILY.(OBLONG PINK TAB WITH LZ NVR), Disp: 180 tablet, Rfl: 0   sertraline (ZOLOFT) 25 MG tablet, TAKE 1 TABLET ('25MG'$ ) BY MOUTH DAILY.(OBLONG GREEN TABLET WITH A 16), Disp: 93 tablet, Rfl: 1   spironolactone (ALDACTONE) 25 MG tablet, TAKE 1/2 TABLET(12.5) BY MOUTH DAILY., Disp: 45 tablet, Rfl: 3   tamsulosin (FLOMAX) 0.4 MG CAPS capsule, TAKE 1 CAPSULE (.'4MG'$  TOTAL) BY MOUTH DAILY.(GREEN AND GOLD CAPSULE WITH W 516), Disp: 93 capsule, Rfl: 3   traZODone (DESYREL) 150 MG tablet, TAKE 1 TABLET('150MG'$ ) BY MOUTH AT BEDTIME (OVAL WHITE TABLET WITH 8 07, or 13 32 & 50 50 50), Disp: 93 tablet, Rfl: 1   TRELEGY ELLIPTA 100-62.5-25 MCG/ACT AEPB, INHALE 1 PUFF ONCE DAILY,  Disp: 60 each, Rfl: 2   ULTICARE SHORT PEN NEEDLES 31G X 8 MM MISC, USE AS DIRECTED (ONCE DAILY WITH LANTUS INJECTION AS INSTRUCTED), Disp: 100 each, Rfl: 4  Cognitive Assessment Identity Confirmed: : Name; DOB Cognitive Status: Normal   Functional Assessment Hearing Difficulty or Deaf: yes Hearing Management: the patient is hard of hearing, the patient had hearing aides but lost them about 3 years ago when he moved after his wife died Wear Glasses or Blind: yes Vision Management: wears glasses, overdue to an eye exam Concentrating, Remembering or Making Decisions Difficulty (CP): no Difficulty Communicating: no Difficulty Eating/Swallowing: no Walking or Climbing Stairs Difficulty: yes Walking or Climbing Stairs: ambulation difficulty, requires equipment Mobility Management: uses his cane or walker when ambulating, has balance issues, high risk for falls Dressing/Bathing Difficulty: yes Dressing/Bathing: bathing difficulty, assistance 1 person Dressing/Bathing Management: has help in the home 6 days a week Doing Errands Independently Difficulty (such as shopping) (CP): no Change in Functional Status Since Onset of Current Illness/Injury: no   Caregiver Assessment  Primary Source of Support/Comfort: child(ren) Name of  Support/Comfort Primary Source: daughter, Rosalva Ferron in Home: alone Family Caregiver if Needed: child(ren), adult Family Caregiver Names: daughter, [name] Primary Roles/Responsibilities: disabled   Planned Interventions  Provided education to patient about basic DM disease process. The patient states he does not check his blood sugars like he should and it is his fault; Reviewed medications with patient and discussed importance of medication adherence. The patient states compliance with medications. Works with pharm D.;        Reviewed prescribed diet with patient heart healthy/ADA diet. Education provided on heart healthy/ADA diet. Will send AVS with information on ADA diet ; Counseled on importance of regular laboratory monitoring as prescribed. The patient had labs drawn on 07-09-2022 and the most recent A1C was 7.5        Discussed plans with patient for ongoing care management follow up and provided patient with direct contact information for care management team;      Provided patient with written educational materials related to hypo and hyperglycemia and importance of correct treatment. Education on the sx and sx of hypo and hyperglycemia. Will send educational information in the mail to the patient on sx and sx;       Reviewed scheduled/upcoming provider appointments including: 10-13-2022 at 0940 am;         Advised patient, providing education and rationale, to check cbg twice daily and when you have symptoms of low or high blood sugar and record. The patient states he has not been checking his blood sugars. The patient states that he knows he should do a better job. Education on the benefits of checking blood sugars on a regular basis and the goal of fasting of <130 and post prandial of <180. The patient agrees to start checking his blood sugars more frequently.         call provider for findings outside established parameters;       Referral made to pharmacy team for assistance with  medication management and needs;       Referral made to social work team for assistance with SDOH, ongoing support and education;      Review of patient status, including review of consultants reports, relevant laboratory and other test results, and medications completed;       Advised patient to discuss changes in DM and questions/Concerns with provider;      Screening for signs and symptoms of depression  related to chronic disease state;        Assessed social determinant of health barriers;     The patient states it has been over 2 years since having an eye exam. Encouraged the patient to get an appointment to have his eyes checked. He has noticed changes in his vision. Education and support given. The patient is hard of hearing. Had hearing aides but lost them when his daughter moved him 3 years ago. Will collaborate with the MM nurses for ideas and help for hearing aides.     Basic overview and discussion of pathophysiology of Heart Failure reviewed Provided education on low sodium diet. Education and review Reviewed Heart Failure Action Plan in depth and provided written copy Assessed need for readable accurate scales in home Provided education about placing scale on hard, flat surface Advised patient to weigh each morning after emptying bladder Discussed importance of daily weight and advised patient to weigh and record daily Reviewed role of diuretics in prevention of fluid overload and management of heart failure Discussed the importance of keeping all appointments with provider. The patient saw the pcp on 07-09-2022. The patient ask about pain medication increase for his chronic back and hip, knee pain. Education on the provider may want to have the patient come into the office to be evaluated. He states this is fine. Will reach out to the pcp for recommendations by secure messaging. Will continue to monitor. Provided patient with education about the role of exercise in the management of  heart failure Advised patient to discuss changes in HF or heart health with provider Screening for signs and symptoms of depression related to chronic disease state  Assessed social determinant of health barriers Provided patient with basic written and verbal COPD education on self care/management/and exacerbation prevention Advised patient to track and manage COPD triggers. The patient gets easily short of breath especially with activity. The patient states that he has to pace his activity.  Provided written and verbal instructions on pursed lip breathing and utilized returned demonstration as teach back Provided instruction about proper use of medications used for management of COPD including inhalers Advised patient to self assesses COPD action plan zone and make appointment with provider if in the yellow zone for 48 hours without improvement Provided education about and advised patient to utilize infection prevention strategies to reduce risk of respiratory infection. Education provided Discussed the importance of adequate rest and management of fatigue with COPD Screening for signs and symptoms of depression related to chronic disease state  Assessed social determinant of health barriers   Interaction and coordination with outside resources, practitioners, and providers See CCM Referral  Care Plan: Printed and mailed to patient

## 2022-08-03 NOTE — Patient Instructions (Addendum)
Please call the care guide team at [phone] if you need to cancel or reschedule your appointment.   If you are experiencing a Mental Health or [place] or need someone to talk to, please call the Suicide and Crisis Lifeline: 988 call the [place] National Suicide Prevention Lifeline: 307-394-3868 or TTY: (561)734-5121 TTY (475) 593-2918) to talk to a trained counselor call 1-800-273-TALK (toll free, 24 hour hotline)   Following is a copy of the CCM Program Consent:  CCM service includes personalized support from designated clinical staff supervised by the physician, including individualized plan of care and coordination with other care providers 24/7 contact phone numbers for assistance for urgent and routine care needs. Service will only be billed when office clinical staff spend 20 minutes or more in a month to coordinate care. Only one practitioner may furnish and bill the service in a calendar month. The patient may stop CCM services at amy time (effective at the end of the month) by phone call to the office staff. The patient will be responsible for cost sharing (co-pay) or up to 20% of the service fee (after annual deductible is met)  Following is a copy of your full provider care plan:   Goals Addressed             This Visit's Progress    CCM Expected Outcome:  Monitor, Self-Manage and Reduce Symptoms of Diabetes       Current Barriers:  Knowledge Deficits related to self care of chronic conditions and the importance of blood sugar checks, adherence to dietary measures, monitoring for changes, and reporting abnormal findings Care Coordination needs related to interdisciplinary collaboration and working together to help the patient in a patient with DM Chronic Disease Management support and education needs related to effective management of DM.  Lab Results  Component Value Date   HGBA1C 7.5 (H) 07/09/2022    Planned Interventions: Provided education to patient  about basic DM disease process. The patient states he does not check his blood sugars like he should and it is his fault; Reviewed medications with patient and discussed importance of medication adherence. The patient states compliance with medications. Works with pharm D.;        Reviewed prescribed diet with patient heart healthy/ADA diet. Education provided on heart healthy/ADA diet. Will send AVS with information on ADA diet ; Counseled on importance of regular laboratory monitoring as prescribed. The patient had labs drawn on 07-09-2022 and the most recent A1C was 7.5        Discussed plans with patient for ongoing care management follow up and provided patient with direct contact information for care management team;      Provided patient with written educational materials related to hypo and hyperglycemia and importance of correct treatment. Education on the sx and sx of hypo and hyperglycemia. Will send educational information in the mail to the patient on sx and sx;       Reviewed scheduled/upcoming provider appointments including: 10-13-2022 at 0940 am;         Advised patient, providing education and rationale, to check cbg twice daily and when you have symptoms of low or high blood sugar and record. The patient states he has not been checking his blood sugars. The patient states that he knows he should do a better job. Education on the benefits of checking blood sugars on a regular basis and the goal of fasting of <130 and post prandial of <180. The patient agrees to start checking  his blood sugars more frequently.         call provider for findings outside established parameters;       Referral made to pharmacy team for assistance with medication management and needs;       Referral made to social work team for assistance with SDOH, ongoing support and education;      Review of patient status, including review of consultants reports, relevant laboratory and other test results, and medications  completed;       Advised patient to discuss changes in DM and questions/Concerns with provider;      Screening for signs and symptoms of depression related to chronic disease state;        Assessed social determinant of health barriers;     The patient states it has been over 2 years since having an eye exam. Encouraged the patient to get an appointment to have his eyes checked. He has noticed changes in his vision. Education and support given. The patient is hard of hearing. Had hearing aides but lost them when his daughter moved him 3 years ago. Will collaborate with the MM nurses for ideas and help for hearing aides.      Symptom Management: Take medications as prescribed   Attend all scheduled provider appointments Call provider office for new concerns or questions  Work with the social worker to address care coordination needs and will continue to work with the clinical team to address health care and disease management related needs call the Suicide and Crisis Lifeline: 988 call the [place] National Suicide Prevention Lifeline: 564-845-7248 or TTY: (772) 507-4624 TTY 671-551-2369) to talk to a trained counselor call 1-800-273-TALK (toll free, 24 hour hotline) if experiencing a Mental Health or [place]  schedule appointment with eye doctor check feet daily for cuts, sores or redness trim toenails straight across manage portion size wash and dry feet carefully every day wear comfortable, cotton socks wear comfortable, well-fitting shoes  Follow Up Plan: Telephone follow up appointment with care management team member scheduled for: 08-31-2022 at 345 pm       CCM Expected Outcome:  Monitor, Self-Manage and Reduce Symptoms of: Heart Failure       Current Barriers:  Knowledge Deficits related to the importance of daily weights and monitoring for changes in HF sx and sx Chronic Disease Management support and education needs related to HF Wt Readings from Last 3 Encounters:   07/09/22 297 lb (134.7 kg)  06/24/22 296 lb (134.3 kg)  03/30/22 (!) 301 lb (136.5 kg)     Planned Interventions: Basic overview and discussion of pathophysiology of Heart Failure reviewed Provided education on low sodium diet. Education and review Reviewed Heart Failure Action Plan in depth and provided written copy Assessed need for readable accurate scales in home Provided education about placing scale on hard, flat surface Advised patient to weigh each morning after emptying bladder Discussed importance of daily weight and advised patient to weigh and record daily Reviewed role of diuretics in prevention of fluid overload and management of heart failure Discussed the importance of keeping all appointments with provider. The patient saw the pcp on 07-09-2022. The patient ask about pain medication increase for his chronic back and hip, knee pain. Education on the provider may want to have the patient come into the office to be evaluated. He states this is fine. Will reach out to the pcp for recommendations by secure messaging. Will continue to monitor. Provided patient with education about the role  of exercise in the management of heart failure Advised patient to discuss changes in HF or heart health with provider Screening for signs and symptoms of depression related to chronic disease state  Assessed social determinant of health barriers  Symptom Management: Take medications as prescribed   Attend all scheduled provider appointments Call provider office for new concerns or questions  call the Suicide and Crisis Lifeline: 988 call the [place] National Suicide Prevention Lifeline: (617) 492-9777 or TTY: 7471567637 TTY 331-710-0162) to talk to a trained counselor call 1-800-273-TALK (toll free, 24 hour hotline) if experiencing a Mental Health or [place]  call office if I gain more than 2 pounds in one day or 5 pounds in one week keep legs up while sitting use salt in  moderation watch for swelling in feet, ankles and legs every day weigh myself daily develop a rescue plan follow rescue plan if symptoms flare-up track symptoms and what helps feel better or worse dress right for the weather, hot or cold  Follow Up Plan: Telephone follow up appointment with care management team member scheduled for: 08-31-2022 at 345 pm       CCM:  Maintain, Monitor and Self-Manage Symptoms of COPD       Current Barriers:  Chronic Disease Management support and education needs related to effective management of COPD  Planned Interventions: Provided patient with basic written and verbal COPD education on self care/management/and exacerbation prevention Advised patient to track and manage COPD triggers. The patient gets easily short of breath especially with activity. The patient states that he has to pace his activity.  Provided written and verbal instructions on pursed lip breathing and utilized returned demonstration as teach back Provided instruction about proper use of medications used for management of COPD including inhalers Advised patient to self assesses COPD action plan zone and make appointment with provider if in the yellow zone for 48 hours without improvement Provided education about and advised patient to utilize infection prevention strategies to reduce risk of respiratory infection. Education provided Discussed the importance of adequate rest and management of fatigue with COPD Screening for signs and symptoms of depression related to chronic disease state  Assessed social determinant of health barriers  Symptom Management: Take medications as prescribed   Attend all scheduled provider appointments Call provider office for new concerns or questions  call the Suicide and Crisis Lifeline: 988 call the [place] National Suicide Prevention Lifeline: 9400147123 or TTY: 4311760432 TTY 782-050-3247) to talk to a trained counselor call 1-800-273-TALK (toll  free, 24 hour hotline) if experiencing a Mental Health or [place]  avoid second hand smoke eliminate smoking in my home identify and remove indoor air pollutants limit outdoor activity during cold weather listen for public air quality announcements every day do breathing exercises every day develop a rescue plan eliminate symptom triggers at home follow rescue plan if symptoms flare-up  Follow Up Plan: Telephone follow up appointment with care management team member scheduled for: 08-31-2022 at 345 pm          The patient verbalized understanding of instructions, educational materials, and care plan provided today and agreed to receive a mailed copy of patient instructions, educational materials, and care plan.   Telephone follow up appointment with care management team member scheduled for: 08-31-2022 at 345 pm  [name] RN, MSN, CCM RN Care Manager  Chronic Care Management Direct Number: [phone]     Hearing Loss Hearing loss is a partial or total loss of the  ability to hear. This can be temporary or permanent, and it can happen in one or both ears. There are two types of hearing loss. You can have just one type or both types. You may have a problem with: Damage to your hearing nerves (sensorineural hearing loss). This type of hearing loss is more likely to be permanent. A hearing aid is often the best treatment. Sound getting to your inner ear (conductive hearing loss). This type of hearing loss can usually be treated medically or surgically. Medical care is necessary to treat hearing loss properly and to prevent the condition from getting worse. Your hearing may partially or completely come back, depending on what caused your hearing loss and how severe it is. In some cases, hearing loss is permanent. What are the causes? Common causes of hearing loss include: Too much wax in the ear canal. Infection of the ear canal or middle ear. Fluid in the middle  ear. Injury to the ear or surrounding area. An object stuck in the ear. A history of prolonged exposure to loud sounds, such as music. Less common causes of hearing loss include: Tumors in the ear. Viral or bacterial infections, such as meningitis. A hole in the eardrum (perforated eardrum). Problems with the hearing nerve that sends signals between the brain and the ear. Certain medicines. What are the signs or symptoms? Symptoms of this condition may include: Difficulty telling the difference between sounds. Difficulty following a conversation when there is background noise. Lack of response to sounds in your environment. This may be most noticeable when you do not respond to startling sounds. Needing to turn up the volume on the television, radio, or other devices. Ringing in the ears. Dizziness. How is this diagnosed? This condition is diagnosed based on: A physical exam. A hearing test (audiometry). The test will be performed by a hearing specialist (audiologist). Imaging tests, such as an MRI or CT scan. You may also be referred to an ear, nose, and throat (ENT) specialist (otolaryngologist). How is this treated? Treatment for hearing loss may include: Earwax removal. Medicines to treat or prevent infection (antibiotics). Medicines to reduce inflammation (corticosteroids). Hearing aids or assistive listening devices. Follow these instructions at home: If you were prescribed an antibiotic medicine, take it as told by your health care provider. Do not stop taking the antibiotic even if you start to feel better. Take over-the-counter and prescription medicines only as told by your health care provider. Avoid loud noises. Use hearing protection if you are exposed to loud noises or work in a noisy environment. Return to your normal activities as told by your health care provider. Ask your health care provider what activities are safe for you. Keep all follow-up visits. This is  important. Contact a health care provider if: You feel dizzy. You develop new symptoms. You vomit or feel nauseous. You have a fever. Get help right away if: You develop sudden changes in your vision. You have severe ear pain. You have new or increased weakness. You have a severe headache. Summary Hearing loss is a decreased ability to hear sounds around you. It can be temporary or permanent. Treatment will depend on the cause of your hearing loss. It may include earwax removal, medicines, or a hearing aid. Your hearing may partially or completely come back, depending on what caused your hearing loss and how severe it is. Keep all follow-up visits. This is important. This information is not intended to replace advice given to you by your health  care provider. Make sure you discuss any questions you have with your health care provider. Document Revised: 05/01/2021 Document Reviewed: 05/01/2021 Elsevier Patient Education  [place].  Blood Pressure Record Sheet To take your blood pressure, you will need a blood pressure machine. You may be prescribed one, or you can buy a blood pressure machine (blood pressure monitor) at your clinic, drug store, or online. When choosing one, look for these features: An automatic monitor that has an arm cuff. A cuff that wraps snugly, but not too tightly, around your upper arm. You should be able to fit only one finger between your arm and the cuff. A device that stores blood pressure reading results. Do not choose a monitor that measures your blood pressure from your wrist or finger. Follow your health care provider's instructions for how to take your blood pressure. To use this form: Get one reading in the morning (a.m.) before you take any medicines. Get one reading in the evening (p.m.) before supper. Take at least two readings with each blood pressure check. This makes sure the results are correct. Wait 1-2 minutes between  measurements. Write down the results in the spaces on this form. Repeat this once a week, or as told by your health care provider. Make a follow-up appointment with your health care provider to discuss the results. Blood pressure log Date: _______________________ a.m. _____________________(1st reading) _____________________(2nd reading) p.m. _____________________(1st reading) _____________________(2nd reading) Date: _______________________ a.m. _____________________(1st reading) _____________________(2nd reading) p.m. _____________________(1st reading) _____________________(2nd reading) Date: _______________________ a.m. _____________________(1st reading) _____________________(2nd reading) p.m. _____________________(1st reading) _____________________(2nd reading) Date: _______________________ a.m. _____________________(1st reading) _____________________(2nd reading) p.m. _____________________(1st reading) _____________________(2nd reading) Date: _______________________ a.m. _____________________(1st reading) _____________________(2nd reading) p.m. _____________________(1st reading) _____________________(2nd reading) This information is not intended to replace advice given to you by your health care provider. Make sure you discuss any questions you have with your health care provider. Document Revised: 03/06/2021 Document Reviewed: 03/06/2021 Elsevier Patient Education  [place]. Heart Failure Action Plan A heart failure action plan helps you understand what to do when you have symptoms of heart failure. Your action plan is a color-coded plan that lists the symptoms to watch for and indicates what actions to take. If you have symptoms in the red zone, you need medical care right away. If you have symptoms in the yellow zone, you are having problems. If you have symptoms in the green zone, you are doing well. Follow the plan that was created by you and your health care provider.  Review your plan each time you visit your health care provider. Red zone These signs and symptoms mean you should get medical help right away: You have trouble breathing when resting. You have a dry cough that is getting worse. You have swelling or pain in your legs or abdomen that is getting worse. You suddenly gain more than 2-3 lb (0.9-1.4 kg) in 24 hours, or more than 5 lb (2.3 kg) in a week. This amount may be more or less depending on your condition. You have trouble staying awake or you feel confused. You have chest pain. You do not have an appetite. You pass out. You have worsening sadness or depression. If you have any of these symptoms, call your local emergency services (911 in the [place] right away. Do not drive yourself to the hospital. Yellow zone These signs and symptoms mean your condition may be getting worse and you should make some changes: You have trouble breathing when you are active, or you need  to sleep with your head raised on extra pillows to help you breathe. You have swelling in your legs or abdomen. You gain 2-3 lb (0.9-1.4 kg) in 24 hours, or 5 lb (2.3 kg) in a week. This amount may be more or less depending on your condition. You get tired easily. You have trouble sleeping. You have a dry cough. If you have any of these symptoms: Contact your health care provider within the next day. Your health care provider may adjust your medicines. Green zone These signs mean you are doing well and can continue what you are doing: You do not have shortness of breath. You have very little swelling or no new swelling. Your weight is stable (no gain or loss). You have a normal activity level. You do not have chest pain or any other new symptoms. Follow these instructions at home: Take over-the-counter and prescription medicines only as told by your health care provider. Weigh yourself daily. Your target weight is __________ lb (__________ kg). Call your health care  provider if you gain more than __________ lb (__________ kg) in 24 hours, or more than __________ lb (__________ kg) in a week. Health care provider name: _____________________________________________________ Health care provider phone number: _____________________________________________________ Eat a heart-healthy diet. Work with a diet and nutrition specialist (dietitian) to create an eating plan that is best for you. Keep all follow-up visits. This is important. Where to find more information American Heart Association: Summary A heart failure action plan helps you understand what to do when you have symptoms of heart failure. Follow the action plan that was created by you and your health care provider. Get help right away if you have any symptoms in the red zone. This information is not intended to replace advice given to you by your health care provider. Make sure you discuss any questions you have with your health care provider. Document Revised: 09/30/2021 Document Reviewed: 02/05/2020 Elsevier Patient Education  [place]. Hyperglycemia Hyperglycemia is when the sugar (glucose) level in your blood is too high. High blood sugar can happen to people who have or do not have diabetes. High blood sugar can happen quickly. It can be an emergency. What are the causes? If you have diabetes, high blood sugar may be caused by: Medicines that increase blood sugar or affect your control of diabetes. Getting less physical activity. Overeating. Being sick or injured or having an infection. Having surgery. Stress. Not giving yourself enough insulin (if you are taking it). You may have high blood sugar because you have diabetes that has not been diagnosed yet. If you do not have diabetes, high blood sugar may be caused by: Certain medicines. Stress. A bad illness. An infection. Having surgery. Diseases of the pancreas. What increases the risk? This condition is more likely to  develop in people who have risk factors for diabetes, such as: Having a family member with diabetes. Certain conditions in which the body's defense system (immune system) attacks itself. These are called autoimmune disorders. Being overweight. Not being active. Having a condition called insulin resistance. Having a history of: Prediabetes. Diabetes when pregnant. Polycystic ovarian syndrome (PCOS). What are the signs or symptoms? This condition may not cause symptoms. If you do have symptoms, they may include: Feeling more thirsty than normal. Needing to pee (urinate) more often than normal. Hunger. Feeling very tired. Blurry eyesight (vision). You may get other symptoms as the condition gets worse, such as: Dry mouth. Pain in your belly (abdomen). Not being hungry (  loss of appetite). Breath that smells fruity. Weakness. Weight loss that is not planned. A tingling or numb feeling in your hands or feet. A headache. Cuts or bruises that heal slowly. How is this treated? Treatment depends on the cause of your condition. Treatment may include: Taking medicine to control your blood sugar levels. Changing your medicine or dosage if you take insulin or other diabetes medicines. Lifestyle changes. These may include: Exercising more. Eating healthier foods. Losing weight. Treating an illness or infection. Checking your blood sugar more often. Stopping or reducing steroid medicines. If your condition gets very bad, you will need to be treated in the hospital. Follow these instructions at home: General instructions Take over-the-counter and prescription medicines only as told by your doctor. Do not smoke or use any products that contain nicotine or tobacco. If you need help quitting, ask your doctor. If you drink alcohol: Limit how much you have to: 0-1 drink a day for women who are not pregnant. 0-2 drinks a day for men. Know how much alcohol is in a drink. In the [place] one  drink equals one 12 oz bottle of beer (355 mL), one 5 oz glass of wine (148 mL), or one 1 oz glass of hard liquor (44 mL). Manage stress. If you need help with this, ask your doctor. Do exercises as told by your doctor. Keep all follow-up visits. Eating and drinking  Stay at a healthy weight. Make sure you drink enough fluid when you: Exercise. Get sick. Are in hot temperatures. Drink enough fluid to keep your pee (urine) pale yellow. If you have diabetes:  Know the symptoms of high blood sugar. Follow your diabetes management plan as told by your doctor. Make sure you: Take insulin and medicines as told. Follow your exercise plan. Follow your meal plan. Eat on time. Do not skip meals. Check your blood sugar as often as told. Make sure you check before and after exercise. If you exercise longer or in a different way, check your blood sugar more often. Follow your sick day plan whenever you cannot eat or drink normally. Make this plan ahead of time with your doctor. Share your diabetes management plan with people in your workplace, school, and household. Check your pee for ketones when you are ill and as told by your doctor. Carry a card or wear jewelry that says that you have diabetes. Where to find more information American Diabetes Association: www.diabetes.org Contact a doctor if: Your blood sugar level is at or above 240 mg/dL (13.3 mmol/L) for 2 days in a row. You have problems keeping your blood sugar in your target range. You have high blood pressure often. You have signs of illness, such as: Feeling like you may vomit (feeling nauseous). Vomiting. A fever. Get help right away if: Your blood sugar monitor reads "high" even when you are taking insulin. You have trouble breathing. You have a change in how you think, feel, or act (mental status). You feel like you may vomit, and the feeling does not go away. You cannot stop vomiting. These symptoms may be an emergency.  Get medical help right away. Call your local emergency services (911 in the [place] Do not wait to see if the symptoms will go away. Do not drive yourself to the hospital. Summary Hyperglycemia is when the sugar (glucose) level in your blood is too high. High blood sugar can happen to people who have or do not have diabetes. Make sure you drink enough fluids  and follow your meal plan. Exercise as often as told by your doctor. Contact your doctor if you have problems keeping your blood sugar in your target range. This information is not intended to replace advice given to you by your health care provider. Make sure you discuss any questions you have with your health care provider. Document Revised: 04/05/2020 Document Reviewed: 04/05/2020 Elsevier Patient Education  [place]. Hypoglycemia Hypoglycemia is when the sugar (glucose) level in your blood is too low. Low blood sugar can happen to people who have diabetes and people who do not have diabetes. Low blood sugar can happen quickly, and it can be an emergency. What are the causes? This condition happens most often in people who have diabetes. It may be caused by: Diabetes medicine. Not eating enough, or not eating often enough. Doing more physical activity. Drinking alcohol on an empty stomach. If you do not have diabetes, this condition may be caused by: A tumor in the pancreas. Not eating enough, or not eating for long periods at a time (fasting). A very bad infection or illness. Problems after having weight loss (bariatric) surgery. Kidney failure or liver failure. Certain medicines. What increases the risk? This condition is more likely to develop in people who: Have diabetes and take medicines to lower their blood sugar. Abuse alcohol. Have a very bad illness. What are the signs or symptoms? Mild Hunger. Sweating and feeling clammy. Feeling dizzy or light-headed. Being sleepy or having trouble sleeping. Feeling  like you may vomit (nauseous). A fast heartbeat. A headache. Blurry vision. Mood changes, such as: Being grouchy. Feeling worried or nervous (anxious). Tingling or loss of feeling (numbness) around your mouth, lips, or tongue. Moderate Confusion and poor judgment. Behavior changes. Weakness. Uneven heartbeat. Trouble with moving (coordination). Very low Very low blood sugar (severe hypoglycemia) is a medical emergency. It can cause: Fainting. Seizures. Loss of consciousness (coma). Death. How is this treated? Treating low blood sugar Low blood sugar is often treated by eating or drinking something that has sugar in it right away. The food or drink should contain 15 grams of a fast-acting carb (carbohydrate). Options include: 4 oz (120 mL) of fruit juice. 4 oz (120 mL) of regular soda (not diet soda). A few pieces of hard candy. Check food labels to see how many pieces to eat for 15 grams. 1 Tbsp (15 mL) of sugar or honey. 4 glucose tablets. 1 tube of glucose gel. Treating low blood sugar if you have diabetes If you can think clearly and swallow safely, follow the 15:15 rule: Take 15 grams of a fast-acting carb. Talk with your doctor about how much you should take. Always keep a source of fast-acting carb with you, such as: Glucose tablets (take 4 tablets). A few pieces of hard candy. Check food labels to see how many pieces to eat for 15 grams. 4 oz (120 mL) of fruit juice. 4 oz (120 mL) of regular soda (not diet soda). 1 Tbsp (15 mL) of honey or sugar. 1 tube of glucose gel. Check your blood sugar 15 minutes after you take the carb. If your blood sugar is still at or below 70 mg/dL (3.9 mmol/L), take 15 grams of a carb again. If your blood sugar does not go above 70 mg/dL (3.9 mmol/L) after 3 tries, get help right away. After your blood sugar goes back to normal, eat a meal or a snack within 1 hour.  Treating very low blood sugar If your blood sugar  is below 54 mg/dL (3  mmol/L), you have very low blood sugar, or severe hypoglycemia. This is an emergency. Get medical help right away. If you have very low blood sugar and you cannot eat or drink, you will need to be given a hormone called glucagon. A family member or friend should learn how to check your blood sugar and how to give you glucagon. Ask your doctor if you need to have an emergency glucagon kit at home. Very low blood sugar may also need to be treated in a hospital. Follow these instructions at home: General instructions Take over-the-counter and prescription medicines only as told by your doctor. Stay aware of your blood sugar as told by your doctor. If you drink alcohol: Limit how much you have to: 0-1 drink a day for women who are not pregnant. 0-2 drinks a day for men. Know how much alcohol is in your drink. In the [place] one drink equals one 12 oz bottle of beer (355 mL), one 5 oz glass of wine (148 mL), or one 1 oz glass of hard liquor (44 mL). Be sure to eat food when you drink alcohol. Know that your body absorbs alcohol quickly. This may lead to low blood sugar later. Be sure to keep checking your blood sugar. Keep all follow-up visits. If you have diabetes:  Always have a fast-acting carb (15 grams) with you to treat low blood sugar. Follow your diabetes care plan as told by your doctor. Make sure you: Know the symptoms of low blood sugar. Check your blood sugar as often as told. Always check it before and after exercise. Always check your blood sugar before you drive. Take your medicines as told. Follow your meal plan. Eat on time. Do not skip meals. Share your diabetes care plan with: Your work or school. People you live with. Carry a card or wear jewelry that says you have diabetes. Where to find more information American Diabetes Association: www.diabetes.org Contact a doctor if: You have trouble keeping your blood sugar in your target range. You have low blood sugar  often. Get help right away if: You still have symptoms after you eat or drink something that contains 15 grams of fast-acting carb, and you cannot get your blood sugar above 70 mg/dL by following the 15:15 rule. Your blood sugar is below 54 mg/dL (3 mmol/L). You have a seizure. You faint. These symptoms may be an emergency. Get help right away. Call your local emergency services (911 in the [place] Do not wait to see if the symptoms will go away. Do not drive yourself to the hospital. Summary Hypoglycemia happens when the level of sugar (glucose) in your blood is too low. Low blood sugar can happen to people who have diabetes and people who do not have diabetes. Low blood sugar can happen quickly, and it can be an emergency. Make sure you know the symptoms of low blood sugar and know how to treat it. Always keep a source of sugar (fast-acting carb) with you to treat low blood sugar. This information is not intended to replace advice given to you by your health care provider. Make sure you discuss any questions you have with your health care provider. Document Revised: 05/23/2020 Document Reviewed: 05/23/2020 Elsevier Patient Education  [place]. Diabetes Mellitus and Nutrition, Adult When you have diabetes, or diabetes mellitus, it is very important to have healthy eating habits because your blood sugar (glucose) levels are greatly affected by what you eat and  drink. Eating healthy foods in the right amounts, at about the same times every day, can help you: Manage your blood glucose. Lower your risk of heart disease. Improve your blood pressure. Reach or maintain a healthy weight. What can affect my meal plan? Every person with diabetes is different, and each person has different needs for a meal plan. Your health care provider may recommend that you work with a dietitian to make a meal plan that is best for you. Your meal plan may vary depending on factors such as: The calories you  need. The medicines you take. Your weight. Your blood glucose, blood pressure, and cholesterol levels. Your activity level. Other health conditions you have, such as heart or kidney disease. How do carbohydrates affect me? Carbohydrates, also called carbs, affect your blood glucose level more than any other type of food. Eating carbs raises the amount of glucose in your blood. It is important to know how many carbs you can safely have in each meal. This is different for every person. Your dietitian can help you calculate how many carbs you should have at each meal and for each snack. How does alcohol affect me? Alcohol can cause a decrease in blood glucose (hypoglycemia), especially if you use insulin or take certain diabetes medicines by mouth. Hypoglycemia can be a life-threatening condition. Symptoms of hypoglycemia, such as sleepiness, dizziness, and confusion, are similar to symptoms of having too much alcohol. Do not drink alcohol if: Your health care provider tells you not to drink. You are pregnant, may be pregnant, or are planning to become pregnant. If you drink alcohol: Limit how much you have to: 0-1 drink a day for women. 0-2 drinks a day for men. Know how much alcohol is in your drink. In the [place] one drink equals one 12 oz bottle of beer (355 mL), one 5 oz glass of wine (148 mL), or one 1 oz glass of hard liquor (44 mL). Keep yourself hydrated with water, diet soda, or unsweetened iced tea. Keep in mind that regular soda, juice, and other mixers may contain a lot of sugar and must be counted as carbs. What are tips for following this plan?  Reading food labels Start by checking the serving size on the Nutrition Facts label of packaged foods and drinks. The number of calories and the amount of carbs, fats, and other nutrients listed on the label are based on one serving of the item. Many items contain more than one serving per package. Check the total grams (g) of carbs in one  serving. Check the number of grams of saturated fats and trans fats in one serving. Choose foods that have a low amount or none of these fats. Check the number of milligrams (mg) of salt (sodium) in one serving. Most people should limit total sodium intake to less than 2,300 mg per day. Always check the nutrition information of foods labeled as "low-fat" or "nonfat." These foods may be higher in added sugar or refined carbs and should be avoided. Talk to your dietitian to identify your daily goals for nutrients listed on the label. Shopping Avoid buying canned, pre-made, or processed foods. These foods tend to be high in fat, sodium, and added sugar. Shop around the outside edge of the grocery store. This is where you will most often find fresh fruits and vegetables, bulk grains, fresh meats, and fresh dairy products. Cooking Use low-heat cooking methods, such as baking, instead of high-heat cooking methods, such as deep frying. Lacinda Axon  using healthy oils, such as olive, canola, or sunflower oil. Avoid cooking with butter, cream, or high-fat meats. Meal planning Eat meals and snacks regularly, preferably at the same times every day. Avoid going long periods of time without eating. Eat foods that are high in fiber, such as fresh fruits, vegetables, beans, and whole grains. Eat 4-6 oz (112-168 g) of lean protein each day, such as lean meat, chicken, fish, eggs, or tofu. One ounce (oz) (28 g) of lean protein is equal to: 1 oz (28 g) of meat, chicken, or fish. 1 egg.  cup (62 g) of tofu. Eat some foods each day that contain healthy fats, such as avocado, nuts, seeds, and fish. What foods should I eat? Fruits Berries. Apples. Oranges. Peaches. Apricots. Plums. Grapes. Mangoes. Papayas. Pomegranates. Kiwi. Cherries. Vegetables Leafy greens, including lettuce, spinach, kale, chard, collard greens, mustard greens, and cabbage. Beets. Cauliflower. Broccoli. Carrots. Green beans. Tomatoes. Peppers.  Onions. Cucumbers. Brussels sprouts. Grains Whole grains, such as whole-wheat or whole-grain bread, crackers, tortillas, cereal, and pasta. Unsweetened oatmeal. Quinoa. Brown or wild rice. Meats and other proteins Seafood. Poultry without skin. Lean cuts of poultry and beef. Tofu. Nuts. Seeds. Dairy Low-fat or fat-free dairy products such as milk, yogurt, and cheese. The items listed above may not be a complete list of foods and beverages you can eat and drink. Contact a dietitian for more information. What foods should I avoid? Fruits Fruits canned with syrup. Vegetables Canned vegetables. Frozen vegetables with butter or cream sauce. Grains Refined white flour and flour products such as bread, pasta, snack foods, and cereals. Avoid all processed foods. Meats and other proteins Fatty cuts of meat. Poultry with skin. Breaded or fried meats. Processed meat. Avoid saturated fats. Dairy Full-fat yogurt, cheese, or milk. Beverages Sweetened drinks, such as soda or iced tea. The items listed above may not be a complete list of foods and beverages you should avoid. Contact a dietitian for more information. Questions to ask a health care provider Do I need to meet with a certified diabetes care and education specialist? Do I need to meet with a dietitian? What number can I call if I have questions? When are the best times to check my blood glucose? Where to find more information: American Diabetes Association: diabetes.org Academy of Nutrition and Dietetics: eatright.[employer] of Diabetes and Digestive and Kidney Diseases: [employer] Association of Diabetes Care & Education Specialists: diabeteseducator.org Summary It is important to have healthy eating habits because your blood sugar (glucose) levels are greatly affected by what you eat and drink. It is important to use alcohol carefully. A healthy meal plan will help you manage your blood glucose and lower your risk of  heart disease. Your health care provider may recommend that you work with a dietitian to make a meal plan that is best for you. This information is not intended to replace advice given to you by your health care provider. Make sure you discuss any questions you have with your health care provider. Document Revised: 01/24/2020 Document Reviewed: 01/24/2020 Elsevier Patient Education  [place].

## 2022-08-03 NOTE — Chronic Care Management (AMB) (Signed)
Chronic Care Management   CCM RN Visit Note  08/03/2022 Name: GENE GLAZEBROOK MRN: [id number] DOB: 1960-12-24  Subjective: Brent Ortiz is a 62 y.o. year old male who is a primary care patient of [name], Kirsten, MD. The patient was referred to the Chronic Care Management team for assistance with care management needs subsequent to provider initiation of CCM services and plan of care.    Today's Visit:  Engaged with patient by telephone for initial visit.     SDOH Interventions Today    Flowsheet Row Most Recent Value  SDOH Interventions   Food Insecurity Interventions Intervention Not Indicated  Housing Interventions Intervention Not Indicated  Transportation Interventions Intervention Not Indicated  Utilities Interventions Intervention Not Indicated  Alcohol Usage Interventions Intervention Not Indicated (Score <7)  Financial Strain Interventions Intervention Not Indicated  Stress Interventions Intervention Not Indicated  Social Connections Interventions Intervention Not Indicated, Other (Comment)  [good support system]         Goals Addressed             This Visit's Progress    CCM Expected Outcome:  Monitor, Self-Manage and Reduce Symptoms of Diabetes       Current Barriers:  Knowledge Deficits related to self care of chronic conditions and the importance of blood sugar checks, adherence to dietary measures, monitoring for changes, and reporting abnormal findings Care Coordination needs related to interdisciplinary collaboration and working together to help the patient in a patient with DM Chronic Disease Management support and education needs related to effective management of DM.  Lab Results  Component Value Date   HGBA1C 7.5 (H) 07/09/2022    Planned Interventions: Provided education to patient about basic DM disease process. The patient states he does not check his blood sugars like he should and it is his fault; Reviewed medications with patient and  discussed importance of medication adherence. The patient states compliance with medications. Works with pharm D.;        Reviewed prescribed diet with patient heart healthy/ADA diet. Education provided on heart healthy/ADA diet. Will send AVS with information on ADA diet ; Counseled on importance of regular laboratory monitoring as prescribed. The patient had labs drawn on 07-09-2022 and the most recent A1C was 7.5        Discussed plans with patient for ongoing care management follow up and provided patient with direct contact information for care management team;      Provided patient with written educational materials related to hypo and hyperglycemia and importance of correct treatment. Education on the sx and sx of hypo and hyperglycemia. Will send educational information in the mail to the patient on sx and sx;       Reviewed scheduled/upcoming provider appointments including: 10-13-2022 at 0940 am;         Advised patient, providing education and rationale, to check cbg twice daily and when you have symptoms of low or high blood sugar and record. The patient states he has not been checking his blood sugars. The patient states that he knows he should do a better job. Education on the benefits of checking blood sugars on a regular basis and the goal of fasting of <130 and post prandial of <180. The patient agrees to start checking his blood sugars more frequently.         call provider for findings outside established parameters;       Referral made to pharmacy team for assistance with medication management and needs;  Referral made to social work team for assistance with SDOH, ongoing support and education;      Review of patient status, including review of consultants reports, relevant laboratory and other test results, and medications completed;       Advised patient to discuss changes in DM and questions/Concerns with provider;      Screening for signs and symptoms of depression related to  chronic disease state;        Assessed social determinant of health barriers;     The patient states it has been over 2 years since having an eye exam. Encouraged the patient to get an appointment to have his eyes checked. He has noticed changes in his vision. Education and support given. The patient is hard of hearing. Had hearing aides but lost them when his daughter moved him 3 years ago. Will collaborate with the MM nurses for ideas and help for hearing aides.      Symptom Management: Take medications as prescribed   Attend all scheduled provider appointments Call provider office for new concerns or questions  Work with the social worker to address care coordination needs and will continue to work with the clinical team to address health care and disease management related needs call the Suicide and Crisis Lifeline: 988 call the [place] National Suicide Prevention Lifeline: 417-369-3487 or TTY: (660)224-0950 TTY (910)161-7794) to talk to a trained counselor call 1-800-273-TALK (toll free, 24 hour hotline) if experiencing a Mental Health or [place]  schedule appointment with eye doctor check feet daily for cuts, sores or redness trim toenails straight across manage portion size wash and dry feet carefully every day wear comfortable, cotton socks wear comfortable, well-fitting shoes  Follow Up Plan: Telephone follow up appointment with care management team member scheduled for: 08-31-2022 at 345 pm       CCM Expected Outcome:  Monitor, Self-Manage and Reduce Symptoms of: Heart Failure       Current Barriers:  Knowledge Deficits related to the importance of daily weights and monitoring for changes in HF sx and sx Chronic Disease Management support and education needs related to HF Wt Readings from Last 3 Encounters:  07/09/22 297 lb (134.7 kg)  06/24/22 296 lb (134.3 kg)  03/30/22 (!) 301 lb (136.5 kg)     Planned Interventions: Basic overview and discussion of  pathophysiology of Heart Failure reviewed Provided education on low sodium diet. Education and review Reviewed Heart Failure Action Plan in depth and provided written copy Assessed need for readable accurate scales in home Provided education about placing scale on hard, flat surface Advised patient to weigh each morning after emptying bladder Discussed importance of daily weight and advised patient to weigh and record daily Reviewed role of diuretics in prevention of fluid overload and management of heart failure Discussed the importance of keeping all appointments with provider. The patient saw the pcp on 07-09-2022. The patient ask about pain medication increase for his chronic back and hip, knee pain. Education on the provider may want to have the patient come into the office to be evaluated. He states this is fine. Will reach out to the pcp for recommendations by secure messaging. Will continue to monitor. Provided patient with education about the role of exercise in the management of heart failure Advised patient to discuss changes in HF or heart health with provider Screening for signs and symptoms of depression related to chronic disease state  Assessed social determinant of health barriers  Symptom Management: Take medications  as prescribed   Attend all scheduled provider appointments Call provider office for new concerns or questions  call the Suicide and Crisis Lifeline: 988 call the [place] National Suicide Prevention Lifeline: 2508000172 or TTY: 773 550 9409 TTY 661 598 5196) to talk to a trained counselor call 1-800-273-TALK (toll free, 24 hour hotline) if experiencing a Mental Health or [place]  call office if I gain more than 2 pounds in one day or 5 pounds in one week keep legs up while sitting use salt in moderation watch for swelling in feet, ankles and legs every day weigh myself daily develop a rescue plan follow rescue plan if symptoms flare-up track  symptoms and what helps feel better or worse dress right for the weather, hot or cold  Follow Up Plan: Telephone follow up appointment with care management team member scheduled for: 08-31-2022 at 345 pm       CCM:  Maintain, Monitor and Self-Manage Symptoms of COPD       Current Barriers:  Chronic Disease Management support and education needs related to effective management of COPD  Planned Interventions: Provided patient with basic written and verbal COPD education on self care/management/and exacerbation prevention Advised patient to track and manage COPD triggers. The patient gets easily short of breath especially with activity. The patient states that he has to pace his activity.  Provided written and verbal instructions on pursed lip breathing and utilized returned demonstration as teach back Provided instruction about proper use of medications used for management of COPD including inhalers Advised patient to self assesses COPD action plan zone and make appointment with provider if in the yellow zone for 48 hours without improvement Provided education about and advised patient to utilize infection prevention strategies to reduce risk of respiratory infection. Education provided Discussed the importance of adequate rest and management of fatigue with COPD Screening for signs and symptoms of depression related to chronic disease state  Assessed social determinant of health barriers  Symptom Management: Take medications as prescribed   Attend all scheduled provider appointments Call provider office for new concerns or questions  call the Suicide and Crisis Lifeline: 988 call the [place] National Suicide Prevention Lifeline: 623-631-9158 or TTY: 714-846-1233 TTY 919-316-2336) to talk to a trained counselor call 1-800-273-TALK (toll free, 24 hour hotline) if experiencing a Mental Health or [place]  avoid second hand smoke eliminate smoking in my home identify and  remove indoor air pollutants limit outdoor activity during cold weather listen for public air quality announcements every day do breathing exercises every day develop a rescue plan eliminate symptom triggers at home follow rescue plan if symptoms flare-up  Follow Up Plan: Telephone follow up appointment with care management team member scheduled for: 08-31-2022 at 345 pm          Plan:Telephone follow up appointment with care management team member scheduled for:  08-31-2022 at 345 pm  [place], MSN, CCM RN Care Manager  Chronic Care Management Direct Number: [phone]

## 2022-08-03 NOTE — Telephone Encounter (Signed)
Brent Ortiz is requesting a refill on his oxycodone.

## 2022-08-04 ENCOUNTER — Ambulatory Visit: Payer: Self-pay | Admitting: *Deleted

## 2022-08-04 NOTE — Patient Outreach (Addendum)
  Care Coordination   Follow Up Visit Note   08/04/2022 Name: Brent Ortiz MRN: [id number] DOB: Dec 13, 1960  Brent Ortiz is a 62 y.o. year old male who sees [name], Kirsten, MD for primary care. I spoke with  Brent Ortiz by phone today.  What matters to the patients health and wellness today?  No further needs    Goals Addressed             This Visit's Progress    COMPLETED: Care Coordination Assessment       Care Coordination Interventions: Reviewed Care Coordination Services:  Assessed Social Determinants of Health Advised patient and daughter that "walker with a seat" -rollator walker, is probably not covered by insurance but they can request a RX for one and take to a DME supplier and see, Also, advised of being able to purchase them on [employer] and other similar stores.  Daughter inquiring about using the U card for the rollator walker which is a great idea- she plans to do this. Pt reports he has a CAPS aide in the home for 8 hours/Mon-Fri, and 4 hours on Saturday. His daughter, Brent Ortiz, is also nearby and assists with meals, picking up meds,e tc.  Pt has a life alert button and denies wanting/needing to pursue facility placement. Pt feels his CAPS aide, daughters help and overall home setting is ok.  Pt wanting to place an orer/ request for his diapers- advised pt I did not work with that and to call the provider who supplies them Depression screen reviewed  Active listening / Reflection utilized  [place] strategies reviewed Pt reports a recent fall 2 weeks ago and is having Left sided pain- reports still "Sore" -encouraged pt to call PCP office if not getting better.   Transportation provided by insurance provider (Medicaid, [facility] and daughter also helps)  Solution-Focused Strategies employed:  Problem [place] strategies reviewed No further needs identified by  patient or family- CSW will sign off at this time today through assessment with  pt- aside from wanting a rollator walker.  CSW will update PCP to above            SDOH assessments and interventions completed:  Yes     Care Coordination Interventions:  Yes, provided   Follow up plan: No further intervention required.   Encounter Outcome:  Pt. Visit Completed

## 2022-08-04 NOTE — Patient Instructions (Signed)
Visit Information  Thank you for taking time to visit with me today. Please don't hesitate to contact me if I can be of assistance to you.   Following are the goals we discussed today:   Goals Addressed             This Visit's Progress    COMPLETED: Care Coordination Assessment       Care Coordination Interventions: Reviewed Care Coordination Services:  Assessed Social Determinants of Health Advised patient and daughter that "walker with a seat" -rollator walker, is probably not covered by insurance but they can request a RX for one and take to a DME supplier and see, Also, advised of being able to purchase them on [place] and other similar stores.  Daughter inquiring about using the U card for the rollator walker which is a great idea- she plans to do this. Pt reports he has a CAPS aide in the home for 8 hours/Mon-Fri, and 4 hours on Saturday. His daughter, Brent Ortiz, is also nearby and assists with meals, picking up meds,e tc.  Pt has a life alert button and denies wanting/needing to pursue facility placement. Pt feels his CAPS aide, daughters help and overall home setting is ok.  Pt wanting to place an orer/ request for his diapers- advised pt I did not work with that and to call the provider who supplies them Depression screen reviewed  Active listening / Reflection utilized  [place] strategies reviewed Pt reports a recent fall 2 weeks ago and is having Left sided pain- reports still "Sore" -encouraged pt to call PCP office if not getting better.   Transportation provided by insurance provider (Medicaid, [facility] and daughter also helps)  Solution-Focused Strategies employed:  Problem [place] strategies reviewed No further needs identified by  patient or family- CSW will sign off at this time today through assessment with pt- aside from wanting a rollator walker.  CSW will update PCP to above             The patient verbalized understanding of  instructions, educational materials, and care plan provided today and DECLINED offer to receive copy of patient instructions, educational materials, and care plan.   No further follow up required:    Eduard Clos, MSW, [place] Worker [employer] [employer] [phone]

## 2022-08-05 ENCOUNTER — Other Ambulatory Visit: Payer: Self-pay | Admitting: Family Medicine

## 2022-08-05 DIAGNOSIS — E1159 Type 2 diabetes mellitus with other circulatory complications: Secondary | ICD-10-CM

## 2022-08-05 DIAGNOSIS — F17210 Nicotine dependence, cigarettes, uncomplicated: Secondary | ICD-10-CM | POA: Diagnosis not present

## 2022-08-05 DIAGNOSIS — J449 Chronic obstructive pulmonary disease, unspecified: Secondary | ICD-10-CM | POA: Diagnosis not present

## 2022-08-05 DIAGNOSIS — I5020 Unspecified systolic (congestive) heart failure: Secondary | ICD-10-CM | POA: Diagnosis not present

## 2022-08-05 MED ORDER — LANSOPRAZOLE 30 MG PO CPDR: 30.0000 mg | DELAYED_RELEASE_CAPSULE | Freq: Every day | ORAL | 2 refills | Status: DC

## 2022-08-06 ENCOUNTER — Other Ambulatory Visit: Payer: Self-pay

## 2022-08-06 MED ORDER — METOPROLOL SUCCINATE ER 25 MG PO TB24: 25.0000 mg | ORAL_TABLET | Freq: Every day | ORAL | 3 refills | Status: DC

## 2022-08-12 ENCOUNTER — Telehealth: Payer: Self-pay

## 2022-08-12 NOTE — Telephone Encounter (Addendum)
11:25 AM - Spoke with patient and he needs some refill on medication but was not home and he will call back after lunch and give me the list he needs.  Patient called with questions about a medication change and labs.  Called patient back was unable to leave message due to voicemail being full.

## 2022-08-12 NOTE — Progress Notes (Signed)
Care Management & Coordination Services Pharmacy Team  Reason for Encounter: Appointment Reminder  Contacted patient to confirm telephone appointment with Arizona Constable, PharmD on 08/14/22 at 11:00 am.  Spoke with patient on 08/12/2022   Do you have any problems getting your medications? Yes If yes what types of problems are you experiencing? Access barriers, Pt medications were sent to [facility] instead of [place] and pt stated they didn't receive the scripts at first but then stated they had them so pt needs to call them today and make sure they have his meds ready.   What is your top health concern you would like to discuss at your upcoming visit? No top health concerns   Have you seen any other providers since your last visit with PCP? No   Chart review:  Recent office visits:  08/05/22 [name] MD. Orders Only. D/C Pantoprazole '40mg'$  and Started on Lansoprazole '30mg'$ .   07/16/22 Philipp Ovens CMA. Orders Only. D/C Metformin '500mg'$  and Started on Xigduo XR 5-'500mg'$  daily.   07/09/22 [name] MD. Seen for routine visit. Referral to Optometry. Increase allopurinol 300 mg daily. Decrease pantoprazole to 40 mg once daily  Recent consult visits:  None  Hospital visits:  None   Star Rating Drugs:  Medication:  Last Fill: Day Supply Dapagliflozin-Metformin Has not picked it up yet  Ozempic Received last week for 30ds through mail order pharmacy Rosuvastatin   07/12/22 93ds-12/15//23 31ds Sacubitril-Valsartan 08/09/22 90ds -07/08/21 31ds  Care Gaps: Annual wellness visit in last year? Yes, 02/10/22  If Diabetic: Last eye exam / retinopathy screening: 11/12/20 Last diabetic foot exam:07/09/22   Elray Mcgregor, [place] Pharmacist Assistant  [phone]

## 2022-08-13 NOTE — Telephone Encounter (Signed)
Patient called and stated he need refills on Zoloft,trazodone, Crestor and needed xigduo sent to [facility] pharmacy.   Made patient aware medication has been sent to pharmacy, called [facility] and spoke with tech, she stated that patient picked up zoloft, crestor, trazodone on 07/13/22 and insurance would not cover medication till March because he was sent in a 90 day supply, and that his xigduo was ready for pick up.  Made patient aware of information, he stated he did not pick any medication up from [facility].   Recommend patient go to [facility] and speak to a supervisor and maybe they can run the tape of the ate and see who pick up patient medication.   Patient Made Aware, Verbalized Understanding. Will call office back and let us know what's going on.

## 2022-08-14 ENCOUNTER — Ambulatory Visit: Payer: 59
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
## 2022-08-19 ENCOUNTER — Telehealth: Payer: Self-pay

## 2022-08-19 NOTE — Progress Notes (Signed)
Onboarding pt to [facility].   Called to go over medications-1st attempt lvm, 2nd attempt lvm, 3rd attempt lvm  Unsuccessful Outreach. Form will be sent to CPP.     Brent Ortiz, [facility] Pharmacist Assistant  [phone]

## 2022-08-24 ENCOUNTER — Other Ambulatory Visit: Payer: Self-pay

## 2022-08-25 ENCOUNTER — Telehealth: Payer: Self-pay

## 2022-08-25 NOTE — Telephone Encounter (Signed)
Patient called and stated he fell over the weekend and went to ED and they check him out and everything was fine, He is now having SOB and left lung is hurting when he cough, he is on his way to [facility] to get a yearly CT.  Recommended patient got down to the ED and get checked out, or go to urgent care, explain to him with the SOB and pain in chest. It could take up 2 to 3 days for CT to come back and he at least needs to be check out and listen to,not wait on the scan being that it's not a STAT scan.  Patient Made Aware, Verbalized Understanding.

## 2022-08-26 ENCOUNTER — Telehealth: Payer: Self-pay

## 2022-08-26 NOTE — Telephone Encounter (Signed)
     Patient  visit on 08/22/2022  at [facility] was for fall.  Have you been able to follow up with your primary care physician? Patient followed up with PCP via phone and plans on going to the urgent care 08/27/22 for shortness of breath and pain.  The patient was or was not able to obtain any needed medicine or equipment. No medication prescribed.  Are there diet recommendations that you are having difficulty following? No  Patient expresses understanding of discharge instructions and education provided has no other needs at this time. Yes   [facility] Resource Care Guide   [email or web address]  ?? [id number]   Website: [email or web address]  [email or web address]

## 2022-08-31 ENCOUNTER — Ambulatory Visit (INDEPENDENT_AMBULATORY_CARE_PROVIDER_SITE_OTHER): Payer: 59

## 2022-08-31 ENCOUNTER — Telehealth: Payer: Self-pay

## 2022-08-31 ENCOUNTER — Other Ambulatory Visit: Payer: Self-pay

## 2022-08-31 ENCOUNTER — Telehealth: Payer: 59

## 2022-08-31 DIAGNOSIS — I5020 Unspecified systolic (congestive) heart failure: Secondary | ICD-10-CM

## 2022-08-31 DIAGNOSIS — E1142 Type 2 diabetes mellitus with diabetic polyneuropathy: Secondary | ICD-10-CM

## 2022-08-31 DIAGNOSIS — I25118 Atherosclerotic heart disease of native coronary artery with other forms of angina pectoris: Secondary | ICD-10-CM

## 2022-08-31 DIAGNOSIS — I2510 Atherosclerotic heart disease of native coronary artery without angina pectoris: Secondary | ICD-10-CM

## 2022-08-31 DIAGNOSIS — F321 Major depressive disorder, single episode, moderate: Secondary | ICD-10-CM

## 2022-08-31 DIAGNOSIS — N401 Enlarged prostate with lower urinary tract symptoms: Secondary | ICD-10-CM

## 2022-08-31 DIAGNOSIS — L853 Xerosis cutis: Secondary | ICD-10-CM

## 2022-08-31 DIAGNOSIS — Z9181 History of falling: Secondary | ICD-10-CM

## 2022-08-31 DIAGNOSIS — J432 Centrilobular emphysema: Secondary | ICD-10-CM

## 2022-08-31 NOTE — Telephone Encounter (Signed)
Patient called stating that he went to the ER on 08/22/22 for a fall and ever since has had pain in his left side. Patient was informed that we had no appointments for the next two days and he would need to go to the nearest ER or Urgent care for the pain if it is un-tolerable seeing patient is already on pain medication. He states when he gets up it feels like something is cracking or something is rubbing together. Dr. Tobie Poet was consulted with and Per Dr. Tobie Poet there were no fractures seen on patient's imaging and everything looked normal. Patient could possibly have a chest contusion, so patient is to continue his oxycodone as normal and do ice packs and splint his chest and abdomen with a pillow when he can to help with the pain. Patient understood verbally and stated that he would do so.

## 2022-08-31 NOTE — Patient Instructions (Addendum)
Please call the care guide team at [phone] if you need to cancel or reschedule your appointment.   If you are experiencing a Mental Health or [place] or need someone to talk to, please call the Suicide and Crisis Lifeline: 988 call the [place] National Suicide Prevention Lifeline: (289)231-6738 or TTY: 813-795-0408 TTY 938-318-3629) to talk to a trained counselor call 1-800-273-TALK (toll free, 24 hour hotline) go to [facility] [phone])   Following is a copy of the CCM Program Consent:  CCM service includes personalized support from designated clinical staff supervised by the physician, including individualized plan of care and coordination with other care providers 24/7 contact phone numbers for assistance for urgent and routine care needs. Service will only be billed when office clinical staff spend 20 minutes or more in a month to coordinate care. Only one practitioner may furnish and bill the service in a calendar month. The patient may stop CCM services at amy time (effective at the end of the month) by phone call to the office staff. The patient will be responsible for cost sharing (co-pay) or up to 20% of the service fee (after annual deductible is met)  Following is a copy of your full provider care plan:   Goals Addressed             This Visit's Progress    CCM Expected Outcome:  Monitor, Self-Manage and Reduce Symptoms of Diabetes       Current Barriers:  Knowledge Deficits related to self care of chronic conditions and the importance of blood sugar checks, adherence to dietary measures, monitoring for changes, and reporting abnormal findings Care Coordination needs related to interdisciplinary collaboration and working together to help the patient in a patient with DM Chronic Disease Management support and education needs related to effective management of DM.  Lab Results  Component  Value Date   HGBA1C 7.5 (H) 07/09/2022    Planned Interventions: Provided education to patient about basic DM disease process. The patient states he does not check his blood sugars like he should and it is his fault. Encouraged the patient to check blood sugars as recommended by the provider. Reviewed medications with patient and discussed importance of medication adherence. The patient states compliance with medications. Works with pharm D.;        Reviewed prescribed diet with patient heart healthy/ADA diet. Education provided on heart healthy/ADA diet. Will send AVS with information on ADA diet ; Counseled on importance of regular laboratory monitoring as prescribed. The patient had labs drawn on 07-09-2022 and the most recent A1C was 7.5        Discussed plans with patient for ongoing care management follow up and provided patient with direct contact information for care management team;      Provided patient with written educational materials related to hypo and hyperglycemia and importance of correct treatment. Education on the sx and sx of hypo and hyperglycemia. Will send educational information in the mail to the patient on sx and sx;       Reviewed scheduled/upcoming provider appointments including: 10-13-2022 at 0940 am;         Advised patient, providing education and rationale, to check cbg twice daily and when you have symptoms of low or high blood sugar and record. The patient states he has not been checking his blood sugars. The patient states that he knows he should do a better job. Education on the benefits of  checking blood sugars on a regular basis and the goal of fasting of <130 and post prandial of <180. The patient agrees to start checking his blood sugars more frequently.         call provider for findings outside established parameters;       Referral made to pharmacy team for assistance with medication management and needs;       Referral made to social work team for assistance  with SDOH, ongoing support and education;      Review of patient status, including review of consultants reports, relevant laboratory and other test results, and medications completed;       Advised patient to discuss changes in DM and questions/Concerns with provider;      Screening for signs and symptoms of depression related to chronic disease state;        Assessed social determinant of health barriers;     The patient states it has been over 2 years since having an eye exam. Encouraged the patient to get an appointment to have his eyes checked. He has noticed changes in his vision. Education and support given. The patient is hard of hearing. Had hearing aides but lost them when his daughter moved him 3 years ago. Will collaborate with the MM nurses for ideas and help for hearing aides.  Currently there is no available services to help with hearing aide cost.  Falls precautions and safety reviewed with the patient. The patient is at high risk for falls. The patients last fall was on 08-22-2022 and he was evaluated in the ER. The patient states he is "sore". Education provided for deep breathing exercises to prevent increase risk of pneumonia. Education on safety and fall prevention. The patient needs assistance with adult diapers. The [employer] works with the [employer] [place] program with the [employer] bank of [place].  The patient may reach [facility] at [phone] for information about their diaper program and receiving supplies. The patient also may contact [employer] at: [phone] to see if they have adult diapers and also inquire about a walker with a seat at reduced cost. Will print and send information to the patient by mail for numbers to reach resources.   Symptom Management: Take medications as prescribed   Attend all scheduled provider appointments Call provider office for new concerns or questions  Work with the social worker to address care coordination  needs and will continue to work with the clinical team to address health care and disease management related needs call the Suicide and Crisis Lifeline: 988 call the [place] National Suicide Prevention Lifeline: (478) 559-3510 or TTY: 223-273-6905 TTY 281 239 9685) to talk to a trained counselor call 1-800-273-TALK (toll free, 24 hour hotline) if experiencing a Mental Health or [place]  schedule appointment with eye doctor check feet daily for cuts, sores or redness trim toenails straight across manage portion size wash and dry feet carefully every day wear comfortable, cotton socks wear comfortable, well-fitting shoes  Follow Up Plan: Telephone follow up appointment with care management team member scheduled for: 09-22-2022 at 345 pm       CCM Expected Outcome:  Monitor, Self-Manage and Reduce Symptoms of: Heart Failure       Current Barriers:  Knowledge Deficits related to the importance of daily weights and monitoring for changes in HF sx and sx Chronic Disease Management support and education needs related to HF Wt Readings from Last 3 Encounters:  07/09/22 297 lb (134.7 kg)  06/24/22  296 lb (134.3 kg)  03/30/22 (!) 301 lb (136.5 kg)     Planned Interventions: Basic overview and discussion of pathophysiology of Heart Failure reviewed. Education provided. The patient has a good understanding of his HF and how to effectively manage.  Provided education on low sodium diet. Education and review Reviewed Heart Failure Action Plan in depth and provided written copy. Information sent through myChart and will be mailed to the patients home.  Assessed need for readable accurate scales in home. Review of being safe when weighing Provided education about placing scale on hard, flat surface Advised patient to weigh each morning after emptying bladder Discussed importance of daily weight and advised patient to weigh and record daily Reviewed role of diuretics in prevention of  fluid overload and management of heart failure Discussed the importance of keeping all appointments with provider. Next appointment with the pcp is on 10-13-2022. The patient called the office today for follow up questions post fall. He will monitor for changes and will be seen at urgent care if  needed. Denies any acute distress at the time of the call.  Provided patient with education about the role of exercise in the management of heart failure Advised patient to discuss changes in HF or heart health with provider Screening for signs and symptoms of depression related to chronic disease state  Assessed social determinant of health barriers  Symptom Management: Take medications as prescribed   Attend all scheduled provider appointments Call provider office for new concerns or questions  call the Suicide and Crisis Lifeline: 988 call the [place] National Suicide Prevention Lifeline: 860-601-5538 or TTY: 952-854-5153 TTY 540-427-8535) to talk to a trained counselor call 1-800-273-TALK (toll free, 24 hour hotline) if experiencing a Mental Health or [place]  call office if I gain more than 2 pounds in one day or 5 pounds in one week keep legs up while sitting use salt in moderation watch for swelling in feet, ankles and legs every day weigh myself daily develop a rescue plan follow rescue plan if symptoms flare-up track symptoms and what helps feel better or worse dress right for the weather, hot or cold  Follow Up Plan: Telephone follow up appointment with care management team member scheduled for: 09-22-2022 at 345 pm       CCM:  Maintain, Monitor and Self-Manage Symptoms of COPD       Current Barriers:  Chronic Disease Management support and education needs related to effective management of COPD  Planned Interventions: Provided patient with basic written and verbal COPD education on self care/management/and exacerbation prevention. The patient denies any acute findings  related to his COPD. States he is stable.  Advised patient to track and manage COPD triggers. The patient gets easily short of breath especially with activity. The patient states that he has to pace his activity. The patient had a fall on 08-22-2022 and was seen in the ER. The patient states that it did not show that he fractured anything. The patient encouraged to cough and deep breath to prevent onset of pneumonia. Instructed the patient how to splint and do breathing exercises.  Provided written and verbal instructions on pursed lip breathing and utilized returned demonstration as teach back Provided instruction about proper use of medications used for management of COPD including inhalers Advised patient to self assesses COPD action plan zone and make appointment with provider if in the yellow zone for 48 hours without improvement. The patient aware of sx and sx of infection.  Review of monitoring for changes and letting the provider know of acute changes in his breathing.  Provided education about and advised patient to utilize infection prevention strategies to reduce risk of respiratory infection. Education provided Discussed the importance of adequate rest and management of fatigue with COPD Screening for signs and symptoms of depression related to chronic disease state  Assessed social determinant of health barriers  Symptom Management: Take medications as prescribed   Attend all scheduled provider appointments Call provider office for new concerns or questions  call the Suicide and Crisis Lifeline: 988 call the [place] National Suicide Prevention Lifeline: 4157903110 or TTY: 2533898403 TTY 548-503-0367) to talk to a trained counselor call 1-800-273-TALK (toll free, 24 hour hotline) if experiencing a Mental Health or [place]  avoid second hand smoke eliminate smoking in my home identify and remove indoor air pollutants limit outdoor activity during cold weather listen  for public air quality announcements every day do breathing exercises every day develop a rescue plan eliminate symptom triggers at home follow rescue plan if symptoms flare-up  Follow Up Plan: Telephone follow up appointment with care management team member scheduled for: 09-22-2022 at 345 pm          The patient verbalized understanding of instructions, educational materials, and care plan provided today and agreed to receive a mailed copy of patient instructions, educational materials, and care plan.   Telephone follow up appointment with care management team member scheduled for: 09-22-2022 at 2 pm  Understanding Your Risk for Falls Millions of people have serious injuries from falls each year. It is important to understand your risk of falling. Talk with your health care provider about your risk and what you can do to lower it. If you do have a serious fall, make sure to tell your provider. Falling once raises your risk of falling again. How can falls affect me? Serious injuries from falls are common. These include: Broken bones, such as hip fractures. Head injuries, such as traumatic brain injuries (TBI) or concussions. A fear of falling can cause you to avoid activities and stay at home. This can make your muscles weaker and raise your risk for a fall. What can increase my risk? There are a number of risk factors that increase your risk for falling. The more risk factors you have, the higher your risk of falling. Serious injuries from a fall happen most often to people who are older than 64 years old. Teenagers and young adults ages 78-29 are also at higher risk. Common risk factors include: Weakness in the lower body. Being generally weak or confused due to long-term (chronic) illness. Dizziness or balance problems. Poor vision. Medicines that cause dizziness or drowsiness. These may include: Medicines for your blood pressure, heart, anxiety, insomnia, or swelling  (edema). Pain medicines. Muscle relaxants. Other risk factors include: Drinking alcohol. Having had a fall in the past. Having foot pain or wearing improper footwear. Working at a dangerous job. Having any of the following in your home: Tripping hazards, such as floor clutter or loose rugs. Poor lighting. Pets. Having dementia or memory loss. What actions can I take to lower my risk of falling?     Physical activity Stay physically fit. Do strength and balance exercises. Consider taking a regular class to build strength and balance. Yoga and tai chi are good options. Vision Have your eyes checked every year and your prescription for glasses or contacts updated as needed. Shoes and walking aids Wear non-skid shoes. Wear  shoes that have rubber soles and low heels. Do not wear high heels. Do not walk around the house in socks or slippers. Use a cane or walker as told by your provider. Home safety Attach secure railings on both sides of your stairs. Install grab bars for your bathtub, shower, and toilet. Use a non-skid mat in your bathtub or shower. Attach bath mats securely with double-sided, non-slip rug tape. Use good lighting in all rooms. Keep a flashlight near your bed. Make sure there is a clear path from your bed to the bathroom. Use night-lights. Do not use throw rugs. Make sure all carpeting is taped or tacked down securely. Remove all clutter from walkways and stairways, including extension cords. Repair uneven or broken steps and floors. Avoid walking on icy or slippery surfaces. Walk on the grass instead of on icy or slick sidewalks. Use ice melter to get rid of ice on walkways in the winter. Use a cordless phone. Questions to ask your health care provider Can you help me check my risk for a fall? Do any of my medicines make me more likely to fall? Should I take a vitamin D supplement? What exercises can I do to improve my strength and balance? Should I make an  appointment to have my vision checked? Do I need a bone density test to check for weak bones (osteoporosis)? Would it help to use a cane or a walker? Where to find more information Centers for Disease Control and Prevention, [place]: StoreMirror.com.cy Community-Based Fall Prevention Programs: StoreMirror.com.cy [employer] on Aging: AquariamThe[employer].[email or web address].nz Contact a health care provider if: You fall at home. You are afraid of falling at home. You feel weak, drowsy, or dizzy. This information is not intended to replace advice given to you by your health care provider. Make sure you discuss any questions you have with your health care provider. Document Revised: 02/23/2022 Document Reviewed: 02/23/2022 Elsevier Patient Education  [place]. It is important to avoid accidents which may result in broken bones.  Here are a few ideas on how to make your home safer so you will be less likely to trip or fall.  Use nonskid mats or non slip strips in your shower or tub, on your bathroom floor and around sinks.  If you know that you have spilled water, wipe it up! In the bathroom, it is important to have properly installed grab bars on the walls or on the edge of the tub.  Towel racks are NOT strong enough for you to hold onto or to pull on for support. Stairs and hallways should have enough light.  Add lamps or night lights if you need ore light. It is good to have handrails on both sides of the stairs if possible.  Always fix broken handrails right away. It is important to see the edges of steps.  Paint the edges of outdoor steps white so you can see them better.  Put colored tape on the edge of inside steps. Throw-rugs are dangerous because they can slide.  Removing the rugs is the best idea, but if they must stay, add adhesive carpet tape to prevent slipping. Do not keep things on stairs or in the halls.  Remove small furniture that blocks the halls as it may cause you to trip.  Keep telephone and electrical cords out  of the way where you walk. Always were sturdy, rubber-soled shoes for good support.  Never wear just socks, especially on the stairs.  Socks may cause you  to slip or fall.  Do not wear full-length housecoats as you can easily trip on the bottom.  Place the things you use the most on the shelves that are the easiest to reach.  If you use a stepstool, make sure it is in good condition.  If you feel unsteady, DO NOT climb, ask for help. If a health professional advises you to use a cane or walker, do not be ashamed.  These items can keep you from falling and breaking your bones.

## 2022-08-31 NOTE — Chronic Care Management (AMB) (Signed)
Chronic Care Management   CCM RN Visit Note  08/31/2022 Name: TJUAN MELLER MRN: [id number] DOB: [birth year]  Subjective: TEGHAN SCHNEEKLOTH is a 62 y.o. year old male who is a primary care patient of [name], Kirsten, MD. The patient was referred to the Chronic Care Management team for assistance with care management needs subsequent to provider initiation of CCM services and plan of care.    Today's Visit:  Engaged with patient by telephone for follow up visit.        Goals Addressed             This Visit's Progress    CCM Expected Outcome:  Monitor, Self-Manage and Reduce Symptoms of Diabetes       Current Barriers:  Knowledge Deficits related to self care of chronic conditions and the importance of blood sugar checks, adherence to dietary measures, monitoring for changes, and reporting abnormal findings Care Coordination needs related to interdisciplinary collaboration and working together to help the patient in a patient with DM Chronic Disease Management support and education needs related to effective management of DM.  Lab Results  Component Value Date   HGBA1C 7.5 (H) 07/09/2022    Planned Interventions: Provided education to patient about basic DM disease process. The patient states he does not check his blood sugars like he should and it is his fault. Encouraged the patient to check blood sugars as recommended by the provider. Reviewed medications with patient and discussed importance of medication adherence. The patient states compliance with medications. Works with pharm D.;        Reviewed prescribed diet with patient heart healthy/ADA diet. Education provided on heart healthy/ADA diet. Will send AVS with information on ADA diet ; Counseled on importance of regular laboratory monitoring as prescribed. The patient had labs drawn on 07-09-2022 and the most recent A1C was 7.5        Discussed plans with patient for ongoing care management follow up and provided patient with  direct contact information for care management team;      Provided patient with written educational materials related to hypo and hyperglycemia and importance of correct treatment. Education on the sx and sx of hypo and hyperglycemia. Will send educational information in the mail to the patient on sx and sx;       Reviewed scheduled/upcoming provider appointments including: 10-13-2022 at 0940 am;         Advised patient, providing education and rationale, to check cbg twice daily and when you have symptoms of low or high blood sugar and record. The patient states he has not been checking his blood sugars. The patient states that he knows he should do a better job. Education on the benefits of checking blood sugars on a regular basis and the goal of fasting of <130 and post prandial of <180. The patient agrees to start checking his blood sugars more frequently.         call provider for findings outside established parameters;       Referral made to pharmacy team for assistance with medication management and needs;       Referral made to social work team for assistance with SDOH, ongoing support and education;      Review of patient status, including review of consultants reports, relevant laboratory and other test results, and medications completed;       Advised patient to discuss changes in DM and questions/Concerns with provider;      Screening for signs and  symptoms of depression related to chronic disease state;        Assessed social determinant of health barriers;     The patient states it has been over 2 years since having an eye exam. Encouraged the patient to get an appointment to have his eyes checked. He has noticed changes in his vision. Education and support given. The patient is hard of hearing. Had hearing aides but lost them when his daughter moved him 3 years ago. Will collaborate with the MM nurses for ideas and help for hearing aides.  Currently there is no available services to help  with hearing aide cost.  Falls precautions and safety reviewed with the patient. The patient is at high risk for falls. The patients last fall was on 08-22-2022 and he was evaluated in the ER. The patient states he is "sore". Education provided for deep breathing exercises to prevent increase risk of pneumonia. Education on safety and fall prevention. The patient needs assistance with adult diapers. The [employer] works with the [employer] program with the [employer] bank of [place].  The patient may reach [place] at [phone] for information about their diaper program and receiving supplies. The patient also may contact [employer] at: 817-035-2308 to see if they have adult diapers and also inquire about a walker with a seat at reduced cost. Will print and send information to the patient by mail for numbers to reach resources.   Symptom Management: Take medications as prescribed   Attend all scheduled provider appointments Call provider office for new concerns or questions  Work with the social worker to address care coordination needs and will continue to work with the clinical team to address health care and disease management related needs call the Suicide and Crisis Lifeline: 988 call the [place] National Suicide Prevention Lifeline: 8041824912 or TTY: 647-027-6614 TTY 954-856-6244) to talk to a trained counselor call 1-800-273-TALK (toll free, 24 hour hotline) if experiencing a Mental Health or [place]  schedule appointment with eye doctor check feet daily for cuts, sores or redness trim toenails straight across manage portion size wash and dry feet carefully every day wear comfortable, cotton socks wear comfortable, well-fitting shoes  Follow Up Plan: Telephone follow up appointment with care management team member scheduled for: 09-22-2022 at 345 pm       CCM Expected Outcome:  Monitor, Self-Manage and Reduce Symptoms of: Heart Failure        Current Barriers:  Knowledge Deficits related to the importance of daily weights and monitoring for changes in HF sx and sx Chronic Disease Management support and education needs related to HF Wt Readings from Last 3 Encounters:  07/09/22 297 lb (134.7 kg)  06/24/22 296 lb (134.3 kg)  03/30/22 (!) 301 lb (136.5 kg)     Planned Interventions: Basic overview and discussion of pathophysiology of Heart Failure reviewed. Education provided. The patient has a good understanding of his HF and how to effectively manage.  Provided education on low sodium diet. Education and review Reviewed Heart Failure Action Plan in depth and provided written copy. Information sent through myChart and will be mailed to the patients home.  Assessed need for readable accurate scales in home. Review of being safe when weighing Provided education about placing scale on hard, flat surface Advised patient to weigh each morning after emptying bladder Discussed importance of daily weight and advised patient to weigh and record daily Reviewed role of diuretics in prevention of fluid overload and  management of heart failure Discussed the importance of keeping all appointments with provider. Next appointment with the pcp is on 10-13-2022. The patient called the office today for follow up questions post fall. He will monitor for changes and will be seen at urgent care if  needed. Denies any acute distress at the time of the call.  Provided patient with education about the role of exercise in the management of heart failure Advised patient to discuss changes in HF or heart health with provider Screening for signs and symptoms of depression related to chronic disease state  Assessed social determinant of health barriers  Symptom Management: Take medications as prescribed   Attend all scheduled provider appointments Call provider office for new concerns or questions  call the Suicide and Crisis Lifeline: 988 call the [place]  National Suicide Prevention Lifeline: (470)579-9044 or TTY: (603)875-6643 TTY 563-289-7288) to talk to a trained counselor call 1-800-273-TALK (toll free, 24 hour hotline) if experiencing a Mental Health or [place]  call office if I gain more than 2 pounds in one day or 5 pounds in one week keep legs up while sitting use salt in moderation watch for swelling in feet, ankles and legs every day weigh myself daily develop a rescue plan follow rescue plan if symptoms flare-up track symptoms and what helps feel better or worse dress right for the weather, hot or cold  Follow Up Plan: Telephone follow up appointment with care management team member scheduled for: 09-22-2022 at 345 pm       CCM:  Maintain, Monitor and Self-Manage Symptoms of COPD       Current Barriers:  Chronic Disease Management support and education needs related to effective management of COPD  Planned Interventions: Provided patient with basic written and verbal COPD education on self care/management/and exacerbation prevention. The patient denies any acute findings related to his COPD. States he is stable.  Advised patient to track and manage COPD triggers. The patient gets easily short of breath especially with activity. The patient states that he has to pace his activity. The patient had a fall on 08-22-2022 and was seen in the ER. The patient states that it did not show that he fractured anything. The patient encouraged to cough and deep breath to prevent onset of pneumonia. Instructed the patient how to splint and do breathing exercises.  Provided written and verbal instructions on pursed lip breathing and utilized returned demonstration as teach back Provided instruction about proper use of medications used for management of COPD including inhalers Advised patient to self assesses COPD action plan zone and make appointment with provider if in the yellow zone for 48 hours without improvement. The patient  aware of sx and sx of infection. Review of monitoring for changes and letting the provider know of acute changes in his breathing.  Provided education about and advised patient to utilize infection prevention strategies to reduce risk of respiratory infection. Education provided Discussed the importance of adequate rest and management of fatigue with COPD Screening for signs and symptoms of depression related to chronic disease state  Assessed social determinant of health barriers  Symptom Management: Take medications as prescribed   Attend all scheduled provider appointments Call provider office for new concerns or questions  call the Suicide and Crisis Lifeline: 988 call the [place] National Suicide Prevention Lifeline: (431)882-0177 or TTY: (343)563-5008 TTY 617-680-8882) to talk to a trained counselor call 1-800-273-TALK (toll free, 24 hour hotline) if experiencing a Mental Health or [place]  avoid second hand smoke eliminate smoking in my home identify and remove indoor air pollutants limit outdoor activity during cold weather listen for public air quality announcements every day do breathing exercises every day develop a rescue plan eliminate symptom triggers at home follow rescue plan if symptoms flare-up  Follow Up Plan: Telephone follow up appointment with care management team member scheduled for: 09-22-2022 at 345 pm          Plan:Telephone follow up appointment with care management team member scheduled for:  09-22-2022 at 65 pm  [place], MSN, CCM RN Care Manager  Chronic Care Management Direct Number: [phone]

## 2022-09-01 MED ORDER — AMLODIPINE BESYLATE 5 MG PO TABS: 5.0000 mg | ORAL_TABLET | Freq: Every day | ORAL | 1 refills | Status: DC

## 2022-09-01 MED ORDER — ASPIRIN 81 MG PO TBEC: DELAYED_RELEASE_TABLET | ORAL | 1 refills | Status: DC

## 2022-09-01 MED ORDER — AMMONIUM LACTATE 12 % EX LOTN: 1.0000 | TOPICAL_LOTION | CUTANEOUS | 5 refills | Status: AC | PRN

## 2022-09-01 MED ORDER — LANSOPRAZOLE 30 MG PO CPDR: 30.0000 mg | DELAYED_RELEASE_CAPSULE | Freq: Every day | ORAL | 1 refills | Status: DC

## 2022-09-01 MED ORDER — SPIRONOLACTONE 25 MG PO TABS: ORAL_TABLET | ORAL | 1 refills | Status: DC

## 2022-09-01 MED ORDER — FAMOTIDINE 20 MG PO TABS: 20.0000 mg | ORAL_TABLET | Freq: Two times a day (BID) | ORAL | 1 refills | Status: DC

## 2022-09-01 MED ORDER — ALLOPURINOL 300 MG PO TABS: 300.0000 mg | ORAL_TABLET | Freq: Every day | ORAL | 3 refills | Status: DC

## 2022-09-01 MED ORDER — ISOSORBIDE MONONITRATE ER 60 MG PO TB24: 60.0000 mg | ORAL_TABLET | Freq: Every day | ORAL | 1 refills | Status: DC

## 2022-09-01 MED ORDER — GABAPENTIN 300 MG PO CAPS: ORAL_CAPSULE | ORAL | 1 refills | Status: DC

## 2022-09-01 MED ORDER — XIGDUO XR 5-500 MG PO TB24: 1.0000 | ORAL_TABLET | Freq: Every day | ORAL | 1 refills | Status: DC

## 2022-09-01 MED ORDER — SERTRALINE HCL 25 MG PO TABS: ORAL_TABLET | ORAL | 1 refills | Status: DC

## 2022-09-01 MED ORDER — METOPROLOL SUCCINATE ER 25 MG PO TB24: 25.0000 mg | ORAL_TABLET | Freq: Every day | ORAL | 1 refills | Status: DC

## 2022-09-01 MED ORDER — ROSUVASTATIN CALCIUM 40 MG PO TABS: 40.0000 mg | ORAL_TABLET | Freq: Every day | ORAL | 1 refills | Status: DC

## 2022-09-01 MED ORDER — CLOPIDOGREL BISULFATE 75 MG PO TABS: ORAL_TABLET | ORAL | 1 refills | Status: DC

## 2022-09-01 MED ORDER — ALBUTEROL SULFATE HFA 108 (90 BASE) MCG/ACT IN AERS: 2.0000 | INHALATION_SPRAY | RESPIRATORY_TRACT | 3 refills | Status: DC | PRN

## 2022-09-01 MED ORDER — ENTRESTO 24-26 MG PO TABS: ORAL_TABLET | ORAL | 0 refills | Status: DC

## 2022-09-01 MED ORDER — LANTUS SOLOSTAR 100 UNIT/ML ~~LOC~~ SOPN: PEN_INJECTOR | SUBCUTANEOUS | 3 refills | Status: DC

## 2022-09-01 MED ORDER — TAMSULOSIN HCL 0.4 MG PO CAPS: ORAL_CAPSULE | ORAL | 1 refills | Status: DC

## 2022-09-01 MED ORDER — OZEMPIC (0.25 OR 0.5 MG/DOSE) 2 MG/3ML ~~LOC~~ SOPN: PEN_INJECTOR | SUBCUTANEOUS | 1 refills | Status: DC

## 2022-09-01 MED ORDER — COLCHICINE 0.6 MG PO TABS: ORAL_TABLET | ORAL | 1 refills | Status: DC

## 2022-09-01 MED ORDER — RANOLAZINE ER 500 MG PO TB12: ORAL_TABLET | ORAL | 1 refills | Status: DC

## 2022-09-01 MED ORDER — OXYCODONE-ACETAMINOPHEN 5-325 MG PO TABS: 1.0000 | ORAL_TABLET | Freq: Four times a day (QID) | ORAL | 0 refills | Status: DC

## 2022-09-01 MED ORDER — TRAZODONE HCL 150 MG PO TABS: ORAL_TABLET | ORAL | 1 refills | Status: DC

## 2022-09-03 DIAGNOSIS — I5020 Unspecified systolic (congestive) heart failure: Secondary | ICD-10-CM | POA: Diagnosis not present

## 2022-09-03 DIAGNOSIS — F17210 Nicotine dependence, cigarettes, uncomplicated: Secondary | ICD-10-CM | POA: Diagnosis not present

## 2022-09-03 DIAGNOSIS — J449 Chronic obstructive pulmonary disease, unspecified: Secondary | ICD-10-CM | POA: Diagnosis not present

## 2022-09-03 DIAGNOSIS — E1159 Type 2 diabetes mellitus with other circulatory complications: Secondary | ICD-10-CM

## 2022-09-10 ENCOUNTER — Other Ambulatory Visit: Payer: Self-pay

## 2022-09-10 DIAGNOSIS — I25118 Atherosclerotic heart disease of native coronary artery with other forms of angina pectoris: Secondary | ICD-10-CM

## 2022-09-10 MED ORDER — TRELEGY ELLIPTA 100-62.5-25 MCG/ACT IN AEPB: INHALATION_SPRAY | RESPIRATORY_TRACT | 2 refills | Status: DC

## 2022-09-10 MED ORDER — CLOPIDOGREL BISULFATE 75 MG PO TABS: ORAL_TABLET | ORAL | 1 refills | Status: DC

## 2022-09-15 ENCOUNTER — Ambulatory Visit (INDEPENDENT_AMBULATORY_CARE_PROVIDER_SITE_OTHER): Payer: 59 | Admitting: Podiatry

## 2022-09-15 DIAGNOSIS — M79609 Pain in unspecified limb: Secondary | ICD-10-CM

## 2022-09-15 DIAGNOSIS — B351 Tinea unguium: Secondary | ICD-10-CM | POA: Diagnosis not present

## 2022-09-15 DIAGNOSIS — E1142 Type 2 diabetes mellitus with diabetic polyneuropathy: Secondary | ICD-10-CM

## 2022-09-15 NOTE — Progress Notes (Signed)
  Subjective:  Patient ID: Brent Ortiz, male    DOB: 25-Jan-1961,  MRN: [id number]  Chief Complaint  Patient presents with   Nail Problem    Diabetic Foot Care     62 y.o. male presents with the above complaint. History confirmed with patient. Patient presenting with pain related to dystrophic thickened elongated nails. Patient is unable to trim own nails related to nail dystrophy and/or mobility issues. Patient does have a history of T2DM. Patient does have history of peripheral neuropathy  Objective:  Physical Exam: warm, good capillary refill nail exam onychomycosis of the toenails, onycholysis, and dystrophic nails DP pulses palpable, PT pulses palpable, and protective sensation absent Left Foot:  Pain with palpation of nails due to elongation and dystrophic growth.  Right Foot: Pain with palpation of nails due to elongation and dystrophic growth.   Assessment:   1. Pain due to onychomycosis of nail   2. Diabetic polyneuropathy associated with type 2 diabetes mellitus ([place])      Plan:  Patient was evaluated and treated and all questions answered.   #Onychomycosis with pain  -Nails palliatively debrided as below. -Educated on self-care  Procedure: Nail Debridement Rationale: Pain Type of Debridement: manual, sharp debridement. Instrumentation: Nail nipper, rotary burr. Number of Nails: 10  Return in about 3 months (around 12/16/2022) for [facility].         Everitt Amber, DPM [facility]

## 2022-09-16 ENCOUNTER — Other Ambulatory Visit: Payer: Self-pay

## 2022-09-17 ENCOUNTER — Ambulatory Visit: Payer: Medicare Other | Admitting: Podiatry

## 2022-09-22 ENCOUNTER — Telehealth: Payer: 59

## 2022-09-22 ENCOUNTER — Telehealth: Payer: Self-pay

## 2022-09-22 NOTE — Telephone Encounter (Signed)
   CCM RN Visit Note  09-22-2022  Name: [name] MRN: [id number]      DOB: Aug 19, 1960  Subjective: Brent Ortiz is a 62 y.o. year old male who is a primary care patient of Dr. [name] The patient was referred to the Chronic Care Management team for assistance with care management needs subsequent to provider initiation of CCM services and plan of care.      An unsuccessful telephone outreach was attempted today to contact the patient about Chronic Care Management needs.    Plan:The care management team will reach out to the patient again over the next 30 days.  [name] RN, MSN, CCM RN Care Manager  Chronic Care Management Direct Number: [phone]

## 2022-09-28 ENCOUNTER — Other Ambulatory Visit: Payer: Self-pay | Admitting: Family Medicine

## 2022-10-06 ENCOUNTER — Other Ambulatory Visit: Payer: Self-pay | Admitting: Family Medicine

## 2022-10-06 DIAGNOSIS — K219 Gastro-esophageal reflux disease without esophagitis: Secondary | ICD-10-CM

## 2022-10-07 ENCOUNTER — Other Ambulatory Visit: Payer: Self-pay | Admitting: Family Medicine

## 2022-10-07 ENCOUNTER — Other Ambulatory Visit: Payer: Self-pay

## 2022-10-07 DIAGNOSIS — K219 Gastro-esophageal reflux disease without esophagitis: Secondary | ICD-10-CM

## 2022-10-08 ENCOUNTER — Other Ambulatory Visit: Payer: Self-pay

## 2022-10-08 MED ORDER — PANTOPRAZOLE SODIUM 40 MG PO TBEC: 40.0000 mg | DELAYED_RELEASE_TABLET | Freq: Every day | ORAL | 1 refills | Status: DC

## 2022-10-13 ENCOUNTER — Encounter: Payer: Self-pay | Admitting: Family Medicine

## 2022-10-13 ENCOUNTER — Ambulatory Visit (INDEPENDENT_AMBULATORY_CARE_PROVIDER_SITE_OTHER): Payer: 59 | Admitting: Family Medicine

## 2022-10-13 VITALS — BP 112/70 | HR 103 | Temp 97.4°F | Ht 68.0 in | Wt 307.0 lb

## 2022-10-13 DIAGNOSIS — R911 Solitary pulmonary nodule: Secondary | ICD-10-CM

## 2022-10-13 DIAGNOSIS — I10 Essential (primary) hypertension: Secondary | ICD-10-CM

## 2022-10-13 DIAGNOSIS — F1120 Opioid dependence, uncomplicated: Secondary | ICD-10-CM

## 2022-10-13 DIAGNOSIS — G894 Chronic pain syndrome: Secondary | ICD-10-CM

## 2022-10-13 DIAGNOSIS — K219 Gastro-esophageal reflux disease without esophagitis: Secondary | ICD-10-CM

## 2022-10-13 DIAGNOSIS — I25118 Atherosclerotic heart disease of native coronary artery with other forms of angina pectoris: Secondary | ICD-10-CM

## 2022-10-13 DIAGNOSIS — Z114 Encounter for screening for human immunodeficiency virus [HIV]: Secondary | ICD-10-CM

## 2022-10-13 DIAGNOSIS — Z794 Long term (current) use of insulin: Secondary | ICD-10-CM

## 2022-10-13 DIAGNOSIS — E1142 Type 2 diabetes mellitus with diabetic polyneuropathy: Secondary | ICD-10-CM | POA: Diagnosis not present

## 2022-10-13 DIAGNOSIS — E782 Mixed hyperlipidemia: Secondary | ICD-10-CM

## 2022-10-13 DIAGNOSIS — F330 Major depressive disorder, recurrent, mild: Secondary | ICD-10-CM

## 2022-10-13 DIAGNOSIS — Z6841 Body Mass Index (BMI) 40.0 and over, adult: Secondary | ICD-10-CM

## 2022-10-13 DIAGNOSIS — Z1159 Encounter for screening for other viral diseases: Secondary | ICD-10-CM

## 2022-10-13 DIAGNOSIS — N3946 Mixed incontinence: Secondary | ICD-10-CM

## 2022-10-13 DIAGNOSIS — M545 Low back pain, unspecified: Secondary | ICD-10-CM

## 2022-10-13 HISTORY — DX: Solitary pulmonary nodule: R91.1

## 2022-10-13 HISTORY — DX: Encounter for screening for human immunodeficiency virus (HIV): Z11.4

## 2022-10-13 MED ORDER — SEMAGLUTIDE (1 MG/DOSE) 4 MG/3ML ~~LOC~~ SOPN
1.0000 mg | PEN_INJECTOR | SUBCUTANEOUS | 0 refills | Status: DC
Start: 2022-10-13 — End: 2022-11-23

## 2022-10-13 MED ORDER — BUPRENORPHINE 10 MCG/HR TD PTWK
1.0000 | MEDICATED_PATCH | TRANSDERMAL | 0 refills | Status: DC
Start: 2022-10-13 — End: 2022-11-10

## 2022-10-13 NOTE — Assessment & Plan Note (Addendum)
Well controlled.  No medication changes recommended. Continue rosuvastatin 40 mg before bed and zetia 10 mg before bed.  Continue healthy diet and exercise.   Check labs

## 2022-10-13 NOTE — Assessment & Plan Note (Signed)
Lung ct scan due for recheck.

## 2022-10-13 NOTE — Assessment & Plan Note (Addendum)
Well controlled.  No medication changes recommended. Continue Spironolactone 25 mg  daily, metoprolol 25 mg twice a day, amlodipine 5 mg daily..   Continue healthy diet and exercise.

## 2022-10-13 NOTE — Assessment & Plan Note (Signed)
Recommend continue to work on eating healthy diet and exercise.  

## 2022-10-13 NOTE — Assessment & Plan Note (Signed)
-   Check UDS 

## 2022-10-13 NOTE — Progress Notes (Addendum)
Subjective:  Patient ID: Brent Ortiz, male    DOB: [birth year]  Age: 62 y.o. MRN: [id number]  Chief Complaint  Patient presents with   Diabetes   Hyperlipidemia   Hypertension       DMII: Complicated by hyperlipidemia, hypertension, polyneuropathy.   Checks sugars 2-3 times per week.  Sugars:130-143 Current medicines: Lantus 30 units daily, metformin 500 mg daily, and ozempic 0.5 mg weekly.   Checks feet most days.  Over due for eye doctor. Needs to schedule appt. Last A1C was 7.5   Hyperlipidemia: Rosuvastatin 40 mg daily, Zetia 10 mg daily.   Hypertension: On Spironolactone 25 mg  daily, metoprolol 25 mg twice a day, amlodipine 5 mg daily..  Patient is working on [employer]. Unable to exercise.     CONGESTIVE HEART FAILURE/CAD: He takes Entresto 24-26 mg twice a day, Isosorbide 60 mg daily, Spironolactone 25 mg daily, Ranexa 500 mg twice daily, metoprolol XL 25 mg daily. Cardiologist   Gout:   Allopurinol 100 mg daily. Colchicine as needed.    Constipation: on miralax 17 mg daily helps.   GERD: pepcid 20 mg twice daily. Protonix 40 mg one twice daily. Has not tried lower dose of protonix.   Insomnia: on trazodone 150 mg before bed. Works Firefighter.   Depression: on zoloft 25 mg daily.   Chronic Back Pain: Oxycodone 5-325 mg QID, pain scale 7/10. Needs rollator walker to assist with ambulation and ADLs. Bladder incontinence: needs chux for incontinence management and hygiene.     11/10/2022   10:57 AM 10/13/2022    9:49 AM 07/24/2022    1:39 PM 07/09/2022    9:20 AM 06/24/2022    2:16 PM  Depression screen PHQ 2/9  Decreased Interest 1 0 0 0 0  Down, Depressed, Hopeless 1 0 0 0 0  PHQ - 2 Score 2 0 0 0 0  Altered sleeping 1  0 0 2  Tired, decreased energy 1  0 0 2  Change in appetite 2  0 0 0  Feeling bad or failure about yourself  2  0 0 0  Trouble concentrating 1  0 0 0  Moving slowly or fidgety/restless 1  0 0 0  Suicidal thoughts 0  0 0 0  PHQ-9 Score 10   0 0 4  Difficult doing work/chores Somewhat difficult  Not difficult at all Not difficult at all Not difficult at all         06/24/2022    2:15 PM 07/09/2022    9:19 AM 08/03/2022    3:17 PM 08/31/2022    2:21 PM 11/10/2022   10:57 AM  Fall Risk  Falls in the past year? 1 1 1 1 1   Was there an injury with Fall? 0 1 1 1 1   Fall Risk Category Calculator 2 3 3 3 3   Fall Risk Category (Retired) Moderate High     (RETIRED) Patient Fall Risk Level Moderate fall risk Moderate fall risk     Patient at Risk for Falls Due to Impaired balance/gait History of fall(s) History of fall(s);Impaired balance/gait History of fall(s);Impaired balance/gait;Impaired mobility;Other (Comment) Impaired mobility;Impaired balance/gait  Patient at Risk for Falls Due to - Comments    last fall with injury on 08-22-2022   Fall risk Follow up Falls evaluation completed Falls evaluation completed Falls evaluation completed;Education provided;Falls prevention discussed Falls evaluation completed;Education provided;Falls prevention discussed Falls evaluation completed;Falls prevention discussed      Review of Systems  Constitutional:  Negative for chills, diaphoresis, fatigue and fever.  HENT:  Negative for congestion, ear pain and sore throat.   Respiratory:  Negative for cough and shortness of breath.   Cardiovascular:  Negative for chest pain and leg swelling.  Gastrointestinal:  Negative for abdominal pain, constipation, diarrhea, nausea and vomiting.  Genitourinary:  Negative for dysuria and urgency.  Musculoskeletal:  Positive for back pain. Negative for arthralgias and myalgias.  Neurological:  Negative for dizziness and headaches.  Psychiatric/Behavioral:  Negative for dysphoric mood.     Current Outpatient Medications on File Prior to Visit  Medication Sig Dispense Refill   Accu-Chek Softclix Lancets lancets Use as instructed 100 each 5   albuterol (VENTOLIN HFA) 108 (90 Base) MCG/ACT inhaler Inhale 2 puffs  into the lungs every 4 (four) hours as needed for wheezing or shortness of breath. 25.5 g 3   allopurinol (ZYLOPRIM) 300 MG tablet Take 1 tablet (300 mg total) by mouth daily. 90 tablet 3   amLODipine (NORVASC) 5 MG tablet Take 1 tablet (5 mg total) by mouth daily. 90 tablet 1   ammonium lactate (LAC-HYDRIN) 12 % lotion Apply 1 Application topically as needed for dry skin. 400 g 5   aspirin EC (GOODSENSE ASPIRIN LOW DOSE) 81 MG tablet TAKE 1 TABLET (81MG ) BY MOUTH DAILY. SWALLOW WHOLE.(YELLOW ROUND TAB WITH A HEART) 90 tablet 1   Blood Glucose Monitoring Suppl (ACCU-CHEK GUIDE) w/Device KIT 1 each by Does not apply route in the morning, at noon, and at bedtime. 1 kit 0   clopidogrel (PLAVIX) 75 MG tablet TAKE 1 TABLET BY MOUTH ONCE DAILY (ROUND PINK TABLET WITH E 34) 90 tablet 1   colchicine 0.6 MG tablet TAKE 1 TABLET(0.6 MG) BY MOUTH TWICE DAILY FOR 7 DAYS THEN TAKE 1 TABLET(0.6 MG) BY MOUTH DAILY FOR 23 DAYS 180 tablet 1   ezetimibe (ZETIA) 10 MG tablet Take 10 mg by mouth daily. (Patient not taking: Reported on 08/14/2022)     Glucagon, rDNA, (GLUCAGON EMERGENCY) 1 MG KIT Inject 1 mg into the muscle as needed (BG less than 70).     glucose blood (ACCU-CHEK GUIDE) test strip Use as instructed 100 each 5   isosorbide mononitrate (IMDUR) 60 MG 24 hr tablet Take 1 tablet (60 mg total) by mouth daily. 90 tablet 1   metoprolol succinate (TOPROL-XL) 25 MG 24 hr tablet Take 1 tablet (25 mg total) by mouth daily. 90 tablet 1   nitroGLYCERIN (NITROSTAT) 0.4 MG SL tablet DISSOLVE 1 TABLET UNDER TONGUE EVERY 5 MINUTES AS NEEDED CHEST PAIN. 25 tablet 1   pantoprazole (PROTONIX) 40 MG tablet Take 1 tablet (40 mg total) by mouth daily. 90 tablet 1   polyethylene glycol (MIRALAX / GLYCOLAX) 17 g packet Take 17 g by mouth daily.     ranolazine (RANEXA) 500 MG 12 hr tablet TAKE ONE TABLET BY MOUTH TWICE DAILY (blue oblong tablet with H R18, or PINK OBLONG TABLET WITH RL49)) 186 tablet 0   rosuvastatin (CRESTOR) 40  MG tablet Take 1 tablet (40 mg total) by mouth daily. 90 tablet 1   sacubitril-valsartan (ENTRESTO) 24-26 MG TAKE 1 TABLET BY MOUTH 2 TIMES DAILY.(OBLONG PINK TAB WITH LZ NVR) 186 tablet 0   sertraline (ZOLOFT) 25 MG tablet TAKE 1 TABLET (25MG ) BY MOUTH DAILY.(OBLONG GREEN TABLET WITH A 16) 90 tablet 1   spironolactone (ALDACTONE) 25 MG tablet TAKE 1/2 TABLET(12.5) BY MOUTH DAILY. 135 tablet 1   tamsulosin (FLOMAX) 0.4 MG CAPS capsule TAKE 1 CAPSULE (.  4MG  TOTAL) BY MOUTH DAILY.(GREEN AND GOLD CAPSULE WITH W 516) 90 capsule 1   traZODone (DESYREL) 150 MG tablet TAKE 1 TABLET(150MG ) BY MOUTH AT BEDTIME (OVAL WHITE TABLET WITH 8 07, or 13 32 & 50 50 50) 90 tablet 1   TRELEGY ELLIPTA 100-62.5-25 MCG/ACT AEPB INHALE 1 PUFF ONCE DAILY 60 each 2   No current facility-administered medications on file prior to visit.   Past Medical History:  Diagnosis Date   Acute combined systolic and diastolic CHF, NYHA class 2 (HCC) 01/22/2020   Acute gout of left elbow 02/14/2022   Acute idiopathic gout of right wrist 12/29/2021   Alcohol abuse    Atherosclerosis of coronary artery of native heart with stable angina pectoris, unspecified vessel or lesion type (HCC) 01/23/2016   Overview:  Completely occluded circumflex artery proximal to obtuse marginal 1 basin cardiac catheter from 2017   Atherosclerosis of native coronary artery of native heart without angina pectoris 01/23/2016   Overview:  Completely occluded circumflex artery proximal to obtuse marginal 1 basin cardiac catheter from 2017   Bilateral hip pain 11/17/2021   BMI 45.0-49.9, adult (HCC) 01/13/2013   Cellulitis of right upper extremity 01/01/2022   Centrilobular emphysema (HCC) 01/16/2022   Chronic kidney disease    Chronic pain 10/19/2019   Sees pain clinic   COPD (chronic obstructive pulmonary disease) (HCC)    COPD exacerbation (HCC) 10/03/2019   Coronary artery disease involving native heart 02/08/2020   Coronary atherosclerosis of native coronary  artery 01/23/2016   Overview:  Completely occluded circumflex artery proximal to obtuse marginal 1 basin cardiac catheter from 2017   Depression, major, single episode, moderate (HCC) 07/11/2020   Diabetic polyneuropathy (HCC) 12/06/2019   ED (erectile dysfunction) 12/06/2019   Essential hypertension 01/23/2016   Essential thrombocythemia (HCC) 09/29/2011   GERD (gastroesophageal reflux disease) 11/17/2021   GERD without esophagitis    HFrEF (heart failure with reduced ejection fraction) (HCC) 02/08/2020   Knee pain, left 02/12/2012   Formatting of this note might be different from the original. Left   Leukocytosis 09/04/2011   Lumbar pain 11/17/2021   Mild major depression, single episode (HCC) 02/14/2022   Mixed hyperlipidemia 06/18/2017   Mixed incontinence 02/24/2022   Morbid obesity (HCC) 11/21/2020   Morbid obesity with BMI of 40.0-44.9, adult (HCC) 01/13/2013   Myocardial infarction (HCC)    04-13-2011   Obstructive chronic bronchitis with exacerbation (HCC) 10/03/2019   Obstructive sleep apnea 07/18/2011   Olecranon bursitis of left elbow 02/14/2022   Olecranon bursitis of left elbow 02/14/2022   Opiate abuse, continuous (HCC)    Osteoarthritis    Pain in knee joint 02/12/2012   Formatting of this note might be different from the original. Left   Personal history of stroke with residual effects 07/24/2021   Pneumonia    Polysubstance abuse (HCC)    Poor dentition    Stage 3b chronic kidney disease (HCC) 12/27/2020   Thrombocytosis 09/29/2011   Tobacco dependence 01/23/2016   Tobacco use disorder 01/23/2016   Type 2 diabetes mellitus with diabetic polyneuropathy [facility])    Past Surgical History:  Procedure Laterality Date   HERNIA REPAIR  2009   I & D KNEE WITH POLY EXCHANGE  09/03/2011   Procedure: IRRIGATION AND DEBRIDEMENT KNEE WITH POLY EXCHANGE;  Surgeon: Shelda Pal, MD;  Location: [facility];  Service: Orthopedics;  Laterality: Left;   TOOTH EXTRACTION Bilateral 08/06/2017    Procedure: DENTAL RESTORATION/EXTRACTIONS;  Surgeon: Ocie Doyne, DDS;  Location:  [facility] OR;  Service: Oral Surgery;  Laterality: Bilateral;   TOTAL KNEE ARTHROPLASTY   right  feb 2012   left march 2012 r   TOTAL KNEE REVISION  06/23/2011   Procedure: TOTAL KNEE REVISION;  Surgeon: Shelda Pal;  Location: [facility];  Service: Orthopedics;  Laterality: Left;  femomal nerve block done in holding area without incident    Family History  Problem Relation Age of Onset   Colon cancer Other    Colon cancer Mother    Social History   Socioeconomic History   Marital status: Widowed    Spouse name: Not on file   Number of children: 5   Years of education: Not on file   Highest education level: Not on file  Occupational History   Occupation: Disabled  Tobacco Use   Smoking status: Every Day    Packs/day: 1.00    Years: 50.00    Additional pack years: 0.00    Total pack years: 50.00    Types: Cigarettes   Smokeless tobacco: Never  Vaping Use   Vaping Use: Former  Substance and Sexual Activity   Alcohol use: Not on file    Comment: heavy drinker in past. occasionally has a beer. Not on regular basis.   Drug use: Not Currently   Sexual activity: Not Currently  Other Topics Concern   Not on file  Social History Narrative   Not on file   Social Determinants of Health   Financial Resource Strain: Low Risk  (08/14/2022)   Overall Financial Resource Strain (CARDIA)    Difficulty of Paying Living Expenses: Not hard at all  Food Insecurity: No Food Insecurity (08/03/2022)   Hunger Vital Sign    Worried About Running Out of Food in the Last Year: Never true    Ran Out of Food in the Last Year: Never true  Transportation Needs: No Transportation Needs (08/14/2022)   PRAPARE - Administrator, Civil Service (Medical): No    Lack of Transportation (Non-Medical): No  Physical Activity: Inactive (07/24/2022)   Exercise Vital Sign    Days of Exercise per Week: 0 days    Minutes of  Exercise per Session: 0 min  Stress: No Stress Concern Present (08/03/2022)   [employer] of Occupational Health - Occupational Stress Questionnaire    Feeling of Stress : Not at all  Social Connections: Socially Isolated (08/03/2022)   Social Connection and Isolation Panel [NHANES]    Frequency of Communication with Friends and Family: More than three times a week    Frequency of Social Gatherings with Friends and Family: More than three times a week    Attends Religious Services: Never    Database administrator or Organizations: No    Attends Banker Meetings: Never    Marital Status: Widowed    Objective:  BP 112/70   Pulse (!) 103   Temp (!) 97.4 F (36.3 C)   Ht 5\' 8"  (1.727 m)   Wt (!) 307 lb (139.3 kg)   SpO2 98%   BMI 46.68 kg/m      11/10/2022   10:52 AM 10/13/2022    9:47 AM 07/09/2022    9:26 AM  BP/Weight  Systolic BP 130 112 116  Diastolic BP 80 70 78  Wt. (Lbs) 309 307   BMI 46.98 kg/m2 46.68 kg/m2     Physical Exam Vitals reviewed.  Constitutional:      Appearance: Normal appearance. He is obese.  Cardiovascular:     Rate and Rhythm: Normal rate and regular rhythm.     Heart sounds: No murmur heard. Pulmonary:     Effort: Pulmonary effort is normal.     Breath sounds: Normal breath sounds.  Abdominal:     General: Abdomen is flat. Bowel sounds are normal.     Palpations: Abdomen is soft.     Tenderness: There is no abdominal tenderness.  Musculoskeletal:        General: Tenderness (lumbar) present.  Neurological:     Mental Status: He is alert and oriented to person, place, and time.     Gait: Gait abnormal.  Psychiatric:        Mood and Affect: Mood normal.        Behavior: Behavior normal.     Diabetic Foot Exam - Simple   Simple Foot Form Diabetic Foot exam was performed with the following findings: Yes 10/13/2022 10:04 AM  Visual Inspection No deformities, no ulcerations, no other skin breakdown bilaterally: Yes See  comments: Yes Sensation Testing Intact to touch and monofilament testing bilaterally: Yes Pulse Check Posterior Tibialis and Dorsalis pulse intact bilaterally: Yes Comments Medial right ankle warm, red and swollen.      Lab Results  Component Value Date   WBC 11.5 (H) 10/13/2022   HGB 14.5 10/13/2022   HCT 42.9 10/13/2022   PLT 511 (H) 10/13/2022   GLUCOSE 188 (H) 10/13/2022   CHOL 176 10/13/2022   TRIG 127 10/13/2022   HDL 55 10/13/2022   LDLCALC 99 10/13/2022   ALT 11 10/13/2022   AST 15 10/13/2022   NA 138 10/13/2022   K 5.2 10/13/2022   CL 99 10/13/2022   CREATININE 1.10 10/13/2022   BUN 12 10/13/2022   CO2 23 10/13/2022   TSH 1.140 07/09/2022   INR 1.81 (H) 09/07/2011   HGBA1C 8.0 (H) 10/13/2022   MICROALBUR 80 09/27/2020      Assessment & Plan:    Type 2 diabetes mellitus with diabetic polyneuropathy, with long-term current use of insulin (HCC) Assessment & Plan: Well controlled. Recommend check sugars fasting daily. Recommend check feet daily. Recommend annual eye exams. Continue to work on eating a healthy diet and exercise.  Labs drawn today.    Increase ozempic to 1 mg daily Continue Lantus 30 units daily, metformin 500 mg daily  Orders: -     CBC with Differential/Platelet -     Hemoglobin A1c  Mixed hyperlipidemia Assessment & Plan: Well controlled.  No medication changes recommended. Continue rosuvastatin 40 mg before bed and zetia 10 mg before bed.  Continue healthy diet and exercise.   Check labs  Orders: -     Lipid panel  Gastroesophageal reflux disease without esophagitis Assessment & Plan: Well controlled.  No changes to medicines. Continue pepcid 20 mg twice daily. Protonix 40 mg one twice daily.    Chronic pain syndrome Assessment & Plan: Not at goal. Start butrans 10 mcg/hr apply one patch weekly.  Recommend oxycodone/apap 5/325 mg one oral twice daily as needed.  Check UDS  Orders: -     Pain Mgt Scrn (14 Drugs),  Ur  Essential hypertension Assessment & Plan: Well controlled.  No medication changes recommended. Continue Spironolactone 25 mg  daily, metoprolol 25 mg twice a day, amlodipine 5 mg daily..   Continue healthy diet and exercise.    Orders: -     Comprehensive metabolic panel  Lung nodule Assessment & Plan: Lung ct scan due for recheck.  Screening for HIV (human immunodeficiency virus) -     HIV Antibody (routine testing w rflx)  Encounter for hepatitis C screening test for low risk patient -     HCV Ab w Reflex to Quant PCR  Uncomplicated opioid dependence (HCC) Assessment & Plan: Check UDS.   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  Recommend continue Entresto 24-26 mg twice a day, Isosorbide 60 mg daily, Spironolactone 25 mg daily, Ranexa 500 mg twice daily, metoprolol XL 25 mg daily.      Depression, major, recurrent, mild (HCC) Assessment & Plan: The current medical regimen is effective;  continue present plan and medications. Continue zoloft 25 mg daily.    Morbid obesity with BMI of 45.0-49.9, adult [facility]) Assessment & Plan: Recommend continue to work on eating healthy diet and exercise.    Lumbar pain Assessment & Plan: Recommend rollator walker for ambulation and ADLs.   Mixed stress and urge urinary incontinence Assessment & Plan: Needs Chux for incontinence management and for hygiene purposes.       Meds ordered this encounter  Medications   DISCONTD: Semaglutide, 1 MG/DOSE, 4 MG/3ML SOPN    Sig: Inject 1 mg as directed once a week.    Dispense:  3 mL    Refill:  0   DISCONTD: buprenorphine (BUTRANS) 10 MCG/HR PTWK    Sig: Place 1 patch onto the skin once a week.    Dispense:  4 patch    Refill:  0    Orders Placed This Encounter  Procedures   CBC with Differential/Platelet    Comprehensive metabolic panel   Lipid panel   Hemoglobin A1c   HIV Antibody (routine testing w rflx)   HCV Ab w Reflex to Quant PCR   Pain Mgt Scrn (14 Drugs), Ur     Follow-up: Return in about 3 months (around 01/12/2023) for chronic, fasting.  Total time spent on today's visit was greater than 30 minutes, including both face-to-face time and nonface-to-face time personally spent on review of chart (labs and imaging), discussing labs and goals, discussing further work-up, treatment options, referrals to specialist if needed, reviewing outside records of pertinent, answering patient's questions, and coordinating care.  I,Katherina A Bramblett,acting as a scribe for Blane Ohara, MD.,have documented all relevant documentation on the behalf of Blane Ohara, MD,as directed by  Blane Ohara, MD while in the presence of Blane Ohara, MD.   An After Visit Summary was printed and given to the patient.  I attest that I have reviewed this visit and agree with the plan scribed by my staff.   Blane Ohara, MD [facility] [phone]

## 2022-10-13 NOTE — Assessment & Plan Note (Signed)
Stable.  Recommend continue Entresto 24-26 mg twice a day, Isosorbide 60 mg daily, Spironolactone 25 mg daily, Ranexa 500 mg twice daily, metoprolol XL 25 mg daily.

## 2022-10-13 NOTE — Patient Instructions (Addendum)
Discuss with pharmacy about shingrix and Tdap vaccine.  Gout flare: Take cholchicine one tablet twice a day for 1 week due to your gout flare up.  Diabetes: Increased Ozempic to 1 mg weekly- check blood sugars to make sure your sugars are not low. If so contact our office.  Pain management: Start on butrans patch 10 mcg /hour apply once weekly. May use percocet twice a day as needed for break through pain.

## 2022-10-13 NOTE — Assessment & Plan Note (Addendum)
Well controlled.  No changes to medicines. Continue pepcid 20 mg twice daily. Protonix 40 mg one twice daily.

## 2022-10-13 NOTE — Assessment & Plan Note (Signed)
The current medical regimen is effective;  continue present plan and medications. Continue zoloft 25 mg daily.

## 2022-10-13 NOTE — Assessment & Plan Note (Addendum)
Well controlled. Recommend check sugars fasting daily. Recommend check feet daily. Recommend annual eye exams. Continue to work on eating a healthy diet and exercise.  Labs drawn today.    Increase ozempic to 1 mg daily Continue Lantus 30 units daily, metformin 500 mg daily

## 2022-10-13 NOTE — Assessment & Plan Note (Signed)
Not at goal. Start butrans 10 mcg/hr apply one patch weekly.  Recommend oxycodone/apap 5/325 mg one oral twice daily as needed.  Check UDS

## 2022-10-14 LAB — COMPREHENSIVE METABOLIC PANEL
ALT: 11 IU/L (ref 0–44)
AST: 15 IU/L (ref 0–40)
Albumin/Globulin Ratio: 1.6 (ref 1.2–2.2)
Albumin: 4.2 g/dL (ref 3.9–4.9)
Alkaline Phosphatase: 87 IU/L (ref 44–121)
BUN/Creatinine Ratio: 11 (ref 10–24)
BUN: 12 mg/dL (ref 8–27)
Bilirubin Total: 0.4 mg/dL (ref 0.0–1.2)
CO2: 23 mmol/L (ref 20–29)
Calcium: 10.2 mg/dL (ref 8.6–10.2)
Chloride: 99 mmol/L (ref 96–106)
Creatinine, Ser: 1.1 mg/dL (ref 0.76–1.27)
Globulin, Total: 2.7 g/dL (ref 1.5–4.5)
Glucose: 188 mg/dL — ABNORMAL HIGH (ref 70–99)
Potassium: 5.2 mmol/L (ref 3.5–5.2)
Sodium: 138 mmol/L (ref 134–144)
Total Protein: 6.9 g/dL (ref 6.0–8.5)
eGFR: 75 mL/min/{1.73_m2} (ref >59)

## 2022-10-14 LAB — CBC WITH DIFFERENTIAL/PLATELET
Basophils Absolute: 0 10*3/uL (ref 0.0–0.2)
Basos: 0 %
EOS (ABSOLUTE): 0.2 10*3/uL (ref 0.0–0.4)
Eos: 2 %
Hematocrit: 42.9 % (ref 37.5–51.0)
Hemoglobin: 14.5 g/dL (ref 13.0–17.7)
Immature Grans (Abs): 0.1 10*3/uL (ref 0.0–0.1)
Immature Granulocytes: 1 %
Lymphocytes Absolute: 2.2 10*3/uL (ref 0.7–3.1)
Lymphs: 19 %
MCH: 30.2 pg (ref 26.6–33.0)
MCHC: 33.8 g/dL (ref 31.5–35.7)
MCV: 89 fL (ref 79–97)
Monocytes Absolute: 0.5 10*3/uL (ref 0.1–0.9)
Monocytes: 5 %
Neutrophils Absolute: 8.6 10*3/uL — ABNORMAL HIGH (ref 1.4–7.0)
Neutrophils: 73 %
Platelets: 511 10*3/uL — ABNORMAL HIGH (ref 150–450)
RBC: 4.8 x10E6/uL (ref 4.14–5.80)
RDW: 11.9 % (ref 11.6–15.4)
WBC: 11.5 10*3/uL — ABNORMAL HIGH (ref 3.4–10.8)

## 2022-10-14 LAB — PAIN MGT SCRN (14 DRUGS), UR
Amphetamine Scrn, Ur: NEGATIVE ng/mL
BARBITURATE SCREEN URINE: NEGATIVE ng/mL
BENZODIAZEPINE SCREEN, URINE: NEGATIVE ng/mL
Buprenorphine, Urine: NEGATIVE ng/mL
CANNABINOIDS UR QL SCN: NEGATIVE ng/mL
Cocaine (Metab) Scrn, Ur: NEGATIVE ng/mL
Creatinine(Crt), U: 130.2 mg/dL (ref 20.0–300.0)
Fentanyl, Urine: NEGATIVE pg/mL
Meperidine Screen, Urine: NEGATIVE ng/mL
Methadone Screen, Urine: NEGATIVE ng/mL
OXYCODONE+OXYMORPHONE UR QL SCN: POSITIVE ng/mL — AB
Opiate Scrn, Ur: NEGATIVE ng/mL
Ph of Urine: 5.2 (ref 4.5–8.9)
Phencyclidine Qn, Ur: NEGATIVE ng/mL
Propoxyphene Scrn, Ur: NEGATIVE ng/mL
Tramadol Screen, Urine: NEGATIVE ng/mL

## 2022-10-14 LAB — LIPID PANEL
Chol/HDL Ratio: 3.2 ratio (ref 0.0–5.0)
Cholesterol, Total: 176 mg/dL (ref 100–199)
HDL: 55 mg/dL (ref >39)
LDL Chol Calc (NIH): 99 mg/dL (ref 0–99)
Triglycerides: 127 mg/dL (ref 0–149)
VLDL Cholesterol Cal: 22 mg/dL (ref 5–40)

## 2022-10-14 LAB — HCV AB W REFLEX TO QUANT PCR: HCV Ab: NONREACTIVE

## 2022-10-14 LAB — HEMOGLOBIN A1C
Est. average glucose Bld gHb Est-mCnc: 183 mg/dL
Hgb A1c MFr Bld: 8 % — ABNORMAL HIGH (ref 4.8–5.6)

## 2022-10-14 LAB — HIV ANTIBODY (ROUTINE TESTING W REFLEX): HIV Screen 4th Generation wRfx: NONREACTIVE

## 2022-10-14 LAB — CARDIOVASCULAR RISK ASSESSMENT

## 2022-10-18 NOTE — Progress Notes (Signed)
Blood count abnormal. Wbc decreased. Platelets little up. Recheck cbc in 2 weeks.  Liver function normal.  Kidney function normal.  Cholesterol: very good. Improved. The current medical regimen is effective;   HBA1C: 7.5 worsened to 8.0. I increased ozempic 1 mg weekly. Continue xigduo xr 5/500 mg daily. Continue lantus 30 U daily. If sugars start to drop too low, recommend decrease lantus to 15 U daily UDS positive for oxycodone. Otherwise normal. Consistent with medications. HIV negative.  Hep C negative.

## 2022-10-19 ENCOUNTER — Telehealth: Payer: Self-pay

## 2022-10-19 NOTE — Telephone Encounter (Signed)
Patient left voicemail stating pain patch caused dizziness and itching. He tried medication for one day and then discontinued. Needs alternative.

## 2022-10-20 ENCOUNTER — Other Ambulatory Visit: Payer: Self-pay | Admitting: Family Medicine

## 2022-10-20 ENCOUNTER — Telehealth: Payer: Self-pay

## 2022-10-20 NOTE — Telephone Encounter (Signed)
Patient called and stated that he tried the  buprenorphine (BUTRANS) 10 MCG/HR PTWK for 2 days and he is not able to take the patch, stated it made him dizzy, and made him itchy. He took the patch off and will bring them with him at his appointment he got coming up. He don't want to use them again, stated he will be fine with just taking the oxycodone 4 times a day. Please advise

## 2022-10-20 NOTE — Telephone Encounter (Signed)
Left message for patient to call our office. How was patient taking pain medication when he used the pain patch? Was the itching all over or only where pain patch was applied?

## 2022-10-21 ENCOUNTER — Telehealth: Payer: Self-pay

## 2022-10-21 NOTE — Progress Notes (Signed)
  Chronic Care Management Note  10/21/2022 Name: Brent Ortiz MRN: [id number] DOB: 1961-02-14  Brent Ortiz is a 62 y.o. year old male who is a primary care patient of [name], Kirsten, MD and is actively engaged with the Chronic Care Management team. I reached out to Shane Crutch by phone today to assist with re-scheduling a follow up visit with the RN Case Manager  Follow up plan: Unsuccessful telephone outreach attempt made. A HIPAA compliant phone message was left for the patient providing contact information and requesting a return call.  The care management team will reach out to the patient again over the next 7 days.  If patient returns call to provider office, please advise to call CCM Care Guide Penne Lash  at [phone]  Penne Lash, RMA Care Guide [facility]  [place], [place] [address] Direct Dial: [phone] [email or web address]

## 2022-10-27 ENCOUNTER — Other Ambulatory Visit: Payer: Self-pay

## 2022-10-27 MED ORDER — GABAPENTIN 300 MG PO CAPS: ORAL_CAPSULE | ORAL | 1 refills | Status: DC

## 2022-10-29 ENCOUNTER — Other Ambulatory Visit: Payer: Self-pay | Admitting: Family Medicine

## 2022-10-30 ENCOUNTER — Other Ambulatory Visit: Payer: Self-pay

## 2022-10-30 MED ORDER — OXYCODONE-ACETAMINOPHEN 5-325 MG PO TABS: ORAL_TABLET | ORAL | 0 refills | Status: DC

## 2022-11-03 DIAGNOSIS — R32 Unspecified urinary incontinence: Secondary | ICD-10-CM

## 2022-11-03 HISTORY — DX: Unspecified urinary incontinence: R32

## 2022-11-03 NOTE — Assessment & Plan Note (Addendum)
Recommend rollator walker for ambulation and ADLs.

## 2022-11-03 NOTE — Addendum Note (Signed)
Addended byBlane Ohara on: 11/03/2022 07:26 PM   Modules accepted: Orders

## 2022-11-03 NOTE — Assessment & Plan Note (Signed)
Needs Chux for incontinence management and for hygiene purposes.

## 2022-11-03 NOTE — Addendum Note (Signed)
Addended byBlane Ohara on: 11/03/2022 11:30 PM   Modules accepted: Orders

## 2022-11-04 ENCOUNTER — Telehealth: Payer: Self-pay

## 2022-11-04 NOTE — Telephone Encounter (Signed)
Attempted to reach patient to let him know that the CT Lung screening/Nodule follow-up is scheduled at [facility] May 7 checking in at 2:30pm.  Patient did not answer at either number on file.

## 2022-11-08 ENCOUNTER — Other Ambulatory Visit: Payer: Self-pay | Admitting: Family Medicine

## 2022-11-09 ENCOUNTER — Other Ambulatory Visit: Payer: Self-pay | Admitting: Family Medicine

## 2022-11-10 ENCOUNTER — Ambulatory Visit (INDEPENDENT_AMBULATORY_CARE_PROVIDER_SITE_OTHER): Payer: 59 | Admitting: Family Medicine

## 2022-11-10 VITALS — BP 130/80 | HR 100 | Temp 96.9°F | Resp 20 | Ht 68.0 in | Wt 309.0 lb

## 2022-11-10 DIAGNOSIS — M545 Low back pain, unspecified: Secondary | ICD-10-CM

## 2022-11-10 NOTE — Progress Notes (Signed)
  Chronic Care Management Note  11/10/2022 Name: Brent Ortiz MRN: [id number] DOB: 1960/11/05  Brent Ortiz is a 62 y.o. year old male who is a primary care patient of [name], Kirsten, MD and is actively engaged with the Chronic Care Management team. I reached out to Shane Crutch by phone today to assist with re-scheduling a follow up visit with the RN Case Manager  Follow up plan: Unsuccessful telephone outreach attempt made. A HIPAA compliant phone message was left for the patient providing contact information and requesting a return call.  The care management team will reach out to the patient again over the next 7 days.  If patient returns call to provider office, please advise to call CCM Care Guide Brent Ortiz  at [phone]  Brent Ortiz, RMA Care Guide [facility]  [place], [place] [address] Direct Dial: [phone] [email or web address]

## 2022-11-10 NOTE — Patient Instructions (Signed)
Start celebrex 200 mg daily.  Restart percocet

## 2022-11-10 NOTE — Progress Notes (Signed)
Subjective:  Patient ID: BELEN LALLO, male    DOB: March 04, 1961  Age: 62 y.o. MRN: [id number]  Chief Complaint  Patient presents with   chronic back pain   Pain Management    HPI Mr. Dudzik comes in for follow-up of his buprenorphine patch.  He tried 1 patch but developed severe itching and dizziness.  He stopped the medication and went back to oxycodone 5/325 which he had left over at home.  He used what he had on hand. On oxycodone pain is a 7/10.     11/10/2022   10:57 AM 10/13/2022    9:49 AM 07/24/2022    1:39 PM 07/09/2022    9:20 AM 06/24/2022    2:16 PM  Depression screen PHQ 2/9  Decreased Interest 1 0 0 0 0  Down, Depressed, Hopeless 1 0 0 0 0  PHQ - 2 Score 2 0 0 0 0  Altered sleeping 1  0 0 2  Tired, decreased energy 1  0 0 2  Change in appetite 2  0 0 0  Feeling bad or failure about yourself  2  0 0 0  Trouble concentrating 1  0 0 0  Moving slowly or fidgety/restless 1  0 0 0  Suicidal thoughts 0  0 0 0  PHQ-9 Score 10  0 0 4  Difficult doing work/chores Somewhat difficult  Not difficult at all Not difficult at all Not difficult at all        11/10/2022   10:57 AM  Fall Risk   Falls in the past year? 1  Number falls in past yr: 1  Injury with Fall? 1  Risk for fall due to : Impaired mobility;Impaired balance/gait  Follow up Falls evaluation completed;Falls prevention discussed    Patient Care Team: Blane Ohara, MD as PCP - General (Family Medicine) Zettie Pho, [facility] as Pharmacist (Pharmacist) [name], Aundra Dubin, MD as Consulting Physician (Cardiology) Freddie Breech, DPM as Consulting Physician (Podiatry) Zettie Pho, [facility] (Pharmacist) Marlowe Sax, RN as Case Manager (General Practice)   Review of Systems  Constitutional:  Positive for fatigue. Negative for chills and fever.  HENT:  Negative for congestion, rhinorrhea and sore throat.   Respiratory:  Negative for cough and shortness of breath.   Cardiovascular:  Negative for chest  pain and palpitations.  Gastrointestinal:  Negative for abdominal pain, constipation, diarrhea, nausea and vomiting.  Genitourinary:  Negative for dysuria and urgency.  Musculoskeletal:  Positive for back pain. Negative for arthralgias (left foot and left ankle) and myalgias.  Neurological:  Negative for dizziness and headaches.  Psychiatric/Behavioral:  Negative for dysphoric mood. The patient is not nervous/anxious.     Current Outpatient Medications on File Prior to Visit  Medication Sig Dispense Refill   Accu-Chek Softclix Lancets lancets Use as instructed 100 each 5   albuterol (VENTOLIN HFA) 108 (90 Base) MCG/ACT inhaler Inhale 2 puffs into the lungs every 4 (four) hours as needed for wheezing or shortness of breath. 25.5 g 3   allopurinol (ZYLOPRIM) 300 MG tablet Take 1 tablet (300 mg total) by mouth daily. 90 tablet 3   amLODipine (NORVASC) 5 MG tablet Take 1 tablet (5 mg total) by mouth daily. 90 tablet 1   ammonium lactate (LAC-HYDRIN) 12 % lotion Apply 1 Application topically as needed for dry skin. 400 g 5   aspirin EC (GOODSENSE ASPIRIN LOW DOSE) 81 MG tablet TAKE 1 TABLET (81MG ) BY MOUTH DAILY. SWALLOW WHOLE.(YELLOW ROUND TAB  WITH A HEART) 90 tablet 1   Blood Glucose Monitoring Suppl (ACCU-CHEK GUIDE) w/Device KIT 1 each by Does not apply route in the morning, at noon, and at bedtime. 1 kit 0   clopidogrel (PLAVIX) 75 MG tablet TAKE 1 TABLET BY MOUTH ONCE DAILY (ROUND PINK TABLET WITH E 34) 90 tablet 1   colchicine 0.6 MG tablet TAKE 1 TABLET(0.6 MG) BY MOUTH TWICE DAILY FOR 7 DAYS THEN TAKE 1 TABLET(0.6 MG) BY MOUTH DAILY FOR 23 DAYS 180 tablet 1   ezetimibe (ZETIA) 10 MG tablet Take 10 mg by mouth daily. (Patient not taking: Reported on 08/14/2022)     gabapentin (NEURONTIN) 300 MG capsule TAKE 1 CAPSULE (300MG ) BY MOUTH TWICE DAILY.(YELLOW CAP) 180 capsule 1   Glucagon, rDNA, (GLUCAGON EMERGENCY) 1 MG KIT Inject 1 mg into the muscle as needed (BG less than 70).     glucose blood  (ACCU-CHEK GUIDE) test strip Use as instructed 100 each 5   insulin glargine (LANTUS SOLOSTAR) 100 UNIT/ML Solostar Pen Take 30 units daily. 15 mL 3   isosorbide mononitrate (IMDUR) 60 MG 24 hr tablet Take 1 tablet (60 mg total) by mouth daily. 90 tablet 1   metoprolol succinate (TOPROL-XL) 25 MG 24 hr tablet Take 1 tablet (25 mg total) by mouth daily. 90 tablet 1   nitroGLYCERIN (NITROSTAT) 0.4 MG SL tablet DISSOLVE 1 TABLET UNDER TONGUE EVERY 5 MINUTES AS NEEDED CHEST PAIN. 25 tablet 1   pantoprazole (PROTONIX) 40 MG tablet Take 1 tablet (40 mg total) by mouth daily. 90 tablet 1   polyethylene glycol (MIRALAX / GLYCOLAX) 17 g packet Take 17 g by mouth daily.     ranolazine (RANEXA) 500 MG 12 hr tablet TAKE ONE TABLET BY MOUTH TWICE DAILY (blue oblong tablet with H R18, or PINK OBLONG TABLET WITH RL49)) 186 tablet 0   rosuvastatin (CRESTOR) 40 MG tablet Take 1 tablet (40 mg total) by mouth daily. 90 tablet 1   sacubitril-valsartan (ENTRESTO) 24-26 MG TAKE 1 TABLET BY MOUTH 2 TIMES DAILY.(OBLONG PINK TAB WITH LZ NVR) 186 tablet 0   Semaglutide, 1 MG/DOSE, 4 MG/3ML SOPN Inject 1 mg as directed once a week. 3 mL 0   sertraline (ZOLOFT) 25 MG tablet TAKE 1 TABLET (25MG ) BY MOUTH DAILY.(OBLONG GREEN TABLET WITH A 16) 90 tablet 1   spironolactone (ALDACTONE) 25 MG tablet TAKE 1/2 TABLET(12.5) BY MOUTH DAILY. 135 tablet 1   tamsulosin (FLOMAX) 0.4 MG CAPS capsule TAKE 1 CAPSULE (.4MG  TOTAL) BY MOUTH DAILY.(GREEN AND GOLD CAPSULE WITH W 516) 90 capsule 1   traZODone (DESYREL) 150 MG tablet TAKE 1 TABLET(150MG ) BY MOUTH AT BEDTIME (OVAL WHITE TABLET WITH 8 07, or 13 32 & 50 50 50) 90 tablet 1   TRELEGY ELLIPTA 100-62.5-25 MCG/ACT AEPB INHALE 1 PUFF ONCE DAILY 60 each 2   ULTICARE SHORT PEN NEEDLES 31G X 8 MM MISC USE AS DIRECTED (ONCE DAILY WITH LANTUS INJECTION AS INSTRUCTED) 100 each 4   XIGDUO XR 5-500 MG TB24 TAKE 1 TABLET BY MOUTH DAILY 90 tablet 1   No current facility-administered medications on  file prior to visit.   Past Medical History:  Diagnosis Date   Acute combined systolic and diastolic CHF, NYHA class 2 (HCC) 01/22/2020   Acute gout of left elbow 02/14/2022   Acute idiopathic gout of right wrist 12/29/2021   Alcohol abuse    Atherosclerosis of coronary artery of native heart with stable angina pectoris, unspecified vessel or lesion type (HCC)  01/23/2016   Overview:  Completely occluded circumflex artery proximal to obtuse marginal 1 basin cardiac catheter from 2017   Atherosclerosis of native coronary artery of native heart without angina pectoris 01/23/2016   Overview:  Completely occluded circumflex artery proximal to obtuse marginal 1 basin cardiac catheter from 2017   Bilateral hip pain 11/17/2021   BMI 45.0-49.9, adult (HCC) 01/13/2013   Cellulitis of right upper extremity 01/01/2022   Centrilobular emphysema (HCC) 01/16/2022   Chronic kidney disease    Chronic pain 10/19/2019   Sees pain clinic   COPD (chronic obstructive pulmonary disease) (HCC)    COPD exacerbation (HCC) 10/03/2019   Coronary artery disease involving native heart 02/08/2020   Coronary atherosclerosis of native coronary artery 01/23/2016   Overview:  Completely occluded circumflex artery proximal to obtuse marginal 1 basin cardiac catheter from 2017   Depression, major, single episode, moderate (HCC) 07/11/2020   Diabetic polyneuropathy (HCC) 12/06/2019   ED (erectile dysfunction) 12/06/2019   Essential hypertension 01/23/2016   Essential thrombocythemia (HCC) 09/29/2011   GERD (gastroesophageal reflux disease) 11/17/2021   GERD without esophagitis    HFrEF (heart failure with reduced ejection fraction) (HCC) 02/08/2020   Knee pain, left 02/12/2012   Formatting of this note might be different from the original. Left   Leukocytosis 09/04/2011   Lumbar pain 11/17/2021   Mild major depression, single episode (HCC) 02/14/2022   Mixed hyperlipidemia 06/18/2017   Mixed incontinence 02/24/2022   Morbid  obesity (HCC) 11/21/2020   Morbid obesity with BMI of 40.0-44.9, adult (HCC) 01/13/2013   Myocardial infarction (HCC)    04-13-2011   Obstructive chronic bronchitis with exacerbation (HCC) 10/03/2019   Obstructive sleep apnea 07/18/2011   Olecranon bursitis of left elbow 02/14/2022   Olecranon bursitis of left elbow 02/14/2022   Opiate abuse, continuous (HCC)    Osteoarthritis    Pain in knee joint 02/12/2012   Formatting of this note might be different from the original. Left   Personal history of stroke with residual effects 07/24/2021   Pneumonia    Polysubstance abuse (HCC)    Poor dentition    Stage 3b chronic kidney disease (HCC) 12/27/2020   Thrombocytosis 09/29/2011   Tobacco dependence 01/23/2016   Tobacco use disorder 01/23/2016   Type 2 diabetes mellitus with diabetic polyneuropathy [facility])    Past Surgical History:  Procedure Laterality Date   HERNIA REPAIR  2009   I & D KNEE WITH POLY EXCHANGE  09/03/2011   Procedure: IRRIGATION AND DEBRIDEMENT KNEE WITH POLY EXCHANGE;  Surgeon: Shelda Pal, MD;  Location: [facility] ORS;  Service: Orthopedics;  Laterality: Left;   TOOTH EXTRACTION Bilateral 08/06/2017   Procedure: DENTAL RESTORATION/EXTRACTIONS;  Surgeon: Ocie Doyne, DDS;  Location: [facility] OR;  Service: Oral Surgery;  Laterality: Bilateral;   TOTAL KNEE ARTHROPLASTY   right  feb 2012   left march 2012 r   TOTAL KNEE REVISION  06/23/2011   Procedure: TOTAL KNEE REVISION;  Surgeon: Shelda Pal;  Location: [facility] ORS;  Service: Orthopedics;  Laterality: Left;  femomal nerve block done in holding area without incident    Family History  Problem Relation Age of Onset   Colon cancer Other    Colon cancer Mother    Social History   Socioeconomic History   Marital status: Widowed    Spouse name: Not on file   Number of children: 5   Years of education: Not on file   Highest education level: Not on file  Occupational History  Occupation: Disabled  Tobacco Use   Smoking status:  Every Day    Packs/day: 1.00    Years: 50.00    Additional pack years: 0.00    Total pack years: 50.00    Types: Cigarettes   Smokeless tobacco: Never  Vaping Use   Vaping Use: Former  Substance and Sexual Activity   Alcohol use: Not on file    Comment: heavy drinker in past. occasionally has a beer. Not on regular basis.   Drug use: Not Currently   Sexual activity: Not Currently  Other Topics Concern   Not on file  Social History Narrative   Not on file   Social Determinants of Health   Financial Resource Strain: Low Risk  (08/14/2022)   Overall Financial Resource Strain (CARDIA)    Difficulty of Paying Living Expenses: Not hard at all  Food Insecurity: No Food Insecurity (08/03/2022)   Hunger Vital Sign    Worried About Running Out of Food in the Last Year: Never true    Ran Out of Food in the Last Year: Never true  Transportation Needs: No Transportation Needs (08/14/2022)   PRAPARE - Administrator, Civil Service (Medical): No    Lack of Transportation (Non-Medical): No  Physical Activity: Inactive (07/24/2022)   Exercise Vital Sign    Days of Exercise per Week: 0 days    Minutes of Exercise per Session: 0 min  Stress: No Stress Concern Present (08/03/2022)   [employer] of Occupational Health - Occupational Stress Questionnaire    Feeling of Stress : Not at all  Social Connections: Socially Isolated (08/03/2022)   Social Connection and Isolation Panel [NHANES]    Frequency of Communication with Friends and Family: More than three times a week    Frequency of Social Gatherings with Friends and Family: More than three times a week    Attends Religious Services: Never    Database administrator or Organizations: No    Attends Banker Meetings: Never    Marital Status: Widowed    Objective:  BP 130/80   Pulse 100   Temp (!) 96.9 F (36.1 C)   Resp 20   Ht 5\' 8"  (1.727 m)   Wt (!) 309 lb (140.2 kg)   BMI 46.98 kg/m      11/10/2022    10:52 AM 10/13/2022    9:47 AM 07/09/2022    9:26 AM  BP/Weight  Systolic BP 130 112 116  Diastolic BP 80 70 78  Wt. (Lbs) 309 307   BMI 46.98 kg/m2 46.68 kg/m2     Physical Exam Vitals reviewed.  Constitutional:      Appearance: Normal appearance. He is obese.  Neck:     Vascular: No carotid bruit.  Cardiovascular:     Rate and Rhythm: Normal rate and regular rhythm.     Heart sounds: Normal heart sounds.  Pulmonary:     Effort: Pulmonary effort is normal.     Breath sounds: Normal breath sounds. No wheezing, rhonchi or rales.  Abdominal:     General: Bowel sounds are normal.     Palpations: Abdomen is soft.     Tenderness: There is no abdominal tenderness.  Musculoskeletal:        General: Tenderness (lumbar) present.  Neurological:     Mental Status: He is alert and oriented to person, place, and time.  Psychiatric:        Mood and Affect: Mood normal.  Behavior: Behavior normal.     Diabetic Foot Exam - Simple   No data filed      Lab Results  Component Value Date   WBC 11.5 (H) 10/13/2022   HGB 14.5 10/13/2022   HCT 42.9 10/13/2022   PLT 511 (H) 10/13/2022   GLUCOSE 188 (H) 10/13/2022   CHOL 176 10/13/2022   TRIG 127 10/13/2022   HDL 55 10/13/2022   LDLCALC 99 10/13/2022   ALT 11 10/13/2022   AST 15 10/13/2022   NA 138 10/13/2022   K 5.2 10/13/2022   CL 99 10/13/2022   CREATININE 1.10 10/13/2022   BUN 12 10/13/2022   CO2 23 10/13/2022   TSH 1.140 07/09/2022   INR 1.81 (H) 09/07/2011   HGBA1C 8.0 (H) 10/13/2022   MICROALBUR 80 09/27/2020      Assessment & Plan:    Lumbar pain Assessment & Plan: Start on celebrex 200 mg daily.  Continue oxycodone 5/325 mg four times a day.   Orders: -     Celecoxib; Take 1 capsule (200 mg total) by mouth daily.  Dispense: 90 capsule; Refill: 1 -     oxyCODONE-Acetaminophen; TAKE ONE TABLET BY MOUTH IN THE MORNING, AT NOON, IN THE EVENING, AND AT BEDTIME  Dispense: 120 tablet; Refill: 0     Meds  ordered this encounter  Medications   celecoxib (CELEBREX) 200 MG capsule    Sig: Take 1 capsule (200 mg total) by mouth daily.    Dispense:  90 capsule    Refill:  1   oxyCODONE-acetaminophen (PERCOCET/ROXICET) 5-325 MG tablet    Sig: TAKE ONE TABLET BY MOUTH IN THE MORNING, AT NOON, IN THE EVENING, AND AT BEDTIME    Dispense:  120 tablet    Refill:  0    No orders of the defined types were placed in this encounter.    Follow-up: No follow-ups on file.   I,Carolyn M Morrison,acting as a Neurosurgeon for Blane Ohara, MD.,have documented all relevant documentation on the behalf of Blane Ohara, MD,as directed by  Blane Ohara, MD while in the presence of Blane Ohara, MD.   An After Visit Summary was printed and given to the patient.  Blane Ohara, MD [facility] [phone]

## 2022-11-15 ENCOUNTER — Encounter: Payer: Self-pay | Admitting: Family Medicine

## 2022-11-15 MED ORDER — OXYCODONE-ACETAMINOPHEN 5-325 MG PO TABS
ORAL_TABLET | ORAL | 0 refills | Status: DC
Start: 2022-11-15 — End: 2022-12-08

## 2022-11-15 MED ORDER — CELECOXIB 200 MG PO CAPS
200.0000 mg | ORAL_CAPSULE | Freq: Every day | ORAL | 1 refills | Status: AC
Start: 2022-11-15 — End: ?

## 2022-11-15 NOTE — Assessment & Plan Note (Signed)
Start on celebrex 200 mg daily.  Continue oxycodone 5/325 mg four times a day.

## 2022-11-16 NOTE — Progress Notes (Signed)
  Chronic Care Management Note  11/16/2022 Name: Brent Ortiz MRN: [id number] DOB: [birth year]  Brent Ortiz is a 63 y.o. year old male who is a primary care patient of [name], Kirsten, MD and is actively engaged with the Chronic Care Management team. I reached out to Brent Ortiz by phone today to assist with re-scheduling a follow up visit with the RN Case Manager  Follow up plan: Unable to make contact on outreach attempts x 3. PCP [name], Kirsten, MD notified via routed documentation in medical record.   Penne Lash, RMA Care Guide [facility]  [place], [place] [address] Direct Dial: [phone] [email or web address]

## 2022-11-20 ENCOUNTER — Other Ambulatory Visit: Payer: Self-pay | Admitting: Family Medicine

## 2022-11-23 ENCOUNTER — Other Ambulatory Visit: Payer: Self-pay | Admitting: Family Medicine

## 2022-11-23 DIAGNOSIS — E1142 Type 2 diabetes mellitus with diabetic polyneuropathy: Secondary | ICD-10-CM

## 2022-11-23 MED ORDER — SEMAGLUTIDE (1 MG/DOSE) 4 MG/3ML ~~LOC~~ SOPN: 1.0000 mg | PEN_INJECTOR | SUBCUTANEOUS | 0 refills | Status: DC

## 2022-11-24 ENCOUNTER — Telehealth: Payer: Self-pay | Admitting: Family Medicine

## 2022-11-24 NOTE — Telephone Encounter (Signed)
[facility] DME ROLLATOR ORDER

## 2022-11-26 ENCOUNTER — Telehealth: Payer: Self-pay

## 2022-11-26 NOTE — Telephone Encounter (Signed)
Sent. Dr. Makell Drohan  

## 2022-11-26 NOTE — Telephone Encounter (Signed)
Brent Ortiz from [employer] called regarding notes that were sent with patient order for a Rollator walker. She states that the note has to be addended and stating specifically that the patient needs the Rollator Walker and what he needs it for. If you can addend this and send back to me, I will print out an re-fax the note again.    Fax# [phone]

## 2022-11-29 ENCOUNTER — Other Ambulatory Visit: Payer: Self-pay | Admitting: Family Medicine

## 2022-12-04 ENCOUNTER — Telehealth: Payer: Self-pay | Admitting: Family Medicine

## 2022-12-04 NOTE — Telephone Encounter (Signed)
[facility] manager FORM MIXED INCONTINENCE

## 2022-12-07 ENCOUNTER — Ambulatory Visit: Payer: 59 | Admitting: Podiatry

## 2022-12-08 ENCOUNTER — Other Ambulatory Visit: Payer: Self-pay | Admitting: Family Medicine

## 2022-12-08 DIAGNOSIS — M545 Low back pain, unspecified: Secondary | ICD-10-CM

## 2022-12-10 ENCOUNTER — Other Ambulatory Visit: Payer: Self-pay | Admitting: Family Medicine

## 2022-12-15 ENCOUNTER — Ambulatory Visit: Payer: 59 | Admitting: Podiatry

## 2022-12-17 ENCOUNTER — Ambulatory Visit: Payer: Medicare Other | Admitting: Podiatry

## 2022-12-28 ENCOUNTER — Telehealth: Payer: Self-pay

## 2022-12-28 NOTE — Telephone Encounter (Signed)
Brent Ortiz called requesting that his medication be changed from ozempic to rybelsus.  His sister is taking the rybelsus and having good results.  He is tolerating the ozempic but does not think that it has been helpful in weight loss.

## 2022-12-29 NOTE — Telephone Encounter (Signed)
Tried to call patient and the message said that the patient's number is not in service.

## 2023-01-01 ENCOUNTER — Other Ambulatory Visit: Payer: Self-pay | Admitting: Family Medicine

## 2023-01-01 DIAGNOSIS — M545 Low back pain, unspecified: Secondary | ICD-10-CM

## 2023-01-12 ENCOUNTER — Telehealth: Payer: Self-pay

## 2023-01-12 NOTE — Telephone Encounter (Signed)
[facility]IPMENT PRESCRIPTION-BARIATRIC SHOWER CHAIR WITH BACK

## 2023-01-25 ENCOUNTER — Other Ambulatory Visit: Payer: Self-pay | Admitting: Family Medicine

## 2023-02-01 ENCOUNTER — Other Ambulatory Visit: Payer: Self-pay | Admitting: Family Medicine

## 2023-02-01 ENCOUNTER — Ambulatory Visit (INDEPENDENT_AMBULATORY_CARE_PROVIDER_SITE_OTHER): Payer: 59 | Admitting: Family Medicine

## 2023-02-01 ENCOUNTER — Encounter: Payer: Self-pay | Admitting: Family Medicine

## 2023-02-01 VITALS — BP 130/78 | HR 96 | Temp 97.6°F | Ht 68.0 in | Wt 303.0 lb

## 2023-02-01 DIAGNOSIS — M545 Low back pain, unspecified: Secondary | ICD-10-CM

## 2023-02-01 DIAGNOSIS — G894 Chronic pain syndrome: Secondary | ICD-10-CM

## 2023-02-01 DIAGNOSIS — K219 Gastro-esophageal reflux disease without esophagitis: Secondary | ICD-10-CM | POA: Diagnosis not present

## 2023-02-01 DIAGNOSIS — E782 Mixed hyperlipidemia: Secondary | ICD-10-CM

## 2023-02-01 DIAGNOSIS — Z6841 Body Mass Index (BMI) 40.0 and over, adult: Secondary | ICD-10-CM

## 2023-02-01 DIAGNOSIS — I10 Essential (primary) hypertension: Secondary | ICD-10-CM

## 2023-02-01 DIAGNOSIS — E1142 Type 2 diabetes mellitus with diabetic polyneuropathy: Secondary | ICD-10-CM

## 2023-02-01 DIAGNOSIS — N39490 Overflow incontinence: Secondary | ICD-10-CM

## 2023-02-01 MED ORDER — OZEMPIC (0.25 OR 0.5 MG/DOSE) 2 MG/3ML ~~LOC~~ SOPN
0.2500 mg | PEN_INJECTOR | SUBCUTANEOUS | 0 refills | Status: DC
Start: 2023-02-01 — End: 2023-02-09

## 2023-02-01 NOTE — Assessment & Plan Note (Signed)
Well controlled.  No medication changes recommended. Continue Spironolactone 25 mg  daily, metoprolol 25 mg twice a day, amlodipine 5 mg daily..   Continue healthy diet and exercise.

## 2023-02-01 NOTE — Assessment & Plan Note (Signed)
Recommend continue to work on eating healthy diet and exercise.  

## 2023-02-01 NOTE — Assessment & Plan Note (Addendum)
Well controlled. Recommend check sugars fasting daily. Recommend check feet daily. Recommend annual eye exams. Continue to work on eating a healthy diet and exercise.  Labs drawn today.     Continue Lantus 30 units daily, metformin 500 mg daily, xigduo  Sent rx for CGM-Dexcom Patients phone is not compatible with dexcom app. Rxs being sent. Patient to call our office if he has not received rx's.

## 2023-02-01 NOTE — Assessment & Plan Note (Signed)
Not at goal. Start butrans 10 mcg/hr apply one patch weekly.  Recommend oxycodone/apap 5/325 mg one oral twice daily as needed.

## 2023-02-01 NOTE — Assessment & Plan Note (Signed)
Well controlled.  No changes to medicines. Continue pepcid 20 mg twice daily. Protonix 40 mg one twice daily.

## 2023-02-01 NOTE — Assessment & Plan Note (Signed)
Well controlled.  No medication changes recommended. Continue rosuvastatin 40 mg before bed and zetia 10 mg before bed.  Continue healthy diet and exercise.   Check labs

## 2023-02-01 NOTE — Assessment & Plan Note (Signed)
Continue celebrex 200 mg daily.  Continue oxycodone 5/325 mg four times a day.

## 2023-02-01 NOTE — Progress Notes (Unsigned)
Subjective:  Patient ID: Brent Ortiz, male    DOB: 10/31/1960  Age: 62 y.o. MRN: [id number]  Chief Complaint  Patient presents with   Medical Management of Chronic Issues    HPI DMII: Complicated by hyperlipidemia, hypertension, polyneuropathy.   Checks sugars 2-3 times per week.  Sugars:130-143 Current medicines: Lantus 30 units daily, metformin 500 mg daily, wants to restart ozempic, xigduo 5-500 mg daily Checks feet most days. Has podiatry appt this Wednesday plans to ask about diabetic shoes. Over due for eye doctor. Needs to schedule appt. Last A1C was 8.0   Hyperlipidemia: Rosuvastatin 40 mg daily, Zetia 10 mg daily.   Hypertension: On Spironolactone 25 mg daily, metoprolol 25 mg twice a day, amlodipine 5 mg daily. Patient is working on [employer]. Unable to exercise.     CONGESTIVE HEART FAILURE/CAD: He takes Entresto 24-26 mg twice a day, Isosorbide 60 mg daily, Spironolactone 25 mg daily, Ranexa 500 mg twice daily, metoprolol XL 25 mg daily. Cardiologist   Gout:  Allopurinol 100 mg daily. Colchicine as needed.    Constipation: on miralax 17 mg daily helps.    GERD: pepcid 20 mg twice daily. Protonix 40 mg one twice daily. Has not tried lower dose of protonix.    Insomnia: on trazodone 150 mg before bed. Works Firefighter.    Depression: on zoloft 25 mg daily.    Chronic Back Pain: Oxycodone 5-325 mg QID, pain scale 6/10. Needs rollator walker to assist with ambulation and ADLs, currently uses a cane. Waiting for rollator to be delivered. States he spoke to [place] with [facility] who was calling to get a update on the rollator.  Bladder incontinence: needs chux for incontinence management and hygiene.  Declined lung screening.       02/01/2023   10:33 AM 11/10/2022   10:57 AM 10/13/2022    9:49 AM 07/24/2022    1:39 PM 07/09/2022    9:20 AM  Depression screen PHQ 2/9  Decreased Interest 3 1 0 0 0  Down, Depressed, Hopeless 1 1 0 0 0  PHQ - 2 Score 4 2 0 0 0  Altered  sleeping 0 1  0 0  Tired, decreased energy 2 1  0 0  Change in appetite 2 2  0 0  Feeling bad or failure about yourself  2 2  0 0  Trouble concentrating 1 1  0 0  Moving slowly or fidgety/restless 1 1  0 0  Suicidal thoughts 0 0  0 0  PHQ-9 Score 12 10  0 0  Difficult doing work/chores Somewhat difficult Somewhat difficult  Not difficult at all Not difficult at all        02/01/2023   10:14 AM  Fall Risk   Falls in the past year? 1  Number falls in past yr: 1  Injury with Fall? 1  Risk for fall due to : Impaired balance/gait  Follow up Falls evaluation completed    Patient Care Team: Blane Ohara, MD as PCP - General (Family Medicine) Zettie Pho, [facility] (Inactive) as Pharmacist (Pharmacist) [name], Aundra Dubin, MD as Consulting Physician (Cardiology) Freddie Breech, DPM as Consulting Physician (Podiatry) Zettie Pho, [facility] (Inactive) (Pharmacist) Marlowe Sax, RN as Case Manager (General Practice)   Review of Systems  Constitutional:  Negative for chills, diaphoresis, fatigue and fever.  HENT:  Negative for congestion, ear pain and sore throat.   Respiratory:  Negative for cough and shortness of breath.   Cardiovascular:  Negative for chest pain and leg swelling.  Gastrointestinal:  Negative for abdominal pain, constipation, diarrhea, nausea and vomiting.  Genitourinary:  Negative for dysuria and urgency.  Musculoskeletal:  Positive for arthralgias and back pain. Negative for myalgias.  Neurological:  Negative for dizziness and headaches.  Psychiatric/Behavioral:  Negative for dysphoric mood.     Current Outpatient Medications on File Prior to Visit  Medication Sig Dispense Refill   Accu-Chek Softclix Lancets lancets Use as instructed 100 each 5   albuterol (VENTOLIN HFA) 108 (90 Base) MCG/ACT inhaler Inhale 2 puffs into the lungs every 4 (four) hours as needed for wheezing or shortness of breath. 25.5 g 3   allopurinol (ZYLOPRIM) 300 MG tablet Take 1 tablet  (300 mg total) by mouth daily. 90 tablet 3   amLODipine (NORVASC) 5 MG tablet Take 1 tablet (5 mg total) by mouth daily. 90 tablet 1   ammonium lactate (LAC-HYDRIN) 12 % lotion Apply 1 Application topically as needed for dry skin. 400 g 5   aspirin EC (GOODSENSE ASPIRIN LOW DOSE) 81 MG tablet TAKE 1 TABLET (81MG ) BY MOUTH DAILY. SWALLOW WHOLE.(YELLOW ROUND TAB WITH A HEART) 90 tablet 1   Blood Glucose Monitoring Suppl (ACCU-CHEK GUIDE) w/Device KIT 1 each by Does not apply route in the morning, at noon, and at bedtime. 1 kit 0   celecoxib (CELEBREX) 200 MG capsule Take 1 capsule (200 mg total) by mouth daily. 90 capsule 1   clopidogrel (PLAVIX) 75 MG tablet TAKE 1 TABLET BY MOUTH ONCE DAILY (ROUND PINK TABLET WITH E 34) 90 tablet 1   colchicine 0.6 MG tablet TAKE 1 TABLET(0.6 MG) BY MOUTH TWICE DAILY FOR 7 DAYS THEN TAKE 1 TABLET(0.6 MG) BY MOUTH DAILY FOR 23 DAYS 180 tablet 1   ezetimibe (ZETIA) 10 MG tablet Take 10 mg by mouth daily. (Patient not taking: Reported on 08/14/2022)     gabapentin (NEURONTIN) 300 MG capsule TAKE 1 CAPSULE (300MG ) BY MOUTH TWICE DAILY.(YELLOW CAP) 180 capsule 1   Glucagon, rDNA, (GLUCAGON EMERGENCY) 1 MG KIT Inject 1 mg into the muscle as needed (BG less than 70).     glucose blood (ACCU-CHEK GUIDE) test strip Use as instructed 100 each 5   insulin glargine (LANTUS SOLOSTAR) 100 UNIT/ML Solostar Pen ADMINISTER 30 UNITS UNDER THE SKIN DAILY 15 mL 3   isosorbide mononitrate (IMDUR) 60 MG 24 hr tablet Take 1 tablet (60 mg total) by mouth daily. 90 tablet 1   metoprolol succinate (TOPROL-XL) 25 MG 24 hr tablet TAKE ONE TABLET BY MOUTH EVERY DAY (WHITE OVAL TABLET WITH 564) 31 tablet 3   nicotine (NICODERM CQ - DOSED IN MG/24 HR) 7 mg/24hr patch APPLY 1 PATCH ONTO THE SKIN ONCE DAILY ** DISCARD PROPERLY 30 patch 3   nitroGLYCERIN (NITROSTAT) 0.4 MG SL tablet DISSOLVE 1 TABLET UNDER TONGUE EVERY 5 MINUTES AS NEEDED CHEST PAIN. 25 tablet 1   pantoprazole (PROTONIX) 40 MG tablet  Take 1 tablet (40 mg total) by mouth daily. 90 tablet 1   polyethylene glycol (MIRALAX / GLYCOLAX) 17 g packet Take 17 g by mouth daily.     ranolazine (RANEXA) 500 MG 12 hr tablet TAKE ONE TABLET BY MOUTH TWICE DAILY (blue oblong tablet with H R18, or PINK OBLONG TABLET WITH RL49)) 186 tablet 0   rosuvastatin (CRESTOR) 40 MG tablet Take 1 tablet (40 mg total) by mouth daily. 90 tablet 1   sacubitril-valsartan (ENTRESTO) 24-26 MG TAKE 1 TABLET BY MOUTH 2 TIMES DAILY.(OBLONG PINK TAB  WITH LZ NVR) 186 tablet 0   sertraline (ZOLOFT) 25 MG tablet TAKE 1 TABLET (25MG ) BY MOUTH DAILY.(OBLONG GREEN TABLET WITH A 16) 90 tablet 1   spironolactone (ALDACTONE) 25 MG tablet TAKE 1/2 TABLET(12.5) BY MOUTH DAILY. 135 tablet 1   tamsulosin (FLOMAX) 0.4 MG CAPS capsule TAKE 1 CAPSULE (.4MG  TOTAL) BY MOUTH DAILY.(GREEN AND GOLD CAPSULE WITH W 516) 90 capsule 1   traZODone (DESYREL) 150 MG tablet TAKE 1 TABLET(150MG ) BY MOUTH AT BEDTIME (OVAL WHITE TABLET WITH 8 07, or 13 32 & 50 50 50) 90 tablet 1   TRELEGY ELLIPTA 100-62.5-25 MCG/ACT AEPB INHALE 1 PUFF ONCE DAILY 60 each 2   ULTICARE SHORT PEN NEEDLES 31G X 8 MM MISC USE AS DIRECTED (ONCE DAILY WITH LANTUS INJECTION AS INSTRUCTED) 100 each 4   XIGDUO XR 5-500 MG TB24 TAKE 1 TABLET BY MOUTH DAILY 90 tablet 1   No current facility-administered medications on file prior to visit.   Past Medical History:  Diagnosis Date   Acute combined systolic and diastolic CHF, NYHA class 2 (HCC) 01/22/2020   Acute gout of left elbow 02/14/2022   Acute idiopathic gout of right wrist 12/29/2021   Alcohol abuse    Atherosclerosis of coronary artery of native heart with stable angina pectoris, unspecified vessel or lesion type (HCC) 01/23/2016   Overview:  Completely occluded circumflex artery proximal to obtuse marginal 1 basin cardiac catheter from 2017   Atherosclerosis of native coronary artery of native heart without angina pectoris 01/23/2016   Overview:  Completely occluded  circumflex artery proximal to obtuse marginal 1 basin cardiac catheter from 2017   Bilateral hip pain 11/17/2021   BMI 45.0-49.9, adult (HCC) 01/13/2013   Cellulitis of right upper extremity 01/01/2022   Centrilobular emphysema (HCC) 01/16/2022   Chronic kidney disease    Chronic pain 10/19/2019   Sees pain clinic   COPD (chronic obstructive pulmonary disease) (HCC)    COPD exacerbation (HCC) 10/03/2019   Coronary artery disease involving native heart 02/08/2020   Coronary atherosclerosis of native coronary artery 01/23/2016   Overview:  Completely occluded circumflex artery proximal to obtuse marginal 1 basin cardiac catheter from 2017   Depression, major, single episode, moderate (HCC) 07/11/2020   Diabetic polyneuropathy (HCC) 12/06/2019   ED (erectile dysfunction) 12/06/2019   Essential hypertension 01/23/2016   Essential thrombocythemia (HCC) 09/29/2011   GERD (gastroesophageal reflux disease) 11/17/2021   GERD without esophagitis    HFrEF (heart failure with reduced ejection fraction) (HCC) 02/08/2020   Knee pain, left 02/12/2012   Formatting of this note might be different from the original. Left   Leukocytosis 09/04/2011   Lumbar pain 11/17/2021   Mild major depression, single episode (HCC) 02/14/2022   Mixed hyperlipidemia 06/18/2017   Mixed incontinence 02/24/2022   Morbid obesity (HCC) 11/21/2020   Morbid obesity with BMI of 40.0-44.9, adult (HCC) 01/13/2013   Myocardial infarction (HCC)    04-13-2011   Obstructive chronic bronchitis with exacerbation (HCC) 10/03/2019   Obstructive sleep apnea 07/18/2011   Olecranon bursitis of left elbow 02/14/2022   Olecranon bursitis of left elbow 02/14/2022   Opiate abuse, continuous (HCC)    Osteoarthritis    Pain in knee joint 02/12/2012   Formatting of this note might be different from the original. Left   Personal history of stroke with residual effects 07/24/2021   Pneumonia    Polysubstance abuse (HCC)    Poor dentition    Stage 3b  chronic kidney disease (HCC) 12/27/2020  Thrombocytosis 09/29/2011   Tobacco dependence 01/23/2016   Tobacco use disorder 01/23/2016   Type 2 diabetes mellitus with diabetic polyneuropathy [facility])    Past Surgical History:  Procedure Laterality Date   HERNIA REPAIR  2009   I & D KNEE WITH POLY EXCHANGE  09/03/2011   Procedure: IRRIGATION AND DEBRIDEMENT KNEE WITH POLY EXCHANGE;  Surgeon: Shelda Pal, MD;  Location: [facility] ORS;  Service: Orthopedics;  Laterality: Left;   TOOTH EXTRACTION Bilateral 08/06/2017   Procedure: DENTAL RESTORATION/EXTRACTIONS;  Surgeon: Ocie Doyne, DDS;  Location: [facility] OR;  Service: Oral Surgery;  Laterality: Bilateral;   TOTAL KNEE ARTHROPLASTY   right  feb 2012   left march 2012 r   TOTAL KNEE REVISION  06/23/2011   Procedure: TOTAL KNEE REVISION;  Surgeon: Shelda Pal;  Location: [facility] ORS;  Service: Orthopedics;  Laterality: Left;  femomal nerve block done in holding area without incident    Family History  Problem Relation Age of Onset   Colon cancer Other    Colon cancer Mother    Social History   Socioeconomic History   Marital status: Widowed    Spouse name: Not on file   Number of children: 5   Years of education: Not on file   Highest education level: Not on file  Occupational History   Occupation: Disabled  Tobacco Use   Smoking status: Every Day    Current packs/day: 1.00    Average packs/day: 1 pack/day for 50.0 years (50.0 ttl pk-yrs)    Types: Cigarettes   Smokeless tobacco: Never  Vaping Use   Vaping status: Former  Substance and Sexual Activity   Alcohol use: Not on file    Comment: heavy drinker in past. occasionally has a beer. Not on regular basis.   Drug use: Not Currently   Sexual activity: Not Currently  Other Topics Concern   Not on file  Social History Narrative   Not on file   Social Determinants of Health   Financial Resource Strain: Low Risk  (08/14/2022)   Overall Financial Resource Strain (CARDIA)    Difficulty of  Paying Living Expenses: Not hard at all  Food Insecurity: No Food Insecurity (08/03/2022)   Hunger Vital Sign    Worried About Running Out of Food in the Last Year: Never true    Ran Out of Food in the Last Year: Never true  Transportation Needs: No Transportation Needs (08/14/2022)   PRAPARE - Administrator, Civil Service (Medical): No    Lack of Transportation (Non-Medical): No  Physical Activity: Inactive (07/24/2022)   Exercise Vital Sign    Days of Exercise per Week: 0 days    Minutes of Exercise per Session: 0 min  Stress: No Stress Concern Present (08/03/2022)   [employer] of Occupational Health - Occupational Stress Questionnaire    Feeling of Stress : Not at all  Social Connections: Socially Isolated (08/03/2022)   Social Connection and Isolation Panel [NHANES]    Frequency of Communication with Friends and Family: More than three times a week    Frequency of Social Gatherings with Friends and Family: More than three times a week    Attends Religious Services: Never    Database administrator or Organizations: No    Attends Banker Meetings: Never    Marital Status: Widowed    Objective:  BP 130/78   Pulse 96   Temp 97.6 F (36.4 C)   Ht 5\' 8"  (1.727 m)  Wt (!) 303 lb (137.4 kg)   SpO2 94%   BMI 46.07 kg/m      02/01/2023   10:13 AM 11/10/2022   10:52 AM 10/13/2022    9:47 AM  BP/Weight  Systolic BP 130 130 112  Diastolic BP 78 80 70  Wt. (Lbs) 303 309 307  BMI 46.07 kg/m2 46.98 kg/m2 46.68 kg/m2    Physical Exam Vitals reviewed.  Constitutional:      Appearance: Normal appearance. He is obese.  Cardiovascular:     Rate and Rhythm: Normal rate and regular rhythm.     Heart sounds: No murmur heard. Pulmonary:     Effort: Pulmonary effort is normal.     Breath sounds: Normal breath sounds.  Abdominal:     General: Abdomen is flat. Bowel sounds are normal.     Palpations: Abdomen is soft.     Tenderness: There is no abdominal  tenderness.  Neurological:     Mental Status: He is alert and oriented to person, place, and time.  Psychiatric:        Mood and Affect: Mood normal.        Behavior: Behavior normal.     Diabetic Foot Exam - Simple   Simple Foot Form  02/01/2023 11:11 PM  Visual Inspection No deformities, no ulcerations, no other skin breakdown bilaterally: Yes Sensation Testing Intact to touch and monofilament testing bilaterally: Yes Pulse Check Posterior Tibialis and Dorsalis pulse intact bilaterally: Yes Comments      Lab Results  Component Value Date   WBC 11.2 (H) 02/01/2023   HGB 16.0 02/01/2023   HCT 47.9 02/01/2023   PLT 454 (H) 02/01/2023   GLUCOSE 150 (H) 02/01/2023   CHOL 206 (H) 02/01/2023   TRIG 140 02/01/2023   HDL 48 02/01/2023   LDLCALC 133 (H) 02/01/2023   ALT 14 02/01/2023   AST 18 02/01/2023   NA 136 02/01/2023   K 4.8 02/01/2023   CL 98 02/01/2023   CREATININE 1.03 02/01/2023   BUN 13 02/01/2023   CO2 22 02/01/2023   TSH 1.140 07/09/2022   INR 1.81 (H) 09/07/2011   HGBA1C 8.0 (H) 02/01/2023   MICROALBUR 80 09/27/2020      Assessment & Plan:    Type 2 diabetes mellitus with diabetic polyneuropathy, without long-term current use of insulin (HCC) Assessment & Plan: Well controlled. Recommend check sugars fasting daily. Recommend check feet daily. Recommend annual eye exams. Continue to work on eating a healthy diet and exercise.  Labs drawn today.     Continue Lantus 30 units daily, metformin 500 mg daily, xigduo  Sent rx for CGM-Dexcom Patients phone is not compatible with dexcom app. Rxs being sent. Patient to call our office if he has not received rx's.  Orders: -     Ozempic (0.25 or 0.5 MG/DOSE); Inject 0.25 mg into the skin once a week.  Dispense: 3 mL; Refill: 0 -     CBC with Differential/Platelet -     Hemoglobin A1c -     Microalbumin / creatinine urine ratio  Lumbar pain Assessment & Plan: Continue celebrex 200 mg daily.  Continue  oxycodone 5/325 mg four times a day.    Mixed hyperlipidemia Assessment & Plan: Well controlled.  No medication changes recommended. Continue rosuvastatin 40 mg before bed and zetia 10 mg before bed.  Continue healthy diet and exercise.   Check labs  Orders: -     Lipid panel  Gastroesophageal reflux disease without esophagitis Assessment &  Plan: Well controlled.  No changes to medicines. Continue pepcid 20 mg twice daily. Protonix 40 mg one twice daily.    Chronic pain syndrome Assessment & Plan: Not at goal. Continue oxycodone/apap 5/325 mg one four times a day as needed    Essential hypertension Assessment & Plan: Well controlled.  No medication changes recommended. Continue Spironolactone 25 mg  daily, metoprolol 25 mg twice a day, amlodipine 5 mg daily..   Continue healthy diet and exercise.    Orders: -     Comprehensive metabolic panel  Morbid obesity with BMI of 45.0-49.9, adult [facility]) Assessment & Plan: Recommend continue to work on eating healthy diet and exercise.    Overflow incontinence of urine Assessment & Plan: Needs chux (2 per day)      Meds ordered this encounter  Medications   Semaglutide,0.25 or 0.5MG /DOS, (OZEMPIC, 0.25 OR 0.5 MG/DOSE,) 2 MG/3ML SOPN    Sig: Inject 0.25 mg into the skin once a week.    Dispense:  3 mL    Refill:  0    Orders Placed This Encounter  Procedures   CBC with Differential/Platelet   Comprehensive metabolic panel   Lipid panel   Hemoglobin A1c   Microalbumin / creatinine urine ratio     Follow-up: Return in about 3 months (around 05/04/2023) for chronic, fasting.   I,Katherina A Bramblett,acting as a scribe for Blane Ohara, MD.,have documented all relevant documentation on the behalf of Blane Ohara, MD,as directed by  Blane Ohara, MD while in the presence of Blane Ohara, MD.   An After Visit Summary was printed and given to the patient.  Blane Ohara, MD [facility] [phone]

## 2023-02-02 NOTE — Assessment & Plan Note (Signed)
Needs chux (2 per day)

## 2023-02-03 ENCOUNTER — Ambulatory Visit (INDEPENDENT_AMBULATORY_CARE_PROVIDER_SITE_OTHER): Payer: 59 | Admitting: Podiatry

## 2023-02-03 DIAGNOSIS — M79609 Pain in unspecified limb: Secondary | ICD-10-CM | POA: Diagnosis not present

## 2023-02-03 DIAGNOSIS — E1142 Type 2 diabetes mellitus with diabetic polyneuropathy: Secondary | ICD-10-CM

## 2023-02-03 DIAGNOSIS — B351 Tinea unguium: Secondary | ICD-10-CM

## 2023-02-03 DIAGNOSIS — L853 Xerosis cutis: Secondary | ICD-10-CM

## 2023-02-03 DIAGNOSIS — M2041 Other hammer toe(s) (acquired), right foot: Secondary | ICD-10-CM | POA: Diagnosis not present

## 2023-02-03 NOTE — Progress Notes (Signed)
Subjective:  Patient ID: Brent Ortiz, male    DOB: July 03, 1961,  MRN: [id number]   [name] presents to clinic today for:  Chief Complaint  Patient presents with   Nail Problem    Diabetic Foot Care-nail trim   PCP-Kirsten [name],MD Last Visit- 02/01/2023   Diabetic Shoes    Patient is interested in getting casted for diabetic shoes.    Eczema    Dry skin to bilateral bottom of feet. Patient does not moisturize his feet. He states that his skin itch sometimes but not all the time.   . Patient notes nails are thick, discolored, elongated and painful in shoegear when trying to ambulate.  Patient notes he has trouble getting lotion onto the bottom of his feet because he cannot reach.  He is interested in new diabetic shoes if he qualifies.  PCP is [name], Kirsten, MD. date last seen was 02/01/2023.  Most recent HbA1c was 8.0  Allergies  Allergen Reactions   Motrin [Ibuprofen] Other (See Comments)    Reaction: ulcers   Buprenorphine Itching    Itching and dizziness   Other Nausea And Vomiting    Patient has ulcers    Review of Systems: Negative except as noted in the HPI.  Objective:  There were no vitals filed for this visit.  Brent Ortiz is a pleasant 62 y.o. male in NAD. AAO x 3.  Vascular Examination: Capillary refill time is 3-5 seconds to toes bilateral.  1/4 palpable pedal pulses b/l LE. Digital hair present b/l.  +1 pitting edema b/l. Skin temperature gradient WNL b/l.   Dermatological Examination: Pedal skin with normal turgor, texture and tone b/l. No open wounds. No interdigital macerations b/l. Toenails x10 are 3mm thick, discolored, dystrophic with subungual debris. There is pain with compression of the nail plates.  They are elongated x10.  Skin is dry and scaly  Neurological Examination: Protective sensation diminished bilateral LE.   Musculoskeletal Examination: Muscle strength 5/5 to all LE muscle groups b/l.  Mild flexible contracture of the  lesser toes right foot     Latest Ref Rng & Units 02/01/2023   11:03 AM 10/13/2022   10:18 AM 07/09/2022    9:59 AM 03/30/2022   12:04 PM  Hemoglobin A1C  Hemoglobin-A1c 4.8 - 5.6 % 8.0  8.0  7.5  9.3    Assessment/Plan: 1. Pain due to onychomycosis of nail   2. Diabetic polyneuropathy associated with type 2 diabetes mellitus (HCC)   3. Xerosis cutis   4. Hammertoe of right foot     The mycotic toenails were sharply debrided x10 with sterile nail nippers and a power debriding burr to decrease bulk/thickness and length.    Discussed the diabetic shoe program with the patient today.  He is interested in getting a pair of diabetic shoes.  Patient prefers a Velcro closure or a slip-in type of shoe.  Will get him scheduled for diabetic shoe consult with our orthotist sometime in the next few weeks.  Patient informed that there are long handled cream/medication applicators that he can either purchase off of Amazon or go to a medical supply store.  Informed patient that most drugstores with do not have this type of vitamin stock.  Informed patient that these usually have a chamber where he can put the lotion into and then there is often a release button at the top of the long-arm handle to release the cream onto the skin and the applicator will help  rub this on.  He stated he will look into this.  He does not need any new creams as he states he has plenty at home  Return in about 3 months (around 05/06/2023) for [facility].   Clerance Lav, DPM, FACFAS [facility]     [address] [place], [place] [address]                Office [phone]  Fax [phone]

## 2023-02-04 ENCOUNTER — Telehealth: Payer: Self-pay

## 2023-02-04 NOTE — Telephone Encounter (Signed)
Debbie from [facility] called to report that they received the order for Brent Ortiz however they have been unable to reach the patient.  He can contact Jamie at [phone].

## 2023-02-05 ENCOUNTER — Other Ambulatory Visit: Payer: Self-pay | Admitting: Family Medicine

## 2023-02-05 DIAGNOSIS — E1142 Type 2 diabetes mellitus with diabetic polyneuropathy: Secondary | ICD-10-CM

## 2023-02-08 ENCOUNTER — Other Ambulatory Visit: Payer: Self-pay | Admitting: Family Medicine

## 2023-02-08 DIAGNOSIS — E1142 Type 2 diabetes mellitus with diabetic polyneuropathy: Secondary | ICD-10-CM

## 2023-02-09 ENCOUNTER — Telehealth: Payer: Self-pay

## 2023-02-09 DIAGNOSIS — E1142 Type 2 diabetes mellitus with diabetic polyneuropathy: Secondary | ICD-10-CM

## 2023-02-09 MED ORDER — OZEMPIC (0.25 OR 0.5 MG/DOSE) 2 MG/3ML ~~LOC~~ SOPN
0.2500 mg | PEN_INJECTOR | SUBCUTANEOUS | 0 refills | Status: DC
Start: 2023-02-09 — End: 2023-03-06

## 2023-02-15 ENCOUNTER — Other Ambulatory Visit: Payer: Self-pay

## 2023-03-01 ENCOUNTER — Other Ambulatory Visit: Payer: Self-pay | Admitting: Family Medicine

## 2023-03-01 DIAGNOSIS — M545 Low back pain, unspecified: Secondary | ICD-10-CM

## 2023-03-06 ENCOUNTER — Other Ambulatory Visit: Payer: Self-pay | Admitting: Family Medicine

## 2023-03-06 DIAGNOSIS — E1142 Type 2 diabetes mellitus with diabetic polyneuropathy: Secondary | ICD-10-CM

## 2023-03-06 MED ORDER — SEMAGLUTIDE (1 MG/DOSE) 4 MG/3ML ~~LOC~~ SOPN: 1.0000 mg | PEN_INJECTOR | SUBCUTANEOUS | 0 refills | Status: DC

## 2023-03-07 ENCOUNTER — Other Ambulatory Visit: Payer: Self-pay | Admitting: Family Medicine

## 2023-03-09 ENCOUNTER — Other Ambulatory Visit: Payer: Self-pay | Admitting: Family Medicine

## 2023-03-09 DIAGNOSIS — F321 Major depressive disorder, single episode, moderate: Secondary | ICD-10-CM

## 2023-03-11 ENCOUNTER — Telehealth: Payer: Self-pay

## 2023-03-11 NOTE — Telephone Encounter (Signed)
Patient called and stated that [facility] is now close and he will like his medication sent to [facility] in [place].  Recommended patient call office when he need refills.

## 2023-03-16 ENCOUNTER — Other Ambulatory Visit: Payer: 59

## 2023-03-23 ENCOUNTER — Telehealth: Payer: Self-pay

## 2023-03-23 NOTE — Telephone Encounter (Signed)
Patient called and stated he wanted [facility] took off his pharmacy list, he want all medication sent to [facility].  He will call once he need a refill for any medication.

## 2023-03-30 ENCOUNTER — Other Ambulatory Visit: Payer: Self-pay

## 2023-03-30 ENCOUNTER — Telehealth: Payer: Self-pay

## 2023-03-30 DIAGNOSIS — M545 Low back pain, unspecified: Secondary | ICD-10-CM

## 2023-03-30 MED ORDER — OXYCODONE-ACETAMINOPHEN 5-325 MG PO TABS
ORAL_TABLET | ORAL | 0 refills | Status: AC
Start: 2023-03-30 — End: ?

## 2023-03-30 NOTE — Telephone Encounter (Signed)
Done. Dr.   

## 2023-03-30 NOTE — Telephone Encounter (Signed)
Prescription Request  03/30/2023   What is the name of the medication or equipment? oxyCODONE-acetaminophen (PERCOCET/ROXICET) 5-325 MG tablet   Have you contacted your pharmacy to request a refill? No   Which pharmacy would you like this sent to?  [id number] - [place], [place] - [address] [place] [address] Phone: [phone] Fax: [phone]    Patient notified that their request is being sent to the clinical staff for review and that they should receive a response within 2 business days.   Please advise at [facility] [phone]

## 2023-04-02 ENCOUNTER — Other Ambulatory Visit: Payer: Self-pay

## 2023-04-02 DIAGNOSIS — M545 Low back pain, unspecified: Secondary | ICD-10-CM

## 2023-04-02 MED ORDER — OXYCODONE-ACETAMINOPHEN 5-325 MG PO TABS: ORAL_TABLET | ORAL | 0 refills | Status: DC

## 2023-04-05 ENCOUNTER — Telehealth: Payer: Self-pay

## 2023-04-05 NOTE — Telephone Encounter (Signed)
Patient called and left voicemail stating that he was needing a refill and wanted a call back and did not leave the medication name or where to send the medication. I tried calling the patient back and the patient's voicemail is full and can't leave a voicemail. So was un able to leave a voice mail.

## 2023-04-07 ENCOUNTER — Encounter: Payer: 59 | Admitting: Podiatry

## 2023-04-10 NOTE — Progress Notes (Signed)
Patient was a no-show for scheduled appointment today.

## 2023-04-22 ENCOUNTER — Other Ambulatory Visit: Payer: Self-pay | Admitting: Family Medicine

## 2023-04-22 DIAGNOSIS — I2510 Atherosclerotic heart disease of native coronary artery without angina pectoris: Secondary | ICD-10-CM

## 2023-04-27 ENCOUNTER — Other Ambulatory Visit: Payer: Self-pay

## 2023-04-27 DIAGNOSIS — M545 Low back pain, unspecified: Secondary | ICD-10-CM

## 2023-04-27 MED ORDER — OXYCODONE-ACETAMINOPHEN 5-325 MG PO TABS: ORAL_TABLET | ORAL | 0 refills | Status: DC

## 2023-05-10 NOTE — Assessment & Plan Note (Signed)
Well controlled.  No changes to medicines. Continue pepcid 20 mg twice daily. Protonix 40 mg one twice daily.

## 2023-05-10 NOTE — Progress Notes (Unsigned)
Subjective:  Patient ID: Brent Ortiz, male    DOB: [birth year]  Age: 62 y.o. MRN: [id number]  Chief Complaint  Patient presents with   Medical Management of Chronic Issues    HPI DMII: Complicated by hyperlipidemia, hypertension, polyneuropathy.   Checks sugars 2-3 times per week.  Sugars:130-143 Current medicines: Lantus 30 units daily, ozempic 1 mg weekly, xigduo 5-500 mg daily Checks feet most days. Has podiatry appt this Wednesday plans to ask about diabetic shoes, again. Over due for eye doctor. Needs to schedule appt. Last A1C was 8.0.  Hyperlipidemia: Rosuvastatin 40 mg daily, Not on Zetia 10 mg daily.   Hypertension: On Spironolactone 25 mg daily, metoprolol 25 mg twice a day, amlodipine 5 mg daily. Patient is working on [employer]. Unable to exercise.     CONGESTIVE HEART FAILURE/CAD: He takes Entresto 24-26 mg twice a day, Isosorbide 60 mg daily, Spironolactone 25 mg daily, Ranexa 500 mg twice daily, metoprolol XL 25 mg daily. Cardiologist   Gout:  Allopurinol 300 mg daily. Colchicine as needed.    Constipation: on miralax 17 mg daily helps.    GERD: pepcid 20 mg twice daily. Lansoprazole 30 mg daily.    Insomnia: on trazodone 150 mg before bed. Works Firefighter.    Depression: on zoloft 25 mg daily.    Chronic Back Pain: Oxycodone 5-325 mg QID, pain scale 6/10. Uses rollator for ADLs at home, currently uses a cane due to rollator not fitting into his car.   Bladder incontinence: needs chux for incontinence management and hygiene.     05/11/2023   10:25 AM 02/01/2023   10:33 AM 11/10/2022   10:57 AM 10/13/2022    9:49 AM 07/24/2022    1:39 PM  Depression screen PHQ 2/9  Decreased Interest 1 3 1  0 0  Down, Depressed, Hopeless 1 1 1  0 0  PHQ - 2 Score 2 4 2  0 0  Altered sleeping 0 0 1  0  Tired, decreased energy 0 2 1  0  Change in appetite 0 2 2  0  Feeling bad or failure about yourself  1 2 2   0  Trouble concentrating 0 1 1  0  Moving slowly or  fidgety/restless 1 1 1   0  Suicidal thoughts 0 0 0  0  PHQ-9 Score 4 12 10   0  Difficult doing work/chores Not difficult at all Somewhat difficult Somewhat difficult  Not difficult at all        05/11/2023   10:24 AM  Fall Risk   Falls in the past year? 1  Number falls in past yr: 1  Injury with Fall? 0  Risk for fall due to : Impaired balance/gait  Follow up Falls evaluation completed    Patient Care Team: Blane Ohara, MD as PCP - General (Family Medicine) Zettie Pho, [facility] (Inactive) as Pharmacist (Pharmacist) [name], Aundra Dubin, MD as Consulting Physician (Cardiology) Freddie Breech, DPM as Consulting Physician (Podiatry)   Review of Systems  Constitutional:  Negative for chills, diaphoresis, fatigue and fever.  HENT:  Negative for congestion, ear pain and sore throat.   Respiratory:  Negative for cough and shortness of breath.   Cardiovascular:  Negative for chest pain and leg swelling.  Gastrointestinal:  Negative for abdominal pain, constipation, diarrhea, nausea and vomiting.  Genitourinary:  Negative for dysuria and urgency.  Musculoskeletal:  Positive for back pain. Negative for arthralgias and myalgias.  Neurological:  Negative for dizziness and headaches.  Psychiatric/Behavioral:  Negative for dysphoric mood.     Current Outpatient Medications on File Prior to Visit  Medication Sig Dispense Refill   Accu-Chek Softclix Lancets lancets Use as instructed 100 each 5   albuterol (VENTOLIN HFA) 108 (90 Base) MCG/ACT inhaler Inhale 2 puffs into the lungs every 4 (four) hours as needed for wheezing or shortness of breath. 25.5 g 3   allopurinol (ZYLOPRIM) 300 MG tablet Take 1 tablet (300 mg total) by mouth daily. 90 tablet 3   amLODipine (NORVASC) 5 MG tablet TAKE 1 TABLET BY MOUTH ONCE DAILY 30 tablet 0   ammonium lactate (LAC-HYDRIN) 12 % lotion Apply 1 Application topically as needed for dry skin. 400 g 5   aspirin EC (ASPIRIN LOW DOSE) 81 MG tablet TAKE 1  TABLET BY MOUTH ONCE DAILY *SWALLOW WHOLE. YELLOW ROUND TAB WITH A HEART* 30 tablet 0   Blood Glucose Monitoring Suppl (ACCU-CHEK GUIDE) w/Device KIT 1 each by Does not apply route in the morning, at noon, and at bedtime. 1 kit 0   celecoxib (CELEBREX) 200 MG capsule Take 1 capsule (200 mg total) by mouth daily. 90 capsule 1   clopidogrel (PLAVIX) 75 MG tablet TAKE 1 TABLET BY MOUTH ONCE DAILY (ROUND PINK TABLET WITH E 34) 90 tablet 1   colchicine 0.6 MG tablet TAKE 1 TABLET(0.6 MG) BY MOUTH TWICE DAILY FOR 7 DAYS THEN TAKE 1 TABLET(0.6 MG) BY MOUTH DAILY FOR 23 DAYS 180 tablet 1   ezetimibe (ZETIA) 10 MG tablet Take 10 mg by mouth daily. (Patient not taking: Reported on 08/14/2022)     famotidine (PEPCID) 20 MG tablet TAKE ONE (1) TABLET BY MOUTH TWICE DAILY 60 tablet 0   gabapentin (NEURONTIN) 300 MG capsule TAKE 1 CAPSULE (300MG ) BY MOUTH TWICE DAILY.(YELLOW CAP) 180 capsule 1   Glucagon, rDNA, (GLUCAGON EMERGENCY) 1 MG KIT Inject 1 mg into the muscle as needed (BG less than 70).     glucose blood (ACCU-CHEK GUIDE) test strip Use as instructed 100 each 5   insulin glargine (LANTUS SOLOSTAR) 100 UNIT/ML Solostar Pen ADMINISTER 30 UNITS UNDER THE SKIN DAILY 15 mL 3   isosorbide mononitrate (IMDUR) 60 MG 24 hr tablet TAKE 1 TABLET BY MOUTH ONCE DAILY 30 tablet 0   lansoprazole (PREVACID) 30 MG capsule TAKE 1 CAPSULE BY MOUTH AT NOON 30 capsule 0   metoprolol succinate (TOPROL-XL) 25 MG 24 hr tablet TAKE 1 TABLET BY MOUTH ONCE DAILY 30 tablet 0   nitroGLYCERIN (NITROSTAT) 0.4 MG SL tablet DISSOLVE 1 TABLET UNDER TONGUE EVERY 5 MINUTES AS NEEDED CHEST PAIN. 25 tablet 1   oxyCODONE-acetaminophen (PERCOCET/ROXICET) 5-325 MG tablet TAKE ONE TABLET BY MOUTH FOUR times daily AS DIRECTED 120 tablet 0   polyethylene glycol (MIRALAX / GLYCOLAX) 17 g packet Take 17 g by mouth daily.     ranolazine (RANEXA) 500 MG 12 hr tablet TAKE ONE TABLET BY MOUTH TWICE DAILY (blue oblong tablet with H R18, or PINK OBLONG  TABLET WITH RL49)) 186 tablet 0   rosuvastatin (CRESTOR) 40 MG tablet Take 1 tablet (40 mg total) by mouth daily. 90 tablet 1   sacubitril-valsartan (ENTRESTO) 24-26 MG TAKE ONE (1) TABLET BY MOUTH TWICE DAILY 60 tablet 10   Semaglutide, 1 MG/DOSE, (OZEMPIC, 1 MG/DOSE,) 4 MG/3ML SOPN INJECT 1 MG SUBCUTANEOUSLY ONCE A WEEK AS DIRECTED 3 mL 0   sertraline (ZOLOFT) 25 MG tablet TAKE 1 TABLET BY MOUTH ONCE DAILY 90 tablet 1   spironolactone (ALDACTONE) 25 MG tablet TAKE 1/2  TABLET(12.5) BY MOUTH DAILY. 135 tablet 1   tamsulosin (FLOMAX) 0.4 MG CAPS capsule TAKE 1 CAPSULE (.4MG  TOTAL) BY MOUTH DAILY.(GREEN AND GOLD CAPSULE WITH W 516) 90 capsule 1   traZODone (DESYREL) 150 MG tablet TAKE 1 TABLET BY MOUTH EVERY DAY AT BEDTIME 90 tablet 1   ULTICARE SHORT PEN NEEDLES 31G X 8 MM MISC USE AS DIRECTED (ONCE DAILY WITH LANTUS INJECTION AS INSTRUCTED) 100 each 4   XIGDUO XR 5-500 MG TB24 TAKE 1 TABLET BY MOUTH DAILY 90 tablet 1   No current facility-administered medications on file prior to visit.   Past Medical History:  Diagnosis Date   Acute combined systolic and diastolic CHF, NYHA class 2 (HCC) 01/22/2020   Acute gout of left elbow 02/14/2022   Acute idiopathic gout of right wrist 12/29/2021   Alcohol abuse    Atherosclerosis of coronary artery of native heart with stable angina pectoris, unspecified vessel or lesion type (HCC) 01/23/2016   Overview:  Completely occluded circumflex artery proximal to obtuse marginal 1 basin cardiac catheter from 2017   Atherosclerosis of native coronary artery of native heart without angina pectoris 01/23/2016   Overview:  Completely occluded circumflex artery proximal to obtuse marginal 1 basin cardiac catheter from 2017   Bilateral hip pain 11/17/2021   BMI 45.0-49.9, adult (HCC) 01/13/2013   Cellulitis of right upper extremity 01/01/2022   Centrilobular emphysema (HCC) 01/16/2022   Chronic kidney disease    Chronic pain 10/19/2019   Sees pain clinic   COPD  (chronic obstructive pulmonary disease) (HCC)    COPD exacerbation (HCC) 10/03/2019   Coronary artery disease involving native heart 02/08/2020   Coronary atherosclerosis of native coronary artery 01/23/2016   Overview:  Completely occluded circumflex artery proximal to obtuse marginal 1 basin cardiac catheter from 2017   Depression, major, single episode, moderate (HCC) 07/11/2020   Diabetic polyneuropathy (HCC) 12/06/2019   ED (erectile dysfunction) 12/06/2019   Essential hypertension 01/23/2016   Essential thrombocythemia (HCC) 09/29/2011   GERD (gastroesophageal reflux disease) 11/17/2021   GERD without esophagitis    HFrEF (heart failure with reduced ejection fraction) (HCC) 02/08/2020   Knee pain, left 02/12/2012   Formatting of this note might be different from the original. Left   Leukocytosis 09/04/2011   Lumbar pain 11/17/2021   Mild major depression, single episode (HCC) 02/14/2022   Mixed hyperlipidemia 06/18/2017   Mixed incontinence 02/24/2022   Morbid obesity (HCC) 11/21/2020   Morbid obesity with BMI of 40.0-44.9, adult (HCC) 01/13/2013   Myocardial infarction (HCC)    04-13-2011   Obstructive chronic bronchitis with exacerbation (HCC) 10/03/2019   Obstructive sleep apnea 07/18/2011   Olecranon bursitis of left elbow 02/14/2022   Olecranon bursitis of left elbow 02/14/2022   Opiate abuse, continuous (HCC)    Osteoarthritis    Pain in knee joint 02/12/2012   Formatting of this note might be different from the original. Left   Personal history of stroke with residual effects 07/24/2021   Pneumonia    Polysubstance abuse (HCC)    Poor dentition    Stage 3b chronic kidney disease (HCC) 12/27/2020   Thrombocytosis 09/29/2011   Tobacco dependence 01/23/2016   Tobacco use disorder 01/23/2016   Type 2 diabetes mellitus with diabetic polyneuropathy [facility])    Past Surgical History:  Procedure Laterality Date   HERNIA REPAIR  2009   I & D KNEE WITH POLY EXCHANGE  09/03/2011    Procedure: IRRIGATION AND DEBRIDEMENT KNEE WITH POLY EXCHANGE;  Surgeon:  Shelda Pal, MD;  Location: [facility] ORS;  Service: Orthopedics;  Laterality: Left;   TOOTH EXTRACTION Bilateral 08/06/2017   Procedure: DENTAL RESTORATION/EXTRACTIONS;  Surgeon: Ocie Doyne, DDS;  Location: [facility] OR;  Service: Oral Surgery;  Laterality: Bilateral;   TOTAL KNEE ARTHROPLASTY   right  feb 2012   left march 2012 r   TOTAL KNEE REVISION  06/23/2011   Procedure: TOTAL KNEE REVISION;  Surgeon: Shelda Pal;  Location: [facility];  Service: Orthopedics;  Laterality: Left;  femomal nerve block done in holding area without incident    Family History  Problem Relation Age of Onset   Colon cancer Other    Colon cancer Mother    Social History   Socioeconomic History   Marital status: Widowed    Spouse name: Not on file   Number of children: 5   Years of education: Not on file   Highest education level: Not on file  Occupational History   Occupation: Disabled  Tobacco Use   Smoking status: Every Day    Current packs/day: 1.00    Average packs/day: 1 pack/day for 50.0 years (50.0 ttl pk-yrs)    Types: Cigarettes   Smokeless tobacco: Never  Vaping Use   Vaping status: Former  Substance and Sexual Activity   Alcohol use: Not on file    Comment: heavy drinker in past. occasionally has a beer. Not on regular basis.   Drug use: Not Currently   Sexual activity: Not Currently  Other Topics Concern   Not on file  Social History Narrative   Not on file   Social Determinants of Health   Financial Resource Strain: Low Risk  (08/14/2022)   Overall Financial Resource Strain (CARDIA)    Difficulty of Paying Living Expenses: Not hard at all  Food Insecurity: No Food Insecurity (08/03/2022)   Hunger Vital Sign    Worried About Running Out of Food in the Last Year: Never true    Ran Out of Food in the Last Year: Never true  Transportation Needs: No Transportation Needs (08/14/2022)   PRAPARE - Scientist, research (physical sciences) (Medical): No    Lack of Transportation (Non-Medical): No  Physical Activity: Inactive (07/24/2022)   Exercise Vital Sign    Days of Exercise per Week: 0 days    Minutes of Exercise per Session: 0 min  Stress: No Stress Concern Present (08/03/2022)   [employer] of Occupational Health - Occupational Stress Questionnaire    Feeling of Stress : Not at all  Social Connections: Socially Isolated (08/03/2022)   Social Connection and Isolation Panel [NHANES]    Frequency of Communication with Friends and Family: More than three times a week    Frequency of Social Gatherings with Friends and Family: More than three times a week    Attends Religious Services: Never    Database administrator or Organizations: No    Attends Banker Meetings: Never    Marital Status: Widowed    Objective:  BP 112/78   Pulse 91   Temp (!) 97.5 F (36.4 C)   Ht 5\' 8"  (1.727 m)   Wt 298 lb (135.2 kg)   SpO2 98%   BMI 45.31 kg/m      05/11/2023   10:21 AM 02/01/2023   10:13 AM 11/10/2022   10:52 AM  BP/Weight  Systolic BP 112 130 130  Diastolic BP 78 78 80  Wt. (Lbs) 298 303 309  BMI 45.31 kg/m2 46.07  kg/m2 46.98 kg/m2    Physical Exam Vitals reviewed.  Constitutional:      Appearance: Normal appearance.  Neck:     Vascular: No carotid bruit.  Cardiovascular:     Rate and Rhythm: Normal rate and regular rhythm.     Pulses: Normal pulses.     Heart sounds: Normal heart sounds.  Pulmonary:     Effort: Pulmonary effort is normal.     Breath sounds: Normal breath sounds. No wheezing, rhonchi or rales.  Abdominal:     General: Bowel sounds are normal.     Palpations: Abdomen is soft.     Tenderness: There is no abdominal tenderness.  Neurological:     Mental Status: He is alert and oriented to person, place, and time.  Psychiatric:        Mood and Affect: Mood normal.        Behavior: Behavior normal.     Diabetic Foot Exam - Simple   Simple Foot Form   05/11/2023  5:41 PM  Visual Inspection See comments: Yes Sensation Testing See comments: Yes Pulse Check Posterior Tibialis and Dorsalis pulse intact bilaterally: Yes Comments Decreased sensation.  Dirty feet.  Calluses.      Lab Results  Component Value Date   WBC 10.9 (H) 05/11/2023   HGB 16.1 05/11/2023   HCT 49.1 05/11/2023   PLT 395 05/11/2023   GLUCOSE 170 (H) 05/11/2023   CHOL 191 05/11/2023   TRIG 183 (H) 05/11/2023   HDL 41 05/11/2023   LDLCALC 118 (H) 05/11/2023   ALT 10 05/11/2023   AST 12 05/11/2023   NA 138 05/11/2023   K 5.1 05/11/2023   CL 98 05/11/2023   CREATININE 1.27 05/11/2023   BUN 9 05/11/2023   CO2 23 05/11/2023   TSH 1.140 07/09/2022   INR 1.81 (H) 09/07/2011   HGBA1C 9.2 (H) 05/11/2023   MICROALBUR 80 09/27/2020      Assessment & Plan:    Encounter for Medicare annual wellness exam Assessment & Plan: Things to do to keep yourself healthy  - Exercise at least 30-45 minutes a day, 3-4 days a week.  - Eat a low-fat diet with lots of fruits and vegetables, up to 7-9 servings per day.  - Seatbelts can save your life. Wear them always.  - Smoke detectors on every level of your home, check batteries every year.  - Eye Doctor - have an eye exam every 1-2 years  - Safe sex - if you may be exposed to STDs, use a condom.  - Alcohol -  If you drink, do it moderately, less than 2 drinks per day.  - Health Care Power of Attorney. Choose someone to speak for you if you are not able.  - Depression is common in our stressful world.If you're feeling down or losing interest in things you normally enjoy, please come in for a visit.  - Violence - If anyone is threatening or hurting you, please call immediately.    Essential hypertension Assessment & Plan: Well controlled.  No medication changes recommended. Continue Spironolactone 25 mg  daily, metoprolol 25 mg twice a day, amlodipine 5 mg daily..   Continue healthy diet and exercise.    Orders: -      CBC with Differential/Platelet -     Comprehensive metabolic panel -     AMB Referral [facility] Care Management  Gastroesophageal reflux disease without esophagitis Assessment & Plan: Well controlled.  No changes to medicines. Continue pepcid 20 mg  twice daily. Protonix 40 mg one twice daily.    Mixed hyperlipidemia Assessment & Plan: Well controlled.  No medication changes recommended. Continue rosuvastatin 40 mg before bed and zetia 10 mg before bed.  Continue healthy diet and exercise.   Check labs  Orders: -     Lipid panel -     AMB Referral [facility]BCI Care Management  Type 2 diabetes mellitus with diabetic polyneuropathy, without long-term current use of insulin (HCC) Assessment & Plan: Well controlled. Recommend check sugars fasting daily. Recommend check feet daily. Recommend annual eye exams. Continue to work on eating a healthy diet and exercise.  Labs drawn today.     Continue Lantus 30 units daily, ozempic 1 mg weekly, xigduo 5-500 mg daily   Orders: -     Hemoglobin A1c -     AMB Referral [facility]BCI Care Management  Chronic pain syndrome Assessment & Plan: The current medical regimen is effective;  continue present plan and medications.    Uncomplicated opioid dependence (HCC) Assessment & Plan: Check UDS.   Orders: -     Pain Mgt Scrn (14 Drugs), Ur  Simple chronic bronchitis (HCC) Assessment & Plan: Sent Trelegy inhaler.  Orders: -     Trelegy Ellipta; INHALE 1 PUFF ONCE DAILY  Dispense: 60 each; Refill: 2  Encounter for immunization -     Flu Vaccine Trivalent High Dose (Fluad) -     Pfizer Comirnaty Covid-19 Vaccine 84yrs & older     Meds ordered this encounter  Medications   TRELEGY ELLIPTA 100-62.5-25 MCG/ACT AEPB    Sig: INHALE 1 PUFF ONCE DAILY    Dispense:  60 each    Refill:  2    Orders Placed This Encounter  Procedures   Flu Vaccine Trivalent High Dose (Fluad)   Pfizer Comirnaty Covid-19 Vaccine 65yrs & older   CBC with  Differential/Platelet   Comprehensive metabolic panel   Hemoglobin A1c   Lipid panel   Pain Mgt Scrn (14 Drugs), Ur   AMB Referral [facility]BCI Care Management     Follow-up: Return in 1 year (on 05/10/2024) for awv.   I,Marla I Leal-Borjas,acting as a scribe for Blane Ohara, MD.,have documented all relevant documentation on the behalf of Blane Ohara, MD,as directed by  Blane Ohara, MD while in the presence of Blane Ohara, MD.   An After Visit Summary was printed and given to the patient.  I attest that I have reviewed this visit and agree with the plan scribed by my staff.   Blane Ohara, MD [facility] [phone]

## 2023-05-10 NOTE — Assessment & Plan Note (Signed)
Well controlled.  No medication changes recommended. Continue Spironolactone 25 mg  daily, metoprolol 25 mg twice a day, amlodipine 5 mg daily..   Continue healthy diet and exercise.

## 2023-05-10 NOTE — Assessment & Plan Note (Signed)
Well controlled.  No medication changes recommended. Continue rosuvastatin 40 mg before bed and zetia 10 mg before bed.  Continue healthy diet and exercise.   Check labs

## 2023-05-11 ENCOUNTER — Encounter: Payer: Self-pay | Admitting: Family Medicine

## 2023-05-11 ENCOUNTER — Ambulatory Visit (INDEPENDENT_AMBULATORY_CARE_PROVIDER_SITE_OTHER): Payer: 59 | Admitting: Family Medicine

## 2023-05-11 VITALS — BP 112/78 | HR 91 | Temp 97.5°F | Ht 68.0 in | Wt 298.0 lb

## 2023-05-11 DIAGNOSIS — J410 Simple chronic bronchitis: Secondary | ICD-10-CM

## 2023-05-11 DIAGNOSIS — I10 Essential (primary) hypertension: Secondary | ICD-10-CM | POA: Diagnosis not present

## 2023-05-11 DIAGNOSIS — Z0000 Encounter for general adult medical examination without abnormal findings: Secondary | ICD-10-CM

## 2023-05-11 DIAGNOSIS — Z23 Encounter for immunization: Secondary | ICD-10-CM

## 2023-05-11 DIAGNOSIS — K219 Gastro-esophageal reflux disease without esophagitis: Secondary | ICD-10-CM | POA: Diagnosis not present

## 2023-05-11 DIAGNOSIS — G894 Chronic pain syndrome: Secondary | ICD-10-CM

## 2023-05-11 DIAGNOSIS — E1142 Type 2 diabetes mellitus with diabetic polyneuropathy: Secondary | ICD-10-CM

## 2023-05-11 DIAGNOSIS — F1120 Opioid dependence, uncomplicated: Secondary | ICD-10-CM

## 2023-05-11 DIAGNOSIS — E782 Mixed hyperlipidemia: Secondary | ICD-10-CM

## 2023-05-11 MED ORDER — TRELEGY ELLIPTA 100-62.5-25 MCG/ACT IN AEPB: INHALATION_SPRAY | RESPIRATORY_TRACT | 2 refills | Status: DC

## 2023-05-11 NOTE — Progress Notes (Signed)
Subjective:   Brent Ortiz is a 62 y.o. male who presents for Medicare Annual/Subsequent preventive examination.  Visit Complete: In person  Patient Medicare AWV questionnaire was completed by the patient; I have confirmed that all information answered by patient is correct and no changes since this date.  Cardiac Risk Factors include: advanced age (>58men, >98 women);diabetes mellitus;hypertension;obesity (BMI >30kg/m2);smoking/ tobacco exposure     Objective:    Today's Vitals   05/11/23 1021 05/11/23 1034  BP: 112/78   Pulse: 91   Temp: (!) 97.5 F (36.4 C)   SpO2: 98%   Weight: 298 lb (135.2 kg)   Height: 5\' 8"  (1.727 m)   PainSc:  6    Body mass index is 45.31 kg/m.     05/11/2023   10:34 AM 08/06/2017    6:40 AM 12/29/2016    2:44 AM 12/28/2016    6:26 PM 09/04/2011    1:00 AM 09/03/2011    6:24 PM 06/23/2011    6:32 PM  Advanced Directives  Does Patient Have a Medical Advance Directive? No No No No Patient does not have advance directive;Patient would not like information Patient does not have advance directive;Patient would not like information Patient would not like information;Patient does not have advance directive  Would patient like information on creating a medical advance directive? Yes (ED - Information included in AVS)  No - Patient declined;Yes (ED - Information included in AVS) No - Patient declined     Pre-existing out of facility DNR order (yellow form or pink MOST form)     No No     Current Medications (verified) Outpatient Encounter Medications as of 05/11/2023  Medication Sig   Accu-Chek Softclix Lancets lancets Use as instructed   albuterol (VENTOLIN HFA) 108 (90 Base) MCG/ACT inhaler Inhale 2 puffs into the lungs every 4 (four) hours as needed for wheezing or shortness of breath.   allopurinol (ZYLOPRIM) 300 MG tablet Take 1 tablet (300 mg total) by mouth daily.   amLODipine (NORVASC) 5 MG tablet TAKE 1 TABLET BY MOUTH ONCE DAILY   ammonium  lactate (LAC-HYDRIN) 12 % lotion Apply 1 Application topically as needed for dry skin.   aspirin EC (ASPIRIN LOW DOSE) 81 MG tablet TAKE 1 TABLET BY MOUTH ONCE DAILY *SWALLOW WHOLE. YELLOW ROUND TAB WITH A HEART*   Blood Glucose Monitoring Suppl (ACCU-CHEK GUIDE) w/Device KIT 1 each by Does not apply route in the morning, at noon, and at bedtime.   celecoxib (CELEBREX) 200 MG capsule Take 1 capsule (200 mg total) by mouth daily.   clopidogrel (PLAVIX) 75 MG tablet TAKE 1 TABLET BY MOUTH ONCE DAILY (ROUND PINK TABLET WITH E 34)   colchicine 0.6 MG tablet TAKE 1 TABLET(0.6 MG) BY MOUTH TWICE DAILY FOR 7 DAYS THEN TAKE 1 TABLET(0.6 MG) BY MOUTH DAILY FOR 23 DAYS   ezetimibe (ZETIA) 10 MG tablet Take 10 mg by mouth daily. (Patient not taking: Reported on 08/14/2022)   famotidine (PEPCID) 20 MG tablet TAKE ONE (1) TABLET BY MOUTH TWICE DAILY   gabapentin (NEURONTIN) 300 MG capsule TAKE 1 CAPSULE (300MG ) BY MOUTH TWICE DAILY.(YELLOW CAP)   Glucagon, rDNA, (GLUCAGON EMERGENCY) 1 MG KIT Inject 1 mg into the muscle as needed (BG less than 70).   glucose blood (ACCU-CHEK GUIDE) test strip Use as instructed   insulin glargine (LANTUS SOLOSTAR) 100 UNIT/ML Solostar Pen ADMINISTER 30 UNITS UNDER THE SKIN DAILY   isosorbide mononitrate (IMDUR) 60 MG 24 hr tablet TAKE 1  TABLET BY MOUTH ONCE DAILY   lansoprazole (PREVACID) 30 MG capsule TAKE 1 CAPSULE BY MOUTH AT NOON   metoprolol succinate (TOPROL-XL) 25 MG 24 hr tablet TAKE 1 TABLET BY MOUTH ONCE DAILY   nitroGLYCERIN (NITROSTAT) 0.4 MG SL tablet DISSOLVE 1 TABLET UNDER TONGUE EVERY 5 MINUTES AS NEEDED CHEST PAIN.   oxyCODONE-acetaminophen (PERCOCET/ROXICET) 5-325 MG tablet TAKE ONE TABLET BY MOUTH FOUR times daily AS DIRECTED   polyethylene glycol (MIRALAX / GLYCOLAX) 17 g packet Take 17 g by mouth daily.   ranolazine (RANEXA) 500 MG 12 hr tablet TAKE ONE TABLET BY MOUTH TWICE DAILY (blue oblong tablet with H R18, or PINK OBLONG TABLET WITH RL49))   rosuvastatin  (CRESTOR) 40 MG tablet Take 1 tablet (40 mg total) by mouth daily.   sacubitril-valsartan (ENTRESTO) 24-26 MG TAKE ONE (1) TABLET BY MOUTH TWICE DAILY   Semaglutide, 1 MG/DOSE, (OZEMPIC, 1 MG/DOSE,) 4 MG/3ML SOPN INJECT 1 MG SUBCUTANEOUSLY ONCE A WEEK AS DIRECTED   sertraline (ZOLOFT) 25 MG tablet TAKE 1 TABLET BY MOUTH ONCE DAILY   spironolactone (ALDACTONE) 25 MG tablet TAKE 1/2 TABLET(12.5) BY MOUTH DAILY.   tamsulosin (FLOMAX) 0.4 MG CAPS capsule TAKE 1 CAPSULE (.4MG  TOTAL) BY MOUTH DAILY.(GREEN AND GOLD CAPSULE WITH W 516)   traZODone (DESYREL) 150 MG tablet TAKE 1 TABLET BY MOUTH EVERY DAY AT BEDTIME   TRELEGY ELLIPTA 100-62.5-25 MCG/ACT AEPB INHALE 1 PUFF ONCE DAILY   ULTICARE SHORT PEN NEEDLES 31G X 8 MM MISC USE AS DIRECTED (ONCE DAILY WITH LANTUS INJECTION AS INSTRUCTED)   XIGDUO XR 5-500 MG TB24 TAKE 1 TABLET BY MOUTH DAILY   [DISCONTINUED] nicotine (NICODERM CQ - DOSED IN MG/24 HR) 7 mg/24hr patch APPLY 1 PATCH ONTO THE SKIN ONCE DAILY ** DISCARD PROPERLY   [DISCONTINUED] pantoprazole (PROTONIX) 40 MG tablet Take 1 tablet (40 mg total) by mouth daily.   [DISCONTINUED] TRELEGY ELLIPTA 100-62.5-25 MCG/ACT AEPB INHALE 1 PUFF ONCE DAILY   No facility-administered encounter medications on file as of 05/11/2023.    Allergies (verified) Motrin [ibuprofen], Buprenorphine, and Other   History: Past Medical History:  Diagnosis Date   Acute combined systolic and diastolic CHF, NYHA class 2 (HCC) 01/22/2020   Acute gout of left elbow 02/14/2022   Acute idiopathic gout of right wrist 12/29/2021   Alcohol abuse    Atherosclerosis of coronary artery of native heart with stable angina pectoris, unspecified vessel or lesion type (HCC) 01/23/2016   Overview:  Completely occluded circumflex artery proximal to obtuse marginal 1 basin cardiac catheter from 2017   Atherosclerosis of native coronary artery of native heart without angina pectoris 01/23/2016   Overview:  Completely occluded circumflex  artery proximal to obtuse marginal 1 basin cardiac catheter from 2017   Bilateral hip pain 11/17/2021   BMI 45.0-49.9, adult (HCC) 01/13/2013   Cellulitis of right upper extremity 01/01/2022   Centrilobular emphysema (HCC) 01/16/2022   Chronic kidney disease    Chronic pain 10/19/2019   Sees pain clinic   COPD (chronic obstructive pulmonary disease) (HCC)    COPD exacerbation (HCC) 10/03/2019   Coronary artery disease involving native heart 02/08/2020   Coronary atherosclerosis of native coronary artery 01/23/2016   Overview:  Completely occluded circumflex artery proximal to obtuse marginal 1 basin cardiac catheter from 2017   Depression, major, single episode, moderate (HCC) 07/11/2020   Diabetic polyneuropathy (HCC) 12/06/2019   ED (erectile dysfunction) 12/06/2019   Essential hypertension 01/23/2016   Essential thrombocythemia (HCC) 09/29/2011   GERD (gastroesophageal  reflux disease) 11/17/2021   GERD without esophagitis    HFrEF (heart failure with reduced ejection fraction) (HCC) 02/08/2020   Knee pain, left 02/12/2012   Formatting of this note might be different from the original. Left   Leukocytosis 09/04/2011   Lumbar pain 11/17/2021   Mild major depression, single episode (HCC) 02/14/2022   Mixed hyperlipidemia 06/18/2017   Mixed incontinence 02/24/2022   Morbid obesity (HCC) 11/21/2020   Morbid obesity with BMI of 40.0-44.9, adult (HCC) 01/13/2013   Myocardial infarction (HCC)    04-13-2011   Obstructive chronic bronchitis with exacerbation (HCC) 10/03/2019   Obstructive sleep apnea 07/18/2011   Olecranon bursitis of left elbow 02/14/2022   Olecranon bursitis of left elbow 02/14/2022   Opiate abuse, continuous (HCC)    Osteoarthritis    Pain in knee joint 02/12/2012   Formatting of this note might be different from the original. Left   Personal history of stroke with residual effects 07/24/2021   Pneumonia    Polysubstance abuse (HCC)    Poor dentition    Stage 3b chronic  kidney disease (HCC) 12/27/2020   Thrombocytosis 09/29/2011   Tobacco dependence 01/23/2016   Tobacco use disorder 01/23/2016   Type 2 diabetes mellitus with diabetic polyneuropathy [facility])    Past Surgical History:  Procedure Laterality Date   HERNIA REPAIR  2009   I & D KNEE WITH POLY EXCHANGE  09/03/2011   Procedure: IRRIGATION AND DEBRIDEMENT KNEE WITH POLY EXCHANGE;  Surgeon: Shelda Pal, MD;  Location: [facility];  Service: Orthopedics;  Laterality: Left;   TOOTH EXTRACTION Bilateral 08/06/2017   Procedure: DENTAL RESTORATION/EXTRACTIONS;  Surgeon: Ocie Doyne, DDS;  Location: [facility] OR;  Service: Oral Surgery;  Laterality: Bilateral;   TOTAL KNEE ARTHROPLASTY   right  feb 2012   left march 2012 r   TOTAL KNEE REVISION  06/23/2011   Procedure: TOTAL KNEE REVISION;  Surgeon: Shelda Pal;  Location: [facility];  Service: Orthopedics;  Laterality: Left;  femomal nerve block done in holding area without incident   Family History  Problem Relation Age of Onset   Colon cancer Other    Colon cancer Mother    Social History   Socioeconomic History   Marital status: Widowed    Spouse name: Not on file   Number of children: 5   Years of education: Not on file   Highest education level: Not on file  Occupational History   Occupation: Disabled  Tobacco Use   Smoking status: Every Day    Current packs/day: 1.00    Average packs/day: 1 pack/day for 50.0 years (50.0 ttl pk-yrs)    Types: Cigarettes   Smokeless tobacco: Never  Vaping Use   Vaping status: Former  Substance and Sexual Activity   Alcohol use: Not on file    Comment: heavy drinker in past. occasionally has a beer. Not on regular basis.   Drug use: Not Currently   Sexual activity: Not Currently  Other Topics Concern   Not on file  Social History Narrative   Not on file   Social Determinants of Health   Financial Resource Strain: Low Risk  (08/14/2022)   Overall Financial Resource Strain (CARDIA)    Difficulty of Paying  Living Expenses: Not hard at all  Food Insecurity: No Food Insecurity (08/03/2022)   Hunger Vital Sign    Worried About Running Out of Food in the Last Year: Never true    Ran Out of Food in the Last Year: Never  true  Transportation Needs: No Transportation Needs (08/14/2022)   PRAPARE - Administrator, Civil Service (Medical): No    Lack of Transportation (Non-Medical): No  Physical Activity: Inactive (07/24/2022)   Exercise Vital Sign    Days of Exercise per Week: 0 days    Minutes of Exercise per Session: 0 min  Stress: No Stress Concern Present (08/03/2022)   [employer] of Occupational Health - Occupational Stress Questionnaire    Feeling of Stress : Not at all  Social Connections: Socially Isolated (08/03/2022)   Social Connection and Isolation Panel [NHANES]    Frequency of Communication with Friends and Family: More than three times a week    Frequency of Social Gatherings with Friends and Family: More than three times a week    Attends Religious Services: Never    Database administrator or Organizations: No    Attends Banker Meetings: Never    Marital Status: Widowed    Tobacco Counseling Ready to quit: Not Answered Counseling given: Not Answered   Clinical Intake:  Pre-visit preparation completed: No  Pain : 0-10 Pain Score: 6  Pain Location: Back Pain Orientation: Lower Pain Descriptors / Indicators: Constant, Throbbing Pain Onset: More than a month ago Pain Frequency: Constant Pain Relieving Factors: Rx pain medication  Pain Relieving Factors: Rx pain medication  Nutritional Status: BMI > 30  Obese Nutritional Risks: None Diabetes: Yes CBG done?: No Did pt. bring in CBG monitor from home?: No  How often do you need to have someone help you when you read instructions, pamphlets, or other written materials from your doctor or pharmacy?: 1 - Never  Interpreter Needed?: No      Activities of Daily Living    05/11/2023    10:36 AM 10/13/2022    9:50 AM  In your present state of health, do you have any difficulty performing the following activities:  Hearing? 0 0  Vision? 0 0  Difficulty concentrating or making decisions? 0 0  Walking or climbing stairs? 1 0  Comment uses a cane   Dressing or bathing? 0 0  Doing errands, shopping? 0 0  Preparing Food and eating ? N   Using the Toilet? N   In the past six months, have you accidently leaked urine? N   Do you have problems with loss of bowel control? N   Managing your Medications? N   Managing your Finances? N   Housekeeping or managing your Housekeeping? N   Comment Sometimes daughter will help     Patient Care Team: Blane Ohara, MD as PCP - General (Family Medicine) Zettie Pho, [facility] (Inactive) as Pharmacist (Pharmacist) [name], Aundra Dubin, MD as Consulting Physician (Cardiology) Freddie Breech, DPM as Consulting Physician (Podiatry)  Indicate any recent Medical Services you may have received from other than [facility] providers in the past year (date may be approximate).     Assessment:   This is a routine wellness examination for Evian.  Hearing/Vision screen No results found.   Goals Addressed   None   Depression Screen    05/11/2023   10:25 AM 02/01/2023   10:33 AM 11/10/2022   10:57 AM 10/13/2022    9:49 AM 07/24/2022    1:39 PM 07/09/2022    9:20 AM 06/24/2022    2:16 PM  PHQ 2/9 Scores  PHQ - 2 Score 2 4 2  0 0 0 0  PHQ- 9 Score 4 12 10   0 0 4  Fall Risk    05/11/2023   10:24 AM 02/01/2023   10:14 AM 11/10/2022   10:57 AM 08/31/2022    2:21 PM 08/03/2022    3:17 PM  Fall Risk   Falls in the past year? 1 1 1 1 1   Number falls in past yr: 1 1 1 1 1   Injury with Fall? 0 1 1 1 1   Risk for fall due to : Impaired balance/gait Impaired balance/gait Impaired mobility;Impaired balance/gait History of fall(s);Impaired balance/gait;Impaired mobility;Other (Comment) History of fall(s);Impaired balance/gait  Risk for fall due to: Comment     last fall with injury on 08-22-2022   Follow up Falls evaluation completed Falls evaluation completed Falls evaluation completed;Falls prevention discussed Falls evaluation completed;Education provided;Falls prevention discussed Falls evaluation completed;Education provided;Falls prevention discussed    MEDICARE RISK AT HOME: Medicare Risk at Home Any stairs in or around the home?: No If so, are there any without handrails?: No Home free of loose throw rugs in walkways, pet beds, electrical cords, etc?: Yes Adequate lighting in your home to reduce risk of falls?: Yes Life alert?: No Use of a cane, walker or w/c?: Yes Grab bars in the bathroom?: Yes Shower chair or bench in shower?: No Elevated toilet seat or a handicapped toilet?: Yes  TIMED UP AND GO:  Was the test performed?  Yes  Length of time to ambulate 10 feet:  sec Gait slow and steady with assistive device    Cognitive Function:        02/10/2022    2:26 PM 06/03/2020    9:53 AM  6CIT Screen  What Year? 0 points 0 points  What month? 0 points 0 points  What time? 0 points 0 points  Count back from 20 0 points 0 points  Months in reverse 0 points 0 points  Repeat phrase 0 points 0 points  Total Score 0 points 0 points    Immunizations Immunization History  Administered Date(s) Administered   Fluad Trivalent(High Dose 65+) 05/11/2023   Influenza Inj Mdck Quad Pf 06/03/2020, 05/27/2021, 03/30/2022   Influenza-Unspecified 05/07/2019   PFIZER(Purple Top)SARS-COV-2 Vaccination 10/26/2019, 11/24/2019, 07/13/2020   Pfizer(Comirnaty)Fall Seasonal Vaccine 12 years and older 07/09/2022, 05/11/2023   Pneumococcal Polysaccharide-23 08/06/2013, 04/06/2019   Pneumococcal-Unspecified 05/07/2019   Zoster Recombinant(Shingrix) 05/07/2019      Screening Tests Health Maintenance  Topic Date Due   DTaP/Tdap/Td (1 - Tdap) Never done   Zoster Vaccines- Shingrix (2 of 2) 07/02/2019   OPHTHALMOLOGY EXAM  11/12/2021   Lung  Cancer Screening  02/19/2023   COVID-19 Vaccine (6 - 2023-24 season) 07/06/2023   HEMOGLOBIN A1C  08/04/2023   FOOT EXAM  10/13/2023   Diabetic kidney evaluation - eGFR measurement  02/01/2024   Diabetic kidney evaluation - Urine ACR  02/01/2024   Medicare Annual Wellness (AWV)  05/10/2024   Colonoscopy  07/06/2024   INFLUENZA VACCINE  Completed   Hepatitis C Screening  Completed   HIV Screening  Completed   HPV VACCINES  Aged Out    Health Maintenance  Health Maintenance Due  Topic Date Due   DTaP/Tdap/Td (1 - Tdap) Never done   Zoster Vaccines- Shingrix (2 of 2) 07/02/2019   OPHTHALMOLOGY EXAM  11/12/2021   Lung Cancer Screening  02/19/2023    Additional Screening:  Vision Screening: Recommended annual ophthalmology exams for early detection of glaucoma and other disorders of the eye. Is the patient up to date with their annual eye exam?  No  Last saw them 3  years ago.  Who is the provider or what is the name of the office in which the patient attends annual eye exams? Plans to schedule appt If pt is not established with a provider, would they like to be referred to a provider to establish care? No .   Dental Screening: Recommended annual dental exams for proper oral hygiene  Diabetic Foot Exam: See's podiatry  Community Resource Referral / Chronic Care Management: CRR required this visit?  No   CCM required this visit?  No     Plan:     I have personally reviewed and noted the following in the patient's chart:   Medical and social history Use of alcohol, tobacco or illicit drugs  Current medications and supplements including opioid prescriptions. Patient is currently taking opioid prescriptions. Information provided to patient regarding non-opioid alternatives. Patient advised to discuss non-opioid treatment plan with their provider. Functional ability and status Nutritional status Physical activity Advanced directives List of other  physicians Hospitalizations, surgeries, and ER visits in previous 12 months Vitals Screenings to include cognitive, depression, and falls Referrals and appointments  In addition, I have reviewed and discussed with patient certain preventive protocols, quality metrics, and best practice recommendations. A written personalized care plan for preventive services as well as general preventive health recommendations were provided to patient.    I attest that I have reviewed this visit and agree with the plan scribed by my staff.   Blane Ohara, MD [facility] [phone]

## 2023-05-11 NOTE — Patient Instructions (Signed)
Recommend go to pharmacy to get tetanus (tdap) and Shingrix vaccines.

## 2023-05-12 ENCOUNTER — Telehealth: Payer: Self-pay

## 2023-05-12 ENCOUNTER — Encounter: Payer: Self-pay | Admitting: Podiatry

## 2023-05-12 ENCOUNTER — Ambulatory Visit (INDEPENDENT_AMBULATORY_CARE_PROVIDER_SITE_OTHER): Payer: 59 | Admitting: Podiatry

## 2023-05-12 DIAGNOSIS — L84 Corns and callosities: Secondary | ICD-10-CM | POA: Diagnosis not present

## 2023-05-12 DIAGNOSIS — M79675 Pain in left toe(s): Secondary | ICD-10-CM | POA: Diagnosis not present

## 2023-05-12 DIAGNOSIS — E1151 Type 2 diabetes mellitus with diabetic peripheral angiopathy without gangrene: Secondary | ICD-10-CM

## 2023-05-12 DIAGNOSIS — M79674 Pain in right toe(s): Secondary | ICD-10-CM | POA: Diagnosis not present

## 2023-05-12 DIAGNOSIS — B351 Tinea unguium: Secondary | ICD-10-CM

## 2023-05-12 LAB — CBC WITH DIFFERENTIAL/PLATELET
Basophils Absolute: 0.1 10*3/uL (ref 0.0–0.2)
Basos: 1 %
EOS (ABSOLUTE): 0.1 10*3/uL (ref 0.0–0.4)
Eos: 1 %
Hematocrit: 49.1 % (ref 37.5–51.0)
Hemoglobin: 16.1 g/dL (ref 13.0–17.7)
Immature Grans (Abs): 0.1 10*3/uL (ref 0.0–0.1)
Immature Granulocytes: 1 %
Lymphocytes Absolute: 2.5 10*3/uL (ref 0.7–3.1)
Lymphs: 23 %
MCH: 30.8 pg (ref 26.6–33.0)
MCHC: 32.8 g/dL (ref 31.5–35.7)
MCV: 94 fL (ref 79–97)
Monocytes Absolute: 0.6 10*3/uL (ref 0.1–0.9)
Monocytes: 6 %
Neutrophils Absolute: 7.5 10*3/uL — ABNORMAL HIGH (ref 1.4–7.0)
Neutrophils: 68 %
Platelets: 395 10*3/uL (ref 150–450)
RBC: 5.23 x10E6/uL (ref 4.14–5.80)
RDW: 13.1 % (ref 11.6–15.4)
WBC: 10.9 10*3/uL — ABNORMAL HIGH (ref 3.4–10.8)

## 2023-05-12 LAB — COMPREHENSIVE METABOLIC PANEL
ALT: 10 [IU]/L (ref 0–44)
AST: 12 [IU]/L (ref 0–40)
Albumin: 4.3 g/dL (ref 3.9–4.9)
Alkaline Phosphatase: 79 [IU]/L (ref 44–121)
BUN/Creatinine Ratio: 7 — ABNORMAL LOW (ref 10–24)
BUN: 9 mg/dL (ref 8–27)
Bilirubin Total: 0.5 mg/dL (ref 0.0–1.2)
CO2: 23 mmol/L (ref 20–29)
Calcium: 9.9 mg/dL (ref 8.6–10.2)
Chloride: 98 mmol/L (ref 96–106)
Creatinine, Ser: 1.27 mg/dL (ref 0.76–1.27)
Globulin, Total: 2.4 g/dL (ref 1.5–4.5)
Glucose: 170 mg/dL — ABNORMAL HIGH (ref 70–99)
Potassium: 5.1 mmol/L (ref 3.5–5.2)
Sodium: 138 mmol/L (ref 134–144)
Total Protein: 6.7 g/dL (ref 6.0–8.5)
eGFR: 63 mL/min/{1.73_m2} (ref >59)

## 2023-05-12 LAB — LIPID PANEL
Chol/HDL Ratio: 4.7 ratio (ref 0.0–5.0)
Cholesterol, Total: 191 mg/dL (ref 100–199)
HDL: 41 mg/dL (ref >39)
LDL Chol Calc (NIH): 118 mg/dL — ABNORMAL HIGH (ref 0–99)
Triglycerides: 183 mg/dL — ABNORMAL HIGH (ref 0–149)
VLDL Cholesterol Cal: 32 mg/dL (ref 5–40)

## 2023-05-12 LAB — HEMOGLOBIN A1C
Est. average glucose Bld gHb Est-mCnc: 217 mg/dL
Hgb A1c MFr Bld: 9.2 % — ABNORMAL HIGH (ref 4.8–5.6)

## 2023-05-12 NOTE — Progress Notes (Signed)
Subjective:  Patient ID: Brent Ortiz, male    DOB: 24-Jul-1960,  MRN: [id number]  Brent Ortiz presents to clinic today for:  Chief Complaint  Patient presents with   [facility]    Nail Trim. Calluses left foot. Blood sugars 140s. Smokes about 1 pack a day   Patient notes nails are thick and elongated, causing pain in shoe gear when ambulating.  There's a callus on plantar left foot  PCP is [name], Fritzi Mandes, MD.  Last seen 05/11/23  Past Medical History:  Diagnosis Date   Acute combined systolic and diastolic CHF, NYHA class 2 (HCC) 01/22/2020   Acute gout of left elbow 02/14/2022   Acute idiopathic gout of right wrist 12/29/2021   Alcohol abuse    Atherosclerosis of coronary artery of native heart with stable angina pectoris, unspecified vessel or lesion type (HCC) 01/23/2016   Overview:  Completely occluded circumflex artery proximal to obtuse marginal 1 basin cardiac catheter from 2017   Atherosclerosis of native coronary artery of native heart without angina pectoris 01/23/2016   Overview:  Completely occluded circumflex artery proximal to obtuse marginal 1 basin cardiac catheter from 2017   Bilateral hip pain 11/17/2021   BMI 45.0-49.9, adult (HCC) 01/13/2013   Cellulitis of right upper extremity 01/01/2022   Centrilobular emphysema (HCC) 01/16/2022   Chronic kidney disease    Chronic pain 10/19/2019   Sees pain clinic   COPD (chronic obstructive pulmonary disease) (HCC)    COPD exacerbation (HCC) 10/03/2019   Coronary artery disease involving native heart 02/08/2020   Coronary atherosclerosis of native coronary artery 01/23/2016   Overview:  Completely occluded circumflex artery proximal to obtuse marginal 1 basin cardiac catheter from 2017   Depression, major, single episode, moderate (HCC) 07/11/2020   Diabetic polyneuropathy (HCC) 12/06/2019   ED (erectile dysfunction) 12/06/2019   Essential hypertension 01/23/2016   Essential thrombocythemia (HCC) 09/29/2011   GERD  (gastroesophageal reflux disease) 11/17/2021   GERD without esophagitis    HFrEF (heart failure with reduced ejection fraction) (HCC) 02/08/2020   Knee pain, left 02/12/2012   Formatting of this note might be different from the original. Left   Leukocytosis 09/04/2011   Lumbar pain 11/17/2021   Mild major depression, single episode (HCC) 02/14/2022   Mixed hyperlipidemia 06/18/2017   Mixed incontinence 02/24/2022   Morbid obesity (HCC) 11/21/2020   Morbid obesity with BMI of 40.0-44.9, adult (HCC) 01/13/2013   Myocardial infarction (HCC)    04-13-2011   Obstructive chronic bronchitis with exacerbation (HCC) 10/03/2019   Obstructive sleep apnea 07/18/2011   Olecranon bursitis of left elbow 02/14/2022   Olecranon bursitis of left elbow 02/14/2022   Opiate abuse, continuous (HCC)    Osteoarthritis    Pain in knee joint 02/12/2012   Formatting of this note might be different from the original. Left   Personal history of stroke with residual effects 07/24/2021   Pneumonia    Polysubstance abuse (HCC)    Poor dentition    Stage 3b chronic kidney disease (HCC) 12/27/2020   Thrombocytosis 09/29/2011   Tobacco dependence 01/23/2016   Tobacco use disorder 01/23/2016   Type 2 diabetes mellitus with diabetic polyneuropathy (HCC)    Allergies  Allergen Reactions   Motrin [Ibuprofen] Other (See Comments)    Reaction: ulcers   Buprenorphine Itching    Itching and dizziness   Other Nausea And Vomiting    Patient has ulcers   Objective:  Brent Ortiz is a pleasant 62 y.o. male in  NAD. AAO x 3.  Vascular Examination: Patient has palpable DP pulse, absent PT pulse bilateral.  Delayed capillary refill bilateral toes.  Sparse digital hair bilateral.  Proximal to distal cooling WNL bilateral.    Dermatological Examination: Interspaces are clear with no open lesions noted bilateral.  Skin is shiny and atrophic bilateral.  Nails are 3-39mm thick, with yellowish/brown discoloration, subungual debris  and distal onycholysis x10.  There is pain with compression of nails x10.  There are hyperkeratotic lesions noted on plantarcentral right heel, just anterior to heel fat pad .     Latest Ref Rng & Units 05/11/2023   11:18 AM 02/01/2023   11:03 AM 10/13/2022   10:18 AM 07/09/2022    9:59 AM  Hemoglobin A1C  Hemoglobin-A1c 4.8 - 5.6 % 9.2  8.0  8.0  7.5    Patient qualifies for at-risk foot care because of diabetes with PVD .  Assessment/Plan: 1. Pain due to onychomycosis of toenails of both feet   2. Callus of foot   3. Type II diabetes mellitus with peripheral circulatory disorder (HCC)     Mycotic nails x10 were sharply debrided with sterile nail nippers and power debriding burr to decrease bulk and length.  Hyperkeratotic lesions x1 was shaved with #312 blade.  The debris in his interspaces was wiped away with alcohol-dampened gauze  Rx diabetic shoe consult.    Return in about 3 months (around 08/12/2023) for [facility].   Clerance Lav, DPM, FACFAS [facility]     [address] [place], [place] [address]                Office [phone]  Fax [phone]

## 2023-05-12 NOTE — Progress Notes (Signed)
   Care Guide Note  05/12/2023 Name: Brent Ortiz MRN: [id number] DOB: [birth year]  Referred by: Blane Ohara, MD Reason for referral : Care Coordination (Outreach to schedule with Pharm d )   Brent Ortiz is a 62 y.o. year old male who is a primary care patient of [name], Fritzi Mandes, MD. [name] was referred to the pharmacist for assistance related to HTN and DM.    An unsuccessful telephone outreach was attempted today to contact the patient who was referred to the pharmacy team for assistance with medication management. Additional attempts will be made to contact the patient.   Penne Lash, RMA Care Guide [facility]  [place], [place] [address] Direct Dial: [phone] [name]

## 2023-05-13 LAB — PAIN MGT SCRN (14 DRUGS), UR
Amphetamine Scrn, Ur: NEGATIVE ng/mL
BARBITURATE SCREEN URINE: NEGATIVE ng/mL
BENZODIAZEPINE SCREEN, URINE: NEGATIVE ng/mL
Buprenorphine, Urine: NEGATIVE ng/mL
CANNABINOIDS UR QL SCN: NEGATIVE ng/mL
Cocaine (Metab) Scrn, Ur: NEGATIVE ng/mL
Creatinine(Crt), U: 150.3 mg/dL (ref 20.0–300.0)
Fentanyl, Urine: NEGATIVE pg/mL
Meperidine Screen, Urine: NEGATIVE ng/mL
Methadone Screen, Urine: NEGATIVE ng/mL
OXYCODONE+OXYMORPHONE UR QL SCN: POSITIVE ng/mL — AB
Opiate Scrn, Ur: POSITIVE ng/mL — AB
Ph of Urine: 5 (ref 4.5–8.9)
Phencyclidine Qn, Ur: NEGATIVE ng/mL
Propoxyphene Scrn, Ur: NEGATIVE ng/mL
Tramadol Screen, Urine: NEGATIVE ng/mL

## 2023-05-15 DIAGNOSIS — Z23 Encounter for immunization: Secondary | ICD-10-CM | POA: Insufficient documentation

## 2023-05-15 DIAGNOSIS — Z0000 Encounter for general adult medical examination without abnormal findings: Secondary | ICD-10-CM | POA: Insufficient documentation

## 2023-05-15 DIAGNOSIS — J410 Simple chronic bronchitis: Secondary | ICD-10-CM

## 2023-05-15 HISTORY — DX: Simple chronic bronchitis: J41.0

## 2023-05-15 NOTE — Assessment & Plan Note (Signed)
Well controlled. Recommend check sugars fasting daily. Recommend check feet daily. Recommend annual eye exams. Continue to work on eating a healthy diet and exercise.  Labs drawn today.     Continue Lantus 30 units daily, ozempic 1 mg weekly, xigduo 5-500 mg daily

## 2023-05-15 NOTE — Assessment & Plan Note (Signed)
Sent Trelegy inhaler.

## 2023-05-15 NOTE — Assessment & Plan Note (Signed)
-   Check UDS

## 2023-05-15 NOTE — Assessment & Plan Note (Signed)

## 2023-05-15 NOTE — Assessment & Plan Note (Signed)
The current medical regimen is effective;  continue present plan and medications.  

## 2023-05-17 NOTE — Progress Notes (Signed)
   Care Guide Note  05/17/2023 Name: MAURIO COBERLEY MRN: [id number] DOB: 1961-04-06  Referred by: Blane Ohara, MD Reason for referral : Care Coordination (Outreach to schedule with Pharm d )   NASRI CASUCCI is a 62 y.o. year old male who is a primary care patient of [name], Fritzi Mandes, MD. Giovonni Zaugg Cofer was referred to the pharmacist for assistance related to HTN and DM.    A second unsuccessful telephone outreach was attempted today to contact the patient who was referred to the pharmacy team for assistance with medication assistance. Additional attempts will be made to contact the patient.  Penne Lash, RMA Care Guide [facility]  [place], [place] [address] Direct Dial: [phone] [email or web address]

## 2023-05-21 ENCOUNTER — Other Ambulatory Visit: Payer: Self-pay | Admitting: Family Medicine

## 2023-05-21 DIAGNOSIS — I2510 Atherosclerotic heart disease of native coronary artery without angina pectoris: Secondary | ICD-10-CM

## 2023-05-24 NOTE — Progress Notes (Signed)
   Care Guide Note  05/24/2023 Name: Brent Ortiz MRN: [id number] DOB: 01-Oct-1960  Referred by: Blane Ohara, MD Reason for referral : Care Coordination (Outreach to schedule with Pharm d )   Brent Ortiz is a 62 y.o. year old male who is a primary care patient of [name], Fritzi Mandes, MD. Brent Ortiz was referred to the pharmacist for assistance related to HTN and DM.    A third unsuccessful telephone outreach was attempted today to contact the patient who was referred to the pharmacy team for assistance with medication management. The Population Health team is pleased to engage with this patient at any time in the future upon receipt of referral and should he/she be interested in assistance from the [employer] [facility] team.    Brent Ortiz , RMA     [facility]  [facility], [facility] Guide  Direct Dial: [phone]  Website: [email or web address]

## 2023-05-25 ENCOUNTER — Ambulatory Visit: Payer: 59

## 2023-05-25 ENCOUNTER — Other Ambulatory Visit: Payer: Self-pay

## 2023-05-25 DIAGNOSIS — E1151 Type 2 diabetes mellitus with diabetic peripheral angiopathy without gangrene: Secondary | ICD-10-CM

## 2023-05-25 DIAGNOSIS — M2041 Other hammer toe(s) (acquired), right foot: Secondary | ICD-10-CM

## 2023-05-25 DIAGNOSIS — L84 Corns and callosities: Secondary | ICD-10-CM

## 2023-05-25 DIAGNOSIS — E1142 Type 2 diabetes mellitus with diabetic polyneuropathy: Secondary | ICD-10-CM

## 2023-05-25 DIAGNOSIS — M545 Low back pain, unspecified: Secondary | ICD-10-CM

## 2023-05-25 MED ORDER — OXYCODONE-ACETAMINOPHEN 5-325 MG PO TABS: ORAL_TABLET | ORAL | 0 refills | Status: DC

## 2023-05-25 NOTE — Progress Notes (Signed)
Patient presents to the office today for diabetic shoe and insole measuring.  Patient was measured with brannock device to determine size and width for 1 pair of extra depth shoes and foam casted for 3 pair of insoles.   Documentation of medical necessity will be sent to patient's treating diabetic doctor to verify and sign.   Patient's diabetic provider: Janith Lima   Shoes and insoles will be ordered at that time and patient will be notified for an appointment for fitting when they arrive.   Shoe size (per patient): 11.5 Brannock measurement: 11 Patient shoe selection- Shoe choice:   V952M / [id number] Shoe size ordered: 11.5WD [employer] Signed

## 2023-05-28 ENCOUNTER — Other Ambulatory Visit: Payer: Self-pay

## 2023-06-16 ENCOUNTER — Telehealth: Payer: Self-pay

## 2023-06-16 NOTE — Telephone Encounter (Signed)
Left VM to schedule a diabetic shoe p/u appt

## 2023-06-22 ENCOUNTER — Other Ambulatory Visit: Payer: Self-pay | Admitting: Family Medicine

## 2023-07-02 ENCOUNTER — Other Ambulatory Visit: Payer: Self-pay

## 2023-07-02 ENCOUNTER — Telehealth: Payer: Self-pay

## 2023-07-02 DIAGNOSIS — M545 Low back pain, unspecified: Secondary | ICD-10-CM

## 2023-07-02 MED ORDER — OXYCODONE-ACETAMINOPHEN 5-325 MG PO TABS: ORAL_TABLET | ORAL | 0 refills | Status: DC

## 2023-07-02 NOTE — Telephone Encounter (Signed)
Called pt to schedule diabetic shoe pick up and his number is out of service.

## 2023-07-02 NOTE — Telephone Encounter (Signed)
Copied from CRM [id number]. Topic: Clinical - Medication Refill >> Jul 02, 2023  9:41 AM Joanette Gula wrote: Most Recent Primary Care Visit:  Provider: [name], KIRSTEN  Department: [name]  Visit Type: OFFICE VISIT  Date: 05/11/2023  Medication: oxyCODONE-acetaminophen (PERCOCET/ROXICET) 5-325 MG tablet  Has the patient contacted their pharmacy? Yes (Agent: If no, request that the patient contact the pharmacy for the refill. If patient does not wish to contact the pharmacy document the reason why and proceed with request.) (Agent: If yes, when and what did the pharmacy advise?)  Is this the correct pharmacy for this prescription? Yes If no, delete pharmacy and type the correct one.  This is the patient's preferred pharmacy:   [facility], [place] - [address] [place] [address] Phone: [phone] Fax: [phone]  [facility] - [address] [place] - [address] [place] [address] Phone: [phone] Fax: [phone]   Has the prescription been filled recently? Yes  Is the patient out of the medication? Yes  Has the patient been seen for an appointment in the last year OR does the patient have an upcoming appointment? Yes  Can we respond through MyChart? No  Agent: Please be advised that Rx refills may take up to 3 business days. We ask that you follow-up with your pharmacy.

## 2023-07-22 ENCOUNTER — Telehealth: Payer: Self-pay | Admitting: Urology

## 2023-07-22 NOTE — Telephone Encounter (Signed)
Pt called wanting to know update on Diabetic shoes, told him I would message that department and call him back, I verified best number to call back. I tried to call pt back but number is out of service and I also called the daughter with no answer. Pts Diabetic shoes are in he just needs to be scheduled an appt.

## 2023-07-23 ENCOUNTER — Other Ambulatory Visit: Payer: Self-pay | Admitting: Family Medicine

## 2023-07-23 DIAGNOSIS — I25118 Atherosclerotic heart disease of native coronary artery with other forms of angina pectoris: Secondary | ICD-10-CM

## 2023-08-02 ENCOUNTER — Other Ambulatory Visit: Payer: Self-pay | Admitting: Family Medicine

## 2023-08-02 DIAGNOSIS — M545 Low back pain, unspecified: Secondary | ICD-10-CM

## 2023-08-02 MED ORDER — OXYCODONE-ACETAMINOPHEN 5-325 MG PO TABS: ORAL_TABLET | ORAL | 0 refills | Status: DC

## 2023-08-02 NOTE — Telephone Encounter (Signed)
Copied from CRM [id number]. Topic: Clinical - Medication Refill >> Aug 02, 2023  9:15 AM Dennison Nancy wrote: Most Recent Primary Care Visit:  Provider: [name], KIRSTEN  Department: [name]  Visit Type: OFFICE VISIT  Date: 05/11/2023  Medication: oxyCODONE-acetaminophen (PERCOCET/ROXICET) 5-325 MG tablet   Has the patient contacted their pharmacy? Yes (Agent: If no, request that the patient contact the pharmacy for the refill. If patient does not wish to contact the pharmacy document the reason why and proceed with request.) (Agent: If yes, when and what did the pharmacy advise?)  Is this the correct pharmacy for this prescription? Yes If no, delete pharmacy and type the correct one.  This is the patient's preferred pharmacy:   [place], [place] - [address] [place] [address] Phone: [phone] Fax: [phone]  [facility] - [address] [place] - [address] [place] [address] Phone: [phone] Fax: [phone]   Has the prescription been filled recently? No  Is the patient out of the medication? No  Has the patient been seen for an appointment in the last year OR does the patient have an upcoming appointment? yes  Can we respond through MyChart? Yes  Agent: Please be advised that Rx refills may take up to 3 business days. We ask that you follow-up with your pharmacy.

## 2023-08-05 NOTE — Telephone Encounter (Signed)
Copied from CRM [id number]. Topic: Clinical - Medication Question >> Aug 04, 2023  4:44 PM Hector Shade B wrote: Reason for CRM: Patient called to check to see if the prescription for pain medication had been filled patient states [facility] advised him to call and get his meds refilled however the pharmacy had not been updated until patient called back in a few minutes ago and requested to leave it as is for now.

## 2023-08-12 ENCOUNTER — Ambulatory Visit (INDEPENDENT_AMBULATORY_CARE_PROVIDER_SITE_OTHER): Payer: 59 | Admitting: Podiatry

## 2023-08-12 DIAGNOSIS — M79674 Pain in right toe(s): Secondary | ICD-10-CM | POA: Diagnosis not present

## 2023-08-12 DIAGNOSIS — B351 Tinea unguium: Secondary | ICD-10-CM

## 2023-08-12 DIAGNOSIS — M79675 Pain in left toe(s): Secondary | ICD-10-CM

## 2023-08-12 DIAGNOSIS — B353 Tinea pedis: Secondary | ICD-10-CM | POA: Diagnosis not present

## 2023-08-12 MED ORDER — KETOCONAZOLE 2 % EX CREA
1.0000 | TOPICAL_CREAM | Freq: Every day | CUTANEOUS | 2 refills | Status: AC
Start: 2023-08-12 — End: ?

## 2023-08-12 NOTE — Progress Notes (Signed)
 Subjective:  Patient ID: Brent Ortiz, male    DOB: Nov 28, 1960,  MRN: [id number]  Brent Ortiz presents to clinic today for:  Chief Complaint  Patient presents with   [place]    [facility] with possible callous/cor on the arch of right. Last A1c in Nov was 9.2, He sees his PCP on the 12th and he takes Plavix    Patient notes nails are thick and elongated, causing pain in shoe gear when ambulating.  He also has peeling, dry skin on plantar foot bilateral.  PCP is [name], Abigail, MD.   Past Medical History:  Diagnosis Date   Acute combined systolic and diastolic CHF, NYHA class 2 (HCC) 01/22/2020   Acute gout of left elbow 02/14/2022   Acute idiopathic gout of right wrist 12/29/2021   Alcohol  abuse    Atherosclerosis of coronary artery of native heart with stable angina pectoris, unspecified vessel or lesion type (HCC) 01/23/2016   Overview:  Completely occluded circumflex artery proximal to obtuse marginal 1 basin cardiac catheter from 2017   Atherosclerosis of native coronary artery of native heart without angina pectoris 01/23/2016   Overview:  Completely occluded circumflex artery proximal to obtuse marginal 1 basin cardiac catheter from 2017   Bilateral hip pain 11/17/2021   BMI 45.0-49.9, adult (HCC) 01/13/2013   Cellulitis of right upper extremity 01/01/2022   Centrilobular emphysema (HCC) 01/16/2022   Chronic kidney disease    Chronic pain 10/19/2019   Sees pain clinic   COPD (chronic obstructive pulmonary disease) (HCC)    COPD exacerbation (HCC) 10/03/2019   Coronary artery disease involving native heart 02/08/2020   Coronary atherosclerosis of native coronary artery 01/23/2016   Overview:  Completely occluded circumflex artery proximal to obtuse marginal 1 basin cardiac catheter from 2017   Depression, major, single episode, moderate (HCC) 07/11/2020   Diabetic polyneuropathy (HCC) 12/06/2019   ED (erectile dysfunction) 12/06/2019   Essential hypertension 01/23/2016   Essential  thrombocythemia (HCC) 09/29/2011   GERD (gastroesophageal reflux disease) 11/17/2021   GERD without esophagitis    HFrEF (heart failure with reduced ejection fraction) (HCC) 02/08/2020   Knee pain, left 02/12/2012   Formatting of this note might be different from the original. Left   Leukocytosis 09/04/2011   Lumbar pain 11/17/2021   Mild major depression, single episode (HCC) 02/14/2022   Mixed hyperlipidemia 06/18/2017   Mixed incontinence 02/24/2022   Morbid obesity (HCC) 11/21/2020   Morbid obesity with BMI of 40.0-44.9, adult (HCC) 01/13/2013   Myocardial infarction (HCC)    04-13-2011   Obstructive chronic bronchitis with exacerbation (HCC) 10/03/2019   Obstructive sleep apnea 07/18/2011   Olecranon bursitis of left elbow 02/14/2022   Olecranon bursitis of left elbow 02/14/2022   Opiate abuse, continuous (HCC)    Osteoarthritis    Pain in knee joint 02/12/2012   Formatting of this note might be different from the original. Left   Personal history of stroke with residual effects 07/24/2021   Pneumonia    Polysubstance abuse (HCC)    Poor dentition    Stage 3b chronic kidney disease (HCC) 12/27/2020   Thrombocytosis 09/29/2011   Tobacco dependence 01/23/2016   Tobacco use disorder 01/23/2016   Type 2 diabetes mellitus with diabetic polyneuropathy (HCC)    Allergies  Allergen Reactions   Motrin  [Ibuprofen ] Other (See Comments)    Reaction: ulcers   Buprenorphine  Itching    Itching and dizziness   Other Nausea And Vomiting    Patient has ulcers  Objective:  Brent Ortiz is a pleasant 63 y.o. male in NAD. AAO x 3.  Vascular Examination: Patient has palpable DP pulse, absent PT pulse bilateral.  Delayed capillary refill bilateral toes.  Sparse digital hair bilateral.  Proximal to distal cooling WNL bilateral.    Dermatological Examination: Interspaces are clear with no open lesions noted bilateral.  Skin is shiny and atrophic bilateral.  Nails are 3-29mm thick, with  yellowish/brown discoloration, subungual debris and distal onycholysis x10.  There is pain with compression of nails x10.  There is dry skin, peeling diffusely noted on plantar bilateral foot.     Latest Ref Rng & Units 05/11/2023   11:18 AM 02/01/2023   11:03 AM 10/13/2022   10:18 AM  Hemoglobin A1C  Hemoglobin-A1c 4.8 - 5.6 % 9.2  8.0  8.0    Patient qualifies for at-risk foot care because of diabetes with PVD .  Assessment/Plan: 1. Pain due to onychomycosis of toenails of both feet   2. Tinea pedis of both feet     Mycotic nails x10 were sharply debrided with sterile nail nippers and power debriding burr to decrease bulk and length.  Rx ketoconazole  2% cream every day to plantar foot bilateral.  Return in about 3 months (around 11/09/2023) for [facility].   Brent Ortiz, DPM, FACFAS [facility]     [address] [place], [place] [address]                Office [phone]  Fax [phone]

## 2023-08-17 NOTE — Progress Notes (Addendum)
 Subjective:  Patient ID: Brent Ortiz, male    DOB: 11-25-1960  Age: 63 y.o. MRN: [id number]  Chief Complaint  Patient presents with   Medical Management of Chronic Issues    HPI The patient, with a history of diabetes, presents with poorly controlled blood sugars. His last A1c was 9.2, which he attributes to drinking regular soda. He expresses a preference for diet soda and plans to instruct his grandson to purchase diet soda for him in the future. He is currently on Lantus insulin (30 units daily) and Ozempic (taken every Friday). He reports some confusion about the dosage of Ozempic due to the lack of numbers on the needle. He also reports only eating one meal a day, stating he does not feel hungry. He denies experiencing low blood sugars.  In addition to diabetes, the patient has a history of back pain, which he rates as a 4 or 5 out of 10 on a daily basis. The pain sometimes radiates down his left side from his buttocks to his leg. He reports difficulty walking more than 20-25 feet due to the pain. He is currently taking oxycodone for the pain.  The patient also reports taking Zoloft for mood, which he feels is effective. He denies feeling anxious, depressed, sad, or irritable. He also takes trazodone for sleep, which he reports is effective.  The patient has a history of gout, but denies any recent flares. He is currently taking allopurinol. He also has a history of constipation, but denies current issues and is not taking any medications for it.  The patient has a history of acid reflux and is currently taking Prevacid and famotidine. He reports no current issues with acid reflux.  The patient also has a history of high cholesterol, which was slightly elevated at his last visit. He is currently taking rosuvastatin and Zetia.  The patient is also on spironolactone, metoprolol, and amlodipine for blood pressure control. He reports no issues with his blood pressure.  The patient  reports checking his feet most days and is awaiting the arrival of his diabetic shoes. He also reports being on gabapentin, but it is unclear what this is being used for.    CONGESTIVE HEART FAILURE/CAD: He takes Entresto 24-26 mg twice a day, Isosorbide 60 mg daily, Spironolactone 25 mg daily, Ranexa 500 mg twice daily, metoprolol XL 25 mg daily. Cardiologist.    Gout:  Allopurinol 100 mg daily. Colchicine as needed.    Insomnia: on trazodone 150 mg before bed. Works Firefighter.        08/18/2023   10:20 AM 05/11/2023   10:25 AM 02/01/2023   10:33 AM 11/10/2022   10:57 AM 10/13/2022    9:49 AM  Depression screen PHQ 2/9  Decreased Interest 0 1 3 1  0  Down, Depressed, Hopeless 0 1 1 1  0  PHQ - 2 Score 0 2 4 2  0  Altered sleeping 0 0 0 1   Tired, decreased energy 0 0 2 1   Change in appetite 0 0 2 2   Feeling bad or failure about yourself  0 1 2 2    Trouble concentrating 0 0 1 1   Moving slowly or fidgety/restless 0 1 1 1    Suicidal thoughts 0 0 0 0   PHQ-9 Score 0 4 12 10    Difficult doing work/chores Not difficult at all Not difficult at all Somewhat difficult Somewhat difficult         05/11/2023   10:24 AM  Fall Risk   Falls in the past year? 1  Number falls in past yr: 1  Injury with Fall? 0  Risk for fall due to : Impaired balance/gait  Follow up Falls evaluation completed    Patient Care Team: Blane Ohara, MD as PCP - General (Family Medicine) Zettie Pho, [facility] (Inactive) as Pharmacist (Pharmacist) [name], Aundra Dubin, MD as Consulting Physician (Cardiology) Freddie Breech, DPM as Consulting Physician (Podiatry)   Review of Systems  Constitutional:  Negative for chills, diaphoresis, fatigue and fever.  HENT:  Negative for congestion, ear pain and sore throat.   Respiratory:  Negative for cough and shortness of breath.   Cardiovascular:  Negative for chest pain and leg swelling.  Gastrointestinal:  Negative for abdominal pain, constipation, diarrhea, nausea and  vomiting.  Genitourinary:  Negative for dysuria and urgency.  Musculoskeletal:  Positive for back pain. Negative for arthralgias (left hip) and myalgias.  Neurological:  Negative for dizziness and headaches.  Psychiatric/Behavioral:  Negative for dysphoric mood.     Current Outpatient Medications on File Prior to Visit  Medication Sig Dispense Refill   Accu-Chek Softclix Lancets lancets Use as instructed 100 each 5   allopurinol (ZYLOPRIM) 300 MG tablet Take 1 tablet (300 mg total) by mouth daily. 90 tablet 3   amLODipine (NORVASC) 5 MG tablet TAKE 1 TABLET BY MOUTH ONCE DAILY 90 tablet 1   ammonium lactate (LAC-HYDRIN) 12 % lotion Apply 1 Application topically as needed for dry skin. 400 g 5   Blood Glucose Monitoring Suppl (ACCU-CHEK GUIDE) w/Device KIT 1 each by Does not apply route in the morning, at noon, and at bedtime. 1 kit 0   celecoxib (CELEBREX) 200 MG capsule Take 1 capsule (200 mg total) by mouth daily. 90 capsule 1   clopidogrel (PLAVIX) 75 MG tablet TAKE 1 TABLET BY MOUTH ONCE DAILY *AUROBINDO BRAND ONLY* 30 tablet 10   colchicine 0.6 MG tablet TAKE 1 TABLET(0.6 MG) BY MOUTH TWICE DAILY FOR 7 DAYS THEN TAKE 1 TABLET(0.6 MG) BY MOUTH DAILY FOR 23 DAYS 180 tablet 1   ezetimibe (ZETIA) 10 MG tablet Take 10 mg by mouth daily.     famotidine (PEPCID) 20 MG tablet TAKE ONE (1) TABLET BY MOUTH TWICE DAILY 180 tablet 1   gabapentin (NEURONTIN) 300 MG capsule TAKE 1 CAPSULE (300MG ) BY MOUTH TWICE DAILY.(YELLOW CAP) 180 capsule 1   Glucagon, rDNA, (GLUCAGON EMERGENCY) 1 MG KIT Inject 1 mg into the muscle as needed (BG less than 70).     glucose blood (ACCU-CHEK GUIDE) test strip Use as instructed 100 each 5   insulin glargine (LANTUS SOLOSTAR) 100 UNIT/ML Solostar Pen ADMINISTER 30 UNITS UNDER THE SKIN DAILY 15 mL 3   isosorbide mononitrate (IMDUR) 60 MG 24 hr tablet TAKE 1 TABLET BY MOUTH ONCE DAILY 90 tablet 1   ketoconazole (NIZORAL) 2 % cream Apply 1 Application topically daily. 60 g  2   lansoprazole (PREVACID) 30 MG capsule TAKE 1 CAPSULE BY MOUTH AT NOON 90 capsule 1   metoprolol succinate (TOPROL-XL) 25 MG 24 hr tablet TAKE 1 TABLET BY MOUTH ONCE DAILY 90 tablet 1   nitroGLYCERIN (NITROSTAT) 0.4 MG SL tablet DISSOLVE 1 TABLET UNDER TONGUE EVERY 5 MINUTES AS NEEDED CHEST PAIN. 25 tablet 1   polyethylene glycol (MIRALAX / GLYCOLAX) 17 g packet Take 17 g by mouth daily.     ranolazine (RANEXA) 500 MG 12 hr tablet TAKE ONE (1) TABLET BY MOUTH TWICE DAILY 180 tablet 1  rosuvastatin (CRESTOR) 40 MG tablet TAKE 1 TABLET BY MOUTH EVERY EVENING 30 tablet 10   sacubitril-valsartan (ENTRESTO) 24-26 MG TAKE ONE (1) TABLET BY MOUTH TWICE DAILY 60 tablet 10   sertraline (ZOLOFT) 25 MG tablet TAKE 1 TABLET BY MOUTH ONCE DAILY 90 tablet 1   spironolactone (ALDACTONE) 25 MG tablet TAKE 1/2 TABLET(12.5) BY MOUTH DAILY. 135 tablet 1   traZODone (DESYREL) 150 MG tablet TAKE 1 TABLET BY MOUTH EVERY DAY AT BEDTIME 90 tablet 1   TRELEGY ELLIPTA 100-62.5-25 MCG/ACT AEPB INHALE 1 PUFF ONCE DAILY 60 each 2   ULTICARE SHORT PEN NEEDLES 31G X 8 MM MISC USE AS DIRECTED (ONCE DAILY WITH LANTUS INJECTION AS INSTRUCTED) 100 each 4   XIGDUO XR 5-500 MG TB24 TAKE 1 TABLET BY MOUTH ONCE DAILY *DISCONTINUE METFORMIN 30 tablet 10   No current facility-administered medications on file prior to visit.   Past Medical History:  Diagnosis Date   Acute combined systolic and diastolic CHF, NYHA class 2 (HCC) 01/22/2020   Acute gout of left elbow 02/14/2022   Acute idiopathic gout of right wrist 12/29/2021   Alcohol abuse    Atherosclerosis of coronary artery of native heart with stable angina pectoris, unspecified vessel or lesion type (HCC) 01/23/2016   Overview:  Completely occluded circumflex artery proximal to obtuse marginal 1 basin cardiac catheter from 2017   Atherosclerosis of native coronary artery of native heart without angina pectoris 01/23/2016   Overview:  Completely occluded circumflex artery  proximal to obtuse marginal 1 basin cardiac catheter from 2017   Bilateral hip pain 11/17/2021   BMI 45.0-49.9, adult (HCC) 01/13/2013   Cellulitis of right upper extremity 01/01/2022   Centrilobular emphysema (HCC) 01/16/2022   Chronic kidney disease    Chronic pain 10/19/2019   Sees pain clinic   COPD (chronic obstructive pulmonary disease) (HCC)    COPD exacerbation (HCC) 10/03/2019   Coronary artery disease involving native heart 02/08/2020   Coronary atherosclerosis of native coronary artery 01/23/2016   Overview:  Completely occluded circumflex artery proximal to obtuse marginal 1 basin cardiac catheter from 2017   Depression, major, single episode, moderate (HCC) 07/11/2020   Diabetic polyneuropathy (HCC) 12/06/2019   ED (erectile dysfunction) 12/06/2019   Essential hypertension 01/23/2016   Essential thrombocythemia (HCC) 09/29/2011   GERD (gastroesophageal reflux disease) 11/17/2021   GERD without esophagitis    HFrEF (heart failure with reduced ejection fraction) (HCC) 02/08/2020   Knee pain, left 02/12/2012   Formatting of this note might be different from the original. Left   Leukocytosis 09/04/2011   Lumbar pain 11/17/2021   Mild major depression, single episode (HCC) 02/14/2022   Mixed hyperlipidemia 06/18/2017   Mixed incontinence 02/24/2022   Morbid obesity (HCC) 11/21/2020   Morbid obesity with BMI of 40.0-44.9, adult (HCC) 01/13/2013   Myocardial infarction (HCC)    04-13-2011   Obstructive chronic bronchitis with exacerbation (HCC) 10/03/2019   Obstructive sleep apnea 07/18/2011   Olecranon bursitis of left elbow 02/14/2022   Olecranon bursitis of left elbow 02/14/2022   Opiate abuse, continuous (HCC)    Osteoarthritis    Pain in knee joint 02/12/2012   Formatting of this note might be different from the original. Left   Personal history of stroke with residual effects 07/24/2021   Pneumonia    Polysubstance abuse (HCC)    Poor dentition    Stage 3b chronic kidney  disease (HCC) 12/27/2020   Thrombocytosis 09/29/2011   Tobacco dependence 01/23/2016   Tobacco use  disorder 01/23/2016   Type 2 diabetes mellitus with diabetic polyneuropathy [facility])    Past Surgical History:  Procedure Laterality Date   HERNIA REPAIR  2009   I & D KNEE WITH POLY EXCHANGE  09/03/2011   Procedure: IRRIGATION AND DEBRIDEMENT KNEE WITH POLY EXCHANGE;  Surgeon: Shelda Pal, MD;  Location: [facility] ORS;  Service: Orthopedics;  Laterality: Left;   TOOTH EXTRACTION Bilateral 08/06/2017   Procedure: DENTAL RESTORATION/EXTRACTIONS;  Surgeon: Ocie Doyne, DDS;  Location: [facility] OR;  Service: Oral Surgery;  Laterality: Bilateral;   TOTAL KNEE ARTHROPLASTY   right  feb 2012   left march 2012 r   TOTAL KNEE REVISION  06/23/2011   Procedure: TOTAL KNEE REVISION;  Surgeon: Shelda Pal;  Location: [facility];  Service: Orthopedics;  Laterality: Left;  femomal nerve block done in holding area without incident    Family History  Problem Relation Age of Onset   Colon cancer Other    Colon cancer Mother    Social History   Socioeconomic History   Marital status: Widowed    Spouse name: Not on file   Number of children: 5   Years of education: Not on file   Highest education level: Not on file  Occupational History   Occupation: Disabled  Tobacco Use   Smoking status: Every Day    Current packs/day: 1.00    Average packs/day: 1 pack/day for 50.0 years (50.0 ttl pk-yrs)    Types: Cigarettes   Smokeless tobacco: Never  Vaping Use   Vaping status: Former  Substance and Sexual Activity   Alcohol use: Not on file    Comment: heavy drinker in past. occasionally has a beer. Not on regular basis.   Drug use: Not Currently   Sexual activity: Not Currently  Other Topics Concern   Not on file  Social History Narrative   Not on file   Social Drivers of Health   Financial Resource Strain: Low Risk  (08/14/2022)   Overall Financial Resource Strain (CARDIA)    Difficulty of Paying Living  Expenses: Not hard at all  Food Insecurity: No Food Insecurity (08/03/2022)   Hunger Vital Sign    Worried About Running Out of Food in the Last Year: Never true    Ran Out of Food in the Last Year: Never true  Transportation Needs: No Transportation Needs (08/14/2022)   PRAPARE - Administrator, Civil Service (Medical): No    Lack of Transportation (Non-Medical): No  Physical Activity: Inactive (07/24/2022)   Exercise Vital Sign    Days of Exercise per Week: 0 days    Minutes of Exercise per Session: 0 min  Stress: No Stress Concern Present (08/03/2022)   [employer] of Occupational Health - Occupational Stress Questionnaire    Feeling of Stress : Not at all  Social Connections: Socially Isolated (08/03/2022)   Social Connection and Isolation Panel [NHANES]    Frequency of Communication with Friends and Family: More than three times a week    Frequency of Social Gatherings with Friends and Family: More than three times a week    Attends Religious Services: Never    Database administrator or Organizations: No    Attends Banker Meetings: Never    Marital Status: Widowed    Objective:  BP 130/74   Pulse 94   Temp 97.6 F (36.4 C)   Ht 5\' 8"  (1.727 m)   Wt 287 lb (130.2 kg)   SpO2 97%  BMI 43.64 kg/m      08/18/2023   10:19 AM 05/11/2023   10:21 AM 02/01/2023   10:13 AM  BP/Weight  Systolic BP 130 112 130  Diastolic BP 74 78 78  Wt. (Lbs) 287 298 303  BMI 43.64 kg/m2 45.31 kg/m2 46.07 kg/m2    Physical Exam Vitals reviewed.  Constitutional:      Appearance: Normal appearance. He is obese.  Neck:     Vascular: No carotid bruit.  Cardiovascular:     Rate and Rhythm: Normal rate and regular rhythm.     Heart sounds: Normal heart sounds.  Pulmonary:     Effort: Pulmonary effort is normal.     Breath sounds: Normal breath sounds. No wheezing, rhonchi or rales.  Abdominal:     General: Bowel sounds are normal.     Palpations: Abdomen is  soft.     Tenderness: There is no abdominal tenderness.  Neurological:     Mental Status: He is alert and oriented to person, place, and time.  Psychiatric:        Mood and Affect: Mood normal.        Behavior: Behavior normal.     Diabetic Foot Exam - Simple   Simple Foot Form Diabetic Foot exam was performed with the following findings: Yes 08/18/2023 11:11 AM  Visual Inspection See comments: Yes Sensation Testing See comments: Yes Pulse Check See comments: Yes Comments CALLUSES. THICKENED NAILS.  DECREASED SENSATION.  1+ DORSALIS PEDIS PULSES BL.       Lab Results  Component Value Date   WBC 11.0 (H) 08/18/2023   HGB 16.4 08/18/2023   HCT 49.4 08/18/2023   PLT 399 08/18/2023   GLUCOSE 188 (H) 08/18/2023   CHOL 191 08/18/2023   TRIG 154 (H) 08/18/2023   HDL 42 08/18/2023   LDLCALC 122 (H) 08/18/2023   ALT 14 08/18/2023   AST 16 08/18/2023   NA 140 08/18/2023   K 4.7 08/18/2023   CL 99 08/18/2023   CREATININE 1.11 08/18/2023   BUN 7 (L) 08/18/2023   CO2 26 08/18/2023   TSH 1.140 07/09/2022   INR 1.81 (H) 09/07/2011   HGBA1C 9.0 (H) 08/18/2023   MICROALBUR 80 09/27/2020      Assessment & Plan:    Essential hypertension Assessment & Plan: Patient on spironolactone, metoprolol, and amlodipine. -Continue current medications.  Orders: -     CBC with Differential/Platelet -     Comprehensive metabolic panel  Gastroesophageal reflux disease without esophagitis Assessment & Plan: Patient on famotidine and Prevacid. -Continue current medications.   Mixed hyperlipidemia Assessment & Plan: Slightly elevated cholesterol on last labs. Patient currently on rosuvastatin and Zetia. -Continue current medications and reassess cholesterol on next labs.  Orders: -     Lipid panel -     Lipid panel; Future  Type 2 diabetes mellitus with diabetic polyneuropathy, without long-term current use of insulin (HCC) Assessment & Plan: Poor control with last A1c of  9.2. Patient admits to drinking regular soda. Currently on Lantus insulin, Ozempic, and Xigduo. Unclear if patient has increased Ozempic to recommended 2mg  due to confusion with pen device. -Advise patient to switch to diet soda. -Increase Ozempic to 2mg  weekly. -Plan to possibly increase Xigduo dose pending lab results. -Continue Lantus insulin.  Orders: -     Hemoglobin A1c -     Microalbumin / creatinine urine ratio -     Semaglutide (2 MG/DOSE); Inject 2 mg as directed once a week.  Dispense: 9 mL; Refill: 1 -     CBC with Differential/Platelet; Future -     Comprehensive metabolic panel; Future -     Hemoglobin A1c; Future  Chronic pain syndrome Assessment & Plan: Patient reports daily pain of 4-5/10, primarily in the back. Pain sometimes radiates down left leg. Patient on oxycodone 5-325mg  four times daily. -Continue current pain management regimen. Patient needs a transfer shower bench due to chronic back pain and leg pan.    Idiopathic chronic gout of multiple sites without tophus Assessment & Plan: No recent flares. Patient on allopurinol. -Continue allopurinol.   Depression, major, recurrent, mild (HCC) Assessment & Plan: Patient denies current symptoms. On sertraline. -Continue sertraline.   Primary insomnia Assessment & Plan: Patient reports good sleep with current regimen of trazodone. -Continue trazodone.      Meds ordered this encounter  Medications   Semaglutide, 2 MG/DOSE, 8 MG/3ML SOPN    Sig: Inject 2 mg as directed once a week.    Dispense:  9 mL    Refill:  1    Orders Placed This Encounter  Procedures   CBC with Differential/Platelet   Comprehensive metabolic panel   Hemoglobin A1c   Lipid panel   Microalbumin / creatinine urine ratio   CBC with Differential/Platelet   Comprehensive metabolic panel   Hemoglobin A1c   Lipid panel     Follow-up: Return in about 3 months (around 11/15/2023) for lab visit and chronic visit 2-3 days after  labs drawn. Clayborn Bigness I Leal-Borjas,acting as a scribe for Blane Ohara, MD.,have documented all relevant documentation on the behalf of Blane Ohara, MD,as directed by  Blane Ohara, MD while in the presence of Blane Ohara, MD.   An After Visit Summary was printed and given to the patient.  I attest that I have reviewed this visit and agree with the plan scribed by my staff.   Blane Ohara, MD [facility] [phone]

## 2023-08-18 ENCOUNTER — Ambulatory Visit (INDEPENDENT_AMBULATORY_CARE_PROVIDER_SITE_OTHER): Payer: 59 | Admitting: Family Medicine

## 2023-08-18 ENCOUNTER — Encounter: Payer: Self-pay | Admitting: Family Medicine

## 2023-08-18 VITALS — BP 130/74 | HR 94 | Temp 97.6°F | Ht 68.0 in | Wt 287.0 lb

## 2023-08-18 DIAGNOSIS — E782 Mixed hyperlipidemia: Secondary | ICD-10-CM | POA: Diagnosis not present

## 2023-08-18 DIAGNOSIS — E1142 Type 2 diabetes mellitus with diabetic polyneuropathy: Secondary | ICD-10-CM

## 2023-08-18 DIAGNOSIS — M1A09X0 Idiopathic chronic gout, multiple sites, without tophus (tophi): Secondary | ICD-10-CM

## 2023-08-18 DIAGNOSIS — F330 Major depressive disorder, recurrent, mild: Secondary | ICD-10-CM

## 2023-08-18 DIAGNOSIS — I10 Essential (primary) hypertension: Secondary | ICD-10-CM

## 2023-08-18 DIAGNOSIS — K219 Gastro-esophageal reflux disease without esophagitis: Secondary | ICD-10-CM

## 2023-08-18 DIAGNOSIS — Z122 Encounter for screening for malignant neoplasm of respiratory organs: Secondary | ICD-10-CM

## 2023-08-18 DIAGNOSIS — G894 Chronic pain syndrome: Secondary | ICD-10-CM

## 2023-08-18 DIAGNOSIS — F5101 Primary insomnia: Secondary | ICD-10-CM

## 2023-08-18 MED ORDER — SEMAGLUTIDE (2 MG/DOSE) 8 MG/3ML ~~LOC~~ SOPN: 2.0000 mg | PEN_INJECTOR | SUBCUTANEOUS | 1 refills | Status: AC

## 2023-08-18 NOTE — Patient Instructions (Addendum)
Diabetes: Increase Ozempic to 2 mg weekly.  Continue other diabetes medicines.  I do anticipate increasing the Xigduo XR to a higher dose following her labs coming back.  Continue insulin.  Thank you  Recommend get shingles vaccine series and tetanus vaccine at your pharmacy.  Diabetic recommendations: Visits with PCP every 3 months depending on Hemoglobin A1C control (goal is < 7.0.) Diabetic Eye Care: See an eye doctor at least annually. More if recommended by the eye doctor.   Dietary avoidance/limitation of sugar and carbohydrates, fat, and salt. Exercise (aerobic) 3-5 days per week.  Kidney Care:  Urine microalbumin (protein) should be checked at least annually. To treat or prevent nephropathy (leaking of protein in urine) - all patients should be on an ACE or an ARB Medicines. Heart Disease Prevention: All diabetics should be on a statin cholesterol medicine regardless of cholesterol levels. If cholesterol levels are high, recommend treat to goal of LDL cholesterol level of less than 70. Foot Care: Check feet visually daily. Blood pressure (hypertension) control < 130/85 Vaccinations: annual flu shot, pneumovax 23, tetanus (tdap)    Recommend quit smoking.

## 2023-08-19 ENCOUNTER — Telehealth: Payer: Self-pay

## 2023-08-19 ENCOUNTER — Other Ambulatory Visit: Payer: Self-pay | Admitting: Family Medicine

## 2023-08-19 DIAGNOSIS — F17218 Nicotine dependence, cigarettes, with other nicotine-induced disorders: Secondary | ICD-10-CM

## 2023-08-19 LAB — CBC WITH DIFFERENTIAL/PLATELET
Basophils Absolute: 0 10*3/uL (ref 0.0–0.2)
Basos: 0 %
EOS (ABSOLUTE): 0.1 10*3/uL (ref 0.0–0.4)
Eos: 1 %
Hematocrit: 49.4 % (ref 37.5–51.0)
Hemoglobin: 16.4 g/dL (ref 13.0–17.7)
Immature Grans (Abs): 0.1 10*3/uL (ref 0.0–0.1)
Immature Granulocytes: 1 %
Lymphocytes Absolute: 2.6 10*3/uL (ref 0.7–3.1)
Lymphs: 24 %
MCH: 30.3 pg (ref 26.6–33.0)
MCHC: 33.2 g/dL (ref 31.5–35.7)
MCV: 91 fL (ref 79–97)
Monocytes Absolute: 0.6 10*3/uL (ref 0.1–0.9)
Monocytes: 5 %
Neutrophils Absolute: 7.5 10*3/uL — ABNORMAL HIGH (ref 1.4–7.0)
Neutrophils: 69 %
Platelets: 399 10*3/uL (ref 150–450)
RBC: 5.41 x10E6/uL (ref 4.14–5.80)
RDW: 12.5 % (ref 11.6–15.4)
WBC: 11 10*3/uL — ABNORMAL HIGH (ref 3.4–10.8)

## 2023-08-19 LAB — LIPID PANEL
Chol/HDL Ratio: 4.5 {ratio} (ref 0.0–5.0)
Cholesterol, Total: 191 mg/dL (ref 100–199)
HDL: 42 mg/dL (ref >39)
LDL Chol Calc (NIH): 122 mg/dL — ABNORMAL HIGH (ref 0–99)
Triglycerides: 154 mg/dL — ABNORMAL HIGH (ref 0–149)
VLDL Cholesterol Cal: 27 mg/dL (ref 5–40)

## 2023-08-19 LAB — MICROALBUMIN / CREATININE URINE RATIO
Creatinine, Urine: 87.6 mg/dL
Microalb/Creat Ratio: 68 mg/g{creat} — ABNORMAL HIGH (ref 0–29)
Microalbumin, Urine: 60 ug/mL

## 2023-08-19 LAB — COMPREHENSIVE METABOLIC PANEL
ALT: 14 [IU]/L (ref 0–44)
AST: 16 [IU]/L (ref 0–40)
Albumin: 4.3 g/dL (ref 3.9–4.9)
Alkaline Phosphatase: 80 [IU]/L (ref 44–121)
BUN/Creatinine Ratio: 6 — ABNORMAL LOW (ref 10–24)
BUN: 7 mg/dL — ABNORMAL LOW (ref 8–27)
Bilirubin Total: 0.5 mg/dL (ref 0.0–1.2)
CO2: 26 mmol/L (ref 20–29)
Calcium: 9.7 mg/dL (ref 8.6–10.2)
Chloride: 99 mmol/L (ref 96–106)
Creatinine, Ser: 1.11 mg/dL (ref 0.76–1.27)
Globulin, Total: 2.5 g/dL (ref 1.5–4.5)
Glucose: 188 mg/dL — ABNORMAL HIGH (ref 70–99)
Potassium: 4.7 mmol/L (ref 3.5–5.2)
Sodium: 140 mmol/L (ref 134–144)
Total Protein: 6.8 g/dL (ref 6.0–8.5)
eGFR: 74 mL/min/{1.73_m2} (ref >59)

## 2023-08-19 LAB — HEMOGLOBIN A1C
Est. average glucose Bld gHb Est-mCnc: 212 mg/dL
Hgb A1c MFr Bld: 9 % — ABNORMAL HIGH (ref 4.8–5.6)

## 2023-08-19 NOTE — Telephone Encounter (Signed)
Taking shoes to [place] 2/25 for his appt

## 2023-08-20 ENCOUNTER — Other Ambulatory Visit: Payer: Self-pay

## 2023-08-20 ENCOUNTER — Telehealth: Payer: Self-pay

## 2023-08-20 MED ORDER — REPATHA SURECLICK 140 MG/ML ~~LOC~~ SOAJ: 140.0000 mg | SUBCUTANEOUS | 0 refills | Status: DC

## 2023-08-20 NOTE — Telephone Encounter (Signed)
PA submitted and approved via covermymeds for repatha.

## 2023-08-22 DIAGNOSIS — F5101 Primary insomnia: Secondary | ICD-10-CM

## 2023-08-22 HISTORY — DX: Primary insomnia: F51.01

## 2023-08-22 NOTE — Assessment & Plan Note (Signed)
 Patient on famotidine and Prevacid. -Continue current medications.

## 2023-08-22 NOTE — Assessment & Plan Note (Signed)
 Patient reports good sleep with current regimen of trazodone. -Continue trazodone.

## 2023-08-22 NOTE — Assessment & Plan Note (Signed)
 No recent flares. Patient on allopurinol. -Continue allopurinol.

## 2023-08-22 NOTE — Assessment & Plan Note (Signed)
 Slightly elevated cholesterol on last labs. Patient currently on rosuvastatin and Zetia. -Continue current medications and reassess cholesterol on next labs.

## 2023-08-22 NOTE — Assessment & Plan Note (Addendum)
 Patient reports daily pain of 4-5/10, primarily in the back. Pain sometimes radiates down left leg. Patient on oxycodone 5-325mg  four times daily. -Continue current pain management regimen. Patient needs a transfer shower bench due to chronic back pain and leg pan.

## 2023-08-22 NOTE — Assessment & Plan Note (Signed)
 Poor control with last A1c of 9.2. Patient admits to drinking regular soda. Currently on Lantus insulin, Ozempic, and Xigduo. Unclear if patient has increased Ozempic to recommended 2mg  due to confusion with pen device. -Advise patient to switch to diet soda. -Increase Ozempic to 2mg  weekly. -Plan to possibly increase Xigduo dose pending lab results. -Continue Lantus insulin.

## 2023-08-22 NOTE — Assessment & Plan Note (Signed)
 Patient denies current symptoms. On sertraline. -Continue sertraline.

## 2023-08-22 NOTE — Assessment & Plan Note (Signed)
 Patient on spironolactone, metoprolol, and amlodipine. -Continue current medications.

## 2023-08-23 ENCOUNTER — Other Ambulatory Visit: Payer: Self-pay | Admitting: Family Medicine

## 2023-08-23 DIAGNOSIS — N401 Enlarged prostate with lower urinary tract symptoms: Secondary | ICD-10-CM

## 2023-08-23 DIAGNOSIS — J432 Centrilobular emphysema: Secondary | ICD-10-CM

## 2023-08-23 DIAGNOSIS — I2510 Atherosclerotic heart disease of native coronary artery without angina pectoris: Secondary | ICD-10-CM

## 2023-08-30 ENCOUNTER — Other Ambulatory Visit: Payer: Self-pay | Admitting: Family Medicine

## 2023-08-30 DIAGNOSIS — M545 Low back pain, unspecified: Secondary | ICD-10-CM

## 2023-08-30 MED ORDER — OXYCODONE-ACETAMINOPHEN 5-325 MG PO TABS: ORAL_TABLET | ORAL | 0 refills | Status: DC

## 2023-08-30 NOTE — Telephone Encounter (Signed)
 Last Fill: 08/02/23 120 tabs/0 refills  Last OV: 08/18/23 Next OV: 11/19/23  Routing to provider for review/authorization.

## 2023-08-30 NOTE — Telephone Encounter (Signed)
 Copied from CRM [id number]. Topic: Clinical - Medication Refill >> Aug 30, 2023 11:45 AM Adelina Mings wrote: Most Recent Primary Care Visit:  Provider: [name]  Department[name]  Visit Type: OFFICE VISIT  Date: 08/18/2023  Medication: oxyCODONE-acetaminophen (PERCOCET/ROXICET) 5-325 MG tablet   Has the patient contacted their pharmacy? Yes (Agent: If no, request that the patient contact the pharmacy for the refill. If patient does not wish to contact the pharmacy document the reason why and proceed with request.) (Agent: If yes, when and what did the pharmacy advise?)  Is this the correct pharmacy for this prescription? Yes If no, delete pharmacy and type the correct one.  This is the patient's preferred pharmacy:  [id number] [place] - [address] [place]  [address] Phone: [phone] Fax: [phone]  [facility] - [place], [place] - [address] [place] [address] Phone: [phone] Fax: [phone]  [facility] - [address], [place] - [address] [place] [address] Phone: [phone] Fax: [phone]   Has the prescription been filled recently? No  Is the patient out of the medication? Yes  Has the patient been seen for an appointment in the last year OR does the patient have an upcoming appointment? Yes  Can we respond through MyChart? No  Agent: Please be advised that Rx refills may take up to 3 business days. We ask that you follow-up with your pharmacy.

## 2023-08-31 ENCOUNTER — Other Ambulatory Visit: Payer: Self-pay | Admitting: Family Medicine

## 2023-08-31 ENCOUNTER — Ambulatory Visit (INDEPENDENT_AMBULATORY_CARE_PROVIDER_SITE_OTHER): Payer: 59

## 2023-08-31 DIAGNOSIS — M2142 Flat foot [pes planus] (acquired), left foot: Secondary | ICD-10-CM | POA: Diagnosis not present

## 2023-08-31 DIAGNOSIS — N401 Enlarged prostate with lower urinary tract symptoms: Secondary | ICD-10-CM

## 2023-08-31 DIAGNOSIS — M2041 Other hammer toe(s) (acquired), right foot: Secondary | ICD-10-CM

## 2023-08-31 DIAGNOSIS — E1151 Type 2 diabetes mellitus with diabetic peripheral angiopathy without gangrene: Secondary | ICD-10-CM

## 2023-08-31 DIAGNOSIS — M2141 Flat foot [pes planus] (acquired), right foot: Secondary | ICD-10-CM | POA: Diagnosis not present

## 2023-08-31 DIAGNOSIS — L84 Corns and callosities: Secondary | ICD-10-CM

## 2023-08-31 NOTE — Progress Notes (Signed)

## 2023-09-03 ENCOUNTER — Other Ambulatory Visit: Payer: Self-pay | Admitting: Family Medicine

## 2023-09-06 ENCOUNTER — Other Ambulatory Visit: Payer: Self-pay | Admitting: Family Medicine

## 2023-09-06 DIAGNOSIS — J449 Chronic obstructive pulmonary disease, unspecified: Secondary | ICD-10-CM | POA: Diagnosis not present

## 2023-09-08 ENCOUNTER — Other Ambulatory Visit: Payer: Self-pay | Admitting: Family Medicine

## 2023-09-08 DIAGNOSIS — E1142 Type 2 diabetes mellitus with diabetic polyneuropathy: Secondary | ICD-10-CM

## 2023-09-13 DIAGNOSIS — G894 Chronic pain syndrome: Secondary | ICD-10-CM | POA: Diagnosis not present

## 2023-09-21 ENCOUNTER — Other Ambulatory Visit: Payer: Self-pay | Admitting: Family Medicine

## 2023-09-22 ENCOUNTER — Other Ambulatory Visit: Payer: Self-pay | Admitting: Family Medicine

## 2023-09-22 DIAGNOSIS — M545 Low back pain, unspecified: Secondary | ICD-10-CM

## 2023-09-22 NOTE — Telephone Encounter (Signed)
 Copied from CRM [id number]. Topic: Clinical - Medication Refill >> Sep 22, 2023 10:56 AM Gildardo Pounds wrote: Most Recent Primary Care Visit:  Provider: [name], [facility]  Department: [name]  Visit Type: OFFICE VISIT  Date: 08/18/2023  Medication: oxyCODONE-acetaminophen (PERCOCET/ROXICET) 5-325 MG tablet  Has the patient contacted their pharmacy? No (Agent: If no, request that the patient contact the pharmacy for the refill. If patient does not wish to contact the pharmacy document the reason why and proceed with request.) (Agent: If yes, when and what did the pharmacy advise?)  Is this the correct pharmacy for this prescription? Yes If no, delete pharmacy and type the correct one.  This is the patient's preferred pharmacy:  [facility] #[id number] [facility], [place] - [address] [place] [address] Phone: [phone] Fax: [phone]     Has the prescription been filled recently? No  Is the patient out of the medication? No  Has the patient been seen for an appointment in the last year OR does the patient have an upcoming appointment? Yes  Can we respond through MyChart? No  Agent: Please be advised that Rx refills may take up to 3 business days. We ask that you follow-up with your pharmacy.

## 2023-09-23 MED ORDER — OXYCODONE-ACETAMINOPHEN 5-325 MG PO TABS: ORAL_TABLET | ORAL | 0 refills | Status: DC

## 2023-09-24 DIAGNOSIS — J449 Chronic obstructive pulmonary disease, unspecified: Secondary | ICD-10-CM | POA: Insufficient documentation

## 2023-09-24 DIAGNOSIS — K219 Gastro-esophageal reflux disease without esophagitis: Secondary | ICD-10-CM | POA: Insufficient documentation

## 2023-09-27 ENCOUNTER — Other Ambulatory Visit: Payer: Self-pay | Admitting: Family Medicine

## 2023-09-27 DIAGNOSIS — M545 Low back pain, unspecified: Secondary | ICD-10-CM

## 2023-09-27 NOTE — Telephone Encounter (Signed)
 Copied from CRM [id number]. Topic: Clinical - Medication Refill >> Sep 27, 2023 10:03 AM DeAngela L wrote: Most Recent Primary Care Visit:  Provider: [name], [facility]  Department: [name]  Visit Type: OFFICE VISIT  Date: 08/18/2023  Medication: oxyCODONE-acetaminophen (PERCOCET/ROXICET) 5-325 MG tablet   Has the patient contacted their pharmacy? Yes (Agent: If no, request that the patient contact the pharmacy for the refill. If patient does not wish to contact the pharmacy document the reason why and proceed with request.) (Agent: If yes, when and what did the pharmacy advise?)  Is this the correct pharmacy for this prescription? Yes If no, delete pharmacy and type the correct one.  This is the patient's preferred pharmacy:  [facility] #[id number] [facility], [place] - [address] [place] [address] Phone: [phone] Fax: [phone]   Has the prescription been filled recently? Yes  Is the patient out of the medication? No  Has the patient been seen for an appointment in the last year OR does the patient have an upcoming appointment? Yes  Can we respond through MyChart? Yes  Agent: Please be advised that Rx refills may take up to 3 business days. We ask that you follow-up with your pharmacy.

## 2023-09-28 ENCOUNTER — Ambulatory Visit: Admitting: Cardiology

## 2023-09-28 MED ORDER — OXYCODONE-ACETAMINOPHEN 5-325 MG PO TABS: 1.0000 | ORAL_TABLET | Freq: Four times a day (QID) | ORAL | 0 refills | Status: DC | PRN

## 2023-09-29 ENCOUNTER — Other Ambulatory Visit: Payer: Self-pay | Admitting: Family Medicine

## 2023-10-07 DIAGNOSIS — J449 Chronic obstructive pulmonary disease, unspecified: Secondary | ICD-10-CM | POA: Diagnosis not present

## 2023-10-14 DIAGNOSIS — G894 Chronic pain syndrome: Secondary | ICD-10-CM | POA: Diagnosis not present

## 2023-10-18 ENCOUNTER — Ambulatory Visit: Attending: Cardiology | Admitting: Cardiology

## 2023-10-20 ENCOUNTER — Other Ambulatory Visit: Payer: Self-pay | Admitting: Family Medicine

## 2023-10-27 ENCOUNTER — Other Ambulatory Visit: Payer: Self-pay | Admitting: Radiology

## 2023-10-27 DIAGNOSIS — M545 Low back pain, unspecified: Secondary | ICD-10-CM

## 2023-10-27 MED ORDER — OXYCODONE-ACETAMINOPHEN 5-325 MG PO TABS
1.0000 | ORAL_TABLET | Freq: Four times a day (QID) | ORAL | 0 refills | Status: DC | PRN
Start: 2023-10-27 — End: 2023-11-19

## 2023-10-27 NOTE — Telephone Encounter (Signed)
 Copied from CRM [id number]. Topic: Clinical - Medication Refill >> Oct 27, 2023  8:47 AM Oddis Bench wrote: Most Recent Primary Care Visit:  Provider: [name], [facility]  Department: [name]  Visit Type: OFFICE VISIT  Date: 08/18/2023  Medication: oxyCODONE -acetaminophen  (PERCOCET/ROXICET) 5-325 MG tablet  Has the patient contacted their pharmacy? No (Agent: If no, request that the patient contact the pharmacy for the refill. If patient does not wish to contact the pharmacy document the reason why and proceed with request.) (Agent: If yes, when and what did the pharmacy advise?)  Is this the correct pharmacy for this prescription? Yes If no, delete pharmacy and type the correct one.  This is the patient's preferred pharmacy:  [facility] #[id number] [facility], [place] - [address] [place] [address] Phone: [phone] Fax: [phone]     Has the prescription been filled recently? Yes  Is the patient out of the medication? No  Has the patient been seen for an appointment in the last year OR does the patient have an upcoming appointment? Yes  Can we respond through MyChart? No  Agent: Please be advised that Rx refills may take up to 3 business days. We ask that you follow-up with your pharmacy.

## 2023-11-05 ENCOUNTER — Encounter (INDEPENDENT_AMBULATORY_CARE_PROVIDER_SITE_OTHER): Admitting: Podiatry

## 2023-11-05 DIAGNOSIS — Z91199 Patient's noncompliance with other medical treatment and regimen due to unspecified reason: Secondary | ICD-10-CM

## 2023-11-06 DIAGNOSIS — J449 Chronic obstructive pulmonary disease, unspecified: Secondary | ICD-10-CM | POA: Diagnosis not present

## 2023-11-07 NOTE — Progress Notes (Signed)
 Patient did not show for his scheduled appointment on 5/2/2025This encounter was created in error - please disregard.

## 2023-11-09 DIAGNOSIS — G894 Chronic pain syndrome: Secondary | ICD-10-CM | POA: Diagnosis not present

## 2023-11-11 ENCOUNTER — Ambulatory Visit: Payer: 59 | Admitting: Podiatry

## 2023-11-16 ENCOUNTER — Ambulatory Visit: Payer: 59

## 2023-11-16 ENCOUNTER — Other Ambulatory Visit

## 2023-11-16 DIAGNOSIS — E1142 Type 2 diabetes mellitus with diabetic polyneuropathy: Secondary | ICD-10-CM | POA: Diagnosis not present

## 2023-11-16 DIAGNOSIS — E782 Mixed hyperlipidemia: Secondary | ICD-10-CM | POA: Diagnosis not present

## 2023-11-17 ENCOUNTER — Ambulatory Visit: Payer: Self-pay | Admitting: Family Medicine

## 2023-11-17 LAB — LIPID PANEL
Chol/HDL Ratio: 4.9 ratio (ref 0.0–5.0)
Cholesterol, Total: 166 mg/dL (ref 100–199)
HDL: 34 mg/dL — ABNORMAL LOW (ref >39)
LDL Chol Calc (NIH): 101 mg/dL — ABNORMAL HIGH (ref 0–99)
Triglycerides: 176 mg/dL — ABNORMAL HIGH (ref 0–149)
VLDL Cholesterol Cal: 31 mg/dL (ref 5–40)

## 2023-11-17 LAB — COMPREHENSIVE METABOLIC PANEL WITH GFR
ALT: 13 IU/L (ref 0–44)
AST: 16 IU/L (ref 0–40)
Albumin: 4.2 g/dL (ref 3.9–4.9)
Alkaline Phosphatase: 74 IU/L (ref 44–121)
BUN/Creatinine Ratio: 9 — ABNORMAL LOW (ref 10–24)
BUN: 11 mg/dL (ref 8–27)
Bilirubin Total: 0.6 mg/dL (ref 0.0–1.2)
CO2: 19 mmol/L — ABNORMAL LOW (ref 20–29)
Calcium: 9.7 mg/dL (ref 8.6–10.2)
Chloride: 99 mmol/L (ref 96–106)
Creatinine, Ser: 1.19 mg/dL (ref 0.76–1.27)
Globulin, Total: 2.5 g/dL (ref 1.5–4.5)
Glucose: 192 mg/dL — ABNORMAL HIGH (ref 70–99)
Potassium: 4.2 mmol/L (ref 3.5–5.2)
Sodium: 136 mmol/L (ref 134–144)
Total Protein: 6.7 g/dL (ref 6.0–8.5)
eGFR: 68 mL/min/{1.73_m2} (ref >59)

## 2023-11-17 LAB — CBC WITH DIFFERENTIAL/PLATELET
Basophils Absolute: 0.1 10*3/uL (ref 0.0–0.2)
Basos: 1 %
EOS (ABSOLUTE): 0.1 10*3/uL (ref 0.0–0.4)
Eos: 1 %
Hematocrit: 48.6 % (ref 37.5–51.0)
Hemoglobin: 16.6 g/dL (ref 13.0–17.7)
Immature Grans (Abs): 0 10*3/uL (ref 0.0–0.1)
Immature Granulocytes: 0 %
Lymphocytes Absolute: 3.6 10*3/uL — ABNORMAL HIGH (ref 0.7–3.1)
Lymphs: 35 %
MCH: 31.4 pg (ref 26.6–33.0)
MCHC: 34.2 g/dL (ref 31.5–35.7)
MCV: 92 fL (ref 79–97)
Monocytes Absolute: 0.7 10*3/uL (ref 0.1–0.9)
Monocytes: 7 %
Neutrophils Absolute: 5.7 10*3/uL (ref 1.4–7.0)
Neutrophils: 56 %
Platelets: 375 10*3/uL (ref 150–450)
RBC: 5.29 x10E6/uL (ref 4.14–5.80)
RDW: 13.4 % (ref 11.6–15.4)
WBC: 10.2 10*3/uL (ref 3.4–10.8)

## 2023-11-17 LAB — HEMOGLOBIN A1C
Est. average glucose Bld gHb Est-mCnc: 194 mg/dL
Hgb A1c MFr Bld: 8.4 % — ABNORMAL HIGH (ref 4.8–5.6)

## 2023-11-18 ENCOUNTER — Other Ambulatory Visit: Payer: Self-pay | Admitting: Family Medicine

## 2023-11-18 NOTE — Progress Notes (Signed)
 Subjective:  Patient ID: Brent Ortiz, male    DOB: Sep 13, 1960  Age: 63 y.o. MRN: [id number]  Chief Complaint  Patient presents with   Medical Management of Chronic Issues    HPI: DMII: Complicated by hyperlipidemia, hypertension, polyneuropathy.   Checks sugars 2-3 times per week.  Sugars:130-143 Current medicines: Lantus  30 units daily, ozempic  2mg  weekly, xigduo  XR 5-500 mg daily Checks feet most days.  Over due for eye doctor. Needs to schedule appt. Last A1C was 8.4%  Hyperlipidemia: Rosuvastatin  40 mg daily, Zetia 10 mg daily.   Hypertension: On Spironolactone  25 mg daily, metoprolol  25 mg twice a day, amlodipine  5 mg daily. Patient is working on [employer]. Unable to exercise.     CONGESTIVE HEART FAILURE/CAD: He takes Entresto  24-26 mg twice a day, Isosorbide  60 mg daily, Spironolactone  25 mg daily, Ranexa  500 mg twice daily, metoprolol  XL 25 mg daily. Cardiologist: Dr. [name].   Gout:  Allopurinol  100 mg daily. Colchicine  as needed.    Constipation: on no medicines    GERD: pepcid  20 mg twice daily. Protonix  40 mg one twice daily. Has not tried lower dose of protonix .    Insomnia: on trazodone  150 mg before bed. Works Firefighter.    Depression: on zoloft  25 mg daily.    Chronic Back Pain: Oxycodone  5-325 mg QID, pain scale 6/10.   Bladder incontinence: urge incontinence, but improved.       11/19/2023    9:46 AM 08/18/2023   10:20 AM 05/11/2023   10:25 AM 02/01/2023   10:33 AM 11/10/2022   10:57 AM  Depression screen PHQ 2/9  Decreased Interest 0 0 1 3 1   Down, Depressed, Hopeless 0 0 1 1 1   PHQ - 2 Score 0 0 2 4 2   Altered sleeping 0 0 0 0 1  Tired, decreased energy 0 0 0 2 1  Change in appetite 0 0 0 2 2  Feeling bad or failure about yourself  0 0 1 2 2   Trouble concentrating 0 0 0 1 1  Moving slowly or fidgety/restless 0 0 1 1 1   Suicidal thoughts 0 0 0 0 0  PHQ-9 Score 0 0 4 12 10   Difficult doing work/chores Not difficult at all Not difficult  at all Not difficult at all Somewhat difficult Somewhat difficult        11/19/2023    9:46 AM  Fall Risk   Falls in the past year? 0  Number falls in past yr: 0  Injury with Fall? 0  Risk for fall due to : Impaired balance/gait  Follow up Falls evaluation completed    Patient Care Team: Mercy Stall, MD as PCP - General (Family Medicine) [name], Micael Adas, MD as Consulting Physician (Cardiology) Luella Sager, DPM as Consulting Physician (Podiatry)   Review of Systems  Constitutional:  Negative for chills, diaphoresis, fatigue and fever.  HENT:  Negative for congestion, ear pain and sore throat.   Respiratory:  Negative for cough and shortness of breath.   Cardiovascular:  Negative for chest pain and leg swelling.  Gastrointestinal:  Positive for constipation (mild.). Negative for abdominal pain, diarrhea, nausea and vomiting.  Endocrine: Positive for polydipsia and polyuria. Negative for polyphagia.  Genitourinary:  Negative for dysuria and urgency.  Musculoskeletal:  Negative for arthralgias and myalgias.  Neurological:  Negative for dizziness and headaches.  Psychiatric/Behavioral:  Negative for dysphoric mood.     Current Outpatient Medications on File Prior to Visit  Medication Sig  Dispense Refill   Accu-Chek Softclix Lancets lancets Use as instructed 100 each 5   albuterol  (VENTOLIN  HFA) 108 (90 Base) MCG/ACT inhaler INHALE TWO (2) PUFFS BY MOUTH EVERY 4 HOURS AS NEEDED FOR SHORTNESS OF BREATH/WHEEZING 8.5 g 10   allopurinol  (ZYLOPRIM ) 300 MG tablet TAKE 1 TABLET BY MOUTH ONCE DAILY 30 tablet 10   amLODipine  (NORVASC ) 5 MG tablet TAKE 1 TABLET BY MOUTH ONCE DAILY 90 tablet 11   ammonium lactate  (LAC-HYDRIN ) 12 % lotion Apply 1 Application topically as needed for dry skin. 400 g 5   aspirin  EC (ASPIRIN  LOW DOSE) 81 MG tablet TAKE 1 TABLET BY MOUTH ONCE DAILY *SWALLOW WHOLE* 30 tablet 10   Blood Glucose Monitoring Suppl (ACCU-CHEK GUIDE) w/Device KIT 1 each by Does  not apply route in the morning, at noon, and at bedtime. 1 kit 0   celecoxib  (CELEBREX ) 200 MG capsule Take 1 capsule (200 mg total) by mouth daily. 90 capsule 1   clopidogrel  (PLAVIX ) 75 MG tablet TAKE 1 TABLET BY MOUTH ONCE DAILY *AUROBINDO BRAND ONLY* 30 tablet 10   colchicine  0.6 MG tablet TAKE ONE (1) TABLET BY MOUTH TWICE DAILY FOR 7 DAYS, THEN 1 TABLET ONCE DAILY FOR 23 DAYS 30 tablet 10   Evolocumab  (REPATHA  SURECLICK) 140 MG/ML SOAJ INJECT 140 MG INTO THE SKIN EVERY 14 (FOURTEEN) DAYS. 6 mL 3   ezetimibe (ZETIA) 10 MG tablet Take 10 mg by mouth daily.     famotidine  (PEPCID ) 20 MG tablet TAKE 1 TABLET BY MOUTH TWICE DAILY 180 tablet 11   gabapentin  (NEURONTIN ) 300 MG capsule TAKE ONE (1) CAPSULE BY MOUTH TWICE DAILY 60 capsule 10   Glucagon, rDNA, (GLUCAGON EMERGENCY) 1 MG KIT Inject 1 mg into the muscle as needed (BG less than 70).     glucose blood (ACCU-CHEK GUIDE) test strip Use as instructed 100 each 5   insulin  glargine (LANTUS  SOLOSTAR) 100 UNIT/ML Solostar Pen INJECT 30 UNITS SUBCUTANEOUSLY ONCE DAILY 15 mL 10   isosorbide  mononitrate (IMDUR ) 60 MG 24 hr tablet TAKE 1 TABLET BY MOUTH ONCE DAILY 90 tablet 11   ketoconazole  (NIZORAL ) 2 % cream Apply 1 Application topically daily. 60 g 2   lansoprazole  (PREVACID ) 30 MG capsule TAKE 1 CAPSULE BY MOUTH AT NOON 90 capsule 11   metoprolol  succinate (TOPROL -XL) 25 MG 24 hr tablet TAKE 1 TABLET BY MOUTH ONCE DAILY 90 tablet 11   nitroGLYCERIN  (NITROSTAT ) 0.4 MG SL tablet DISSOLVE 1 TABLET UNDER TONGUE EVERY 5 MINUTES AS NEEDED CHEST PAIN. 25 tablet 1   ranolazine  (RANEXA ) 500 MG 12 hr tablet TAKE 1 TABLET BY MOUTH TWICE DAILY 180 tablet 11   rosuvastatin  (CRESTOR ) 40 MG tablet TAKE 1 TABLET BY MOUTH EVERY EVENING 30 tablet 10   sacubitril -valsartan  (ENTRESTO ) 24-26 MG TAKE ONE (1) TABLET BY MOUTH TWICE DAILY 60 tablet 10   Semaglutide , 2 MG/DOSE, 8 MG/3ML SOPN Inject 2 mg as directed once a week. 9 mL 1   sertraline  (ZOLOFT ) 25 MG tablet  TAKE 1 TABLET BY MOUTH ONCE DAILY 30 tablet 5   spironolactone  (ALDACTONE ) 25 MG tablet TAKE 1/2 TABLET BY MOUTH ONCE DAILY 15 tablet 10   tamsulosin  (FLOMAX ) 0.4 MG CAPS capsule TAKE 1 CAPSULE BY MOUTH ONCE DAILY *WORKHARDT BRAND ONLY* 30 capsule 10   TRELEGY ELLIPTA  100-62.5-25 MCG/ACT AEPB INHALE 1 PUFF ONCE DAILY 60 each 2   ULTICARE SHORT PEN NEEDLES 31G X 8 MM MISC USE AS DIRECTED (ONCE DAILY WITH LANTUS  INJECTION AS INSTRUCTED)  100 each 4   No current facility-administered medications on file prior to visit.   Past Medical History:  Diagnosis Date   Acute combined systolic and diastolic CHF, NYHA class 2 (HCC) 01/22/2020   Acute gout of left elbow 02/14/2022   Acute idiopathic gout of right wrist 12/29/2021   Alcohol  abuse    Atherosclerosis of coronary artery of native heart with stable angina pectoris, unspecified vessel or lesion type (HCC) 01/23/2016   Overview:  Completely occluded circumflex artery proximal to obtuse marginal 1 basin cardiac catheter from 2017   Atherosclerosis of native coronary artery of native heart without angina pectoris 01/23/2016   Overview:  Completely occluded circumflex artery proximal to obtuse marginal 1 basin cardiac catheter from 2017   Bilateral hip pain 11/17/2021   Bladder incontinence 11/03/2022   BMI 45.0-49.9, adult (HCC) 01/13/2013   Cellulitis of right upper extremity 01/01/2022   Centrilobular emphysema (HCC) 01/16/2022   Chronic kidney disease    Chronic pain 10/19/2019   Sees pain clinic   Chronic pain syndrome 10/19/2019   Sees pain clinic     COPD (chronic obstructive pulmonary disease) (HCC)    COPD exacerbation (HCC) 10/03/2019   Coronary artery disease involving native heart 02/08/2020   Coronary atherosclerosis of native coronary artery 01/23/2016   Overview:  Completely occluded circumflex artery proximal to obtuse marginal 1 basin cardiac catheter from 2017   Depression, major, recurrent, mild (HCC) 07/11/2020    Depression, major, single episode, moderate (HCC) 07/11/2020   Diabetic polyneuropathy (HCC) 12/06/2019   ED (erectile dysfunction) 12/06/2019   Essential hypertension 01/23/2016   Essential thrombocythemia (HCC) 09/29/2011   GERD (gastroesophageal reflux disease) 11/17/2021   GERD without esophagitis    Gout 02/14/2022   HFrEF (heart failure with reduced ejection fraction) (HCC) 02/08/2020   Idiopathic chronic gout of multiple 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 Left   Leukocytosis 09/04/2011   Lumbar pain 11/17/2021   Lung nodule 10/13/2022   Mild major depression, single episode (HCC) 02/14/2022   Mixed hyperlipidemia 06/18/2017   Mixed incontinence 02/24/2022   Morbid obesity (HCC) 11/21/2020   Morbid obesity with BMI of 40.0-44.9, adult (HCC) 01/13/2013   Obstructive chronic bronchitis with exacerbation (HCC) 10/03/2019   Obstructive sleep apnea 07/18/2011   Olecranon bursitis of left elbow 02/14/2022   Olecranon bursitis of left elbow 02/14/2022   Opiate abuse, continuous (HCC)    Osteoarthritis    Pain in knee joint 02/12/2012   Formatting of this note might be different from the original. Left   Personal history of stroke with residual effects 07/24/2021   Polysubstance abuse (HCC)    Poor dentition    Primary insomnia 08/22/2023   Screening for HIV (human immunodeficiency virus) 10/13/2022   Simple chronic bronchitis (HCC) 05/15/2023   Stage 3b chronic kidney disease (HCC) 12/27/2020   Thrombocytosis 09/29/2011   Tobacco dependence 01/23/2016   Tobacco use disorder 01/23/2016   Type 2 diabetes mellitus with diabetic polyneuropathy (HCC)    Uncomplicated opioid dependence (HCC) 03/30/2022   Past Surgical History:  Procedure Laterality Date   HERNIA REPAIR  2009   I & D KNEE WITH POLY EXCHANGE  09/03/2011   Procedure: IRRIGATION AND DEBRIDEMENT KNEE WITH POLY EXCHANGE;  Surgeon:  Bevin Bucks, MD;  Location: [facility];  Service: Orthopedics;  Laterality: Left;   TOOTH EXTRACTION Bilateral 08/06/2017   Procedure: DENTAL RESTORATION/EXTRACTIONS;  Surgeon:  Ascencion Lava, DDS;  Location: [facility] OR;  Service: Oral Surgery;  Laterality: Bilateral;   TOTAL KNEE ARTHROPLASTY   right  feb 2012   left march 2012 r   TOTAL KNEE REVISION  06/23/2011   Procedure: TOTAL KNEE REVISION;  Surgeon: Bevin Bucks;  Location: [facility];  Service: Orthopedics;  Laterality: Left;  femomal nerve block done in holding area without incident    Family History  Problem Relation Age of Onset   Colon cancer Other    Colon cancer Mother    Social History   Socioeconomic History   Marital status: Widowed    Spouse name: Not on file   Number of children: 5   Years of education: Not on file   Highest education level: Not on file  Occupational History   Occupation: Disabled  Tobacco Use   Smoking status: Every Day    Current packs/day: 1.00    Average packs/day: 1 pack/day for 50.0 years (50.0 ttl pk-yrs)    Types: Cigarettes   Smokeless tobacco: Never  Vaping Use   Vaping status: Former  Substance and Sexual Activity   Alcohol  use: Not on file    Comment: heavy drinker in past. occasionally has a beer. Not on regular basis.   Drug use: Not Currently   Sexual activity: Not Currently  Other Topics Concern   Not on file  Social History Narrative   Not on file   Social Drivers of Health   Financial Resource Strain: Low Risk  (11/19/2023)   Overall Financial Resource Strain (CARDIA)    Difficulty of Paying Living Expenses: Not hard at all  Food Insecurity: No Food Insecurity (11/19/2023)   Hunger Vital Sign    Worried About Running Out of Food in the Last Year: Never true    Ran Out of Food in the Last Year: Never true  Transportation Needs: No Transportation Needs (11/19/2023)   PRAPARE - Administrator, Civil Service (Medical): No    Lack of Transportation (Non-Medical): No   Physical Activity: Inactive (11/19/2023)   Exercise Vital Sign    Days of Exercise per Week: 0 days    Minutes of Exercise per Session: 0 min  Stress: No Stress Concern Present (11/19/2023)   [employer] of Occupational Health - Occupational Stress Questionnaire    Feeling of Stress : Not at all  Social Connections: Socially Isolated (11/19/2023)   Social Connection and Isolation Panel [NHANES]    Frequency of Communication with Friends and Family: More than three times a week    Frequency of Social Gatherings with Friends and Family: More than three times a week    Attends Religious Services: Never    Database administrator or Organizations: No    Attends Banker Meetings: Never    Marital Status: Widowed    Objective:  BP 132/76   Pulse 91   Temp 98.2 F (36.8 C)   Ht 5\' 8"  (1.727 m)   Wt 289 lb (131.1 kg)   SpO2 98%   BMI 43.94 kg/m      11/19/2023    9:40 AM 08/18/2023   10:19 AM 05/11/2023   10:21 AM  BP/Weight  Systolic BP 132 130 112  Diastolic BP 76 74 78  Wt. (Lbs) 289 287 298  BMI 43.94 kg/m2 43.64 kg/m2 45.31 kg/m2    Physical Exam Vitals reviewed.  Constitutional:      Appearance: Normal appearance. He is obese.  Neck:  Vascular: No carotid bruit.  Cardiovascular:     Rate and Rhythm: Normal rate and regular rhythm.     Pulses: Normal pulses.     Heart sounds: Normal heart sounds.  Pulmonary:     Effort: Pulmonary effort is normal.     Breath sounds: Rhonchi (occasional bl lobes.) present. No wheezing or rales.  Abdominal:     General: Bowel sounds are normal.     Palpations: Abdomen is soft.     Tenderness: There is no abdominal tenderness.  Neurological:     Mental Status: He is alert and oriented to person, place, and time.  Psychiatric:        Mood and Affect: Mood normal.        Behavior: Behavior normal.     Diabetic Foot Exam - Simple   Simple Foot Form Diabetic Foot exam was performed with the following findings:  Yes 11/19/2023 10:24 AM  Visual Inspection See comments: Yes Sensation Testing Intact to touch and monofilament testing bilaterally: Yes Pulse Check Posterior Tibialis and Dorsalis pulse intact bilaterally: Yes Comments Thickened nails and long. Calluses.       Lab Results  Component Value Date   WBC 10.2 11/16/2023   HGB 16.6 11/16/2023   HCT 48.6 11/16/2023   PLT 375 11/16/2023   GLUCOSE 192 (H) 11/16/2023   CHOL 166 11/16/2023   TRIG 176 (H) 11/16/2023   HDL 34 (L) 11/16/2023   LDLCALC 101 (H) 11/16/2023   ALT 13 11/16/2023   AST 16 11/16/2023   NA 136 11/16/2023   K 4.2 11/16/2023   CL 99 11/16/2023   CREATININE 1.19 11/16/2023   BUN 11 11/16/2023   CO2 19 (L) 11/16/2023   TSH 1.140 07/09/2022   INR 1.81 (H) 09/07/2011   HGBA1C 8.4 (H) 11/16/2023   MICROALBUR 80 09/27/2020      Assessment & Plan:  Essential hypertension Assessment & Plan: Patient on spironolactone , metoprolol , and amlodipine . -Continue current medications.   Atherosclerosis of native coronary artery of native heart without angina pectoris Assessment & Plan: Continue rosuvastatin , zetia, plavix , and aspirin  81 mg daily.     Simple chronic bronchitis (HCC) Assessment & Plan: The current medical regimen is effective;  continue present plan and medications. Continue trelegy one inhalation daily.   GERD without esophagitis Assessment & Plan: Continue lansoprazole  and pepcid .    Mixed hyperlipidemia Assessment & Plan: Slightly elevated cholesterol on last labs. Patient currently on rosuvastatin  and Zetia. -Continue current medications and reassess cholesterol on next labs. - Trial on prescription fish oil.    Chronic pain syndrome Assessment & Plan: The current medical regimen is effective;  continue present plan and medications. Check UDS  Orders: -     Pain Mgt Scrn (14 Drugs), Ur  Lumbar pain Assessment & Plan: Continue celebrex  200 mg daily.  Continue oxycodone  5/325 mg  four times a day.   Orders: -     oxyCODONE -Acetaminophen ; Take 1 tablet by mouth every 6 (six) hours as needed for severe pain (pain score 7-10). TAKE ONE TABLET BY MOUTH FOUR times daily AS DIRECTEDTAKE ONE TABLET BY MOUTH FOUR times daily AS DIRECTED  Dispense: 120 tablet; Refill: 0  Uncomplicated opioid dependence (HCC) Assessment & Plan: Check UDS.   Orders: -     Pain Mgt Scrn (14 Drugs), Ur  Type 2 diabetes mellitus with diabetic polyneuropathy, with long-term current use of insulin  (HCC) Assessment & Plan: Well controlled. Recommend check sugars fasting daily. Recommend check feet daily. Recommend  annual eye exams. Continue to work on eating a healthy diet and exercise.  Labs drawn today.     Continue Lantus  30 units daily, ozempic  2 mg weekly. increase xigduo  xr 04/999 mg daily.    Orders: -     Microalbumin / creatinine urine ratio -     Dapagliflozin  Pro-metFORMIN  ER; Take 1 tablet by mouth daily.  Dispense: 90 tablet; Refill: 0  Depression, major, recurrent, mild (HCC) Assessment & Plan: Patient denies current symptoms. On sertraline . -Continue sertraline .   Primary insomnia Assessment & Plan: Patient reports good sleep with current regimen of trazodone . -Continue trazodone .  Orders: -     traZODone  HCl; TAKE 1 TABLET BY MOUTH EVERY DAY AT BEDTIME  Dispense: 90 tablet; Refill: 1  Encounter for immunization -     Pneumococcal conjugate vaccine 20-valent     Meds ordered this encounter  Medications   traZODone  (DESYREL ) 150 MG tablet    Sig: TAKE 1 TABLET BY MOUTH EVERY DAY AT BEDTIME    Dispense:  90 tablet    Refill:  1   oxyCODONE -acetaminophen  (PERCOCET/ROXICET) 5-325 MG tablet    Sig: Take 1 tablet by mouth every 6 (six) hours as needed for severe pain (pain score 7-10). TAKE ONE TABLET BY MOUTH FOUR times daily AS DIRECTEDTAKE ONE TABLET BY MOUTH FOUR times daily AS DIRECTED    Dispense:  120 tablet    Refill:  0   Dapagliflozin  Pro-metFORMIN   ER (XIGDUO  XR) 04-999 MG TB24    Sig: Take 1 tablet by mouth daily.    Dispense:  90 tablet    Refill:  0    CHANGE IN MG DOSE.    Orders Placed This Encounter  Procedures   Pneumococcal conjugate vaccine 20-valent   Pain Mgt Scrn (14 Drugs), Ur   Microalbumin / creatinine urine ratio     Follow-up: Return in about 3 months (around 02/19/2024) for chronic follow up.   I,Marla I Leal-Borjas,acting as a scribe for Mercy Stall, MD.,have documented all relevant documentation on the behalf of Mercy Stall, MD,as directed by  Mercy Stall, MD while in the presence of Mercy Stall, MD.   An After Visit Summary was printed and given to the patient.  I attest that I have reviewed this visit and agree with the plan scribed by my staff.   Mercy Stall, MD [facility] [phone]

## 2023-11-19 ENCOUNTER — Ambulatory Visit (INDEPENDENT_AMBULATORY_CARE_PROVIDER_SITE_OTHER): Payer: 59 | Admitting: Family Medicine

## 2023-11-19 ENCOUNTER — Encounter: Payer: Self-pay | Admitting: Family Medicine

## 2023-11-19 VITALS — BP 132/76 | HR 91 | Temp 98.2°F | Ht 68.0 in | Wt 289.0 lb

## 2023-11-19 DIAGNOSIS — K5903 Drug induced constipation: Secondary | ICD-10-CM

## 2023-11-19 DIAGNOSIS — F1120 Opioid dependence, uncomplicated: Secondary | ICD-10-CM

## 2023-11-19 DIAGNOSIS — M545 Low back pain, unspecified: Secondary | ICD-10-CM | POA: Diagnosis not present

## 2023-11-19 DIAGNOSIS — J410 Simple chronic bronchitis: Secondary | ICD-10-CM

## 2023-11-19 DIAGNOSIS — E782 Mixed hyperlipidemia: Secondary | ICD-10-CM | POA: Diagnosis not present

## 2023-11-19 DIAGNOSIS — G894 Chronic pain syndrome: Secondary | ICD-10-CM

## 2023-11-19 DIAGNOSIS — E1142 Type 2 diabetes mellitus with diabetic polyneuropathy: Secondary | ICD-10-CM | POA: Diagnosis not present

## 2023-11-19 DIAGNOSIS — I2510 Atherosclerotic heart disease of native coronary artery without angina pectoris: Secondary | ICD-10-CM | POA: Diagnosis not present

## 2023-11-19 DIAGNOSIS — F5101 Primary insomnia: Secondary | ICD-10-CM

## 2023-11-19 DIAGNOSIS — K219 Gastro-esophageal reflux disease without esophagitis: Secondary | ICD-10-CM

## 2023-11-19 DIAGNOSIS — I10 Essential (primary) hypertension: Secondary | ICD-10-CM | POA: Diagnosis not present

## 2023-11-19 DIAGNOSIS — F330 Major depressive disorder, recurrent, mild: Secondary | ICD-10-CM

## 2023-11-19 DIAGNOSIS — Z23 Encounter for immunization: Secondary | ICD-10-CM

## 2023-11-19 MED ORDER — DAPAGLIFLOZIN PRO-METFORMIN ER 10-1000 MG PO TB24: 1.0000 | ORAL_TABLET | Freq: Every day | ORAL | 0 refills | Status: DC

## 2023-11-19 MED ORDER — OXYCODONE-ACETAMINOPHEN 5-325 MG PO TABS: 1.0000 | ORAL_TABLET | Freq: Four times a day (QID) | ORAL | 0 refills | Status: DC | PRN

## 2023-11-19 MED ORDER — TRAZODONE HCL 150 MG PO TABS: ORAL_TABLET | ORAL | 1 refills | Status: DC

## 2023-11-19 NOTE — Patient Instructions (Addendum)
 Recommend miralax  1-2 times per day for constipation.   Diabetes: increase xigduo  xr 04/999 mg daily.   Trial on prescription fish oil.

## 2023-11-20 LAB — MICROALBUMIN / CREATININE URINE RATIO
Creatinine, Urine: 135.6 mg/dL
Microalb/Creat Ratio: 59 mg/g{creat} — ABNORMAL HIGH (ref 0–29)
Microalbumin, Urine: 80.4 ug/mL

## 2023-11-21 ENCOUNTER — Ambulatory Visit: Payer: Self-pay | Admitting: Family Medicine

## 2023-11-21 DIAGNOSIS — K5903 Drug induced constipation: Secondary | ICD-10-CM | POA: Insufficient documentation

## 2023-11-21 NOTE — Assessment & Plan Note (Addendum)
 The current medical regimen is effective;  continue present plan and medications. Continue trelegy one inhalation daily.

## 2023-11-21 NOTE — Assessment & Plan Note (Addendum)
The current medical regimen is effective;  continue present plan and medications. ?Check UDS.  ?

## 2023-11-21 NOTE — Assessment & Plan Note (Signed)
 Patient reports good sleep with current regimen of trazodone. -Continue trazodone.

## 2023-11-21 NOTE — Assessment & Plan Note (Signed)
 Patient on spironolactone, metoprolol, and amlodipine. -Continue current medications.

## 2023-11-21 NOTE — Assessment & Plan Note (Signed)
 Recommend miralax  1-2 times per day

## 2023-11-21 NOTE — Assessment & Plan Note (Signed)
Continue rosuvastatin, zetia, plavix, and aspirin 81 mg daily.   

## 2023-11-21 NOTE — Assessment & Plan Note (Addendum)
 Slightly elevated cholesterol on last labs. Patient currently on rosuvastatin  and Zetia. -Continue current medications and reassess cholesterol on next labs. - Trial on prescription fish oil.

## 2023-11-21 NOTE — Assessment & Plan Note (Signed)
 Well controlled. Recommend check sugars fasting daily. Recommend check feet daily. Recommend annual eye exams. Continue to work on eating a healthy diet and exercise.  Labs drawn today.     Continue Lantus  30 units daily, ozempic  2 mg weekly. increase xigduo  xr 04/999 mg daily.

## 2023-11-21 NOTE — Assessment & Plan Note (Signed)
Continue celebrex 200 mg daily.  Continue oxycodone 5/325 mg four times a day.

## 2023-11-21 NOTE — Assessment & Plan Note (Signed)
-   Check UDS

## 2023-11-21 NOTE — Assessment & Plan Note (Signed)
 Patient denies current symptoms. On sertraline. -Continue sertraline.

## 2023-11-22 NOTE — Assessment & Plan Note (Signed)
 Continue lansoprazole  and pepcid .

## 2023-11-24 LAB — PAIN MGT SCRN (14 DRUGS), UR
Amphetamine Scrn, Ur: NEGATIVE ng/mL
BARBITURATE SCREEN URINE: NEGATIVE ng/mL
BENZODIAZEPINE SCREEN, URINE: NEGATIVE ng/mL
Buprenorphine, Urine: NEGATIVE ng/mL
CANNABINOIDS UR QL SCN: NEGATIVE ng/mL
Cocaine (Metab) Scrn, Ur: NEGATIVE ng/mL
Creatinine(Crt), U: 145.4 mg/dL (ref 20.0–300.0)
Fentanyl, Urine: NEGATIVE pg/mL
Meperidine Screen, Urine: NEGATIVE ng/mL
Methadone Screen, Urine: NEGATIVE ng/mL
OXYCODONE+OXYMORPHONE UR QL SCN: POSITIVE ng/mL — AB
Opiate Scrn, Ur: POSITIVE ng/mL — AB
Ph of Urine: 5.7 (ref 4.5–8.9)
Phencyclidine Qn, Ur: NEGATIVE ng/mL
Propoxyphene Scrn, Ur: NEGATIVE ng/mL
Tramadol Screen, Urine: NEGATIVE ng/mL

## 2023-12-02 ENCOUNTER — Ambulatory Visit (INDEPENDENT_AMBULATORY_CARE_PROVIDER_SITE_OTHER): Admitting: Podiatry

## 2023-12-02 DIAGNOSIS — M79674 Pain in right toe(s): Secondary | ICD-10-CM | POA: Diagnosis not present

## 2023-12-02 DIAGNOSIS — M79675 Pain in left toe(s): Secondary | ICD-10-CM | POA: Diagnosis not present

## 2023-12-02 DIAGNOSIS — B351 Tinea unguium: Secondary | ICD-10-CM

## 2023-12-02 NOTE — Progress Notes (Signed)
 Subjective:  Patient ID: Brent Ortiz, male    DOB: [birth year],  MRN: [id number]  Brent Ortiz presents to clinic today for:  Chief Complaint  Patient presents with   [facility]    [facility] with out callous, last A1C was 8.4 on 5/13, takes plavix  and ASA 81  Please check the right great nail, he thinks it is ingrown. It does appear to be and I did let him know we can schedule an apt to fix it.    Patient notes nails are thick, discolored, elongated and painful in shoegear when trying to ambulate.  His last A1c was over 8.  He feels some discomfort on the right great toenail along the medial border.  Denies drainage or injury.  PCP is [name], Burleigh Carp, MD.  Past Medical History:  Diagnosis Date   Acute combined systolic and diastolic CHF, NYHA class 2 (HCC) 01/22/2020   Acute gout of left elbow 02/14/2022   Acute idiopathic gout of right wrist 12/29/2021   Alcohol  abuse    Atherosclerosis of coronary artery of native heart with stable angina pectoris, unspecified vessel or lesion type (HCC) 01/23/2016   Overview:  Completely occluded circumflex artery proximal to obtuse marginal 1 basin cardiac catheter from 2017   Atherosclerosis of native coronary artery of native heart without angina pectoris 01/23/2016   Overview:  Completely occluded circumflex artery proximal to obtuse marginal 1 basin cardiac catheter from 2017   Bilateral hip pain 11/17/2021   Bladder incontinence 11/03/2022   BMI 45.0-49.9, adult (HCC) 01/13/2013   Cellulitis of right upper extremity 01/01/2022   Centrilobular emphysema (HCC) 01/16/2022   Chronic kidney disease    Chronic pain 10/19/2019   Sees pain clinic   Chronic pain syndrome 10/19/2019   Sees pain clinic     COPD (chronic obstructive pulmonary disease) (HCC)    COPD exacerbation (HCC) 10/03/2019   Coronary artery disease involving native heart 02/08/2020   Coronary atherosclerosis of native coronary artery 01/23/2016   Overview:  Completely occluded  circumflex artery proximal to obtuse marginal 1 basin cardiac catheter from 2017   Depression, major, recurrent, mild (HCC) 07/11/2020   Depression, major, single episode, moderate (HCC) 07/11/2020   Diabetic polyneuropathy (HCC) 12/06/2019   ED (erectile dysfunction) 12/06/2019   Essential hypertension 01/23/2016   Essential thrombocythemia (HCC) 09/29/2011   GERD (gastroesophageal reflux disease) 11/17/2021   GERD without esophagitis    Gout 02/14/2022   HFrEF (heart failure with reduced ejection fraction) (HCC) 02/08/2020   Idiopathic chronic gout of multiple 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 Left   Leukocytosis 09/04/2011   Lumbar pain 11/17/2021   Lung nodule 10/13/2022   Mild major depression, single episode (HCC) 02/14/2022   Mixed hyperlipidemia 06/18/2017   Mixed incontinence 02/24/2022   Morbid obesity (HCC) 11/21/2020   Morbid obesity with BMI of 40.0-44.9, adult (HCC) 01/13/2013   Obstructive chronic bronchitis with exacerbation (HCC) 10/03/2019   Obstructive sleep apnea 07/18/2011   Olecranon bursitis of left elbow 02/14/2022   Olecranon bursitis of left elbow 02/14/2022   Opiate abuse, continuous (HCC)    Osteoarthritis    Pain in knee joint 02/12/2012   Formatting of this note might be different from the original. Left   Personal history of stroke with residual effects 07/24/2021   Polysubstance abuse (HCC)    Poor dentition    Primary insomnia 08/22/2023   Screening  for HIV (human immunodeficiency virus) 10/13/2022   Simple chronic bronchitis (HCC) 05/15/2023   Stage 3b chronic kidney disease (HCC) 12/27/2020   Thrombocytosis 09/29/2011   Tobacco dependence 01/23/2016   Tobacco use disorder 01/23/2016   Type 2 diabetes mellitus with diabetic polyneuropathy (HCC)    Uncomplicated opioid dependence (HCC) 03/30/2022   Past Surgical History:  Procedure Laterality Date    HERNIA REPAIR  2009   I & D KNEE WITH POLY EXCHANGE  09/03/2011   Procedure: IRRIGATION AND DEBRIDEMENT KNEE WITH POLY EXCHANGE;  Surgeon: Bevin Bucks, MD;  Location: [facility];  Service: Orthopedics;  Laterality: Left;   TOOTH EXTRACTION Bilateral 08/06/2017   Procedure: DENTAL RESTORATION/EXTRACTIONS;  Surgeon: Ascencion Lava, DDS;  Location: [facility] OR;  Service: Oral Surgery;  Laterality: Bilateral;   TOTAL KNEE ARTHROPLASTY   right  feb 2012   left march 2012 r   TOTAL KNEE REVISION  06/23/2011   Procedure: TOTAL KNEE REVISION;  Surgeon: Bevin Bucks;  Location: [facility];  Service: Orthopedics;  Laterality: Left;  femomal nerve block done in holding area without incident   Allergies  Allergen Reactions   Motrin  [Ibuprofen ] Other (See Comments)    Reaction: ulcers   Buprenorphine  Itching    Itching and dizziness   Other Nausea And Vomiting    Patient has ulcers    Review of Systems: Negative except as noted in the HPI.  Objective:  Brent Ortiz is a pleasant 63 y.o. male in NAD. AAO x 3.  Vascular Examination: Capillary refill time is 3-5 seconds to toes bilateral. Palpable pedal pulses b/l LE. Digital hair sparse b/l.  Skin temperature gradient WNL b/l.  +1 pitting edema in legs and ankles bilateral  Dermatological Examination: Pedal skin with increased turgor, texture and tone b/l. No open wounds. No interdigital macerations b/l. Toenails x10 are 4-64mm thick, discolored, dystrophic with subungual debris. There is pain with compression of the nail plates.  They are elongated x10     Latest Ref Rng & Units 11/16/2023    9:58 AM 08/18/2023   11:09 AM 05/11/2023   11:18 AM 02/01/2023   11:03 AM  Hemoglobin A1C  Hemoglobin-A1c 4.8 - 5.6 % 8.4  9.0  9.2  8.0    Assessment/Plan: 1. Pain due to onychomycosis of toenails of both feet     The mycotic toenails were sharply debrided x10 with sterile nail nippers and a power debriding burr to decrease bulk/thickness and length.  The medial  border of the right great toenail was cut back slightly.  Following debridement, he noted immediate improvement of his symptoms.  Briefly discussed an ingrown toenail procedure if he has continued pain.  Return in about 3 months (around 03/03/2024) for [facility].   Joe Murders, DPM, FACFAS [facility]     [address] [place], [place] [address]                Office [phone]  Fax [phone]

## 2023-12-06 ENCOUNTER — Other Ambulatory Visit: Payer: Self-pay | Admitting: Family Medicine

## 2023-12-07 DIAGNOSIS — J449 Chronic obstructive pulmonary disease, unspecified: Secondary | ICD-10-CM | POA: Diagnosis not present

## 2023-12-10 DIAGNOSIS — G894 Chronic pain syndrome: Secondary | ICD-10-CM | POA: Diagnosis not present

## 2023-12-15 ENCOUNTER — Ambulatory Visit: Admitting: Family Medicine

## 2023-12-15 ENCOUNTER — Ambulatory Visit: Payer: Self-pay

## 2023-12-15 DIAGNOSIS — I2510 Atherosclerotic heart disease of native coronary artery without angina pectoris: Secondary | ICD-10-CM | POA: Diagnosis not present

## 2023-12-15 DIAGNOSIS — E118 Type 2 diabetes mellitus with unspecified complications: Secondary | ICD-10-CM | POA: Diagnosis not present

## 2023-12-15 DIAGNOSIS — E039 Hypothyroidism, unspecified: Secondary | ICD-10-CM | POA: Diagnosis not present

## 2023-12-15 DIAGNOSIS — I509 Heart failure, unspecified: Secondary | ICD-10-CM | POA: Diagnosis not present

## 2023-12-15 DIAGNOSIS — J9690 Respiratory failure, unspecified, unspecified whether with hypoxia or hypercapnia: Secondary | ICD-10-CM | POA: Diagnosis not present

## 2023-12-15 DIAGNOSIS — I444 Left anterior fascicular block: Secondary | ICD-10-CM | POA: Diagnosis not present

## 2023-12-15 DIAGNOSIS — I10 Essential (primary) hypertension: Secondary | ICD-10-CM | POA: Diagnosis not present

## 2023-12-15 DIAGNOSIS — I4510 Unspecified right bundle-branch block: Secondary | ICD-10-CM | POA: Diagnosis not present

## 2023-12-15 DIAGNOSIS — Z886 Allergy status to analgesic agent status: Secondary | ICD-10-CM | POA: Diagnosis not present

## 2023-12-15 DIAGNOSIS — I252 Old myocardial infarction: Secondary | ICD-10-CM | POA: Diagnosis not present

## 2023-12-15 DIAGNOSIS — J432 Centrilobular emphysema: Secondary | ICD-10-CM | POA: Diagnosis not present

## 2023-12-15 DIAGNOSIS — M109 Gout, unspecified: Secondary | ICD-10-CM | POA: Diagnosis not present

## 2023-12-15 DIAGNOSIS — R0602 Shortness of breath: Secondary | ICD-10-CM | POA: Diagnosis not present

## 2023-12-15 DIAGNOSIS — G4733 Obstructive sleep apnea (adult) (pediatric): Secondary | ICD-10-CM | POA: Diagnosis not present

## 2023-12-15 DIAGNOSIS — Z743 Need for continuous supervision: Secondary | ICD-10-CM | POA: Diagnosis not present

## 2023-12-15 DIAGNOSIS — F17210 Nicotine dependence, cigarettes, uncomplicated: Secondary | ICD-10-CM | POA: Diagnosis not present

## 2023-12-15 DIAGNOSIS — I249 Acute ischemic heart disease, unspecified: Secondary | ICD-10-CM | POA: Diagnosis not present

## 2023-12-15 DIAGNOSIS — R079 Chest pain, unspecified: Secondary | ICD-10-CM | POA: Diagnosis not present

## 2023-12-15 DIAGNOSIS — J9620 Acute and chronic respiratory failure, unspecified whether with hypoxia or hypercapnia: Secondary | ICD-10-CM | POA: Diagnosis not present

## 2023-12-15 DIAGNOSIS — Z8711 Personal history of peptic ulcer disease: Secondary | ICD-10-CM | POA: Diagnosis not present

## 2023-12-15 DIAGNOSIS — M1990 Unspecified osteoarthritis, unspecified site: Secondary | ICD-10-CM | POA: Diagnosis not present

## 2023-12-15 DIAGNOSIS — K219 Gastro-esophageal reflux disease without esophagitis: Secondary | ICD-10-CM | POA: Diagnosis not present

## 2023-12-15 DIAGNOSIS — I4519 Other right bundle-branch block: Secondary | ICD-10-CM | POA: Diagnosis not present

## 2023-12-15 DIAGNOSIS — Z8673 Personal history of transient ischemic attack (TIA), and cerebral infarction without residual deficits: Secondary | ICD-10-CM | POA: Diagnosis not present

## 2023-12-15 DIAGNOSIS — Z87442 Personal history of urinary calculi: Secondary | ICD-10-CM | POA: Diagnosis not present

## 2023-12-15 DIAGNOSIS — Z9981 Dependence on supplemental oxygen: Secondary | ICD-10-CM | POA: Diagnosis not present

## 2023-12-15 DIAGNOSIS — R58 Hemorrhage, not elsewhere classified: Secondary | ICD-10-CM | POA: Diagnosis not present

## 2023-12-15 DIAGNOSIS — R0600 Dyspnea, unspecified: Secondary | ICD-10-CM | POA: Diagnosis not present

## 2023-12-15 DIAGNOSIS — J449 Chronic obstructive pulmonary disease, unspecified: Secondary | ICD-10-CM | POA: Diagnosis not present

## 2023-12-15 DIAGNOSIS — I130 Hypertensive heart and chronic kidney disease with heart failure and stage 1 through stage 4 chronic kidney disease, or unspecified chronic kidney disease: Secondary | ICD-10-CM | POA: Diagnosis not present

## 2023-12-15 DIAGNOSIS — Z87440 Personal history of urinary (tract) infections: Secondary | ICD-10-CM | POA: Diagnosis not present

## 2023-12-15 DIAGNOSIS — E7800 Pure hypercholesterolemia, unspecified: Secondary | ICD-10-CM | POA: Diagnosis not present

## 2023-12-15 DIAGNOSIS — Z7984 Long term (current) use of oral hypoglycemic drugs: Secondary | ICD-10-CM | POA: Diagnosis not present

## 2023-12-15 DIAGNOSIS — R918 Other nonspecific abnormal finding of lung field: Secondary | ICD-10-CM | POA: Diagnosis not present

## 2023-12-15 DIAGNOSIS — Z720 Tobacco use: Secondary | ICD-10-CM | POA: Diagnosis not present

## 2023-12-15 DIAGNOSIS — Z794 Long term (current) use of insulin: Secondary | ICD-10-CM | POA: Diagnosis not present

## 2023-12-15 DIAGNOSIS — E1122 Type 2 diabetes mellitus with diabetic chronic kidney disease: Secondary | ICD-10-CM | POA: Diagnosis not present

## 2023-12-15 DIAGNOSIS — I25110 Atherosclerotic heart disease of native coronary artery with unstable angina pectoris: Secondary | ICD-10-CM | POA: Diagnosis not present

## 2023-12-15 DIAGNOSIS — N189 Chronic kidney disease, unspecified: Secondary | ICD-10-CM | POA: Diagnosis not present

## 2023-12-15 NOTE — Telephone Encounter (Signed)
 FYI Only or Action Required?: FYI only for provider  Patient was last seen in primary care on 11/19/2023 by Mercy Stall, MD. Called Nurse Triage reporting No chief complaint on file.. Symptoms began several days ago. Interventions attempted: Prescription medications: inhaler. Symptoms are: gradually worsening.  Triage Disposition: See HCP Within 4 Hours (Or PCP Triage)  Patient/caregiver understands and will follow disposition?: Yes        Copied from CRM [id number]. Topic: Clinical - Red Word Triage >> Dec 15, 2023 10:19 AM Zipporah Him wrote: Red Word that prompted transfer to Nurse Triage: Patient has been sick for the last 3-4 days, hard for him to breathe and really bad nose bleed last night and again this morning. Lots of mucus and feeling terrible Reason for Disposition  [1] MILD difficulty breathing (e.g., minimal/no SOB at rest, SOB with walking, pulse <100) AND [2] still present when not coughing  Answer Assessment - Initial Assessment Questions 1. ONSET: When did the cough begin?      sunday 2. SEVERITY: How bad is the cough today?      Mod-severe 3. SPUTUM: Describe the color of your sputum (none, dry cough; clear, white, yellow, green)     yellow 4. HEMOPTYSIS: Are you coughing up any blood? If so ask: How much? (flecks, streaks, tablespoons, etc.)     no 5. DIFFICULTY BREATHING: Are you having difficulty breathing? If Yes, ask: How bad is it? (e.g., mild, moderate, severe)    - MILD: No SOB at rest, mild SOB with walking, speaks normally in sentences, can lie down, no retractions, pulse < 100.    - MODERATE: SOB at rest, SOB with minimal exertion and prefers to sit, cannot lie down flat, speaks in phrases, mild retractions, audible wheezing, pulse 100-120.    - SEVERE: Very SOB at rest, speaks in single words, struggling to breathe, sitting hunched forward, retractions, pulse > 120      Mild-mod 6. FEVER: Do you have a fever? If Yes, ask: What is your  temperature, how was it measured, and when did it start?     no 7. CARDIAC HISTORY: Do you have any history of heart disease? (e.g., heart attack, congestive heart failure)      no 8. LUNG HISTORY: Do you have any history of lung disease?  (e.g., pulmonary embolus, asthma, emphysema)    copd 9. PE RISK FACTORS: Do you have a history of blood clots? (or: recent major surgery, recent prolonged travel, bedridden)     no 10. OTHER SYMPTOMS: Do you have any other symptoms? (e.g., runny nose, wheezing, chest pain)       Nose bleed, wheezing,  Protocols used: Cough - Acute Non-Productive-A-AH

## 2023-12-16 ENCOUNTER — Telehealth: Payer: Self-pay

## 2023-12-16 DIAGNOSIS — I4519 Other right bundle-branch block: Secondary | ICD-10-CM | POA: Diagnosis not present

## 2023-12-16 DIAGNOSIS — E118 Type 2 diabetes mellitus with unspecified complications: Secondary | ICD-10-CM | POA: Diagnosis not present

## 2023-12-16 DIAGNOSIS — I2510 Atherosclerotic heart disease of native coronary artery without angina pectoris: Secondary | ICD-10-CM | POA: Diagnosis not present

## 2023-12-16 DIAGNOSIS — I249 Acute ischemic heart disease, unspecified: Secondary | ICD-10-CM | POA: Diagnosis not present

## 2023-12-16 DIAGNOSIS — R079 Chest pain, unspecified: Secondary | ICD-10-CM | POA: Diagnosis not present

## 2023-12-16 DIAGNOSIS — Z720 Tobacco use: Secondary | ICD-10-CM

## 2023-12-16 DIAGNOSIS — J449 Chronic obstructive pulmonary disease, unspecified: Secondary | ICD-10-CM | POA: Diagnosis not present

## 2023-12-16 DIAGNOSIS — R0600 Dyspnea, unspecified: Secondary | ICD-10-CM | POA: Diagnosis not present

## 2023-12-16 DIAGNOSIS — I10 Essential (primary) hypertension: Secondary | ICD-10-CM | POA: Diagnosis not present

## 2023-12-16 NOTE — Telephone Encounter (Signed)
 Done  Copied from CRM [id number]. Topic: Clinical - Medication Question >> Dec 16, 2023  3:21 PM Virgia Griffins wrote: Nira Basset from [facility] requesting pt medication list pt Admitted today 12/16/2023  Please call [phone] Fax [phone].

## 2023-12-17 ENCOUNTER — Encounter (HOSPITAL_COMMUNITY): Admission: EM | Disposition: A | Discharge status: Home / Self Care | Payer: Self-pay | Source: Home / Self Care

## 2023-12-17 ENCOUNTER — Observation Stay (HOSPITAL_COMMUNITY)
Admission: EM | Admit: 2023-12-17 | Discharge: 2023-12-17 | Disposition: A | Discharge status: Home / Self Care | Attending: Cardiovascular Disease | Admitting: Cardiovascular Disease

## 2023-12-17 DIAGNOSIS — I2510 Atherosclerotic heart disease of native coronary artery without angina pectoris: Secondary | ICD-10-CM | POA: Insufficient documentation

## 2023-12-17 DIAGNOSIS — E1142 Type 2 diabetes mellitus with diabetic polyneuropathy: Secondary | ICD-10-CM | POA: Insufficient documentation

## 2023-12-17 DIAGNOSIS — E118 Type 2 diabetes mellitus with unspecified complications: Secondary | ICD-10-CM | POA: Diagnosis not present

## 2023-12-17 DIAGNOSIS — I5032 Chronic diastolic (congestive) heart failure: Secondary | ICD-10-CM | POA: Insufficient documentation

## 2023-12-17 DIAGNOSIS — Z8673 Personal history of transient ischemic attack (TIA), and cerebral infarction without residual deficits: Secondary | ICD-10-CM | POA: Insufficient documentation

## 2023-12-17 DIAGNOSIS — Z7901 Long term (current) use of anticoagulants: Secondary | ICD-10-CM | POA: Insufficient documentation

## 2023-12-17 DIAGNOSIS — Z7984 Long term (current) use of oral hypoglycemic drugs: Secondary | ICD-10-CM | POA: Insufficient documentation

## 2023-12-17 DIAGNOSIS — E1122 Type 2 diabetes mellitus with diabetic chronic kidney disease: Secondary | ICD-10-CM | POA: Insufficient documentation

## 2023-12-17 DIAGNOSIS — I10 Essential (primary) hypertension: Secondary | ICD-10-CM | POA: Diagnosis not present

## 2023-12-17 DIAGNOSIS — M109 Gout, unspecified: Secondary | ICD-10-CM | POA: Insufficient documentation

## 2023-12-17 DIAGNOSIS — I249 Acute ischemic heart disease, unspecified: Secondary | ICD-10-CM | POA: Diagnosis not present

## 2023-12-17 DIAGNOSIS — J449 Chronic obstructive pulmonary disease, unspecified: Secondary | ICD-10-CM | POA: Insufficient documentation

## 2023-12-17 DIAGNOSIS — R9439 Abnormal result of other cardiovascular function study: Principal | ICD-10-CM | POA: Insufficient documentation

## 2023-12-17 DIAGNOSIS — Z720 Tobacco use: Secondary | ICD-10-CM | POA: Diagnosis not present

## 2023-12-17 DIAGNOSIS — N1832 Chronic kidney disease, stage 3b: Secondary | ICD-10-CM | POA: Insufficient documentation

## 2023-12-17 DIAGNOSIS — Z951 Presence of aortocoronary bypass graft: Secondary | ICD-10-CM | POA: Insufficient documentation

## 2023-12-17 DIAGNOSIS — F17210 Nicotine dependence, cigarettes, uncomplicated: Secondary | ICD-10-CM | POA: Insufficient documentation

## 2023-12-17 DIAGNOSIS — K219 Gastro-esophageal reflux disease without esophagitis: Secondary | ICD-10-CM | POA: Insufficient documentation

## 2023-12-17 DIAGNOSIS — E785 Hyperlipidemia, unspecified: Secondary | ICD-10-CM | POA: Insufficient documentation

## 2023-12-17 DIAGNOSIS — Z79899 Other long term (current) drug therapy: Secondary | ICD-10-CM | POA: Insufficient documentation

## 2023-12-17 DIAGNOSIS — I130 Hypertensive heart and chronic kidney disease with heart failure and stage 1 through stage 4 chronic kidney disease, or unspecified chronic kidney disease: Secondary | ICD-10-CM | POA: Insufficient documentation

## 2023-12-17 LAB — GLUCOSE, CAPILLARY: Glucose-Capillary: 271 mg/dL — ABNORMAL HIGH (ref 70–99)

## 2023-12-17 SURGERY — LEFT HEART CATH AND CORS/GRAFTS ANGIOGRAPHY
Anesthesia: LOCAL

## 2023-12-17 MED ORDER — SODIUM CHLORIDE 0.9 % IV SOLN: INTRAVENOUS | Status: DC

## 2023-12-17 MED ORDER — VERAPAMIL HCL 2.5 MG/ML IV SOLN
INTRAVENOUS | Status: AC
  Filled 2023-12-17: qty 2

## 2023-12-17 MED ORDER — ONDANSETRON HCL 4 MG/2ML IJ SOLN: 4.0000 mg | Freq: Four times a day (QID) | INTRAMUSCULAR | Status: DC | PRN

## 2023-12-17 MED ORDER — ASPIRIN 81 MG PO CHEW
81.0000 mg | CHEWABLE_TABLET | ORAL | Status: AC
Start: 2023-12-18 — End: 2023-12-17
  Administered 2023-12-17: 81 mg via ORAL

## 2023-12-17 MED ORDER — ASPIRIN 81 MG PO CHEW
CHEWABLE_TABLET | ORAL | Status: AC
  Filled 2023-12-17: qty 1

## 2023-12-17 MED ORDER — ACETAMINOPHEN 325 MG PO TABS: 650.0000 mg | ORAL_TABLET | ORAL | Status: DC | PRN

## 2023-12-17 MED ORDER — LIDOCAINE HCL (PF) 1 % IJ SOLN
INTRAMUSCULAR | Status: AC
  Filled 2023-12-17: qty 30

## 2023-12-17 MED ORDER — NITROGLYCERIN 0.4 MG SL SUBL: 0.4000 mg | SUBLINGUAL_TABLET | SUBLINGUAL | Status: DC | PRN

## 2023-12-17 NOTE — Progress Notes (Signed)
 Pt to discharge from [facility] per Dr. Abel Hoe. Pt will not receive scheduled cath as not indicated per MD. Discharge orders in from [place], [place]. Pt's daughter to intercept pt upon discharge and drive him home. Pt aware and agreeable. VSS. All belongings returned at this time.

## 2023-12-17 NOTE — Discharge Summary (Signed)
 Discharge Summary   Patient ID: Brent Ortiz MRN: [id number]; DOB: 03/08/1961  Admit date: 12/17/2023 Discharge date: 12/17/2023  PCP:  Mercy Stall, MD   [facility] Providers Cardiologist:  None     Discharge Diagnoses  Principal Problem:   Abnormal stress test Active Problems:   Essential hypertension   Coronary artery disease involving native heart   Stage 3b chronic kidney disease (HCC)   GERD (gastroesophageal reflux disease)   Type 2 diabetes mellitus with diabetic polyneuropathy (HCC)   COPD (chronic obstructive pulmonary disease) (HCC)  Diagnostic Studies/Procedures  Echocardiogram, 12/16/2023 [facility]) Left ventricle: Left ventricular chamber size is normal.  There is severely decreased LV wall thickness.  There is normal diastolic function. Wall motion: Globally normal Ejection fraction: LVEF is 60 to 65% RV: Right ventricle is mildly enlarged.  Right ventricular global systolic function is normal Left atrium: Left atrium is normal in size Right atrium: Right atrium is normal in size Mitral valve: Mitral valve appears structurally normal.  There is an annual mitral valve calcification.  There is no evidence of mitral stenosis.  There is trivial mitral regurg. Aortic valve: Aortic valve suboptimal visualization, appears trileaflet.  There is moderate aortic sclerosis without evidence of stenosis.  There is no evidence of aortic regurgitation Pulmonic valve: Pulmonic valve is not well visualized.  There is no evidence of pulmonic valve stenosis.  There is trace pulmonic valve regurgitation. Tricuspid valve: Tricuspid valve appears structurally normal.  There is no evidence of tricuspid stenosis. Pressures: Right ventricular systolic pressure cannot be estimated on the study Venous: IVC not well-seen.  Pulmonary veins not well-visualized Pericardium: Pericardium appears normal.  There is no pericardial effusion visualized Aorta: Ascending aorta is  mildly dilated.  The descending aorta is poorly visualized. Other: There is no pleural effusion present.  No obvious atrial septal defect noted by color Doppler.   Conclusions: There is severely increased LV wall thickness: LVEF 60 to 65%   Nuclear stress test, 12/15/2023 [facility]) Small mild defect involving basal and midportion of the inferior lateral wall Normal gated images Normal ejection fraction calculated of 66% _____________   History of Present Illness   Brent Ortiz is a 63 y.o. male with past medical history of CAD s/p CABG at age 37, hypertension, hyperlipidemia, diabetes, CHF, COPD on 2 L of oxygen  at baseline, history of cocaine abuse, history of stroke, CKD who is being seen 12/17/2023 from [facility] after an abnormal stress test, dyspnea to undergo cardiac catheterization with possible percutaneous intervention.   Hospital Course   When he presented to [facility] hospital holding area prior to his cath he was seen by Dr. Abel Hoe who determined the patient was not an appropriate candidate to undergo LHC. He had presented to [facility] for confusion, prolonged epistaxis and an eye infection. While he was there he had a normal EKG, echo, negative troponin x 3 and a mildly abnormal nuclear stress test.   Patient denies any true chest pain. Stated that when he did have any chest pain it was intermittent and has been occurring since 2012. He presented primarily with the concerns listed above. After being seen by Dr. Abel Hoe he determined that it would not be appropriate to have this patient undergo cardiac catheterization.  MD discussed this with the patient who was agreeable to the plan. He then contacted the patients daughter to inform her of the plan. Procedure was subsequently canceled.     Did the patient have an  acute coronary syndrome (MI, NSTEMI, STEMI, etc) this admission?:  No                               Did the patient have a percutaneous  coronary intervention (stent / angioplasty)?:  No.     _____________  Discharge Vitals Blood pressure 116/65, pulse 88, resp. rate 20, height 5' 8 (1.727 m), weight 130.2 kg, SpO2 93%.  Filed Weights   12/17/23 1349  Weight: 130.2 kg   Labs & Radiologic Studies  CBC No results for input(s): WBC, NEUTROABS, HGB, HCT, MCV, PLT in the last 72 hours. Basic Metabolic Panel No results for input(s): NA, K, CL, CO2, GLUCOSE, BUN, CREATININE, CALCIUM , MG, PHOS in the last 72 hours. Liver Function Tests No results for input(s): AST, ALT, ALKPHOS, BILITOT, PROT, ALBUMIN in the last 72 hours. No results for input(s): LIPASE, AMYLASE in the last 72 hours. High Sensitivity Troponin:   No results for input(s): TROPONINIHS in the last 720 hours.  No results for input(s): TRNPT in the last 720 hours.  BNP Invalid input(s): POCBNP No results for input(s): PROBNP in the last 72 hours.  No results for input(s): BNP in the last 72 hours.  D-Dimer No results for input(s): DDIMER in the last 72 hours. Hemoglobin A1C No results for input(s): HGBA1C in the last 72 hours. Fasting Lipid Panel No results for input(s): CHOL, HDL, LDLCALC, TRIG, CHOLHDL, LDLDIRECT in the last 72 hours. No results found for: LIPOA  Thyroid  Function Tests No results for input(s): TSH, T4TOTAL, T3FREE, THYROIDAB in the last 72 hours.  Invalid input(s): FREET3 _____________  No results found.  Disposition Pt is being discharged home today in good condition per MD.  Follow-up Plans & Appointments  Patient to follow up with primary cardiologist and PCP   Discharge Medications Allergies as of 12/17/2023       Reactions   Motrin  [ibuprofen ] Other (See Comments)   Reaction: ulcers   Buprenorphine  Itching   Itching and dizziness   Other Nausea And Vomiting   Patient has ulcers        Medication List     TAKE these medications     Accu-Chek Guide test strip Generic drug: glucose blood Use as instructed   Accu-Chek Guide w/Device Kit 1 each by Does not apply route in the morning, at noon, and at bedtime.   Accu-Chek Softclix Lancets lancets Use as instructed   albuterol  108 (90 Base) MCG/ACT inhaler Commonly known as: VENTOLIN  HFA INHALE TWO (2) PUFFS BY MOUTH EVERY 4 HOURS AS NEEDED FOR SHORTNESS OF BREATH/WHEEZING   allopurinol  300 MG tablet Commonly known as: ZYLOPRIM  TAKE 1 TABLET BY MOUTH ONCE DAILY   amLODipine  5 MG tablet Commonly known as: NORVASC  TAKE 1 TABLET BY MOUTH ONCE DAILY   ammonium lactate  12 % lotion Commonly known as: LAC-HYDRIN  Apply 1 Application topically as needed for dry skin.   aspirin  EC 81 MG tablet Commonly known as: Aspirin  Low Dose TAKE 1 TABLET BY MOUTH ONCE DAILY *SWALLOW WHOLE*   clopidogrel  75 MG tablet Commonly known as: PLAVIX  TAKE 1 TABLET BY MOUTH ONCE DAILY *AUROBINDO BRAND ONLY*   Dapagliflozin  Pro-metFORMIN  ER 04-999 MG Tb24 Commonly known as: Xigduo  XR Take 1 tablet by mouth daily.   Entresto  24-26 MG Generic drug: sacubitril -valsartan  TAKE ONE (1) TABLET BY MOUTH TWICE DAILY   famotidine  20 MG tablet Commonly known as: PEPCID  TAKE 1 TABLET BY MOUTH  TWICE DAILY   gabapentin  300 MG capsule Commonly known as: NEURONTIN  TAKE ONE (1) CAPSULE BY MOUTH TWICE DAILY   Glucagon Emergency 1 MG Kit Inject 1 mg into the muscle as needed (BG less than 70).   isosorbide  mononitrate 60 MG 24 hr tablet Commonly known as: IMDUR  TAKE 1 TABLET BY MOUTH ONCE DAILY   lansoprazole  30 MG capsule Commonly known as: PREVACID  TAKE 1 CAPSULE BY MOUTH AT NOON   Lantus  SoloStar 100 UNIT/ML Solostar Pen Generic drug: insulin  glargine INJECT 30 UNITS SUBCUTANEOUSLY ONCE DAILY   metoprolol  succinate 25 MG 24 hr tablet Commonly known as: TOPROL -XL TAKE 1 TABLET BY MOUTH ONCE DAILY   oxyCODONE -acetaminophen  5-325 MG tablet Commonly known as:  PERCOCET/ROXICET Take 1 tablet by mouth every 6 (six) hours as needed for severe pain (pain score 7-10). TAKE ONE TABLET BY MOUTH FOUR times daily AS DIRECTEDTAKE ONE TABLET BY MOUTH FOUR times daily AS DIRECTED   ranolazine  500 MG 12 hr tablet Commonly known as: RANEXA  TAKE 1 TABLET BY MOUTH TWICE DAILY   Repatha  SureClick 140 MG/ML Soaj Generic drug: Evolocumab  INJECT 140 MG INTO THE SKIN EVERY 14 (FOURTEEN) DAYS.   rosuvastatin  40 MG tablet Commonly known as: CRESTOR  TAKE 1 TABLET BY MOUTH EVERY EVENING   Semaglutide  (2 MG/DOSE) 8 MG/3ML Sopn Inject 2 mg as directed once a week.   sertraline  25 MG tablet Commonly known as: ZOLOFT  TAKE 1 TABLET BY MOUTH ONCE DAILY   spironolactone  25 MG tablet Commonly known as: ALDACTONE  TAKE 1/2 TABLET BY MOUTH ONCE DAILY   Sure Comfort Pen Needles 31G X 8 MM Misc Generic drug: Insulin  Pen Needle USE AS DIRECTED WITH LANTUS  INSULIN  ONCE DAILY   traZODone  150 MG tablet Commonly known as: DESYREL  TAKE 1 TABLET BY MOUTH EVERY DAY AT BEDTIME        Duration of Discharge Encounter: APP Time: 25 minutes   Signed, Jiles Mote, PA-C 12/17/2023, 3:03 PM

## 2023-12-17 NOTE — H&P (Addendum)
 Cardiology Admission History and Physical   Patient ID: Brent Ortiz MRN: [id number]; DOB: 1960/08/06   Admission date: 12/17/2023  PCP:  Mercy Stall, MD   [facility] Providers Cardiologist:  None     Chief Complaint: Dyspnea, abnormal stress test  Patient Profile: Brent Ortiz is a 63 y.o. male with past medical history of CAD s/p CABG at age 56, hypertension, hyperlipidemia, diabetes, CHF, COPD on 2 L of oxygen  at baseline, history of cocaine abuse, history of stroke, CKD who is being seen 12/17/2023 from [facility] after an abnormal stress test, dyspnea to undergo cardiac catheterization with possible percutaneous intervention.  History of Present Illness: Brent Ortiz has past medical history as stated above.  He presented to [facility] on 12/15/2023 complaining of dyspnea.  He states that the shortness of breath has been ongoing for the last 2 to 3 days, with worsening confusion.  He also reported that he developed epistaxis that took around 15 minutes to resolve.  During this time he also reported that he had intermittent chest pain.  While in the emergency department over at [facility] he tested negative for RSV, COVID, influenza.  CT chest showed scattered infiltrates/scarring.  EKG in the emergency department showed sinus rhythm, heart rate 90, left axis deviation, incomplete right bundle branch block however there were no acute ischemic changes noted.  Troponin level was negative x 3.  While admitted to [facility] he underwent an echocardiogram as well as a nuclear stress test.  It was noted that this patient has undergone multiple cardiac catheterizations prior, however it had been 10 years since his last cath.  It was noted that the CT scan that was done of his chest showed significant coronary artery calcification/atherosclerosis involving all 3 coronary arteries as well as the ascending aorta/thoracic aorta.  Patient presented with a  mildly elevated creatinine at 1.5 therefore was given IV hydration while at [facility] in preparation for cardiac cath.  He was hemodynamically stable with some sinus tachycardia.  He was given ASA 325 mg then switch to 81 mg daily, he was placed on metoprolol  25 mg daily, which was one of his home meds.  He was continued on Crestor  40 mg daily also home med.  Nuclear stress test that was conducted at [facility] showed small mild defect involving basal and midportion of the inferior lateral wall, normal gated images, normal EF of 66%.  Transthoracic echocardiogram that was conducted at [facility] shows LVEF of 60 to 65%, mildly enlarged RV, severely increased LV wall thickness, no significant valvular disorders, normal pericardium, mildly dilated ascending aorta.  It appears via chart review and dispense report that the patient's home medications include: Allopurinol  300 mg daily, amlodipine  5 mg daily, Repatha  every 2 weeks, Imdur  60 mg daily, Toprol  25 mg daily, Crestor  40 mg daily, Ranexa  500 mg BID, Entresto  24-26 mg BID, spironolactone  12.5 mg daily, Xigduo  04-999 mg daily, semaglutide  once weekly, lansoprazole  30 mg daily, Plavix  75 mg daily, ASA 81 mg daily, gabapentin  300 mg BID.  Will be important to confirm accuracy of these medications with patient, as they are not in our EMR.  Patient was sent directly from [facility] to [facility] to undergo cardiac catheterization.  He has been NPO since midnight.  Arrived in the Cath Lab holding area, waiting to be seen by MD.  Past Medical History:  Diagnosis Date   Acute combined systolic and diastolic CHF, NYHA class 2 (HCC) 01/22/2020  Acute gout of left elbow 02/14/2022   Acute idiopathic gout of right wrist 12/29/2021   Alcohol  abuse    Atherosclerosis of coronary artery of native heart with stable angina pectoris, unspecified vessel or lesion type (HCC) 01/23/2016   Overview:  Completely occluded circumflex  artery proximal to obtuse marginal 1 basin cardiac catheter from 2017   Atherosclerosis of native coronary artery of native heart without angina pectoris 01/23/2016   Overview:  Completely occluded circumflex artery proximal to obtuse marginal 1 basin cardiac catheter from 2017   Bilateral hip pain 11/17/2021   Bladder incontinence 11/03/2022   BMI 45.0-49.9, adult (HCC) 01/13/2013   Cellulitis of right upper extremity 01/01/2022   Centrilobular emphysema (HCC) 01/16/2022   Chronic kidney disease    Chronic pain 10/19/2019   Sees pain clinic   Chronic pain syndrome 10/19/2019   Sees pain clinic     COPD (chronic obstructive pulmonary disease) (HCC)    COPD exacerbation (HCC) 10/03/2019   Coronary artery disease involving native heart 02/08/2020   Coronary atherosclerosis of native coronary artery 01/23/2016   Overview:  Completely occluded circumflex artery proximal to obtuse marginal 1 basin cardiac catheter from 2017   Depression, major, recurrent, mild (HCC) 07/11/2020   Depression, major, single episode, moderate (HCC) 07/11/2020   Diabetic polyneuropathy (HCC) 12/06/2019   ED (erectile dysfunction) 12/06/2019   Essential hypertension 01/23/2016   Essential thrombocythemia (HCC) 09/29/2011   GERD (gastroesophageal reflux disease) 11/17/2021   GERD without esophagitis    Gout 02/14/2022   HFrEF (heart failure with reduced ejection fraction) (HCC) 02/08/2020   Idiopathic chronic gout of multiple 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 Left   Leukocytosis 09/04/2011   Lumbar pain 11/17/2021   Lung nodule 10/13/2022   Mild major depression, single episode (HCC) 02/14/2022   Mixed hyperlipidemia 06/18/2017   Mixed incontinence 02/24/2022   Morbid obesity (HCC) 11/21/2020   Morbid obesity with BMI of 40.0-44.9, adult (HCC) 01/13/2013   Obstructive chronic bronchitis with exacerbation (HCC)  10/03/2019   Obstructive sleep apnea 07/18/2011   Olecranon bursitis of left elbow 02/14/2022   Olecranon bursitis of left elbow 02/14/2022   Opiate abuse, continuous (HCC)    Osteoarthritis    Pain in knee joint 02/12/2012   Formatting of this note might be different from the original. Left   Personal history of stroke with residual effects 07/24/2021   Polysubstance abuse (HCC)    Poor dentition    Primary insomnia 08/22/2023   Screening for HIV (human immunodeficiency virus) 10/13/2022   Simple chronic bronchitis (HCC) 05/15/2023   Stage 3b chronic kidney disease (HCC) 12/27/2020   Thrombocytosis 09/29/2011   Tobacco dependence 01/23/2016   Tobacco use disorder 01/23/2016   Type 2 diabetes mellitus with diabetic polyneuropathy (HCC)    Uncomplicated opioid dependence (HCC) 03/30/2022   Past Surgical History:  Procedure Laterality Date   HERNIA REPAIR  2009   I & D KNEE WITH POLY EXCHANGE  09/03/2011   Procedure: IRRIGATION AND DEBRIDEMENT KNEE WITH POLY EXCHANGE;  Surgeon: Bevin Bucks, MD;  Location: [facility]S;  Service: Orthopedics;  Laterality: Left;   TOOTH EXTRACTION Bilateral 08/06/2017   Procedure: DENTAL RESTORATION/EXTRACTIONS;  Surgeon: Ascencion Lava, DDS;  Location: [facility] OR;  Service: Oral Surgery;  Laterality: Bilateral;   TOTAL KNEE ARTHROPLASTY   right  feb 2012   left march 2012 r   TOTAL KNEE REVISION  06/23/2011   Procedure: TOTAL KNEE REVISION;  Surgeon: Bevin Bucks;  Location: [facility]S;  Service: Orthopedics;  Laterality: Left;  femomal nerve block done in holding area without incident    Medications Prior to Admission: Prior to Admission medications   Medication Sig Start Date End Date Taking? Authorizing Provider  Accu-Chek Softclix Lancets lancets Use as instructed 07/28/21   Audie Bleacher, MD  albuterol  (VENTOLIN  HFA) 108 (90 Base) MCG/ACT inhaler INHALE TWO (2) PUFFS BY MOUTH EVERY 4 HOURS AS NEEDED FOR SHORTNESS OF BREATH/WHEEZING 08/24/23   [name],  Burleigh Carp, MD  allopurinol  (ZYLOPRIM ) 300 MG tablet TAKE 1 TABLET BY MOUTH ONCE DAILY 09/06/23   [name], Kirsten, MD  amLODipine  (NORVASC ) 5 MG tablet TAKE 1 TABLET BY MOUTH ONCE DAILY 11/19/23   [name], Burleigh Carp, MD  ammonium lactate  (LAC-HYDRIN ) 12 % lotion Apply 1 Application topically as needed for dry skin. 09/01/22   [name], Burleigh Carp, MD  aspirin  EC (ASPIRIN  LOW DOSE) 81 MG tablet TAKE 1 TABLET BY MOUTH ONCE DAILY *SWALLOW WHOLE* 08/24/23   [name], Burleigh Carp, MD  Blood Glucose Monitoring Suppl (ACCU-CHEK GUIDE) w/Device KIT 1 each by Does not apply route in the morning, at noon, and at bedtime. 07/24/21   Audie Bleacher, MD  celecoxib  (CELEBREX ) 200 MG capsule Take 1 capsule (200 mg total) by mouth daily. 11/15/22   [name], Burleigh Carp, MD  clopidogrel  (PLAVIX ) 75 MG tablet TAKE 1 TABLET BY MOUTH ONCE DAILY *AUROBINDO BRAND ONLY* 07/26/23   [name], Kirsten, MD  colchicine  0.6 MG tablet TAKE ONE (1) TABLET BY MOUTH TWICE DAILY FOR 7 DAYS, THEN 1 TABLET ONCE DAILY FOR 23 DAYS 09/06/23   [name], Kirsten, MD  Dapagliflozin  Pro-metFORMIN  ER (XIGDUO  XR) 04-999 MG TB24 Take 1 tablet by mouth daily. 11/19/23   [name], Burleigh Carp, MD  Evolocumab  (REPATHA  SURECLICK) 140 MG/ML SOAJ INJECT 140 MG INTO THE SKIN EVERY 14 (FOURTEEN) DAYS. 10/20/23   [name], Burleigh Carp, MD  ezetimibe (ZETIA) 10 MG tablet Take 10 mg by mouth daily.    [provider]  famotidine  (PEPCID ) 20 MG tablet TAKE 1 TABLET BY MOUTH TWICE DAILY 11/19/23   [name], Kirsten, MD  gabapentin  (NEURONTIN ) 300 MG capsule TAKE ONE (1) CAPSULE BY MOUTH TWICE DAILY 09/23/23   [name], Kirsten, MD  Glucagon, rDNA, (GLUCAGON EMERGENCY) 1 MG KIT Inject 1 mg into the muscle as needed (BG less than 70).    [provider]  glucose blood (ACCU-CHEK GUIDE) test strip Use as instructed 07/09/22   [name], Burleigh Carp, MD  insulin  glargine (LANTUS  SOLOSTAR) 100 UNIT/ML Solostar Pen INJECT 30 UNITS SUBCUTANEOUSLY ONCE DAILY 09/08/23   [name], Kirsten, MD  isosorbide  mononitrate (IMDUR ) 60 MG 24 hr tablet  TAKE 1 TABLET BY MOUTH ONCE DAILY 11/19/23   [name], Kirsten, MD  ketoconazole  (NIZORAL ) 2 % cream Apply 1 Application topically daily. 08/12/23   McCaughan, Dia D, DPM  lansoprazole  (PREVACID ) 30 MG capsule TAKE 1 CAPSULE BY MOUTH AT NOON 11/19/23   [name], Burleigh Carp, MD  metoprolol  succinate (TOPROL -XL) 25 MG 24 hr tablet TAKE 1 TABLET BY MOUTH ONCE DAILY 11/19/23   [name], Kirsten, MD  nitroGLYCERIN  (NITROSTAT ) 0.4 MG SL tablet DISSOLVE 1 TABLET UNDER TONGUE EVERY 5 MINUTES AS NEEDED CHEST PAIN. 12/23/21   CoxBurleigh Carp, MD  oxyCODONE -acetaminophen  (PERCOCET/ROXICET) 5-325 MG tablet Take 1 tablet by mouth every 6 (six) hours as needed for severe pain (pain score 7-10). TAKE ONE TABLET BY MOUTH FOUR times daily AS DIRECTEDTAKE ONE TABLET BY MOUTH FOUR times daily AS  DIRECTED 11/19/23   [name], Burleigh Carp, MD  ranolazine  (RANEXA ) 500 MG 12 hr tablet TAKE 1 TABLET BY MOUTH TWICE DAILY 11/19/23   [name], Kirsten, MD  rosuvastatin  (CRESTOR ) 40 MG tablet TAKE 1 TABLET BY MOUTH EVERY EVENING 06/23/23   [name], Kirsten, MD  sacubitril -valsartan  (ENTRESTO ) 24-26 MG TAKE ONE (1) TABLET BY MOUTH TWICE DAILY 03/08/23   [name], Kirsten, MD  Semaglutide , 2 MG/DOSE, 8 MG/3ML SOPN Inject 2 mg as directed once a week. 08/18/23   [name], Burleigh Carp, MD  sertraline  (ZOLOFT ) 25 MG tablet TAKE 1 TABLET BY MOUTH ONCE DAILY 09/29/23   [name], Kirsten, MD  spironolactone  (ALDACTONE ) 25 MG tablet TAKE 1/2 TABLET BY MOUTH ONCE DAILY 09/06/23   [name], Kirsten, MD  SURE COMFORT PEN NEEDLES 31G X 8 MM MISC USE AS DIRECTED WITH LANTUS  INSULIN  ONCE DAILY 12/06/23   [name], Kirsten, MD  tamsulosin  (FLOMAX ) 0.4 MG CAPS capsule TAKE 1 CAPSULE BY MOUTH ONCE DAILY *Eloy Half BRAND ONLY* 08/24/23   [name], Burleigh Carp, MD  traZODone  (DESYREL ) 150 MG tablet TAKE 1 TABLET BY MOUTH EVERY DAY AT BEDTIME 11/19/23   Mercy Stall, MD  TRELEGY ELLIPTA  100-62.5-25 MCG/ACT AEPB INHALE 1 PUFF ONCE DAILY 05/11/23   Mercy Stall, MD    Allergies:    Allergies  Allergen Reactions   Motrin  [Ibuprofen ] Other  (See Comments)    Reaction: ulcers   Buprenorphine  Itching    Itching and dizziness   Other Nausea And Vomiting    Patient has ulcers   Social History:   Social History   Socioeconomic History   Marital status: Widowed    Spouse name: Not on file   Number of children: 5   Years of education: Not on file   Highest education level: Not on file  Occupational History   Occupation: Disabled  Tobacco Use   Smoking status: Every Day    Current packs/day: 1.00    Average packs/day: 1 pack/day for 50.0 years (50.0 ttl pk-yrs)    Types: Cigarettes   Smokeless tobacco: Never  Vaping Use   Vaping status: Former  Substance and Sexual Activity   Alcohol  use: Not on file    Comment: heavy drinker in past. occasionally has a beer. Not on regular basis.   Drug use: Not Currently   Sexual activity: Not Currently  Other Topics Concern   Not on file  Social History Narrative   Not on file   Social Drivers of Health   Financial Resource Strain: Low Risk  (11/19/2023)   Overall Financial Resource Strain (CARDIA)    Difficulty of Paying Living Expenses: Not hard at all  Food Insecurity: No Food Insecurity (11/19/2023)   Hunger Vital Sign    Worried About Running Out of Food in the Last Year: Never true    Ran Out of Food in the Last Year: Never true  Transportation Needs: No Transportation Needs (11/19/2023)   PRAPARE - Administrator, Civil Service (Medical): No    Lack of Transportation (Non-Medical): No  Physical Activity: Inactive (11/19/2023)   Exercise Vital Sign    Days of Exercise per Week: 0 days    Minutes of Exercise per Session: 0 min  Stress: No Stress Concern Present (11/19/2023)   [employer] of Occupational Health - Occupational Stress Questionnaire    Feeling of Stress : Not at all  Social Connections: Socially Isolated (11/19/2023)   Social Connection and Isolation Panel    Frequency of Communication with Friends and Family: More than three  times a week     Frequency of Social Gatherings with Friends and Family: More than three times a week    Attends Religious Services: Never    Database administrator or Organizations: No    Attends Banker Meetings: Never    Marital Status: Widowed  Intimate Partner Violence: Not At Risk (11/19/2023)   Humiliation, Afraid, Rape, and Kick questionnaire    Fear of Current or Ex-Partner: No    Emotionally Abused: No    Physically Abused: No    Sexually Abused: No    Family History:   The patient's family history includes Colon cancer in his mother and another family member.    ROS:  Please see the history of present illness.  All other ROS reviewed and negative.     Physical Exam/Data: Vitals:   12/17/23 1349 12/17/23 1350  BP: 116/65 116/65  Pulse: 88   Resp: 20 20  SpO2: 93%   Weight: 130.2 kg   Height: 5' 8 (1.727 m)    No intake or output data in the 24 hours ending 12/17/23 1440    12/17/2023    1:49 PM 11/19/2023    9:40 AM 08/18/2023   10:19 AM  Last 3 Weights  Weight (lbs) 287 lb 289 lb 287 lb  Weight (kg) 130.182 kg 131.09 kg 130.182 kg     Body mass index is 43.64 kg/m.   General: In no acute distress, gets out of breath with minimal activity, on baseline 2 L of oxygen , HEENT: Bilateral discharge noted at medial corners of eyes Vascular: Distal pulses 2+ bilaterally   Cardiac:  normal S1, S2; RRR; no murmur, distant heart sounds Lungs: Diminished breath sounds  Abd: soft, nontender Ext: no edema Musculoskeletal:  No deformities Skin: warm and dry  Neuro:  no focal abnormalities noted Psych:  Normal affect   EKG:  The ECG that was done 12/15/2023 was personally reviewed and demonstrates sinus rhythm, HR 91, incomplete RBBB  Relevant CV Studies: Echocardiogram, 12/16/2023 [facility]) Left ventricle: Left ventricular chamber size is normal.  There is severely decreased LV wall thickness.  There is normal diastolic function. Wall motion: Globally  normal Ejection fraction: LVEF is 60 to 65% RV: Right ventricle is mildly enlarged.  Right ventricular global systolic function is normal Left atrium: Left atrium is normal in size Right atrium: Right atrium is normal in size Mitral valve: Mitral valve appears structurally normal.  There is an annual mitral valve calcification.  There is no evidence of mitral stenosis.  There is trivial mitral regurg. Aortic valve: Aortic valve suboptimal visualization, appears trileaflet.  There is moderate aortic sclerosis without evidence of stenosis.  There is no evidence of aortic regurgitation Pulmonic valve: Pulmonic valve is not well visualized.  There is no evidence of pulmonic valve stenosis.  There is trace pulmonic valve regurgitation. Tricuspid valve: Tricuspid valve appears structurally normal.  There is no evidence of tricuspid stenosis. Pressures: Right ventricular systolic pressure cannot be estimated on the study Venous: IVC not well-seen.  Pulmonary veins not well-visualized Pericardium: Pericardium appears normal.  There is no pericardial effusion visualized Aorta: Ascending aorta is mildly dilated.  The descending aorta is poorly visualized. Other: There is no pleural effusion present.  No obvious atrial septal defect noted by color Doppler.  Conclusions: There is severely increased LV wall thickness: LVEF 60 to 65%  Nuclear stress test, 12/15/2023 [facility]) Small mild defect involving basal and midportion of the inferior lateral wall  Normal gated images Normal ejection fraction calculated of 66%  Laboratory Data: High Sensitivity Troponin:  No results for input(s): TROPONINIHS in the last 720 hours.    ChemistryNo results for input(s): NA, K, CL, CO2, GLUCOSE, BUN, CREATININE, CALCIUM , MG, GFRNONAA, GFRAA, ANIONGAP in the last 168 hours.  No results for input(s): PROT, ALBUMIN, AST, ALT, ALKPHOS, BILITOT in the last 168 hours. Lipids No  results for input(s): CHOL, TRIG, HDL, LABVLDL, LDLCALC, CHOLHDL in the last 168 hours. HematologyNo results for input(s): WBC, RBC, HGB, HCT, MCV, MCH, MCHC, RDW, PLT in the last 168 hours. Thyroid  No results for input(s): TSH, FREET4 in the last 168 hours. BNPNo results for input(s): BNP, PROBNP in the last 168 hours.  DDimer No results for input(s): DDIMER in the last 168 hours.  Radiology/Studies:  No results found.  Assessment and Plan: Coronary artery disease s/p CABG at age 66 Intermittent chest pain, dyspnea Abnormal stress test Chronic HFpEF Hypertension History of known CAD s/p CABG at age 42 Undergone multiple cardiac catheterizations, last cardiac cath 10 years ago Home meds: Amlodipine  5 mg daily, Imdur  60 mg daily, Toprol  25 mg daily, spironolactone  12.5 mg daily, Entresto  24-26 mg BID, ASA 81 mg daily, Plavix  75 mg daily, Ranexa  500 mg BID Underwent stress at [facility] that showed mild defect involving basal and midportion of inferior lateral wall Negative troponin x 3, BNP normal  Sent to [facility] to undergo cardiac catheterization Will discuss with MD in terms of proceeding with cath  Hyperlipidemia Home meds: Crestor  40 mg daily, Repatha  every 2 weeks Lipid panel at [facility] shows: Total 128, LDL 57, HDL 40 Continue home regimen  Diabetes  Home meds: Xigduo  04-999 mg daily, semaglutide  once weekly  GERD Home meds: lansoprazole  30 mg daily  Gout Home meds: Allopurinol  300 mg daily  Risk Assessment/Risk Scores:   TIMI Risk Score for Unstable Angina or Non-ST Elevation MI:   The patient's TIMI risk score is 4, which indicates a 20% risk of all cause mortality, new or recurrent myocardial infarction or need for urgent revascularization in the next 14 days.  New York  Heart Association (NYHA) Functional Class NYHA Class III   Code Status: Full Code  Severity of Illness: The appropriate patient  status for this patient is OBSERVATION. Observation status is judged to be reasonable and necessary in order to provide the required intensity of service to ensure the patient's safety. The patient's presenting symptoms, physical exam findings, and initial radiographic and laboratory data in the context of their medical condition is felt to place them at decreased risk for further clinical deterioration. Furthermore, it is anticipated that the patient will be medically stable for discharge from the hospital within 2 midnights of admission.   For questions or updates, please contact [facility] Please consult [email or web address] for contact info under     Signed, Jiles Mote, PA-C  12/17/2023 2:40 PM   I have personally seen and examined this patient. I agree with the assessment and plan as outlined above.  Pt transferred to [facility] for cardiac cath. I have reviewed his chart and do not feel that cath is indicated. Baseline dyspnea with advanced COPD, ongoing tobacco abuse and morbid obesity.  Echo normal Troponin negative.  Remote CABG with mildly abnormal nuclear stress test.  Will admit here, cancel cath and d/c home today.   Antoinette Batman, MD, [facility] 12/17/2023 3:26 PM

## 2023-12-17 NOTE — Progress Notes (Signed)
 Pt arrived to cath lab holding bay 07 from [facility] via [facility] transport. PT A&Ox4, VSS, SpO2 92% on 2L [place]. Unknown if 81mg  ASA given ([facility] unsure and pt cannot remember). Ok to give dose at this time per Dr. Abel Hoe. NS infusion started through R 22G PIV placed PTA. Consents signed. Call light within reach.

## 2023-12-22 ENCOUNTER — Other Ambulatory Visit: Payer: Self-pay | Admitting: Family Medicine

## 2023-12-22 DIAGNOSIS — M545 Low back pain, unspecified: Secondary | ICD-10-CM

## 2023-12-22 NOTE — Telephone Encounter (Unsigned)
 Copied from CRM [id number]. Topic: Clinical - Medication Refill >> Dec 22, 2023  8:33 AM Sasha H wrote: Medication: oxyCODONE -acetaminophen  (PERCOCET/ROXICET) 5-325 MG tablet  Has the patient contacted their pharmacy? No (Agent: If no, request that the patient contact the pharmacy for the refill. If patient does not wish to contact the pharmacy document the reason why and proceed with request.) (Agent: If yes, when and what did the pharmacy advise?)  This is the patient's preferred pharmacy:  [id number] [place] - [address] [place] [address] Phone: [phone] Fax: [phone]  Is this the correct pharmacy for this prescription? Yes If no, delete pharmacy and type the correct one.   Has the prescription been filled recently? Yes  Is the patient out of the medication? No  Has the patient been seen for an appointment in the last year OR does the patient have an upcoming appointment? Yes  Can we respond through MyChart? No  Agent: Please be advised that Rx refills may take up to 3 business days. We ask that you follow-up with your pharmacy.

## 2023-12-23 MED ORDER — OXYCODONE-ACETAMINOPHEN 5-325 MG PO TABS: 1.0000 | ORAL_TABLET | Freq: Four times a day (QID) | ORAL | 0 refills | Status: DC | PRN

## 2023-12-24 ENCOUNTER — Encounter: Payer: Self-pay | Admitting: Specialist

## 2024-01-03 ENCOUNTER — Telehealth: Payer: Self-pay

## 2024-01-03 NOTE — Telephone Encounter (Signed)
 Patient was identified as falling into the [place] Measure - Diabetes.   Patient was: Appointment already scheduled for:  August.  Brent Ortiz's A1C shows improvement since 05/2023 and is down to 8.4 taken last month.  Lab Results  Component Value Date   HGBA1C 8.4 (H) 11/16/2023   HGBA1C 9.0 (H) 08/18/2023   HGBA1C 9.2 (H) 05/11/2023

## 2024-01-05 ENCOUNTER — Telehealth: Payer: Self-pay | Admitting: Family Medicine

## 2024-01-05 NOTE — Telephone Encounter (Signed)
 [facility] [place] DMA

## 2024-01-06 DIAGNOSIS — J449 Chronic obstructive pulmonary disease, unspecified: Secondary | ICD-10-CM | POA: Diagnosis not present

## 2024-01-12 DIAGNOSIS — G894 Chronic pain syndrome: Secondary | ICD-10-CM | POA: Diagnosis not present

## 2024-01-17 ENCOUNTER — Other Ambulatory Visit: Payer: Self-pay

## 2024-01-17 DIAGNOSIS — M545 Low back pain, unspecified: Secondary | ICD-10-CM

## 2024-01-17 MED ORDER — OXYCODONE-ACETAMINOPHEN 5-325 MG PO TABS: 1.0000 | ORAL_TABLET | Freq: Four times a day (QID) | ORAL | 0 refills | Status: DC | PRN

## 2024-01-17 NOTE — Telephone Encounter (Signed)
 Called patient mad ehim aware medication will not be able to be sent in earlier than 7/17.   Copied from CRM [id number]. Topic: Clinical - Medication Question >> Jan 17, 2024  1:20 PM Brent Ortiz wrote: Reason for CRM: The patient would like to know when their PCP will be approving their previously requested refill of oxyCODONE -acetaminophen  (PERCOCET/ROXICET) 5-325 MG tablet [[id number]]  Please contact further if/when possible

## 2024-01-19 ENCOUNTER — Other Ambulatory Visit: Payer: Self-pay | Admitting: Family Medicine

## 2024-01-19 DIAGNOSIS — E1142 Type 2 diabetes mellitus with diabetic polyneuropathy: Secondary | ICD-10-CM

## 2024-02-02 ENCOUNTER — Telehealth: Payer: Self-pay | Admitting: Family Medicine

## 2024-02-02 NOTE — Telephone Encounter (Signed)
 [facility] DMA for CMN

## 2024-02-06 DIAGNOSIS — J449 Chronic obstructive pulmonary disease, unspecified: Secondary | ICD-10-CM | POA: Diagnosis not present

## 2024-02-10 DIAGNOSIS — G894 Chronic pain syndrome: Secondary | ICD-10-CM | POA: Diagnosis not present

## 2024-02-17 ENCOUNTER — Other Ambulatory Visit

## 2024-02-18 ENCOUNTER — Other Ambulatory Visit

## 2024-02-18 ENCOUNTER — Other Ambulatory Visit: Payer: Self-pay | Admitting: Family Medicine

## 2024-02-18 DIAGNOSIS — Z794 Long term (current) use of insulin: Secondary | ICD-10-CM | POA: Diagnosis not present

## 2024-02-18 DIAGNOSIS — M545 Low back pain, unspecified: Secondary | ICD-10-CM

## 2024-02-18 DIAGNOSIS — I10 Essential (primary) hypertension: Secondary | ICD-10-CM

## 2024-02-18 DIAGNOSIS — E782 Mixed hyperlipidemia: Secondary | ICD-10-CM

## 2024-02-18 DIAGNOSIS — E1142 Type 2 diabetes mellitus with diabetic polyneuropathy: Secondary | ICD-10-CM | POA: Diagnosis not present

## 2024-02-18 NOTE — Telephone Encounter (Unsigned)
 Copied from CRM [id number]. Topic: Clinical - Medication Refill >> Feb 18, 2024  8:37 AM Avram MATSU wrote: Medication: oxyCODONE -acetaminophen  (PERCOCET/ROXICET) 5-325 MG tablet [[id number]]  Has the patient contacted their pharmacy? Yes (Agent: If no, request that the patient contact the pharmacy for the refill. If patient does not wish to contact the pharmacy document the reason why and proceed with request.) (Agent: If yes, when and what did the pharmacy advise?)  This is the patient's preferred pharmacy:  [id number], [place] - [address] [place] [address] Phone: [phone] Fax: [phone]   Is this the correct pharmacy for this prescription? Yes If no, delete pharmacy and type the correct one.   Has the prescription been filled recently? No  Is the patient out of the medication? No  Has the patient been seen for an appointment in the last year OR does the patient have an upcoming appointment? Yes  Can we respond through MyChart? No  Agent: Please be advised that Rx refills may take up to 3 business days. We ask that you follow-up with your pharmacy.

## 2024-02-19 LAB — LIPID PANEL
Chol/HDL Ratio: 4.2 ratio (ref 0.0–5.0)
Cholesterol, Total: 167 mg/dL (ref 100–199)
HDL: 40 mg/dL (ref >39)
LDL Chol Calc (NIH): 96 mg/dL (ref 0–99)
Triglycerides: 178 mg/dL — ABNORMAL HIGH (ref 0–149)
VLDL Cholesterol Cal: 31 mg/dL (ref 5–40)

## 2024-02-19 LAB — CBC WITH DIFFERENTIAL/PLATELET
Basophils Absolute: 0 x10E3/uL (ref 0.0–0.2)
Basos: 0 %
EOS (ABSOLUTE): 0.1 x10E3/uL (ref 0.0–0.4)
Eos: 1 %
Hematocrit: 47.1 % (ref 37.5–51.0)
Hemoglobin: 15.7 g/dL (ref 13.0–17.7)
Immature Grans (Abs): 0 x10E3/uL (ref 0.0–0.1)
Immature Granulocytes: 0 %
Lymphocytes Absolute: 3.3 x10E3/uL — ABNORMAL HIGH (ref 0.7–3.1)
Lymphs: 26 %
MCH: 31.2 pg (ref 26.6–33.0)
MCHC: 33.3 g/dL (ref 31.5–35.7)
MCV: 94 fL (ref 79–97)
Monocytes Absolute: 0.7 x10E3/uL (ref 0.1–0.9)
Monocytes: 6 %
Neutrophils Absolute: 8.4 x10E3/uL — ABNORMAL HIGH (ref 1.4–7.0)
Neutrophils: 67 %
Platelets: 423 x10E3/uL (ref 150–450)
RBC: 5.04 x10E6/uL (ref 4.14–5.80)
RDW: 13.2 % (ref 11.6–15.4)
WBC: 12.6 x10E3/uL — ABNORMAL HIGH (ref 3.4–10.8)

## 2024-02-19 LAB — HEMOGLOBIN A1C
Est. average glucose Bld gHb Est-mCnc: 186 mg/dL
Hgb A1c MFr Bld: 8.1 % — ABNORMAL HIGH (ref 4.8–5.6)

## 2024-02-19 LAB — MICROALBUMIN / CREATININE URINE RATIO
Creatinine, Urine: 87.2 mg/dL
Microalb/Creat Ratio: 12 mg/g{creat} (ref 0–29)
Microalbumin, Urine: 10.1 ug/mL

## 2024-02-19 LAB — COMPREHENSIVE METABOLIC PANEL WITH GFR
ALT: 11 IU/L (ref 0–44)
AST: 16 IU/L (ref 0–40)
Albumin: 4.3 g/dL (ref 3.9–4.9)
Alkaline Phosphatase: 75 IU/L (ref 44–121)
BUN/Creatinine Ratio: 7 — ABNORMAL LOW (ref 10–24)
BUN: 8 mg/dL (ref 8–27)
Bilirubin Total: 0.7 mg/dL (ref 0.0–1.2)
CO2: 22 mmol/L (ref 20–29)
Calcium: 9.8 mg/dL (ref 8.6–10.2)
Chloride: 100 mmol/L (ref 96–106)
Creatinine, Ser: 1.16 mg/dL (ref 0.76–1.27)
Globulin, Total: 2.4 g/dL (ref 1.5–4.5)
Glucose: 162 mg/dL — ABNORMAL HIGH (ref 70–99)
Potassium: 4.6 mmol/L (ref 3.5–5.2)
Sodium: 141 mmol/L (ref 134–144)
Total Protein: 6.7 g/dL (ref 6.0–8.5)
eGFR: 70 mL/min/1.73 (ref >59)

## 2024-02-21 ENCOUNTER — Encounter: Payer: Self-pay | Admitting: Family Medicine

## 2024-02-21 ENCOUNTER — Ambulatory Visit (INDEPENDENT_AMBULATORY_CARE_PROVIDER_SITE_OTHER): Admitting: Family Medicine

## 2024-02-21 VITALS — BP 124/72 | HR 98 | Temp 98.1°F | Ht 68.0 in | Wt 295.0 lb

## 2024-02-21 DIAGNOSIS — F5101 Primary insomnia: Secondary | ICD-10-CM | POA: Diagnosis not present

## 2024-02-21 DIAGNOSIS — I10 Essential (primary) hypertension: Secondary | ICD-10-CM | POA: Diagnosis not present

## 2024-02-21 DIAGNOSIS — I2510 Atherosclerotic heart disease of native coronary artery without angina pectoris: Secondary | ICD-10-CM

## 2024-02-21 DIAGNOSIS — E782 Mixed hyperlipidemia: Secondary | ICD-10-CM | POA: Diagnosis not present

## 2024-02-21 DIAGNOSIS — N3946 Mixed incontinence: Secondary | ICD-10-CM

## 2024-02-21 DIAGNOSIS — F330 Major depressive disorder, recurrent, mild: Secondary | ICD-10-CM

## 2024-02-21 DIAGNOSIS — M545 Low back pain, unspecified: Secondary | ICD-10-CM | POA: Diagnosis not present

## 2024-02-21 DIAGNOSIS — J410 Simple chronic bronchitis: Secondary | ICD-10-CM

## 2024-02-21 DIAGNOSIS — G894 Chronic pain syndrome: Secondary | ICD-10-CM

## 2024-02-21 DIAGNOSIS — E1142 Type 2 diabetes mellitus with diabetic polyneuropathy: Secondary | ICD-10-CM | POA: Diagnosis not present

## 2024-02-21 DIAGNOSIS — Z87891 Personal history of nicotine dependence: Secondary | ICD-10-CM

## 2024-02-21 DIAGNOSIS — K219 Gastro-esophageal reflux disease without esophagitis: Secondary | ICD-10-CM

## 2024-02-21 DIAGNOSIS — M1A09X0 Idiopathic chronic gout, multiple sites, without tophus (tophi): Secondary | ICD-10-CM | POA: Diagnosis not present

## 2024-02-21 MED ORDER — REPATHA SURECLICK 140 MG/ML ~~LOC~~ SOAJ: 140.0000 mg | SUBCUTANEOUS | 3 refills | Status: AC

## 2024-02-21 MED ORDER — TRELEGY ELLIPTA 100-62.5-25 MCG/ACT IN AEPB: 1.0000 | INHALATION_SPRAY | Freq: Every day | RESPIRATORY_TRACT | 3 refills | Status: AC

## 2024-02-21 MED ORDER — LANCETS MISC. MISC: 3 refills | Status: AC

## 2024-02-21 MED ORDER — LANCET DEVICE MISC: 0 refills | Status: AC

## 2024-02-21 MED ORDER — OXYCODONE-ACETAMINOPHEN 5-325 MG PO TABS: 1.0000 | ORAL_TABLET | Freq: Four times a day (QID) | ORAL | 0 refills | Status: DC | PRN

## 2024-02-21 MED ORDER — BLOOD GLUCOSE MONITORING SUPPL DEVI
1.0000 | Freq: Every day | 0 refills | Status: AC
Start: 2024-02-21 — End: ?

## 2024-02-21 MED ORDER — BLOOD GLUCOSE TEST VI STRP: ORAL_STRIP | 3 refills | Status: AC

## 2024-02-21 NOTE — Progress Notes (Unsigned)
 Subjective:  Patient ID: Brent Ortiz, male    DOB: [birth year]  Age: 63 y.o. MRN: [id number]  Chief Complaint  Patient presents with   Medical Management of Chronic Issues    HPI: DMII: Complicated by hyperlipidemia, hypertension, polyneuropathy.   Sugars: Unknown has not been checking Current medicines: Lantus  30 units daily, ozempic  2mg  weekly, xigduo  XR 04-999 mg daily Checks feet most days.  Over due for eye doctor. Needs to schedule appt. Last A1C was 8.1%   Hyperlipidemia: Rosuvastatin  40 mg daily, Zetia 10 mg daily.   Hypertension: On Spironolactone  25 mg daily, metoprolol  25 mg twice a day, amlodipine  5 mg daily. Patient is working on [employer]. Unable to exercise.     CONGESTIVE HEART FAILURE/CAD: He takes Entresto  24-26 mg twice a day, Isosorbide  60 mg daily, amlodipine  5 mg daily, Spironolactone  25 mg daily, Ranexa  500 mg twice daily, metoprolol  XL 25 mg daily. Cardiologist: Dr. [name].   Gout:  Allopurinol  100 mg daily. Colchicine  as needed.    GERD: pepcid  20 mg twice daily. Lansoprazole  30 mg one daily.    Insomnia: on trazodone  150 mg before bed. Works Firefighter.    Depression: on zoloft  25 mg daily.    Chronic Back Pain: Oxycodone  5-325 mg QID, pain scale 3/10.   Discussed the use of AI scribe software for clinical note transcription with the patient, who gave verbal consent to proceed.  History of Present Illness   Brent Ortiz is a 63 year old male with diabetes and heart disease who presents for follow-up of his chronic conditions.  Respiratory symptoms and tobacco use - Shortness of breath, especially with walking - Uses albuterol  as needed - Continues to smoke and finds it difficult to quit - No cough, wheezing, or hemoptysis - Has not received results from CT scan for lung cancer screening  Musculoskeletal pain - Chronic back pain managed with oxycodone  - Knee pain present - No joint pain beyond back and knees  Mood and sleep  disturbance - Uses trazodone  for sleep - Takes Zoloft  for mood - Describes mood as 'happy go lucky'  Gout and uric acid management - Takes allopurinol  - No recent gout flares  Urinary symptoms - Urinary urgency, especially after sitting in car - No dysuria, hematuria, or incontinence  Ophthalmologic care - Has not had a recent eye exam - Plans to make an appointment  Gastrointestinal symptoms - No nausea, vomiting, or abdominal pain         02/21/2024    1:23 PM 11/19/2023    9:46 AM 08/18/2023   10:20 AM 05/11/2023   10:25 AM 02/01/2023   10:33 AM  Depression screen PHQ 2/9  Decreased Interest 0 0 0 1 3  Down, Depressed, Hopeless 0 0 0 1 1  PHQ - 2 Score 0 0 0 2 4  Altered sleeping 0 0 0 0 0  Tired, decreased energy 0 0 0 0 2  Change in appetite 0 0 0 0 2  Feeling bad or failure about yourself  0 0 0 1 2  Trouble concentrating 0 0 0 0 1  Moving slowly or fidgety/restless 0 0 0 1 1  Suicidal thoughts 0 0 0 0 0  PHQ-9 Score 0 0 0 4 12  Difficult doing work/chores Not difficult at all Not difficult at all Not difficult at all Not difficult at all Somewhat difficult        02/21/2024    1:23 PM  Fall Risk  Falls in the past year? 0  Number falls in past yr: 0  Injury with Fall? 0  Risk for fall due to : No Fall Risks  Follow up Falls evaluation completed    Patient Care Team: Sherre Clapper, MD as PCP - General (Family Medicine) [name], Jennifer SAUNDERS, MD as Consulting Physician (Cardiology) Gaynel Delon CROME, DPM as Consulting Physician (Podiatry)   Review of Systems  Constitutional:  Negative for chills, fatigue and fever.  HENT:  Negative for congestion, ear pain and sore throat.   Respiratory:  Positive for shortness of breath (with walking). Negative for cough.   Cardiovascular:  Negative for chest pain.  Gastrointestinal:  Negative for abdominal pain, constipation, diarrhea, nausea and vomiting.  Endocrine: Positive for polydipsia. Negative for polyphagia and  polyuria.  Genitourinary:  Negative for dysuria and frequency.       Urge incontinence. Wears depends  -says he has run out and is waiting for insurance to pay for them. Using chux also.   Musculoskeletal:  Positive for arthralgias (knees sometimes) and back pain. Negative for myalgias.  Neurological:  Negative for dizziness and headaches.  Psychiatric/Behavioral:  Negative for dysphoric mood. The patient is not nervous/anxious.        No dysphoria    Current Outpatient Medications on File Prior to Visit  Medication Sig Dispense Refill   Accu-Chek Softclix Lancets lancets Use as instructed 100 each 5   albuterol  (VENTOLIN  HFA) 108 (90 Base) MCG/ACT inhaler INHALE TWO (2) PUFFS BY MOUTH EVERY 4 HOURS AS NEEDED FOR SHORTNESS OF BREATH/WHEEZING 8.5 g 10   allopurinol  (ZYLOPRIM ) 300 MG tablet TAKE 1 TABLET BY MOUTH ONCE DAILY 30 tablet 10   amLODipine  (NORVASC ) 5 MG tablet TAKE 1 TABLET BY MOUTH ONCE DAILY 90 tablet 11   ammonium lactate  (LAC-HYDRIN ) 12 % lotion Apply 1 Application topically as needed for dry skin. 400 g 5   aspirin  EC (ASPIRIN  LOW DOSE) 81 MG tablet TAKE 1 TABLET BY MOUTH ONCE DAILY *SWALLOW WHOLE* 30 tablet 10   clopidogrel  (PLAVIX ) 75 MG tablet TAKE 1 TABLET BY MOUTH ONCE DAILY *AUROBINDO BRAND ONLY* 30 tablet 10   ENTRESTO  24-26 MG TAKE ONE (1) TABLET BY MOUTH TWICE DAILY 60 tablet 2   famotidine  (PEPCID ) 20 MG tablet TAKE 1 TABLET BY MOUTH TWICE DAILY 180 tablet 11   gabapentin  (NEURONTIN ) 300 MG capsule TAKE ONE (1) CAPSULE BY MOUTH TWICE DAILY 60 capsule 10   Glucagon, rDNA, (GLUCAGON EMERGENCY) 1 MG KIT Inject 1 mg into the muscle as needed (BG less than 70).     insulin  glargine (LANTUS  SOLOSTAR) 100 UNIT/ML Solostar Pen INJECT 30 UNITS SUBCUTANEOUSLY ONCE DAILY 15 mL 10   isosorbide  mononitrate (IMDUR ) 60 MG 24 hr tablet TAKE 1 TABLET BY MOUTH ONCE DAILY 90 tablet 11   lansoprazole  (PREVACID ) 30 MG capsule TAKE 1 CAPSULE BY MOUTH AT NOON 90 capsule 11   metoprolol   succinate (TOPROL -XL) 25 MG 24 hr tablet TAKE 1 TABLET BY MOUTH ONCE DAILY 90 tablet 11   ranolazine  (RANEXA ) 500 MG 12 hr tablet TAKE 1 TABLET BY MOUTH TWICE DAILY 180 tablet 11   rosuvastatin  (CRESTOR ) 40 MG tablet TAKE 1 TABLET BY MOUTH EVERY EVENING 30 tablet 10   Semaglutide , 2 MG/DOSE, 8 MG/3ML SOPN Inject 2 mg as directed once a week. 9 mL 1   sertraline  (ZOLOFT ) 25 MG tablet TAKE 1 TABLET BY MOUTH ONCE DAILY 30 tablet 5   spironolactone  (ALDACTONE ) 25 MG tablet TAKE 1/2 TABLET  BY MOUTH ONCE DAILY 15 tablet 10   SURE COMFORT PEN NEEDLES 31G X 8 MM MISC USE AS DIRECTED WITH LANTUS  INSULIN  ONCE DAILY 100 each 11   traZODone  (DESYREL ) 150 MG tablet TAKE 1 TABLET BY MOUTH EVERY DAY AT BEDTIME 90 tablet 1   XIGDUO  XR 04-999 MG TB24 TAKE 1 TABLET BY MOUTH DAILY. 90 tablet 11   oxyCODONE -acetaminophen  (PERCOCET/ROXICET) 5-325 MG tablet Take 1 tablet by mouth every 6 (six) hours as needed for severe pain (pain score 7-10). TAKE ONE TABLET BY MOUTH FOUR times daily AS DIRECTEDTAKE ONE TABLET BY MOUTH FOUR times daily AS DIRECTED 120 tablet 0   No current facility-administered medications on file prior to visit.   Past Medical History:  Diagnosis Date   Acute combined systolic and diastolic CHF, NYHA class 2 (HCC) 01/22/2020   Acute gout of left elbow 02/14/2022   Acute idiopathic gout of right wrist 12/29/2021   Alcohol  abuse    Atherosclerosis of coronary artery of native heart with stable angina pectoris, unspecified vessel or lesion type (HCC) 01/23/2016   Overview:  Completely occluded circumflex artery proximal to obtuse marginal 1 basin cardiac catheter from 2017   Atherosclerosis of native coronary artery of native heart without angina pectoris 01/23/2016   Overview:  Completely occluded circumflex artery proximal to obtuse marginal 1 basin cardiac catheter from 2017   Bilateral hip pain 11/17/2021   Bladder incontinence 11/03/2022   BMI 45.0-49.9, adult (HCC) 01/13/2013   Cellulitis  of right upper extremity 01/01/2022   Centrilobular emphysema (HCC) 01/16/2022   Chronic kidney disease    Chronic pain 10/19/2019   Sees pain clinic   Chronic pain syndrome 10/19/2019   Sees pain clinic     COPD (chronic obstructive pulmonary disease) (HCC)    COPD exacerbation (HCC) 10/03/2019   Coronary artery disease involving native heart 02/08/2020   Coronary atherosclerosis of native coronary artery 01/23/2016   Overview:  Completely occluded circumflex artery proximal to obtuse marginal 1 basin cardiac catheter from 2017   Depression, major, recurrent, mild (HCC) 07/11/2020   Depression, major, single episode, moderate (HCC) 07/11/2020   Diabetic polyneuropathy (HCC) 12/06/2019   ED (erectile dysfunction) 12/06/2019   Essential hypertension 01/23/2016   Essential thrombocythemia (HCC) 09/29/2011   GERD (gastroesophageal reflux disease) 11/17/2021   GERD without esophagitis    Gout 02/14/2022   HFrEF (heart failure with reduced ejection fraction) (HCC) 02/08/2020   Idiopathic chronic gout of multiple 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 Left   Leukocytosis 09/04/2011   Lumbar pain 11/17/2021   Lung nodule 10/13/2022   Mild major depression, single episode (HCC) 02/14/2022   Mixed hyperlipidemia 06/18/2017   Mixed incontinence 02/24/2022   Morbid obesity (HCC) 11/21/2020   Morbid obesity with BMI of 40.0-44.9, adult (HCC) 01/13/2013   Obstructive chronic bronchitis with exacerbation (HCC) 10/03/2019   Obstructive sleep apnea 07/18/2011   Olecranon bursitis of left elbow 02/14/2022   Olecranon bursitis of left elbow 02/14/2022   Opiate abuse, continuous (HCC)    Osteoarthritis    Pain in knee joint 02/12/2012   Formatting of this note might be different from the original. Left   Personal history of stroke with residual effects 07/24/2021   Polysubstance abuse (HCC)    Poor dentition     Primary insomnia 08/22/2023   Screening for HIV (human immunodeficiency virus) 10/13/2022   Simple chronic bronchitis (HCC) 05/15/2023  Stage 3b chronic kidney disease (HCC) 12/27/2020   Thrombocytosis 09/29/2011   Tobacco dependence 01/23/2016   Tobacco use disorder 01/23/2016   Type 2 diabetes mellitus with diabetic polyneuropathy (HCC)    Uncomplicated opioid dependence (HCC) 03/30/2022   Past Surgical History:  Procedure Laterality Date   HERNIA REPAIR  2009   I & D KNEE WITH POLY EXCHANGE  09/03/2011   Procedure: IRRIGATION AND DEBRIDEMENT KNEE WITH POLY EXCHANGE;  Surgeon: Donnice JONETTA Car, MD;  Location: [facility];  Service: Orthopedics;  Laterality: Left;   TOOTH EXTRACTION Bilateral 08/06/2017   Procedure: DENTAL RESTORATION/EXTRACTIONS;  Surgeon: Sheryle Hamilton, DDS;  Location: [facility] OR;  Service: Oral Surgery;  Laterality: Bilateral;   TOTAL KNEE ARTHROPLASTY   right  feb 2012   left march 2012 r   TOTAL KNEE REVISION  06/23/2011   Procedure: TOTAL KNEE REVISION;  Surgeon: Donnice JONETTA Car;  Location: [facility];  Service: Orthopedics;  Laterality: Left;  femomal nerve block done in holding area without incident    Family History  Problem Relation Age of Onset   Colon cancer Other    Colon cancer Mother    Social History   Socioeconomic History   Marital status: Widowed    Spouse name: Not on file   Number of children: 5   Years of education: Not on file   Highest education level: Not on file  Occupational History   Occupation: Disabled  Tobacco Use   Smoking status: Every Day    Current packs/day: 1.00    Average packs/day: 1 pack/day for 50.0 years (50.0 ttl pk-yrs)    Types: Cigarettes   Smokeless tobacco: Never  Vaping Use   Vaping status: Former  Substance and Sexual Activity   Alcohol  use: Not on file    Comment: heavy drinker in past. occasionally has a beer. Not on regular basis.   Drug use: Not Currently   Sexual activity: Not Currently  Other Topics Concern    Not on file  Social History Narrative   Not on file   Social Drivers of Health   Financial Resource Strain: Low Risk  (11/19/2023)   Overall Financial Resource Strain (CARDIA)    Difficulty of Paying Living Expenses: Not hard at all  Food Insecurity: No Food Insecurity (11/19/2023)   Hunger Vital Sign    Worried About Running Out of Food in the Last Year: Never true    Ran Out of Food in the Last Year: Never true  Transportation Needs: No Transportation Needs (11/19/2023)   PRAPARE - Administrator, Civil Service (Medical): No    Lack of Transportation (Non-Medical): No  Physical Activity: Inactive (11/19/2023)   Exercise Vital Sign    Days of Exercise per Week: 0 days    Minutes of Exercise per Session: 0 min  Stress: No Stress Concern Present (11/19/2023)   [employer] of Occupational Health - Occupational Stress Questionnaire    Feeling of Stress : Not at all  Social Connections: Socially Isolated (11/19/2023)   Social Connection and Isolation Panel    Frequency of Communication with Friends and Family: More than three times a week    Frequency of Social Gatherings with Friends and Family: More than three times a week    Attends Religious Services: Never    Database administrator or Organizations: No    Attends Banker Meetings: Never    Marital Status: Widowed    Objective:  BP 124/72   Pulse  98   Temp 98.1 F (36.7 C)   Ht 5' 8 (1.727 m)   Wt 295 lb (133.8 kg)   SpO2 99%   BMI 44.85 kg/m      02/21/2024    1:20 PM 12/17/2023    3:15 PM 12/17/2023    3:00 PM  BP/Weight  Systolic BP 124 104 108  Diastolic BP 72 80 85  Wt. (Lbs) 295    BMI 44.85 kg/m2      Physical Exam Vitals reviewed.  Constitutional:      Appearance: Normal appearance. He is obese.  Neck:     Vascular: No carotid bruit.  Cardiovascular:     Rate and Rhythm: Normal rate and regular rhythm.     Pulses: Normal pulses.     Heart sounds: Normal heart sounds.   Pulmonary:     Effort: Pulmonary effort is normal.     Breath sounds: Normal breath sounds. No wheezing, rhonchi or rales.  Abdominal:     General: Bowel sounds are normal.     Palpations: Abdomen is soft.     Tenderness: There is no abdominal tenderness.  Musculoskeletal:        General: Tenderness (lumbar) present.  Neurological:     Mental Status: He is alert.  Psychiatric:        Mood and Affect: Mood normal.        Behavior: Behavior normal.      Diabetic foot exam was performed with the following findings:   Normal sensation of 10g monofilament Intact posterior tibialis and dorsalis pedis pulses Thickened nails, long, calluses.       Lab Results  Component Value Date   WBC 12.6 (H) 02/18/2024   HGB 15.7 02/18/2024   HCT 47.1 02/18/2024   PLT 423 02/18/2024   GLUCOSE 162 (H) 02/18/2024   CHOL 167 02/18/2024   TRIG 178 (H) 02/18/2024   HDL 40 02/18/2024   LDLCALC 96 02/18/2024   ALT 11 02/18/2024   AST 16 02/18/2024   NA 141 02/18/2024   K 4.6 02/18/2024   CL 100 02/18/2024   CREATININE 1.16 02/18/2024   BUN 8 02/18/2024   CO2 22 02/18/2024   TSH 1.140 07/09/2022   INR 1.81 (H) 09/07/2011   HGBA1C 8.1 (H) 02/18/2024   MICROALBUR 80 09/27/2020      Assessment & Plan:  Atherosclerosis of native coronary artery of native heart without angina pectoris Assessment & Plan: History of heart disease with blockages - Previously on Repatha  but not currently receiving it - Takes rosuvastatin  and Zetia for hyperlipidemia - Congestive heart failure managed with Entresto , Sporanox, Ranexa , metoprolol , and isosorbide  - Has not seen cardiologist in nearly a year - Takes aspirin  and Plavix  - No chest pain, dizziness, or headaches  Orders: -     Repatha  SureClick; Inject 140 mg into the skin every 14 (fourteen) days.  Dispense: 6 mL; Refill: 3 -     Ambulatory referral to Cardiology  Type 2 diabetes mellitus with diabetic polyneuropathy, with long-term current  use of insulin  (HCC) Assessment & Plan: A1c is 8.1, previously 7.5 and 6.2 three years ago - Takes Xigduo  XR, Ozempic , and Lantus  - Does not check blood glucose at home due to lack of glucometer - No symptoms of hypoglycemia - No excessive thirst or hunger  Orders: -     Blood Glucose Monitoring Suppl; 1 each by Does not apply route daily before breakfast. May substitute to any manufacturer covered by patient's insurance.  Dispense: 1 each; Refill: 0 -     Blood Glucose Test; Daily before breakfast.May substitute to any manufacturer covered by patient's insurance.  Dispense: 100 strip; Refill: 3 -     Lancet Device; Daily before breakfast. May substitute to any manufacturer covered by patient's insurance.  Dispense: 1 each; Refill: 0 -     Lancets Misc.; Daily before breakfast. May substitute to any manufacturer covered by patient's insurance.  Dispense: 100 each; Refill: 3  Essential hypertension Assessment & Plan: Well controlled.  Patient on spironolactone , metoprolol , and amlodipine . -Continue current medications.   Simple chronic bronchitis (HCC) Assessment & Plan:  Shortness of breath, especially with walking - Uses albuterol  as needed - Continues to smoke and finds it difficult to quit - No cough, wheezing, or hemoptysis - Has not received results from CT scan for lung cancer screening  Orders: -     Trelegy Ellipta ; Inhale 1 puff into the lungs daily.  Dispense: 3 each; Refill: 3  Mixed hyperlipidemia Assessment & Plan: Slightly elevated cholesterol on last labs.  Patient currently on rosuvastatin  and Zetia. -Continue current medications and reassess cholesterol on next labs.   Orders: -     Repatha  SureClick; Inject 140 mg into the skin every 14 (fourteen) days.  Dispense: 6 mL; Refill: 3  Chronic pain syndrome Assessment & Plan: Chronic back pain managed with oxycodone  - Knee pain present - No joint pain beyond back and knees   Depression, major, recurrent,  mild (HCC) Assessment & Plan: Well controlled.  On sertraline  25 mg daily. Continue trazodone  150 mg before bed.     History of tobacco use -     CT CHEST LUNG CANCER SCREENING LOW DOSE WO CONTRAST  Lumbar pain Assessment & Plan: Continue celebrex  200 mg daily.  Continue oxycodone  5/325 mg four times a day.    Primary insomnia Assessment & Plan: Uses trazodone  for sleep - Takes Zoloft  for mood - Describes mood as 'happy go lucky'   Idiopathic chronic gout of multiple sites without tophus Assessment & Plan: - Takes allopurinol  - No recent gout flares   Mixed stress and urge urinary incontinence Assessment & Plan: - Urinary urgency, especially after sitting in car - No dysuria, hematuria, or incontinence   Gastroesophageal reflux disease without esophagitis Assessment & Plan: - No nausea, vomiting, or abdominal pain               Meds ordered this encounter  Medications   Blood Glucose Monitoring Suppl DEVI    Sig: 1 each by Does not apply route daily before breakfast. May substitute to any manufacturer covered by patient's insurance.    Dispense:  1 each    Refill:  0   Glucose Blood (BLOOD GLUCOSE TEST STRIPS) STRP    Sig: Daily before breakfast.May substitute to any manufacturer covered by patient's insurance.    Dispense:  100 strip    Refill:  3   Lancet Device MISC    Sig: Daily before breakfast. May substitute to any manufacturer covered by patient's insurance.    Dispense:  1 each    Refill:  0   Lancets Misc. MISC    Sig: Daily before breakfast. May substitute to any manufacturer covered by patient's insurance.    Dispense:  100 each    Refill:  3   Evolocumab  (REPATHA  SURECLICK) 140 MG/ML SOAJ    Sig: Inject 140 mg into the skin every 14 (fourteen) days.    Dispense:  6 mL  Refill:  3   Fluticasone-Umeclidin-Vilant (TRELEGY ELLIPTA ) 100-62.5-25 MCG/ACT AEPB    Sig: Inhale 1 puff into the lungs daily.    Dispense:  3 each    Refill:  3     Orders Placed This Encounter  Procedures   CT CHEST LUNG CANCER SCREENING LOW DOSE WO CONTRAST   Ambulatory referral to Cardiology     Follow-up: Return in about 3 months (around 05/23/2024) for chronic follow up.   An After Visit Summary was printed and given to the patient.  Abigail Free, MD [facility] [phone]

## 2024-02-21 NOTE — Patient Instructions (Addendum)
 VISIT SUMMARY:  Today, we reviewed your diabetes, heart disease, and other chronic conditions. We adjusted your diabetes medication, addressed your heart disease management, and discussed your respiratory symptoms and tobacco use. We also reviewed your back pain, urinary symptoms, and general health maintenance needs.  YOUR PLAN:  TYPE 2 DIABETES MELLITUS: Your blood sugar levels are higher than desired. -Increase Lantus  to 36 units daily and adjust by 2 units every 3 days until blood sugars are consistently below 140 mg/dL. -Continue taking Xigduo  XR and Ozempic . -I have sent a glucometer and a log to monitor your blood sugar levels at home.  CONGESTIVE HEART FAILURE: You have not seen your cardiologist recently. -I am going to refer you back to your cardiologist, Dr. [name] for regular follow-up.  ATHEROSCLEROTIC HEART DISEASE: You need better management of your LDL cholesterol levels. -Start Repatha  injection every two weeks. Send prescription to [facility].  -Continue taking rosuvastatin  and Zetia.  HYPERLIPIDEMIA: Your LDL and triglycerides are slightly elevated. -Start Repatha  injection every two weeks. -Continue taking rosuvastatin  and Zetia.  CHRONIC OBSTRUCTIVE PULMONARY DISEASE (COPD): You experience shortness of breath and use an albuterol  inhaler as needed. -restart trelegy one inhalation daily.  -Continue using albuterol  as needed.  TOBACCO USE DISORDER: You continue to smoke and find it difficult to quit. -We encourage you to quit smoking and will discuss potential strategies to help you.  CHRONIC BACK PAIN: You have chronic back pain managed with oxycodone . -Continue taking oxycodone  as prescribed.  URINARY INCONTINENCE: You have urinary urgency, especially when transitioning from sitting to standing.  GOUT: You have no recent gout flares. -Continue taking allopurinol .  GASTROESOPHAGEAL REFLUX DISEASE (GERD): You have GERD managed with medication. -Continue  taking famotidine  and lansoprazole .  DEPRESSION: Your mood is stable on your current medication regimen. -Continue taking trazodone  and Zoloft .  GENERAL HEALTH MAINTENANCE: You are due for some vaccines and screenings. -Get the shingles vaccine series at the pharmacy. -Get the tetanus vaccine at the pharmacy. -We will order a CT scan for lung cancer screening. -Schedule an eye exam.

## 2024-02-22 NOTE — Assessment & Plan Note (Signed)
-   No nausea, vomiting, or abdominal pain

## 2024-02-22 NOTE — Assessment & Plan Note (Signed)
 History of heart disease with blockages - Previously on Repatha  but not currently receiving it - Takes rosuvastatin  and Zetia for hyperlipidemia - Congestive heart failure managed with Entresto , Sporanox, Ranexa , metoprolol , and isosorbide  - Has not seen cardiologist in nearly a year - Takes aspirin  and Plavix  - No chest pain, dizziness, or headaches

## 2024-02-22 NOTE — Assessment & Plan Note (Signed)
 Chronic back pain managed with oxycodone  - Knee pain present - No joint pain beyond back and knees

## 2024-02-22 NOTE — Assessment & Plan Note (Signed)
-   Urinary urgency, especially after sitting in car - No dysuria, hematuria, or incontinence

## 2024-02-22 NOTE — Assessment & Plan Note (Signed)
 Takes allopurinol .  No recent gout flares.

## 2024-02-22 NOTE — Assessment & Plan Note (Signed)
 Shortness of breath, especially with walking - Uses albuterol  as needed - Continues to smoke and finds it difficult to quit - No cough, wheezing, or hemoptysis - Has not received results from CT scan for lung cancer screening

## 2024-02-22 NOTE — Assessment & Plan Note (Signed)
 Uses trazodone  for sleep - Takes Zoloft  for mood - Describes mood as 'happy go lucky'

## 2024-02-22 NOTE — Assessment & Plan Note (Signed)
 A1c is 8.1, previously 7.5 and 6.2 three years ago - Takes Xigduo  XR, Ozempic , and Lantus  - Does not check blood glucose at home due to lack of glucometer - No symptoms of hypoglycemia - No excessive thirst or hunger

## 2024-02-24 NOTE — Assessment & Plan Note (Signed)
Continue celebrex 200 mg daily.  Continue oxycodone 5/325 mg four times a day.

## 2024-02-24 NOTE — Assessment & Plan Note (Signed)
 Slightly elevated cholesterol on last labs. Patient currently on rosuvastatin and Zetia. -Continue current medications and reassess cholesterol on next labs.

## 2024-02-24 NOTE — Assessment & Plan Note (Signed)
 Well controlled.  Patient on spironolactone , metoprolol , and amlodipine . -Continue current medications.

## 2024-02-24 NOTE — Assessment & Plan Note (Signed)
 Well controlled.  On sertraline  25 mg daily. Continue trazodone  150 mg before bed.

## 2024-03-03 ENCOUNTER — Ambulatory Visit: Admitting: Podiatry

## 2024-03-03 DIAGNOSIS — B351 Tinea unguium: Secondary | ICD-10-CM

## 2024-03-03 DIAGNOSIS — M79675 Pain in left toe(s): Secondary | ICD-10-CM | POA: Diagnosis not present

## 2024-03-03 DIAGNOSIS — M79674 Pain in right toe(s): Secondary | ICD-10-CM

## 2024-03-03 NOTE — Progress Notes (Signed)
 Subjective:  Patient ID: Brent Ortiz, male    DOB: 1960-11-05,  MRN: [id number]  Brent Ortiz presents to clinic today for:  Chief Complaint  Patient presents with   [facility] with out callous, dry skin A1c was 8.1 on 02/18/2024 Plavix .    Patient notes nails are thick, discolored, elongated and painful in shoegear when trying to ambulate.    PCP is [name], Abigail, MD.  Past Medical History:  Diagnosis Date   Acute combined systolic and diastolic CHF, NYHA class 2 (HCC) 01/22/2020   Acute gout of left elbow 02/14/2022   Acute idiopathic gout of right wrist 12/29/2021   Alcohol  abuse    Atherosclerosis of coronary artery of native heart with stable angina pectoris, unspecified vessel or lesion type (HCC) 01/23/2016   Overview:  Completely occluded circumflex artery proximal to obtuse marginal 1 basin cardiac catheter from 2017   Atherosclerosis of native coronary artery of native heart without angina pectoris 01/23/2016   Overview:  Completely occluded circumflex artery proximal to obtuse marginal 1 basin cardiac catheter from 2017   Bilateral hip pain 11/17/2021   Bladder incontinence 11/03/2022   BMI 45.0-49.9, adult (HCC) 01/13/2013   Cellulitis of right upper extremity 01/01/2022   Centrilobular emphysema (HCC) 01/16/2022   Chronic kidney disease    Chronic pain 10/19/2019   Sees pain clinic   Chronic pain syndrome 10/19/2019   Sees pain clinic     COPD (chronic obstructive pulmonary disease) (HCC)    COPD exacerbation (HCC) 10/03/2019   Coronary artery disease involving native heart 02/08/2020   Coronary atherosclerosis of native coronary artery 01/23/2016   Overview:  Completely occluded circumflex artery proximal to obtuse marginal 1 basin cardiac catheter from 2017   Depression, major, recurrent, mild (HCC) 07/11/2020   Depression, major, single episode, moderate (HCC) 07/11/2020   Diabetic polyneuropathy (HCC) 12/06/2019   ED (erectile dysfunction)  12/06/2019   Essential hypertension 01/23/2016   Essential thrombocythemia (HCC) 09/29/2011   GERD (gastroesophageal reflux disease) 11/17/2021   GERD without esophagitis    Gout 02/14/2022   HFrEF (heart failure with reduced ejection fraction) (HCC) 02/08/2020   Idiopathic chronic gout of multiple 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 Left   Leukocytosis 09/04/2011   Lumbar pain 11/17/2021   Lung nodule 10/13/2022   Mild major depression, single episode (HCC) 02/14/2022   Mixed hyperlipidemia 06/18/2017   Mixed incontinence 02/24/2022   Morbid obesity (HCC) 11/21/2020   Morbid obesity with BMI of 40.0-44.9, adult (HCC) 01/13/2013   Obstructive chronic bronchitis with exacerbation (HCC) 10/03/2019   Obstructive sleep apnea 07/18/2011   Olecranon bursitis of left elbow 02/14/2022   Olecranon bursitis of left elbow 02/14/2022   Opiate abuse, continuous (HCC)    Osteoarthritis    Pain in knee joint 02/12/2012   Formatting of this note might be different from the original. Left   Personal history of stroke with residual effects 07/24/2021   Polysubstance abuse (HCC)    Poor dentition    Primary insomnia 08/22/2023   Screening for HIV (human immunodeficiency virus) 10/13/2022   Simple chronic bronchitis (HCC) 05/15/2023   Stage 3b chronic kidney disease (HCC) 12/27/2020   Thrombocytosis 09/29/2011   Tobacco dependence 01/23/2016   Tobacco use disorder 01/23/2016   Type 2 diabetes mellitus with diabetic polyneuropathy (HCC)    Uncomplicated opioid dependence (HCC) 03/30/2022   Past  Surgical History:  Procedure Laterality Date   HERNIA REPAIR  2009   I & D KNEE WITH POLY EXCHANGE  09/03/2011   Procedure: IRRIGATION AND DEBRIDEMENT KNEE WITH POLY EXCHANGE;  Surgeon: Donnice JONETTA Car, MD;  Location: [facility] ORS;  Service: Orthopedics;  Laterality: Left;   TOOTH EXTRACTION Bilateral 08/06/2017   Procedure:  DENTAL RESTORATION/EXTRACTIONS;  Surgeon: Sheryle Hamilton, DDS;  Location: [facility] OR;  Service: Oral Surgery;  Laterality: Bilateral;   TOTAL KNEE ARTHROPLASTY   right  feb 2012   left march 2012 r   TOTAL KNEE REVISION  06/23/2011   Procedure: TOTAL KNEE REVISION;  Surgeon: Donnice JONETTA Car;  Location: [facility];  Service: Orthopedics;  Laterality: Left;  femomal nerve block done in holding area without incident   Allergies  Allergen Reactions   Motrin  [Ibuprofen ] Other (See Comments)    Reaction: ulcers   Buprenorphine  Itching    Itching and dizziness   Other Nausea And Vomiting    Patient has ulcers    Review of Systems: Negative except as noted in the HPI.  Objective:  TEONDRE JAROSZ is a pleasant 63 y.o. male in NAD. AAO x 3.  Vascular Examination: Capillary refill time is 3-5 seconds to toes bilateral. Palpable pedal pulses b/l LE. Digital hair sparse b/l.  Skin temperature gradient WNL b/l.  +1 pitting edema in legs and ankles bilateral  Dermatological Examination: Pedal skin with increased turgor, texture and tone b/l. No open wounds. No interdigital macerations b/l. Toenails x10 are 4-80mm thick, discolored, dystrophic with subungual debris. There is pain with compression of the nail plates.  They are elongated x10     Latest Ref Rng & Units 02/18/2024   10:42 AM 11/16/2023    9:58 AM 08/18/2023   11:09 AM 05/11/2023   11:18 AM  Hemoglobin A1C  Hemoglobin-A1c 4.8 - 5.6 % 8.1  8.4  9.0  9.2    Assessment/Plan: 1. Pain due to onychomycosis of toenails of both feet     The mycotic toenails were sharply debrided x10 with sterile nail nippers and a power debriding burr to decrease bulk/thickness and length.   Return in about 3 months (around 06/03/2024) for [facility].   Awanda CHARM Imperial, DPM, FACFAS [facility]     [address] [place], [place] [address]                Office [phone]  Fax [phone]

## 2024-03-14 DIAGNOSIS — G894 Chronic pain syndrome: Secondary | ICD-10-CM | POA: Diagnosis not present

## 2024-03-20 ENCOUNTER — Other Ambulatory Visit: Payer: Self-pay | Admitting: Family Medicine

## 2024-03-20 DIAGNOSIS — M545 Low back pain, unspecified: Secondary | ICD-10-CM

## 2024-03-20 MED ORDER — OXYCODONE-ACETAMINOPHEN 5-325 MG PO TABS: 1.0000 | ORAL_TABLET | Freq: Four times a day (QID) | ORAL | 0 refills | Status: DC | PRN

## 2024-03-20 NOTE — Telephone Encounter (Signed)
 Copied from CRM [id number]. Topic: Clinical - Medication Refill >> Mar 20, 2024  3:01 PM Mia F wrote: Medication: oxyCODONE -acetaminophen  (PERCOCET/ROXICET) 5-325 MG tablet   Has the patient contacted their pharmacy? No (Agent: If no, request that the patient contact the pharmacy for the refill. If patient does not wish to contact the pharmacy document the reason why and proceed with request.) (Agent: If yes, when and what did the pharmacy advise?) No, controlled substance  This is the patient's preferred pharmacy:  [id number], [place] - [address] [place] [address] Phone: [phone] Fax: [phone]   Is this the correct pharmacy for this prescription? Yes If no, delete pharmacy and type the correct one.   Has the prescription been filled recently? Yes  Is the patient out of the medication? No  Has the patient been seen for an appointment in the last year OR does the patient have an upcoming appointment? Yes  Can we respond through MyChart? Yes  Agent: Please be advised that Rx refills may take up to 3 business days. We ask that you follow-up with your pharmacy.

## 2024-03-22 ENCOUNTER — Telehealth: Payer: Self-pay

## 2024-03-22 NOTE — Telephone Encounter (Signed)
 PA submitted and approved via covermymeds for repatha.

## 2024-03-29 ENCOUNTER — Ambulatory Visit: Payer: Self-pay

## 2024-03-29 NOTE — Telephone Encounter (Signed)
 FYI Only or Action Required?: FYI only for provider.  Patient was last seen in primary care on 02/21/2024 by Sherre Clapper, MD.  Called Nurse Triage reporting Back Pain.  Symptoms began several years ago.  Interventions attempted: oxycodone .  Symptoms are: gradually worsening.  Triage Disposition: See PCP When Office is Open (Within 3 Days)  Patient/caregiver understands and will follow disposition?: Yes     Copied from CRM [id number]. Topic: Clinical - Red Word Triage >> Mar 29, 2024 11:21 AM Brent Ortiz wrote: Red Word that prompted transfer to Nurse Triage: Back pain getting worse, seeking relief. Arthritis Reason for Disposition  [1] MODERATE back pain (e.g., interferes with normal activities) AND [2] present > 3 days  Answer Assessment - Initial Assessment Questions 1. ONSET: When did the pain begin? (e.g., minutes, hours, days)     5-6 years 2. LOCATION: Where does it hurt? (upper, mid or lower back)     Lower back 3. SEVERITY: How bad is the pain?  (e.g., Scale 1-10; mild, moderate, or severe)     Moderate to severe 4. PATTERN: Is the pain constant? (e.g., yes, no; constant, intermittent)      Comes and goes 5. RADIATION: Does the pain shoot into your legs or somewhere else?     To the knees, hx of knee replacements 6. CAUSE:  What do you think is causing the back pain?      arthritis 7. BACK OVERUSE:  Any recent lifting of heavy objects, strenuous work or exercise?     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 MEDICINES: What have you taken so far for the pain? (e.g., nothing, acetaminophen , NSAIDS)     Oxycodone  5/325 mg, icy hot 9. NEUROLOGIC SYMPTOMS: Do you have any weakness, numbness, or problems with bowel/bladder control?     no 10. OTHER SYMPTOMS: Do you have any other symptoms? (e.g., fever, abdomen pain, burning with urination, blood in urine)       no 11. PREGNANCY: Is there any chance you are pregnant? When was your last  menstrual period?       na  Protocols used: Back Pain-A-AH

## 2024-03-31 ENCOUNTER — Ambulatory Visit: Admitting: Family Medicine

## 2024-04-02 NOTE — Progress Notes (Deleted)
 Acute Office Visit  Subjective:    Patient ID: Brent Ortiz, male    DOB: 1961/04/16, 63 y.o.   MRN: [id number]  No chief complaint on file.   HPI: Patient is in today for ***  Discussed the use of AI scribe software for clinical note transcription with the patient, who gave verbal consent to proceed.  History of Present Illness     Past Medical History:  Diagnosis Date   Acute combined systolic and diastolic CHF, NYHA class 2 (HCC) 01/22/2020   Acute gout of left elbow 02/14/2022   Acute idiopathic gout of right wrist 12/29/2021   Alcohol  abuse    Atherosclerosis of coronary artery of native heart with stable angina pectoris, unspecified vessel or lesion type 01/23/2016   Overview:  Completely occluded circumflex artery proximal to obtuse marginal 1 basin cardiac catheter from 2017   Atherosclerosis of native coronary artery of native heart without angina pectoris 01/23/2016   Overview:  Completely occluded circumflex artery proximal to obtuse marginal 1 basin cardiac catheter from 2017   Bilateral hip pain 11/17/2021   Bladder incontinence 11/03/2022   BMI 45.0-49.9, adult (HCC) 01/13/2013   Cellulitis of right upper extremity 01/01/2022   Centrilobular emphysema (HCC) 01/16/2022   Chronic kidney disease    Chronic pain 10/19/2019   Sees pain clinic   Chronic pain syndrome 10/19/2019   Sees pain clinic     COPD (chronic obstructive pulmonary disease) (HCC)    COPD exacerbation (HCC) 10/03/2019   Coronary artery disease involving native heart 02/08/2020   Coronary atherosclerosis of native coronary artery 01/23/2016   Overview:  Completely occluded circumflex artery proximal to obtuse marginal 1 basin cardiac catheter from 2017   Depression, major, recurrent, mild 07/11/2020   Depression, major, single episode, moderate (HCC) 07/11/2020   Diabetic polyneuropathy (HCC) 12/06/2019   ED (erectile dysfunction) 12/06/2019   Essential hypertension 01/23/2016    Essential thrombocythemia (HCC) 09/29/2011   GERD (gastroesophageal reflux disease) 11/17/2021   GERD without esophagitis    Gout 02/14/2022   HFrEF (heart failure with reduced ejection fraction) (HCC) 02/08/2020   Idiopathic chronic gout of multiple 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 Left   Leukocytosis 09/04/2011   Lumbar pain 11/17/2021   Lung nodule 10/13/2022   Mild major depression, single episode 02/14/2022   Mixed hyperlipidemia 06/18/2017   Mixed incontinence 02/24/2022   Morbid obesity (HCC) 11/21/2020   Morbid obesity with BMI of 40.0-44.9, adult (HCC) 01/13/2013   Obstructive chronic bronchitis with exacerbation (HCC) 10/03/2019   Obstructive sleep apnea 07/18/2011   Olecranon bursitis of left elbow 02/14/2022   Olecranon bursitis of left elbow 02/14/2022   Opiate abuse, continuous (HCC)    Osteoarthritis    Pain in knee joint 02/12/2012   Formatting of this note might be different from the original. Left   Personal history of stroke with residual effects 07/24/2021   Polysubstance abuse (HCC)    Poor dentition    Primary insomnia 08/22/2023   Screening for HIV (human immunodeficiency virus) 10/13/2022   Simple chronic bronchitis (HCC) 05/15/2023   Stage 3b chronic kidney disease (HCC) 12/27/2020   Thrombocytosis 09/29/2011   Tobacco dependence 01/23/2016   Tobacco use disorder 01/23/2016   Type 2 diabetes mellitus with diabetic polyneuropathy (HCC)    Uncomplicated opioid dependence (HCC) 03/30/2022    Past Surgical History:  Procedure Laterality Date   HERNIA REPAIR  2009   I & D KNEE WITH POLY EXCHANGE  09/03/2011   Procedure: IRRIGATION AND DEBRIDEMENT KNEE WITH POLY EXCHANGE;  Surgeon: Donnice JONETTA Car, MD;  Location: [facility];  Service: Orthopedics;  Laterality: Left;   TOOTH EXTRACTION Bilateral 08/06/2017   Procedure: DENTAL RESTORATION/EXTRACTIONS;  Surgeon: Sheryle Hamilton, DDS;  Location: [facility] OR;  Service: Oral Surgery;  Laterality: Bilateral;   TOTAL KNEE ARTHROPLASTY   right  feb 2012   left march 2012 r   TOTAL KNEE REVISION  06/23/2011   Procedure: TOTAL KNEE REVISION;  Surgeon: Donnice JONETTA Car;  Location: [facility];  Service: Orthopedics;  Laterality: Left;  femomal nerve block done in holding area without incident    Family History  Problem Relation Age of Onset   Colon cancer Other    Colon cancer Mother     Social History   Socioeconomic History   Marital status: Widowed    Spouse name: Not on file   Number of children: 5   Years of education: Not on file   Highest education level: Not on file  Occupational History   Occupation: Disabled  Tobacco Use   Smoking status: Every Day    Current packs/day: 1.00    Average packs/day: 1 pack/day for 50.0 years (50.0 ttl pk-yrs)    Types: Cigarettes   Smokeless tobacco: Never  Vaping Use   Vaping status: Former  Substance and Sexual Activity   Alcohol  use: Not on file    Comment: heavy drinker in past. occasionally has a beer. Not on regular basis.   Drug use: Not Currently   Sexual activity: Not Currently  Other Topics Concern   Not on file  Social History Narrative   Not on file   Social Drivers of Health   Financial Resource Strain: Low Risk  (11/19/2023)   Overall Financial Resource Strain (CARDIA)    Difficulty of Paying Living Expenses: Not hard at all  Food Insecurity: No Food Insecurity (11/19/2023)   Hunger Vital Sign    Worried About Running Out of Food in the Last Year: Never true    Ran Out of Food in the Last Year: Never true  Transportation Needs: No Transportation Needs (11/19/2023)   PRAPARE - Administrator, Civil Service (Medical): No    Lack of Transportation (Non-Medical): No  Physical Activity: Inactive (11/19/2023)   Exercise Vital Sign    Days of Exercise per Week: 0 days    Minutes of Exercise per Session: 0 min  Stress: No Stress Concern Present  (11/19/2023)   [employer] of Occupational Health - Occupational Stress Questionnaire    Feeling of Stress : Not at all  Social Connections: Socially Isolated (11/19/2023)   Social Connection and Isolation Panel    Frequency of Communication with Friends and Family: More than three times a week    Frequency of Social Gatherings with Friends and Family: More than three times a week    Attends Religious Services: Never    Database administrator or Organizations: No    Attends Banker Meetings: Never    Marital Status: Widowed  Intimate Partner Violence: Not At Risk (11/19/2023)   Humiliation, Afraid, Rape, and Kick questionnaire    Fear of Current or Ex-Partner: No    Emotionally Abused: No    Physically Abused: No    Sexually Abused: No    Outpatient Medications Prior to Visit  Medication Sig Dispense Refill   Accu-Chek Softclix Lancets lancets  Use as instructed 100 each 5   albuterol  (VENTOLIN  HFA) 108 (90 Base) MCG/ACT inhaler INHALE TWO (2) PUFFS BY MOUTH EVERY 4 HOURS AS NEEDED FOR SHORTNESS OF BREATH/WHEEZING 8.5 g 10   allopurinol  (ZYLOPRIM ) 300 MG tablet TAKE 1 TABLET BY MOUTH ONCE DAILY 30 tablet 10   amLODipine  (NORVASC ) 5 MG tablet TAKE 1 TABLET BY MOUTH ONCE DAILY 90 tablet 11   ammonium lactate  (LAC-HYDRIN ) 12 % lotion Apply 1 Application topically as needed for dry skin. 400 g 5   aspirin  EC (ASPIRIN  LOW DOSE) 81 MG tablet TAKE 1 TABLET BY MOUTH ONCE DAILY *SWALLOW WHOLE* 30 tablet 10   Blood Glucose Monitoring Suppl DEVI 1 each by Does not apply route daily before breakfast. May substitute to any manufacturer covered by patient's insurance. 1 each 0   clopidogrel  (PLAVIX ) 75 MG tablet TAKE 1 TABLET BY MOUTH ONCE DAILY *AUROBINDO BRAND ONLY* 30 tablet 10   ENTRESTO  24-26 MG TAKE ONE (1) TABLET BY MOUTH TWICE DAILY 60 tablet 2   Evolocumab  (REPATHA  SURECLICK) 140 MG/ML SOAJ Inject 140 mg into the skin every 14 (fourteen) days. 6 mL 3   famotidine  (PEPCID ) 20  MG tablet TAKE 1 TABLET BY MOUTH TWICE DAILY 180 tablet 11   Fluticasone-Umeclidin-Vilant (TRELEGY ELLIPTA ) 100-62.5-25 MCG/ACT AEPB Inhale 1 puff into the lungs daily. 3 each 3   gabapentin  (NEURONTIN ) 300 MG capsule TAKE ONE (1) CAPSULE BY MOUTH TWICE DAILY 60 capsule 10   Glucagon, rDNA, (GLUCAGON EMERGENCY) 1 MG KIT Inject 1 mg into the muscle as needed (BG less than 70).     Glucose Blood (BLOOD GLUCOSE TEST STRIPS) STRP Daily before breakfast.May substitute to any manufacturer covered by patient's insurance. 100 strip 3   insulin  glargine (LANTUS  SOLOSTAR) 100 UNIT/ML Solostar Pen INJECT 30 UNITS SUBCUTANEOUSLY ONCE DAILY 15 mL 10   isosorbide  mononitrate (IMDUR ) 60 MG 24 hr tablet TAKE 1 TABLET BY MOUTH ONCE DAILY 90 tablet 11   Lancet Device MISC Daily before breakfast. May substitute to any manufacturer covered by patient's insurance. 1 each 0   Lancets Misc. MISC Daily before breakfast. May substitute to any manufacturer covered by patient's insurance. 100 each 3   lansoprazole  (PREVACID ) 30 MG capsule TAKE 1 CAPSULE BY MOUTH AT NOON 90 capsule 11   metoprolol  succinate (TOPROL -XL) 25 MG 24 hr tablet TAKE 1 TABLET BY MOUTH ONCE DAILY 90 tablet 11   oxyCODONE -acetaminophen  (PERCOCET/ROXICET) 5-325 MG tablet Take 1 tablet by mouth every 6 (six) hours as needed for severe pain (pain score 7-10). TAKE ONE TABLET BY MOUTH FOUR times daily AS DIRECTEDTAKE ONE TABLET BY MOUTH FOUR times daily AS DIRECTED 120 tablet 0   ranolazine  (RANEXA ) 500 MG 12 hr tablet TAKE 1 TABLET BY MOUTH TWICE DAILY 180 tablet 11   rosuvastatin  (CRESTOR ) 40 MG tablet TAKE 1 TABLET BY MOUTH EVERY EVENING 30 tablet 10   Semaglutide , 2 MG/DOSE, 8 MG/3ML SOPN Inject 2 mg as directed once a week. 9 mL 1   sertraline  (ZOLOFT ) 25 MG tablet TAKE 1 TABLET BY MOUTH ONCE DAILY 90 tablet 1   spironolactone  (ALDACTONE ) 25 MG tablet TAKE 1/2 TABLET BY MOUTH ONCE DAILY 15 tablet 10   SURE COMFORT PEN NEEDLES 31G X 8 MM MISC USE AS  DIRECTED WITH LANTUS  INSULIN  ONCE DAILY 100 each 11   traZODone  (DESYREL ) 150 MG tablet TAKE 1 TABLET BY MOUTH EVERY DAY AT BEDTIME 90 tablet 1   XIGDUO  XR 04-999 MG TB24 TAKE 1 TABLET BY  MOUTH DAILY. 90 tablet 11   No facility-administered medications prior to visit.    Allergies  Allergen Reactions   Motrin  [Ibuprofen ] Other (See Comments)    Reaction: ulcers   Buprenorphine  Itching    Itching and dizziness   Other Nausea And Vomiting    Patient has ulcers    Review of Systems     Objective:        02/21/2024    1:20 PM 12/17/2023    3:15 PM 12/17/2023    3:00 PM  Vitals with BMI  Height 5' 8    Weight 295 lbs    BMI 44.86    Systolic 124 104 891  Diastolic 72 80 85  Pulse 98 88 85    No data found.   Physical Exam  Health Maintenance Due  Topic Date Due   DTaP/Tdap/Td (1 - Tdap) Never done   Zoster Vaccines- Shingrix (2 of 2) 07/02/2019   OPHTHALMOLOGY EXAM  11/12/2021   Lung Cancer Screening  02/19/2023   COVID-19 Vaccine (6 - Pfizer risk 2024-25 season) 03/06/2024   Medicare Annual Wellness (AWV)  05/10/2024   Colonoscopy  07/06/2024    There are no preventive care reminders to display for this patient.   Lab Results  Component Value Date   TSH 1.140 07/09/2022   Lab Results  Component Value Date   WBC 12.6 (H) 02/18/2024   HGB 15.7 02/18/2024   HCT 47.1 02/18/2024   MCV 94 02/18/2024   PLT 423 02/18/2024   Lab Results  Component Value Date   NA 141 02/18/2024   K 4.6 02/18/2024   CO2 22 02/18/2024   GLUCOSE 162 (H) 02/18/2024   BUN 8 02/18/2024   CREATININE 1.16 02/18/2024   BILITOT 0.7 02/18/2024   ALKPHOS 75 02/18/2024   AST 16 02/18/2024   ALT 11 02/18/2024   PROT 6.7 02/18/2024   ALBUMIN 4.3 02/18/2024   CALCIUM  9.8 02/18/2024   ANIONGAP 11 08/06/2017   EGFR 70 02/18/2024   Lab Results  Component Value Date   CHOL 167 02/18/2024   Lab Results  Component Value Date   HDL 40 02/18/2024   Lab Results  Component Value  Date   LDLCALC 96 02/18/2024   Lab Results  Component Value Date   TRIG 178 (H) 02/18/2024   Lab Results  Component Value Date   CHOLHDL 4.2 02/18/2024   Lab Results  Component Value Date   HGBA1C 8.1 (H) 02/18/2024        Results for orders placed or performed in visit on 02/18/24  CBC with Differential/Platelet   Collection Time: 02/18/24 10:42 AM  Result Value Ref Range   WBC 12.6 (H) 3.4 - 10.8 x10E3/uL   RBC 5.04 4.14 - 5.80 x10E6/uL   Hemoglobin 15.7 13.0 - 17.7 g/dL   Hematocrit 52.8 62.4 - 51.0 %   MCV 94 79 - 97 fL   MCH 31.2 26.6 - 33.0 pg   MCHC 33.3 31.5 - 35.7 g/dL   RDW 86.7 88.3 - 84.5 %   Platelets 423 150 - 450 x10E3/uL   Neutrophils 67 Not Estab. %   Lymphs 26 Not Estab. %   Monocytes 6 Not Estab. %   Eos 1 Not Estab. %   Basos 0 Not Estab. %   Neutrophils Absolute 8.4 (H) 1.4 - 7.0 x10E3/uL   Lymphocytes Absolute 3.3 (H) 0.7 - 3.1 x10E3/uL   Monocytes Absolute 0.7 0.1 - 0.9 x10E3/uL   EOS (ABSOLUTE) 0.1 0.0 -  0.4 x10E3/uL   Basophils Absolute 0.0 0.0 - 0.2 x10E3/uL   Immature Granulocytes 0 Not Estab. %   Immature Grans (Abs) 0.0 0.0 - 0.1 x10E3/uL  Comprehensive metabolic panel with GFR   Collection Time: 02/18/24 10:42 AM  Result Value Ref Range   Glucose 162 (H) 70 - 99 mg/dL   BUN 8 8 - 27 mg/dL   Creatinine, Ser 8.83 0.76 - 1.27 mg/dL   eGFR 70 >40 fO/fpw/8.26   BUN/Creatinine Ratio 7 (L) 10 - 24   Sodium 141 134 - 144 mmol/L   Potassium 4.6 3.5 - 5.2 mmol/L   Chloride 100 96 - 106 mmol/L   CO2 22 20 - 29 mmol/L   Calcium  9.8 8.6 - 10.2 mg/dL   Total Protein 6.7 6.0 - 8.5 g/dL   Albumin 4.3 3.9 - 4.9 g/dL   Globulin, Total 2.4 1.5 - 4.5 g/dL   Bilirubin Total 0.7 0.0 - 1.2 mg/dL   Alkaline Phosphatase 75 44 - 121 IU/L   AST 16 0 - 40 IU/L   ALT 11 0 - 44 IU/L  Lipid panel   Collection Time: 02/18/24 10:42 AM  Result Value Ref Range   Cholesterol, Total 167 100 - 199 mg/dL   Triglycerides 821 (H) 0 - 149 mg/dL   HDL 40 >60  mg/dL   VLDL Cholesterol Cal 31 5 - 40 mg/dL   LDL Chol Calc (NIH) 96 0 - 99 mg/dL   Chol/HDL Ratio 4.2 0.0 - 5.0 ratio  Hemoglobin A1c   Collection Time: 02/18/24 10:42 AM  Result Value Ref Range   Hgb A1c MFr Bld 8.1 (H) 4.8 - 5.6 %   Est. average glucose Bld gHb Est-mCnc 186 mg/dL  Microalbumin / creatinine urine ratio   Collection Time: 02/18/24 11:18 AM  Result Value Ref Range   Creatinine, Urine 87.2 Not Estab. mg/dL   Microalbumin, Urine 89.8 Not Estab. ug/mL   Microalb/Creat Ratio 12 0 - 29 mg/g creat     Assessment & Plan:   Assessment & Plan     There is no height or weight on file to calculate BMI.SABRA  Assessment and Plan Assessment & Plan      No orders of the defined types were placed in this encounter.   No orders of the defined types were placed in this encounter.    Follow-up: No follow-ups on file.  An After Visit Summary was printed and given to the patient.  Abigail Free, MD [facility] [phone]

## 2024-04-03 ENCOUNTER — Ambulatory Visit: Admitting: Family Medicine

## 2024-04-11 ENCOUNTER — Ambulatory Visit (INDEPENDENT_AMBULATORY_CARE_PROVIDER_SITE_OTHER): Admitting: Family Medicine

## 2024-04-11 ENCOUNTER — Encounter: Payer: Self-pay | Admitting: Family Medicine

## 2024-04-11 ENCOUNTER — Other Ambulatory Visit: Payer: Self-pay | Admitting: Family Medicine

## 2024-04-11 VITALS — BP 124/68 | HR 101 | Temp 98.2°F | Ht 68.0 in | Wt 284.0 lb

## 2024-04-11 DIAGNOSIS — M545 Low back pain, unspecified: Secondary | ICD-10-CM

## 2024-04-11 MED ORDER — CELECOXIB 200 MG PO CAPS: 200.0000 mg | ORAL_CAPSULE | Freq: Every day | ORAL | 0 refills | Status: DC

## 2024-04-11 NOTE — Progress Notes (Signed)
 Acute Office Visit  Subjective:    Patient ID: Brent Ortiz, male    DOB: [birth year], 63 y.o.   MRN: [id number]  Chief Complaint  Patient presents with   Back Pain    Sharp spasms x 3 weeks mid back radiates to right hip   Discussed the use of AI scribe software for clinical note transcription with the patient, who gave verbal consent to proceed.  History of Present Illness Brent Ortiz is a 63 year old male who presents with worsening lower back pain for three weeks. He is accompanied by his grandson, his second oldest.  Lower back pain - Severe lower back pain for three weeks - Pain is sharp, located on the left side, and occasionally radiates upwards when bending over - Pain intensity rated as 8 out of 10, even with pain medication - Pain is most severe at night, causing sleep disruption; wakes around 2:00 to 2:30 AM and unable to return to sleep - No radiation of pain down the legs - No associated fever, chills, sweats, earaches, sore throat, stuffy nose, chest pain, breathing problems, or abdominal pain - No recent physical therapy - Recalls a slip without a fall  Pain management and medication intolerance - Uses heating pads and patches from the dollar store, which provide warmth but no significant relief - Takes gabapentin  twice daily without effective pain control - Has not tried ibuprofen  or Aleve due to history of stomach ulcers - percocet 5/325 mg one four times a day as needed   Functional status and activity limitation - Does not drive often, primarily stays at home - Drives only short distances when necessary  Cardiac history - History of heart catheterization without stent placement following a heart attack - Echocardiogram and nuclear stress test performed at last visit    Past Medical History:  Diagnosis Date   Acute combined systolic and diastolic CHF, NYHA class 2 (HCC) 01/22/2020   Acute gout of left elbow 02/14/2022   Acute idiopathic gout  of right wrist 12/29/2021   Alcohol  abuse    Atherosclerosis of coronary artery of native heart with stable angina pectoris, unspecified vessel or lesion type 01/23/2016   Overview:  Completely occluded circumflex artery proximal to obtuse marginal 1 basin cardiac catheter from 2017   Atherosclerosis of native coronary artery of native heart without angina pectoris 01/23/2016   Overview:  Completely occluded circumflex artery proximal to obtuse marginal 1 basin cardiac catheter from 2017   Bilateral hip pain 11/17/2021   Bladder incontinence 11/03/2022   BMI 45.0-49.9, adult (HCC) 01/13/2013   Cellulitis of right upper extremity 01/01/2022   Centrilobular emphysema (HCC) 01/16/2022   Chronic kidney disease    Chronic pain 10/19/2019   Sees pain clinic   Chronic pain syndrome 10/19/2019   Sees pain clinic     COPD (chronic obstructive pulmonary disease) (HCC)    COPD exacerbation (HCC) 10/03/2019   Coronary artery disease involving native heart 02/08/2020   Coronary atherosclerosis of native coronary artery 01/23/2016   Overview:  Completely occluded circumflex artery proximal to obtuse marginal 1 basin cardiac catheter from 2017   Depression, major, recurrent, mild 07/11/2020   Depression, major, single episode, moderate (HCC) 07/11/2020   Diabetic polyneuropathy (HCC) 12/06/2019   ED (erectile dysfunction) 12/06/2019   Essential hypertension 01/23/2016   Essential thrombocythemia (HCC) 09/29/2011   GERD (gastroesophageal reflux disease) 11/17/2021   GERD without esophagitis    Gout 02/14/2022   HFrEF (heart failure with reduced  ejection fraction) (HCC) 02/08/2020   Idiopathic chronic gout of multiple 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 Left   Leukocytosis 09/04/2011   Lumbar pain 11/17/2021   Lung nodule 10/13/2022   Mild major depression, single episode 02/14/2022   Mixed hyperlipidemia  06/18/2017   Mixed incontinence 02/24/2022   Morbid obesity (HCC) 11/21/2020   Morbid obesity with BMI of 40.0-44.9, adult (HCC) 01/13/2013   Obstructive chronic bronchitis with exacerbation (HCC) 10/03/2019   Obstructive sleep apnea 07/18/2011   Olecranon bursitis of left elbow 02/14/2022   Olecranon bursitis of left elbow 02/14/2022   Opiate abuse, continuous (HCC)    Osteoarthritis    Pain in knee joint 02/12/2012   Formatting of this note might be different from the original. Left   Personal history of stroke with residual effects 07/24/2021   Polysubstance abuse (HCC)    Poor dentition    Primary insomnia 08/22/2023   Screening for HIV (human immunodeficiency virus) 10/13/2022   Simple chronic bronchitis (HCC) 05/15/2023   Stage 3b chronic kidney disease (HCC) 12/27/2020   Thrombocytosis 09/29/2011   Tobacco dependence 01/23/2016   Tobacco use disorder 01/23/2016   Type 2 diabetes mellitus with diabetic polyneuropathy (HCC)    Uncomplicated opioid dependence (HCC) 03/30/2022    Past Surgical History:  Procedure Laterality Date   HERNIA REPAIR  2009   I & D KNEE WITH POLY EXCHANGE  09/03/2011   Procedure: IRRIGATION AND DEBRIDEMENT KNEE WITH POLY EXCHANGE;  Surgeon: Donnice JONETTA Car, MD;  Location: [facility];  Service: Orthopedics;  Laterality: Left;   TOOTH EXTRACTION Bilateral 08/06/2017   Procedure: DENTAL RESTORATION/EXTRACTIONS;  Surgeon: Sheryle Hamilton, DDS;  Location: [facility];  Service: Oral Surgery;  Laterality: Bilateral;   TOTAL KNEE ARTHROPLASTY   right  feb 2012   left march 2012 r   TOTAL KNEE REVISION  06/23/2011   Procedure: TOTAL KNEE REVISION;  Surgeon: Donnice JONETTA Car;  Location: [facility];  Service: Orthopedics;  Laterality: Left;  femomal nerve block done in holding area without incident    Family History  Problem Relation Age of Onset   Colon cancer Other    Colon cancer Mother     Social History   Socioeconomic History   Marital status: Widowed    Spouse  name: Not on file   Number of children: 5   Years of education: Not on file   Highest education level: Not on file  Occupational History   Occupation: Disabled  Tobacco Use   Smoking status: Every Day    Current packs/day: 1.00    Average packs/day: 1 pack/day for 50.0 years (50.0 ttl pk-yrs)    Types: Cigarettes   Smokeless tobacco: Never  Vaping Use   Vaping status: Former  Substance and Sexual Activity   Alcohol  use: Not on file    Comment: heavy drinker in past. occasionally has a beer. Not on regular basis.   Drug use: Not Currently   Sexual activity: Not Currently  Other Topics Concern   Not on file  Social History Narrative   Not on file   Social Drivers of Health   Financial Resource Strain: Low Risk  (11/19/2023)   Overall Financial Resource Strain (CARDIA)    Difficulty of Paying Living Expenses: Not hard at all  Food Insecurity: No Food Insecurity (11/19/2023)   Hunger Vital Sign    Worried About Programme researcher, broadcasting/film/video in  the Last Year: Never true    Ran Out of Food in the Last Year: Never true  Transportation Needs: No Transportation Needs (11/19/2023)   PRAPARE - Administrator, Civil Service (Medical): No    Lack of Transportation (Non-Medical): No  Physical Activity: Inactive (11/19/2023)   Exercise Vital Sign    Days of Exercise per Week: 0 days    Minutes of Exercise per Session: 0 min  Stress: No Stress Concern Present (11/19/2023)   [employer] of Occupational Health - Occupational Stress Questionnaire    Feeling of Stress : Not at all  Social Connections: Socially Isolated (11/19/2023)   Social Connection and Isolation Panel    Frequency of Communication with Friends and Family: More than three times a week    Frequency of Social Gatherings with Friends and Family: More than three times a week    Attends Religious Services: Never    Database administrator or Organizations: No    Attends Banker Meetings: Never    Marital  Status: Widowed  Intimate Partner Violence: Not At Risk (11/19/2023)   Humiliation, Afraid, Rape, and Kick questionnaire    Fear of Current or Ex-Partner: No    Emotionally Abused: No    Physically Abused: No    Sexually Abused: No    Outpatient Medications Prior to Visit  Medication Sig Dispense Refill   Accu-Chek Softclix Lancets lancets Use as instructed 100 each 5   albuterol  (VENTOLIN  HFA) 108 (90 Base) MCG/ACT inhaler INHALE TWO (2) PUFFS BY MOUTH EVERY 4 HOURS AS NEEDED FOR SHORTNESS OF BREATH/WHEEZING 8.5 g 10   allopurinol  (ZYLOPRIM ) 300 MG tablet TAKE 1 TABLET BY MOUTH ONCE DAILY 30 tablet 10   amLODipine  (NORVASC ) 5 MG tablet TAKE 1 TABLET BY MOUTH ONCE DAILY 90 tablet 11   ammonium lactate  (LAC-HYDRIN ) 12 % lotion Apply 1 Application topically as needed for dry skin. 400 g 5   aspirin  EC (ASPIRIN  LOW DOSE) 81 MG tablet TAKE 1 TABLET BY MOUTH ONCE DAILY *SWALLOW WHOLE* 30 tablet 10   Blood Glucose Monitoring Suppl DEVI 1 each by Does not apply route daily before breakfast. May substitute to any manufacturer covered by patient's insurance. 1 each 0   clopidogrel  (PLAVIX ) 75 MG tablet TAKE 1 TABLET BY MOUTH ONCE DAILY *AUROBINDO BRAND ONLY* 30 tablet 10   ENTRESTO  24-26 MG TAKE ONE (1) TABLET BY MOUTH TWICE DAILY 60 tablet 2   Evolocumab  (REPATHA  SURECLICK) 140 MG/ML SOAJ Inject 140 mg into the skin every 14 (fourteen) days. 6 mL 3   famotidine  (PEPCID ) 20 MG tablet TAKE 1 TABLET BY MOUTH TWICE DAILY 180 tablet 11   Fluticasone-Umeclidin-Vilant (TRELEGY ELLIPTA ) 100-62.5-25 MCG/ACT AEPB Inhale 1 puff into the lungs daily. 3 each 3   gabapentin  (NEURONTIN ) 300 MG capsule TAKE ONE (1) CAPSULE BY MOUTH TWICE DAILY 60 capsule 10   Glucagon, rDNA, (GLUCAGON EMERGENCY) 1 MG KIT Inject 1 mg into the muscle as needed (BG less than 70).     Glucose Blood (BLOOD GLUCOSE TEST STRIPS) STRP Daily before breakfast.May substitute to any manufacturer covered by patient's insurance. 100 strip 3    insulin  glargine (LANTUS  SOLOSTAR) 100 UNIT/ML Solostar Pen INJECT 30 UNITS SUBCUTANEOUSLY ONCE DAILY 15 mL 10   isosorbide  mononitrate (IMDUR ) 60 MG 24 hr tablet TAKE 1 TABLET BY MOUTH ONCE DAILY 90 tablet 11   Lancet Device MISC Daily before breakfast. May substitute to any manufacturer covered by patient's insurance. 1 each 0  Lancets Misc. MISC Daily before breakfast. May substitute to any manufacturer covered by patient's insurance. 100 each 3   lansoprazole  (PREVACID ) 30 MG capsule TAKE 1 CAPSULE BY MOUTH AT NOON 90 capsule 11   metoprolol  succinate (TOPROL -XL) 25 MG 24 hr tablet TAKE 1 TABLET BY MOUTH ONCE DAILY 90 tablet 11   oxyCODONE -acetaminophen  (PERCOCET/ROXICET) 5-325 MG tablet Take 1 tablet by mouth every 6 (six) hours as needed for severe pain (pain score 7-10). TAKE ONE TABLET BY MOUTH FOUR times daily AS DIRECTEDTAKE ONE TABLET BY MOUTH FOUR times daily AS DIRECTED 120 tablet 0   ranolazine  (RANEXA ) 500 MG 12 hr tablet TAKE 1 TABLET BY MOUTH TWICE DAILY 180 tablet 11   rosuvastatin  (CRESTOR ) 40 MG tablet TAKE 1 TABLET BY MOUTH EVERY EVENING 30 tablet 10   Semaglutide , 2 MG/DOSE, 8 MG/3ML SOPN Inject 2 mg as directed once a week. 9 mL 1   sertraline  (ZOLOFT ) 25 MG tablet TAKE 1 TABLET BY MOUTH ONCE DAILY 90 tablet 1   spironolactone  (ALDACTONE ) 25 MG tablet TAKE 1/2 TABLET BY MOUTH ONCE DAILY 15 tablet 10   SURE COMFORT PEN NEEDLES 31G X 8 MM MISC USE AS DIRECTED WITH LANTUS  INSULIN  ONCE DAILY 100 each 11   traZODone  (DESYREL ) 150 MG tablet TAKE 1 TABLET BY MOUTH EVERY DAY AT BEDTIME 90 tablet 1   XIGDUO  XR 04-999 MG TB24 TAKE 1 TABLET BY MOUTH DAILY. 90 tablet 11   No facility-administered medications prior to visit.    Allergies  Allergen Reactions   Motrin  [Ibuprofen ] Other (See Comments)    Reaction: ulcers   Buprenorphine  Itching    Itching and dizziness   Other Nausea And Vomiting    Patient has ulcers    Review of Systems  Constitutional:  Negative for chills,  fatigue and fever.  HENT:  Negative for congestion, ear pain and sore throat.   Respiratory:  Negative for cough and shortness of breath.   Cardiovascular:  Negative for chest pain.  Gastrointestinal:  Negative for abdominal pain, constipation, diarrhea, nausea and vomiting.  Musculoskeletal:  Positive for back pain.  Psychiatric/Behavioral:         No dysphoria       Objective:        04/11/2024    1:19 PM 02/21/2024    1:20 PM 12/17/2023    3:15 PM  Vitals with BMI  Height 5' 8 5' 8   Weight 284 lbs 295 lbs   BMI 43.19 44.86   Systolic 124 124 895  Diastolic 68 72 80  Pulse 101 98 88    No data found.   Physical Exam Vitals reviewed.  Constitutional:      Appearance: Normal appearance.  Cardiovascular:     Rate and Rhythm: Normal rate and regular rhythm.     Heart sounds: Normal heart sounds.  Pulmonary:     Effort: Pulmonary effort is normal.     Breath sounds: Normal breath sounds. No wheezing, rhonchi or rales.  Abdominal:     General: Bowel sounds are normal.     Palpations: Abdomen is soft.     Tenderness: There is no abdominal tenderness.  Musculoskeletal:        General: Tenderness (lumbar BL and midline. neg SLR BL) present.  Neurological:     Mental Status: He is alert.  Psychiatric:        Mood and Affect: Mood normal.        Behavior: Behavior normal.  Health Maintenance Due  Topic Date Due   DTaP/Tdap/Td (1 - Tdap) Never done   Zoster Vaccines- Shingrix (2 of 2) 07/02/2019   OPHTHALMOLOGY EXAM  11/12/2021   Lung Cancer Screening  02/19/2023   COVID-19 Vaccine (6 - Pfizer risk 2024-25 season) 03/06/2024   Medicare Annual Wellness (AWV)  05/10/2024   Colonoscopy  07/06/2024    There are no preventive care reminders to display for this patient.   Lab Results  Component Value Date   TSH 1.140 07/09/2022   Lab Results  Component Value Date   WBC 12.6 (H) 02/18/2024   HGB 15.7 02/18/2024   HCT 47.1 02/18/2024   MCV 94 02/18/2024    PLT 423 02/18/2024   Lab Results  Component Value Date   NA 141 02/18/2024   K 4.6 02/18/2024   CO2 22 02/18/2024   GLUCOSE 162 (H) 02/18/2024   BUN 8 02/18/2024   CREATININE 1.16 02/18/2024   BILITOT 0.7 02/18/2024   ALKPHOS 75 02/18/2024   AST 16 02/18/2024   ALT 11 02/18/2024   PROT 6.7 02/18/2024   ALBUMIN 4.3 02/18/2024   CALCIUM  9.8 02/18/2024   ANIONGAP 11 08/06/2017   EGFR 70 02/18/2024   Lab Results  Component Value Date   CHOL 167 02/18/2024   Lab Results  Component Value Date   HDL 40 02/18/2024   Lab Results  Component Value Date   LDLCALC 96 02/18/2024   Lab Results  Component Value Date   TRIG 178 (H) 02/18/2024   Lab Results  Component Value Date   CHOLHDL 4.2 02/18/2024   Lab Results  Component Value Date   HGBA1C 8.1 (H) 02/18/2024        Results for orders placed or performed in visit on 02/18/24  CBC with Differential/Platelet   Collection Time: 02/18/24 10:42 AM  Result Value Ref Range   WBC 12.6 (H) 3.4 - 10.8 x10E3/uL   RBC 5.04 4.14 - 5.80 x10E6/uL   Hemoglobin 15.7 13.0 - 17.7 g/dL   Hematocrit 52.8 62.4 - 51.0 %   MCV 94 79 - 97 fL   MCH 31.2 26.6 - 33.0 pg   MCHC 33.3 31.5 - 35.7 g/dL   RDW 86.7 88.3 - 84.5 %   Platelets 423 150 - 450 x10E3/uL   Neutrophils 67 Not Estab. %   Lymphs 26 Not Estab. %   Monocytes 6 Not Estab. %   Eos 1 Not Estab. %   Basos 0 Not Estab. %   Neutrophils Absolute 8.4 (H) 1.4 - 7.0 x10E3/uL   Lymphocytes Absolute 3.3 (H) 0.7 - 3.1 x10E3/uL   Monocytes Absolute 0.7 0.1 - 0.9 x10E3/uL   EOS (ABSOLUTE) 0.1 0.0 - 0.4 x10E3/uL   Basophils Absolute 0.0 0.0 - 0.2 x10E3/uL   Immature Granulocytes 0 Not Estab. %   Immature Grans (Abs) 0.0 0.0 - 0.1 x10E3/uL  Comprehensive metabolic panel with GFR   Collection Time: 02/18/24 10:42 AM  Result Value Ref Range   Glucose 162 (H) 70 - 99 mg/dL   BUN 8 8 - 27 mg/dL   Creatinine, Ser 8.83 0.76 - 1.27 mg/dL   eGFR 70 >40 fO/fpw/8.26   BUN/Creatinine  Ratio 7 (L) 10 - 24   Sodium 141 134 - 144 mmol/L   Potassium 4.6 3.5 - 5.2 mmol/L   Chloride 100 96 - 106 mmol/L   CO2 22 20 - 29 mmol/L   Calcium  9.8 8.6 - 10.2 mg/dL   Total Protein 6.7 6.0 -  8.5 g/dL   Albumin 4.3 3.9 - 4.9 g/dL   Globulin, Total 2.4 1.5 - 4.5 g/dL   Bilirubin Total 0.7 0.0 - 1.2 mg/dL   Alkaline Phosphatase 75 44 - 121 IU/L   AST 16 0 - 40 IU/L   ALT 11 0 - 44 IU/L  Lipid panel   Collection Time: 02/18/24 10:42 AM  Result Value Ref Range   Cholesterol, Total 167 100 - 199 mg/dL   Triglycerides 821 (H) 0 - 149 mg/dL   HDL 40 >60 mg/dL   VLDL Cholesterol Cal 31 5 - 40 mg/dL   LDL Chol Calc (NIH) 96 0 - 99 mg/dL   Chol/HDL Ratio 4.2 0.0 - 5.0 ratio  Hemoglobin A1c   Collection Time: 02/18/24 10:42 AM  Result Value Ref Range   Hgb A1c MFr Bld 8.1 (H) 4.8 - 5.6 %   Est. average glucose Bld gHb Est-mCnc 186 mg/dL  Microalbumin / creatinine urine ratio   Collection Time: 02/18/24 11:18 AM  Result Value Ref Range   Creatinine, Urine 87.2 Not Estab. mg/dL   Microalbumin, Urine 89.8 Not Estab. ug/mL   Microalb/Creat Ratio 12 0 - 29 mg/g creat     Assessment & Plan:   Assessment & Plan Lumbar back pain Chronic low back pain Chronic low back pain, left-sided, severe, non-radicular. Gabapentin  ineffective. Allergic to ibuprofen . High dose narcotics used. - Order x-ray of lumbar spine. - Prescribe Celebrex  for two weeks. - Discuss potential physical therapy post x-ray. - Consider MRI and cortisone injections if pain persists. - Discuss home health care physical therapy if transportation is an issue. Orders:   DG Lumbar Spine Complete; Future   celecoxib  (CELEBREX ) 200 MG capsule; Take 1 capsule (200 mg total) by mouth daily.    Body mass index is 43.18 kg/m..   Meds ordered this encounter  Medications   celecoxib  (CELEBREX ) 200 MG capsule    Sig: Take 1 capsule (200 mg total) by mouth daily.    Dispense:  60 capsule    Refill:  0    Orders Placed  This Encounter  Procedures   DG Lumbar Spine Complete     Follow-up: No follow-ups on file.  An After Visit Summary was printed and given to the patient.  Abigail Free, MD [facility] [phone]

## 2024-04-11 NOTE — Patient Instructions (Signed)
  VISIT SUMMARY: You visited us  today due to severe lower back pain that has been troubling you for the past three weeks. We discussed your pain management options and reviewed your cardiac history.  YOUR PLAN: CHRONIC LOW BACK PAIN: You have severe, non-radiating lower back pain on the left side that has not improved with your current medication. -We will order an x-ray of your lumbar spine to get a better understanding of the issue. -You will start taking Celebrex  for two weeks to help manage the pain. -Depending on the x-ray results, we may consider physical therapy, an MRI, or cortisone injections if the pain continues. -If transportation is a problem, we can discuss home health care physical therapy.

## 2024-04-18 ENCOUNTER — Other Ambulatory Visit: Payer: Self-pay | Admitting: Family Medicine

## 2024-04-18 DIAGNOSIS — F5101 Primary insomnia: Secondary | ICD-10-CM

## 2024-04-18 NOTE — Progress Notes (Signed)
 KODEY [name]                                          MRN: [id number]   04/18/2024   The [facility] Quality Team Specialist reviewed this patient medical record for the purposes of chart review for care gap closure. The following were reviewed: chart review for care gap closure-diabetic eye exam.Navarro JAIEL SARACENO                                          MRN: [id number]   04/18/2024   The [facility] Quality Team Specialist reviewed this patient medical record for the purposes of chart review for care gap closure. The following were reviewed: abstraction for care gap closure-glycemic status assessment.    [facility] Quality Team

## 2024-04-19 ENCOUNTER — Other Ambulatory Visit: Payer: Self-pay | Admitting: Family Medicine

## 2024-04-19 DIAGNOSIS — M545 Low back pain, unspecified: Secondary | ICD-10-CM

## 2024-04-19 MED ORDER — OXYCODONE-ACETAMINOPHEN 5-325 MG PO TABS: 1.0000 | ORAL_TABLET | Freq: Four times a day (QID) | ORAL | 0 refills | Status: DC | PRN

## 2024-04-19 NOTE — Telephone Encounter (Signed)
 Copied from CRM #[id number]. Topic: Clinical - Medication Refill >> Apr 19, 2024 10:39 AM Nathanel BROCKS wrote: Medication: oxyCODONE -acetaminophen  (PERCOCET/ROXICET) 5-325 MG tablet  Has the patient contacted their pharmacy? No  This is the patient's preferred pharmacy:  [id number] [place] - [address] [place] [address] Phone: [phone] Fax: [phone]  Is this the correct pharmacy for this prescription? Yes If no, delete pharmacy and type the correct one.   Has the prescription been filled recently? Yes  Is the patient out of the medication? Yes  Has the patient been seen for an appointment in the last year OR does the patient have an upcoming appointment? Yes  Can we respond through MyChart? No  Agent: Please be advised that Rx refills may take up to 3 business days. We ask that you follow-up with your pharmacy.

## 2024-05-15 ENCOUNTER — Other Ambulatory Visit: Payer: Self-pay | Admitting: Family Medicine

## 2024-05-15 DIAGNOSIS — M545 Low back pain, unspecified: Secondary | ICD-10-CM

## 2024-05-15 NOTE — Telephone Encounter (Signed)
 Copied from CRM [id number]. Topic: Clinical - Medication Refill >> May 15, 2024  4:27 PM Wess RAMAN wrote: Medication: oxyCODONE -acetaminophen  (PERCOCET/ROXICET) 5-325 MG tablet   Has the patient contacted their pharmacy? No (Agent: If no, request that the patient contact the pharmacy for the refill. If patient does not wish to contact the pharmacy document the reason why and proceed with request.) (Agent: If yes, when and what did the pharmacy advise?)  This is the patient's preferred pharmacy:  [facility] #[id number], [place] - [address] [place] [address] Phone: [phone] Fax: [phone]  Is this the correct pharmacy for this prescription? Yes If no, delete pharmacy and type the correct one.   Has the prescription been filled recently? Yes  Is the patient out of the medication? No  Has the patient been seen for an appointment in the last year OR does the patient have an upcoming appointment? Yes  Can we respond through MyChart? No  Agent: Please be advised that Rx refills may take up to 3 business days. We ask that you follow-up with your pharmacy.

## 2024-05-16 MED ORDER — OXYCODONE-ACETAMINOPHEN 5-325 MG PO TABS
1.0000 | ORAL_TABLET | Freq: Four times a day (QID) | ORAL | 0 refills | Status: AC | PRN
Start: 2024-05-16 — End: ?

## 2024-05-17 ENCOUNTER — Other Ambulatory Visit: Payer: Self-pay | Admitting: Family Medicine

## 2024-05-22 NOTE — Assessment & Plan Note (Addendum)
 Blood pressure managed with Entresto , Toprol  XL, and isosorbide  mononitrate. Reports occasional shortness of breath when walking. - Continue current antihypertensive regimen. Orders:   CBC with Differential/Platelet   Comprehensive metabolic panel with GFR

## 2024-05-22 NOTE — Assessment & Plan Note (Addendum)
 Diabetes management suboptimal with A1c of 8.4. Discontinued Ozempic  due to lack of weight loss, A1c worsened. Not regularly checking blood glucose due to missing supplies despite me sending to pharmacy after last visit. No diabetes education attended ever. - Referred to diabetes education at [place]. - Provided Ozempic  samples:Take Ozempic  0.25 mg weekly x 2 weeks then increase to 0.5 mg weekly until gone, then restart Ozempic  2 mg weekly. If issues jumping to 2 mg, such as nausea, let us  know and I will send 1 mg weekly for a month. - Ensure blood glucose monitoring supplies obtained from pharmacy. - Encouraged dietary modifications, including reducing soda intake and considering diet sodas. -Schedule your eye appointment THIS WEEK.   Orders:   POCT glycosylated hemoglobin (Hb A1C)   Ambulatory referral to diabetic education

## 2024-05-22 NOTE — Assessment & Plan Note (Addendum)
 Triglycerides increased to 284, above target. LDL improved slightly. On Repatha  and Crestor . - Prescribed prescription fish oil for triglyceride management. - Continue Repatha  and Crestor . Orders:   POCT Lipid Panel   omega-3 acid ethyl esters (LOVAZA ) 1 g capsule; Take 2 capsules (2 g total) by mouth 2 (two) times daily.

## 2024-05-22 NOTE — Progress Notes (Signed)
 Subjective:  Patient ID: Brent Ortiz, male    DOB: 1960/07/18  Age: 63 y.o. MRN: [id number]  Chief Complaint  Patient presents with   Medical Management of Chronic Issues    HPI: Discussed the use of AI scribe software for clinical note transcription with the patient, who gave verbal consent to proceed.  History of Present Illness Brent Ortiz is a 63 year old male with diabetes and heart disease who presents for a regular follow-up visit.  Chronic pain - Experiences mild pain, described as 'a little normal pain' - Manages pain with morning pain medication, including Celebrex  and Percocet - No new or worsening pain symptoms  Dyspnea on exertion - Shortness of breath primarily when walking - Uses albuterol  inhaler when experiencing shortness of breath, especially when walking to his car - Uses Trelegy inhaler  Glycemic management - Not currently checking blood glucose due to missing supplies for glucose meter - Received blood glucose meter and supplies in August but unsure if all components were provided - On Xigduo  XR 04/999 mg once daily and Lantus  insulin  30 units daily. - Not taking Ozempic  recently due to lack of perceived weight loss as he did not understand the importance to his diabetes.  -On gabapentin  - Diet includes frequent soda consumption, minimal sugar or fried food, and typically one meal per day in the evening  Cardiovascular disease management - On Repatha , Crestor ,  Entresto , Toprol  XL, isosorbide  mononitrate, Plavix , and spironolactone .4 -See doses below.  Respiratory health and lung cancer screening - Has not completed lung cancer screening CT scan ordered in August and February  Ophthalmologic care - No eye appointment since 2022 - Patient says he is going to make an appointment  GERD:  On famotidine  20 twice daily, and lansoprazole  30 twice daily  Depression/insomnia - Takes Zoloft  and trazodone  at night  Tobacco use - Smokes  approximately one pack per day - Has considered quitting and previously abstained for a few months  General symptoms - No fevers, chills, sweats, earaches, sore throat, or stuffy nose - Experiences dry mouth frequently and uses ice to alleviate it       02/21/2024    1:23 PM 11/19/2023    9:46 AM 08/18/2023   10:20 AM 05/11/2023   10:25 AM 02/01/2023   10:33 AM  Depression screen PHQ 2/9  Decreased Interest 0 0 0 1 3  Down, Depressed, Hopeless 0 0 0 1 1  PHQ - 2 Score 0 0 0 2 4  Altered sleeping 0 0 0 0 0  Tired, decreased energy 0 0 0 0 2  Change in appetite 0 0 0 0 2  Feeling bad or failure about yourself  0 0 0 1 2  Trouble concentrating 0 0 0 0 1  Moving slowly or fidgety/restless 0 0 0 1 1  Suicidal thoughts 0 0 0 0 0  PHQ-9 Score 0  0  0  4  12   Difficult doing work/chores Not difficult at all Not difficult at all Not difficult at all Not difficult at all Somewhat difficult     Data saved with a previous flowsheet row definition        02/21/2024    1:23 PM  Fall Risk   Falls in the past year? 0  Number falls in past yr: 0  Injury with Fall? 0  Risk for fall due to : No Fall Risks  Follow up Falls evaluation completed    Patient Care Team:  Hiliana Eilts, Abigail, MD as PCP - General (Family Medicine) [name], Jennifer SAUNDERS, MD as Consulting Physician (Cardiology) Gaynel Delon CROME, DPM as Consulting Physician (Podiatry)   Review of Systems  Constitutional:  Negative for appetite change, fatigue and fever.  HENT:  Negative for congestion, ear pain, sinus pressure and sore throat.   Eyes: Negative.   Respiratory:  Negative for cough, chest tightness, shortness of breath and wheezing.   Cardiovascular:  Negative for chest pain and palpitations.  Gastrointestinal:  Negative for abdominal pain, constipation, diarrhea, nausea and vomiting.  Endocrine: Negative.   Genitourinary:  Negative for dysuria and hematuria.  Musculoskeletal:  Positive for back pain. Negative for  arthralgias, joint swelling and myalgias.  Allergic/Immunologic: Negative.   Neurological:  Negative for dizziness, weakness, light-headedness and headaches.  Psychiatric/Behavioral:  Negative for dysphoric mood. The patient is not nervous/anxious.     Current Outpatient Medications on File Prior to Visit  Medication Sig Dispense Refill   Accu-Chek Softclix Lancets lancets Use as instructed 100 each 5   albuterol  (VENTOLIN  HFA) 108 (90 Base) MCG/ACT inhaler INHALE TWO (2) PUFFS BY MOUTH EVERY 4 HOURS AS NEEDED FOR SHORTNESS OF BREATH/WHEEZING 8.5 g 10   allopurinol  (ZYLOPRIM ) 300 MG tablet TAKE 1 TABLET BY MOUTH ONCE DAILY 30 tablet 10   amLODipine  (NORVASC ) 5 MG tablet TAKE 1 TABLET BY MOUTH ONCE DAILY 90 tablet 11   ammonium lactate  (LAC-HYDRIN ) 12 % lotion Apply 1 Application topically as needed for dry skin. 400 g 5   aspirin  EC (ASPIRIN  LOW DOSE) 81 MG tablet TAKE 1 TABLET BY MOUTH ONCE DAILY *SWALLOW WHOLE* 30 tablet 10   Blood Glucose Monitoring Suppl DEVI 1 each by Does not apply route daily before breakfast. May substitute to any manufacturer covered by patient's insurance. 1 each 0   celecoxib  (CELEBREX ) 200 MG capsule Take 1 capsule (200 mg total) by mouth daily. 60 capsule 0   clopidogrel  (PLAVIX ) 75 MG tablet TAKE 1 TABLET BY MOUTH ONCE DAILY *AUROBINDO BRAND ONLY* 30 tablet 10   ENTRESTO  24-26 MG TAKE 1 TABLET BY MOUTH TWICE DAILY 60 tablet 11   Evolocumab  (REPATHA  SURECLICK) 140 MG/ML SOAJ Inject 140 mg into the skin every 14 (fourteen) days. 6 mL 3   famotidine  (PEPCID ) 20 MG tablet TAKE 1 TABLET BY MOUTH TWICE DAILY 180 tablet 11   Fluticasone-Umeclidin-Vilant (TRELEGY ELLIPTA ) 100-62.5-25 MCG/ACT AEPB Inhale 1 puff into the lungs daily. 3 each 3   gabapentin  (NEURONTIN ) 300 MG capsule TAKE ONE (1) CAPSULE BY MOUTH TWICE DAILY 60 capsule 10   Glucagon, rDNA, (GLUCAGON EMERGENCY) 1 MG KIT Inject 1 mg into the muscle as needed (BG less than 70).     Glucose Blood (BLOOD GLUCOSE  TEST STRIPS) STRP Daily before breakfast.May substitute to any manufacturer covered by patient's insurance. 100 strip 3   insulin  glargine (LANTUS  SOLOSTAR) 100 UNIT/ML Solostar Pen INJECT 30 UNITS SUBCUTANEOUSLY ONCE DAILY 15 mL 10   isosorbide  mononitrate (IMDUR ) 60 MG 24 hr tablet TAKE 1 TABLET BY MOUTH ONCE DAILY 90 tablet 11   Lancet Device MISC Daily before breakfast. May substitute to any manufacturer covered by patient's insurance. 1 each 0   Lancets Misc. MISC Daily before breakfast. May substitute to any manufacturer covered by patient's insurance. 100 each 3   lansoprazole  (PREVACID ) 30 MG capsule TAKE 1 CAPSULE BY MOUTH AT NOON 90 capsule 11   metoprolol  succinate (TOPROL -XL) 25 MG 24 hr tablet TAKE 1 TABLET BY MOUTH ONCE DAILY 90 tablet 11  oxyCODONE -acetaminophen  (PERCOCET/ROXICET) 5-325 MG tablet Take 1 tablet by mouth every 6 (six) hours as needed for severe pain (pain score 7-10). TAKE ONE TABLET BY MOUTH FOUR times daily AS DIRECTEDTAKE ONE TABLET BY MOUTH FOUR times daily AS DIRECTED 120 tablet 0   ranolazine  (RANEXA ) 500 MG 12 hr tablet TAKE 1 TABLET BY MOUTH TWICE DAILY 180 tablet 11   rosuvastatin  (CRESTOR ) 40 MG tablet TAKE 1 TABLET BY MOUTH EVERY EVENING 30 tablet 11   Semaglutide , 2 MG/DOSE, 8 MG/3ML SOPN Inject 2 mg as directed once a week. 9 mL 1   sertraline  (ZOLOFT ) 25 MG tablet TAKE 1 TABLET BY MOUTH ONCE DAILY 90 tablet 1   spironolactone  (ALDACTONE ) 25 MG tablet TAKE 1/2 TABLET BY MOUTH ONCE DAILY 15 tablet 10   SURE COMFORT PEN NEEDLES 31G X 8 MM MISC USE AS DIRECTED WITH LANTUS  INSULIN  ONCE DAILY 100 each 11   traZODone  (DESYREL ) 150 MG tablet TAKE 1 TABLET BY MOUTH EVERY DAY AT BEDTIME 90 tablet 11   XIGDUO  XR 04-999 MG TB24 TAKE 1 TABLET BY MOUTH DAILY. 90 tablet 11   No current facility-administered medications on file prior to visit.   Past Medical History:  Diagnosis Date   Acute combined systolic and diastolic CHF, NYHA class 2 (HCC) 01/22/2020   Acute  gout of left elbow 02/14/2022   Acute idiopathic gout of right wrist 12/29/2021   Alcohol  abuse    Atherosclerosis of coronary artery of native heart with stable angina pectoris, unspecified vessel or lesion type 01/23/2016   Overview:  Completely occluded circumflex artery proximal to obtuse marginal 1 basin cardiac catheter from 2017   Atherosclerosis of native coronary artery of native heart without angina pectoris 01/23/2016   Overview:  Completely occluded circumflex artery proximal to obtuse marginal 1 basin cardiac catheter from 2017   Bilateral hip pain 11/17/2021   Bladder incontinence 11/03/2022   BMI 45.0-49.9, adult (HCC) 01/13/2013   Cellulitis of right upper extremity 01/01/2022   Centrilobular emphysema (HCC) 01/16/2022   Chronic kidney disease    Chronic pain 10/19/2019   Sees pain clinic   Chronic pain syndrome 10/19/2019   Sees pain clinic     COPD (chronic obstructive pulmonary disease) (HCC)    COPD exacerbation (HCC) 10/03/2019   Coronary artery disease involving native heart 02/08/2020   Coronary atherosclerosis of native coronary artery 01/23/2016   Overview:  Completely occluded circumflex artery proximal to obtuse marginal 1 basin cardiac catheter from 2017   Depression, major, recurrent, mild 07/11/2020   Depression, major, single episode, moderate (HCC) 07/11/2020   Diabetic polyneuropathy (HCC) 12/06/2019   ED (erectile dysfunction) 12/06/2019   Essential hypertension 01/23/2016   Essential thrombocythemia (HCC) 09/29/2011   GERD (gastroesophageal reflux disease) 11/17/2021   GERD without esophagitis    Gout 02/14/2022   HFrEF (heart failure with reduced ejection fraction) (HCC) 02/08/2020   Idiopathic chronic gout of multiple 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 Left   Leukocytosis 09/04/2011   Lumbar pain 11/17/2021   Lung nodule 10/13/2022   Mild major  depression, single episode 02/14/2022   Mixed hyperlipidemia 06/18/2017   Mixed incontinence 02/24/2022   Morbid obesity (HCC) 11/21/2020   Morbid obesity with BMI of 40.0-44.9, adult (HCC) 01/13/2013   Obstructive chronic bronchitis with exacerbation (HCC) 10/03/2019   Obstructive sleep apnea 07/18/2011   Olecranon bursitis of left elbow 02/14/2022   Olecranon bursitis  of left elbow 02/14/2022   Opiate abuse, continuous (HCC)    Osteoarthritis    Pain in knee joint 02/12/2012   Formatting of this note might be different from the original. Left   Personal history of stroke with residual effects 07/24/2021   Polysubstance abuse (HCC)    Poor dentition    Primary insomnia 08/22/2023   Screening for HIV (human immunodeficiency virus) 10/13/2022   Simple chronic bronchitis (HCC) 05/15/2023   Stage 3b chronic kidney disease (HCC) 12/27/2020   Thrombocytosis 09/29/2011   Tobacco dependence 01/23/2016   Tobacco use disorder 01/23/2016   Type 2 diabetes mellitus with diabetic polyneuropathy (HCC)    Uncomplicated opioid dependence (HCC) 03/30/2022   Past Surgical History:  Procedure Laterality Date   HERNIA REPAIR  2009   I & D KNEE WITH POLY EXCHANGE  09/03/2011   Procedure: IRRIGATION AND DEBRIDEMENT KNEE WITH POLY EXCHANGE;  Surgeon: Donnice JONETTA Car, MD;  Location: [facility];  Service: Orthopedics;  Laterality: Left;   TOOTH EXTRACTION Bilateral 08/06/2017   Procedure: DENTAL RESTORATION/EXTRACTIONS;  Surgeon: Sheryle Hamilton, DDS;  Location: [facility] OR;  Service: Oral Surgery;  Laterality: Bilateral;   TOTAL KNEE ARTHROPLASTY   right  feb 2012   left march 2012 r   TOTAL KNEE REVISION  06/23/2011   Procedure: TOTAL KNEE REVISION;  Surgeon: Donnice JONETTA Car;  Location: [facility];  Service: Orthopedics;  Laterality: Left;  femomal nerve block done in holding area without incident    Family History  Problem Relation Age of Onset   Colon cancer Other    Colon cancer Mother    Social History    Socioeconomic History   Marital status: Widowed    Spouse name: Not on file   Number of children: 5   Years of education: Not on file   Highest education level: Not on file  Occupational History   Occupation: Disabled  Tobacco Use   Smoking status: Every Day    Current packs/day: 1.00    Average packs/day: 1 pack/day for 50.0 years (50.0 ttl pk-yrs)    Types: Cigarettes   Smokeless tobacco: Never  Vaping Use   Vaping status: Former  Substance and Sexual Activity   Alcohol  use: Not on file    Comment: heavy drinker in past. occasionally has a beer. Not on regular basis.   Drug use: Not Currently   Sexual activity: Not Currently  Other Topics Concern   Not on file  Social History Narrative   Not on file   Social Drivers of Health   Financial Resource Strain: Low Risk  (11/19/2023)   Overall Financial Resource Strain (CARDIA)    Difficulty of Paying Living Expenses: Not hard at all  Food Insecurity: No Food Insecurity (11/19/2023)   Hunger Vital Sign    Worried About Running Out of Food in the Last Year: Never true    Ran Out of Food in the Last Year: Never true  Transportation Needs: No Transportation Needs (11/19/2023)   PRAPARE - Administrator, Civil Service (Medical): No    Lack of Transportation (Non-Medical): No  Physical Activity: Inactive (11/19/2023)   Exercise Vital Sign    Days of Exercise per Week: 0 days    Minutes of Exercise per Session: 0 min  Stress: No Stress Concern Present (11/19/2023)   [employer] of Occupational Health - Occupational Stress Questionnaire    Feeling of Stress : Not at all  Social Connections: Socially Isolated (11/19/2023)   Social  Connection and Isolation Panel    Frequency of Communication with Friends and Family: More than three times a week    Frequency of Social Gatherings with Friends and Family: More than three times a week    Attends Religious Services: Never    Database Administrator or Organizations: No     Attends Banker Meetings: Never    Marital Status: Widowed    Objective:  BP 110/70   Pulse 84   Temp 98 F (36.7 C) (Temporal)   Resp 18   Ht 5' 8 (1.727 m)   Wt 289 lb 6.4 oz (131.3 kg)   SpO2 93%   BMI 44.00 kg/m      05/23/2024   10:21 AM 04/11/2024    1:19 PM 02/21/2024    1:20 PM  BP/Weight  Systolic BP 110 124 124  Diastolic BP 70 68 72  Wt. (Lbs) 289.4 284 295  BMI 44 kg/m2 43.18 kg/m2 44.85 kg/m2    Physical Exam Vitals reviewed.  Constitutional:      Appearance: Normal appearance. He is obese.  Neck:     Vascular: No carotid bruit.  Cardiovascular:     Rate and Rhythm: Normal rate and regular rhythm.     Pulses: Normal pulses.     Heart sounds: Normal heart sounds.  Pulmonary:     Effort: Pulmonary effort is normal.     Breath sounds: Normal breath sounds. No wheezing, rhonchi or rales.  Abdominal:     General: Bowel sounds are normal.     Palpations: Abdomen is soft.     Tenderness: There is no abdominal tenderness.  Musculoskeletal:        General: Tenderness (lumbar.) present.  Neurological:     Mental Status: He is alert and oriented to person, place, and time.  Psychiatric:        Mood and Affect: Mood normal.        Behavior: Behavior normal.      Diabetic foot exam was performed with the following findings:   No deformities, ulcerations, or other skin breakdown Normal sensation of 10g monofilament Intact posterior tibialis and dorsalis pedis pulses      Lab Results  Component Value Date   WBC 12.7 (H) 05/23/2024   HGB 16.8 05/23/2024   HCT 50.0 05/23/2024   PLT 384 05/23/2024   GLUCOSE 161 (H) 05/23/2024   CHOL 167 02/18/2024   TRIG 178 (H) 02/18/2024   HDL 40 02/18/2024   LDLCALC 96 02/18/2024   ALT 7 05/23/2024   AST 8 05/23/2024   NA 138 05/23/2024   K 4.7 05/23/2024   CL 99 05/23/2024   CREATININE 1.23 05/23/2024   BUN 10 05/23/2024   CO2 25 05/23/2024   TSH 1.140 07/09/2022   INR 1.81 (H)  09/07/2011   HGBA1C 8.4 (A) 05/23/2024    Results for orders placed or performed in visit on 05/23/24  POCT Lipid Panel   Collection Time: 05/23/24 10:39 AM  Result Value Ref Range   TC 170    HDL 30    TRG 284    LDL 83    Non-HDL 140    TC/HDL 2.8   POCT glycosylated hemoglobin (Hb A1C)   Collection Time: 05/23/24 10:39 AM  Result Value Ref Range   Hemoglobin A1C 8.4 (A) 4.0 - 5.6 %   HbA1c POC (<> result, manual entry)     HbA1c, POC (prediabetic range)     HbA1c, POC (controlled diabetic  range)    CBC with Differential/Platelet   Collection Time: 05/23/24 11:20 AM  Result Value Ref Range   WBC 12.7 (H) 3.4 - 10.8 x10E3/uL   RBC 5.55 4.14 - 5.80 x10E6/uL   Hemoglobin 16.8 13.0 - 17.7 g/dL   Hematocrit 49.9 62.4 - 51.0 %   MCV 90 79 - 97 fL   MCH 30.3 26.6 - 33.0 pg   MCHC 33.6 31.5 - 35.7 g/dL   RDW 87.2 88.3 - 84.5 %   Platelets 384 150 - 450 x10E3/uL   Neutrophils 71 Not Estab. %   Lymphs 22 Not Estab. %   Monocytes 6 Not Estab. %   Eos 1 Not Estab. %   Basos 0 Not Estab. %   Neutrophils Absolute 9.0 (H) 1.4 - 7.0 x10E3/uL   Lymphocytes Absolute 2.7 0.7 - 3.1 x10E3/uL   Monocytes Absolute 0.8 0.1 - 0.9 x10E3/uL   EOS (ABSOLUTE) 0.1 0.0 - 0.4 x10E3/uL   Basophils Absolute 0.1 0.0 - 0.2 x10E3/uL   Immature Granulocytes 0 Not Estab. %   Immature Grans (Abs) 0.0 0.0 - 0.1 x10E3/uL  Comprehensive metabolic panel with GFR   Collection Time: 05/23/24 11:20 AM  Result Value Ref Range   Glucose 161 (H) 70 - 99 mg/dL   BUN 10 8 - 27 mg/dL   Creatinine, Ser 8.76 0.76 - 1.27 mg/dL   eGFR 65 >40 fO/fpw/8.26   BUN/Creatinine Ratio 8 (L) 10 - 24   Sodium 138 134 - 144 mmol/L   Potassium 4.7 3.5 - 5.2 mmol/L   Chloride 99 96 - 106 mmol/L   CO2 25 20 - 29 mmol/L   Calcium  10.4 (H) 8.6 - 10.2 mg/dL   Total Protein 7.0 6.0 - 8.5 g/dL   Albumin 4.5 3.9 - 4.9 g/dL   Globulin, Total 2.5 1.5 - 4.5 g/dL   Bilirubin Total 0.6 0.0 - 1.2 mg/dL   Alkaline Phosphatase 90 47 -  123 IU/L   AST 8 0 - 40 IU/L   ALT 7 0 - 44 IU/L  .  Assessment & Plan:   Assessment & Plan Type 2 diabetes mellitus with diabetic polyneuropathy, with long-term current use of insulin  (HCC) Diabetes management suboptimal with A1c of 8.4. Discontinued Ozempic  due to lack of weight loss, A1c worsened. Not regularly checking blood glucose due to missing supplies despite me sending to pharmacy after last visit. No diabetes education attended ever. - Referred to diabetes education at [place]. - Provided Ozempic  samples:Take Ozempic  0.25 mg weekly x 2 weeks then increase to 0.5 mg weekly until gone, then restart Ozempic  2 mg weekly. If issues jumping to 2 mg, such as nausea, let us  know and I will send 1 mg weekly for a month. - Ensure blood glucose monitoring supplies obtained from pharmacy. - Encouraged dietary modifications, including reducing soda intake and considering diet sodas. -Schedule your eye appointment THIS WEEK.   Orders:   POCT glycosylated hemoglobin (Hb A1C)   Ambulatory referral to diabetic education  Essential hypertension Blood pressure managed with Entresto , Toprol  XL, and isosorbide  mononitrate. Reports occasional shortness of breath when walking. - Continue current antihypertensive regimen. Orders:   CBC with Differential/Platelet   Comprehensive metabolic panel with GFR  Mixed hyperlipidemia Triglycerides increased to 284, above target. LDL improved slightly. On Repatha  and Crestor . - Prescribed prescription fish oil for triglyceride management. - Continue Repatha  and Crestor . Orders:   POCT Lipid Panel   omega-3 acid ethyl esters (LOVAZA ) 1 g capsule; Take 2 capsules (  2 g total) by mouth 2 (two) times daily.  Chronic midline low back pain without sciatica Managed with Celebrex  and Percocet. - pain contract in place.     Simple chronic bronchitis (HCC) Managed with Trelegy and albuterol . Uses albuterol  for shortness of breath when walking. - Continue  Trelegy and albuterol  as needed. - Smokes approximately a pack a day. Has considered quitting and previously quit for a few months. Encouraged smoking cessation. Discussed for 5 minutes. - Consider lung cancer screening in the future with a CT scan. Patient has not kept appointments made in the past.     Idiopathic chronic gout of multiple sites without tophus Managed with allopurinol . - Continue allopurinol .    GERD without esophagitis Managed with lansoprazole  and famotidine . - Continue lansoprazole  and famotidine .    Depression, major, recurrent, mild Managed with Zoloft  and trazodone . Reports no symptoms of depression or anxiety. - Continue Zoloft  and trazodone .    Encounter for immunization  Orders:   Flu vaccine HIGH DOSE PF(Fluzone Trivalent)  Encounter for immunization  Orders:   Pfizer Comirnaty Covid-19 Vaccine 74yrs & older   Body mass index is 44 kg/m.   Meds ordered this encounter  Medications   omega-3 acid ethyl esters (LOVAZA ) 1 g capsule    Sig: Take 2 capsules (2 g total) by mouth 2 (two) times daily.    Dispense:  360 capsule    Refill:  0    Orders Placed This Encounter  Procedures   Flu vaccine HIGH DOSE PF(Fluzone Trivalent)   Pfizer Comirnaty Covid-19 Vaccine 55yrs & older   CBC with Differential/Platelet   Comprehensive metabolic panel with GFR   Ambulatory referral to diabetic education   POCT Lipid Panel   POCT glycosylated hemoglobin (Hb A1C)     I,Marla I Leal-Borjas,acting as a scribe for Abigail Free, MD.,have documented all relevant documentation on the behalf of Abigail Free, MD,as directed by  Abigail Free, MD while in the presence of Abigail Free, MD.   Follow-up: Return in about 3 months (around 08/23/2024) for chronic fasting, [email or web address]  An After Visit Summary was printed and given to the patient.  Abigail Free, MD [facility] [phone]

## 2024-05-23 ENCOUNTER — Ambulatory Visit: Admitting: Family Medicine

## 2024-05-23 ENCOUNTER — Encounter: Payer: Self-pay | Admitting: Family Medicine

## 2024-05-23 ENCOUNTER — Ambulatory Visit: Payer: Self-pay | Admitting: Family Medicine

## 2024-05-23 ENCOUNTER — Telehealth: Payer: Self-pay

## 2024-05-23 VITALS — BP 110/70 | HR 84 | Temp 98.0°F | Resp 18 | Ht 68.0 in | Wt 289.4 lb

## 2024-05-23 DIAGNOSIS — E782 Mixed hyperlipidemia: Secondary | ICD-10-CM

## 2024-05-23 DIAGNOSIS — M1A09X0 Idiopathic chronic gout, multiple sites, without tophus (tophi): Secondary | ICD-10-CM

## 2024-05-23 DIAGNOSIS — G8929 Other chronic pain: Secondary | ICD-10-CM

## 2024-05-23 DIAGNOSIS — Z794 Long term (current) use of insulin: Secondary | ICD-10-CM

## 2024-05-23 DIAGNOSIS — E1142 Type 2 diabetes mellitus with diabetic polyneuropathy: Secondary | ICD-10-CM | POA: Diagnosis not present

## 2024-05-23 DIAGNOSIS — M545 Low back pain, unspecified: Secondary | ICD-10-CM

## 2024-05-23 DIAGNOSIS — F330 Major depressive disorder, recurrent, mild: Secondary | ICD-10-CM

## 2024-05-23 DIAGNOSIS — Z23 Encounter for immunization: Secondary | ICD-10-CM

## 2024-05-23 DIAGNOSIS — F17210 Nicotine dependence, cigarettes, uncomplicated: Secondary | ICD-10-CM

## 2024-05-23 DIAGNOSIS — I10 Essential (primary) hypertension: Secondary | ICD-10-CM | POA: Diagnosis not present

## 2024-05-23 DIAGNOSIS — K219 Gastro-esophageal reflux disease without esophagitis: Secondary | ICD-10-CM

## 2024-05-23 DIAGNOSIS — J410 Simple chronic bronchitis: Secondary | ICD-10-CM | POA: Diagnosis not present

## 2024-05-23 LAB — POCT LIPID PANEL
HDL: 30
LDL: 83
Non-HDL: 140
TC/HDL: 2.8
TC: 170
TRG: 284

## 2024-05-23 LAB — POCT GLYCOSYLATED HEMOGLOBIN (HGB A1C): Hemoglobin A1C: 8.4 % — AB (ref 4.0–5.6)

## 2024-05-23 MED ORDER — OMEGA-3-ACID ETHYL ESTERS 1 G PO CAPS: 2.0000 g | ORAL_CAPSULE | Freq: Two times a day (BID) | ORAL | 0 refills | Status: AC

## 2024-05-23 NOTE — Assessment & Plan Note (Addendum)
  Orders:   Pfizer Comirnaty Covid-19 Vaccine 81yrs & older

## 2024-05-23 NOTE — Patient Instructions (Addendum)
  VISIT SUMMARY: You had a follow-up visit to manage your diabetes, heart disease, and other chronic conditions. We discussed your current symptoms, medication use, and lifestyle habits. You received a flu shot and COVID booster, and we scheduled an eye appointment for you.  YOUR PLAN: TYPE 2 DIABETES MELLITUS WITH DIABETIC POLYNEUROPATHY: Your diabetes management needs improvement, and your A1c level is currently 8.9. You have not been checking your blood glucose regularly due to missing supplies. - You are referred to diabetes education at [facility].  If you not receive a call to set up and appointment within the next 2 weeks, please call our office -You received samples of Ozempic . - Take Ozempic  0.25 mg weekly x 2 weeks then increase to 0.5 mg weekly until gone. - Then start Ozempic  2 mg weekly. - Increase Lantus  to 36 units daily -Make sure to obtain blood glucose monitoring supplies from the pharmacy. -You need to check your sugars once a day fasting.  Keep a log of your sugars and bring back at your next appointment. -Try to reduce soda intake and consider switching to diet sodas.  Please make your own Eveline [place] at home so you are using unsweetened tea and sugar-free lemonade yeah  MIXED HYPERLIPIDEMIA: Your triglycerides are higher than the target level, although your LDL has improved slightly. -You are prescribed prescription fish oil Lovaza 1 g 2 capsules twice daily 80 Shae needs a flu and COVID vaccines please yeah to help manage your triglycerides. -Continue taking Repatha  and Crestor .  ESSENTIAL HYPERTENSION: Your blood pressure is being managed with your current medications. -Continue taking Entresto , Toprol  XL, and isosorbide  mononitrate as prescribed.  CHRONIC BACK PAIN: You have mild chronic back pain that you manage with medication. -Continue taking Celebrex  and Percocet as needed.  CHRONIC OBSTRUCTIVE PULMONARY DISEASE (COPD): You experience shortness of breath when  walking and use inhalers to manage it. -Continue using Trelegy and albuterol  inhalers as needed.  GOUT, PROPHYLAXIS: You are managing your gout with medication. -Continue taking allopurinol  as prescribed.  GASTROESOPHAGEAL REFLUX DISEASE (GERD): You are managing your GERD with medication. -Continue taking lansoprazole  and famotidine  as prescribed.  DEPRESSION: You are managing your depression with medication and have no current symptoms. -Continue taking Zoloft  and trazodone  as prescribed.  TOBACCO USE DISORDER: You smoke about a pack a day and have considered quitting. -Consider quitting smoking. We can provide resources to help you.  GENERAL HEALTH MAINTENANCE: You are overdue for an eye appointment and lung cancer screening. You also needed a flu shot and COVID booster. -You received a flu shot and COVID booster today. -Schedule your eye appointment THIS WEEK.  - Consider lung cancer screening in the future with a CT scan.                       Contains text generated by Abridge.                                 Contains text generated by Abridge.

## 2024-05-23 NOTE — Telephone Encounter (Signed)
 Patient was identified as falling into the [place] Measure - Diabetes.   Patient was: Appointment already scheduled for:  Today, 05/23/24.   Patient's last A1C was 8.1 in August 2025.  HM Gaps include: Health Maintenance  Topic Date Due   DTaP/Tdap/Td (1 - Tdap) Never done   Zoster Vaccines- Shingrix (2 of 2) 07/02/2019   OPHTHALMOLOGY EXAM  11/12/2021   Lung Cancer Screening  02/19/2023   Influenza Vaccine  02/04/2024   COVID-19 Vaccine (6 - 2025-26 season) 03/06/2024   Medicare Annual Wellness (AWV)  05/10/2024

## 2024-05-24 ENCOUNTER — Telehealth: Payer: Self-pay | Admitting: Family Medicine

## 2024-05-24 LAB — CBC WITH DIFFERENTIAL/PLATELET
Basophils Absolute: 0.1 x10E3/uL (ref 0.0–0.2)
Basos: 0 %
EOS (ABSOLUTE): 0.1 x10E3/uL (ref 0.0–0.4)
Eos: 1 %
Hematocrit: 50 % (ref 37.5–51.0)
Hemoglobin: 16.8 g/dL (ref 13.0–17.7)
Immature Grans (Abs): 0 x10E3/uL (ref 0.0–0.1)
Immature Granulocytes: 0 %
Lymphocytes Absolute: 2.7 x10E3/uL (ref 0.7–3.1)
Lymphs: 22 %
MCH: 30.3 pg (ref 26.6–33.0)
MCHC: 33.6 g/dL (ref 31.5–35.7)
MCV: 90 fL (ref 79–97)
Monocytes Absolute: 0.8 x10E3/uL (ref 0.1–0.9)
Monocytes: 6 %
Neutrophils Absolute: 9 x10E3/uL — ABNORMAL HIGH (ref 1.4–7.0)
Neutrophils: 71 %
Platelets: 384 x10E3/uL (ref 150–450)
RBC: 5.55 x10E6/uL (ref 4.14–5.80)
RDW: 12.7 % (ref 11.6–15.4)
WBC: 12.7 x10E3/uL — ABNORMAL HIGH (ref 3.4–10.8)

## 2024-05-24 LAB — COMPREHENSIVE METABOLIC PANEL WITH GFR
ALT: 7 IU/L (ref 0–44)
AST: 8 IU/L (ref 0–40)
Albumin: 4.5 g/dL (ref 3.9–4.9)
Alkaline Phosphatase: 90 IU/L (ref 47–123)
BUN/Creatinine Ratio: 8 — ABNORMAL LOW (ref 10–24)
BUN: 10 mg/dL (ref 8–27)
Bilirubin Total: 0.6 mg/dL (ref 0.0–1.2)
CO2: 25 mmol/L (ref 20–29)
Calcium: 10.4 mg/dL — ABNORMAL HIGH (ref 8.6–10.2)
Chloride: 99 mmol/L (ref 96–106)
Creatinine, Ser: 1.23 mg/dL (ref 0.76–1.27)
Globulin, Total: 2.5 g/dL (ref 1.5–4.5)
Glucose: 161 mg/dL — ABNORMAL HIGH (ref 70–99)
Potassium: 4.7 mmol/L (ref 3.5–5.2)
Sodium: 138 mmol/L (ref 134–144)
Total Protein: 7 g/dL (ref 6.0–8.5)
eGFR: 65 mL/min/1.73 (ref >59)

## 2024-05-24 NOTE — Telephone Encounter (Unsigned)
 Copied from CRM #[id number]. Topic: Clinical - Medication Question >> May 24, 2024 12:47 PM Donee H wrote: Reason for CRM: Patient calling regarding medication Ozempic . Patient states just had a visit with Dr. Sherre on 05-23-2024 and she mentioned she could provide him with samples of the medication. Patient stated he didn't receive them. He wanted to know if they will be sent to pharmacy or if he can pick them up at office. Please follow up with patient.  [phone]

## 2024-05-25 NOTE — Telephone Encounter (Signed)
 Patient aware to come by the office to pick up sample.

## 2024-05-27 ENCOUNTER — Encounter: Payer: Self-pay | Admitting: Family Medicine

## 2024-05-27 NOTE — Assessment & Plan Note (Addendum)
 Managed with lansoprazole  and famotidine . - Continue lansoprazole  and famotidine .

## 2024-05-27 NOTE — Assessment & Plan Note (Addendum)
 Managed with Zoloft  and trazodone . Reports no symptoms of depression or anxiety. - Continue Zoloft  and trazodone .

## 2024-05-27 NOTE — Assessment & Plan Note (Deleted)
 Smokes approximately a pack a day. Has considered quitting and previously quit for a few months. - Encouraged smoking cessation.

## 2024-05-27 NOTE — Assessment & Plan Note (Addendum)
 Managed with Trelegy and albuterol . Uses albuterol  for shortness of breath when walking. - Continue Trelegy and albuterol  as needed. - Smokes approximately a pack a day. Has considered quitting and previously quit for a few months. Encouraged smoking cessation. Discussed for 5 minutes. - Consider lung cancer screening in the future with a CT scan. Patient has not kept appointments made in the past.

## 2024-05-27 NOTE — Assessment & Plan Note (Addendum)
 Managed with allopurinol . - Continue allopurinol .

## 2024-05-27 NOTE — Assessment & Plan Note (Addendum)
 Managed with Celebrex  and Percocet. - pain contract in place.

## 2024-06-07 ENCOUNTER — Encounter: Admitting: Podiatry

## 2024-06-07 NOTE — Progress Notes (Signed)
Patient did not show for his scheduled appointment today.

## 2024-06-14 ENCOUNTER — Other Ambulatory Visit: Payer: Self-pay | Admitting: Family Medicine

## 2024-06-14 DIAGNOSIS — M545 Low back pain, unspecified: Secondary | ICD-10-CM

## 2024-06-14 NOTE — Telephone Encounter (Signed)
 Copied from CRM #[id number]. Topic: Clinical - Medication Refill >> Jun 14, 2024 10:36 AM Emylou G wrote: Medication: oxyCODONE -acetaminophen  (PERCOCET/ROXICET) 5-325 MG tablet  Has the patient contacted their pharmacy? No (Agent: If no, request that the patient contact the pharmacy for the refill. If patient does not wish to contact the pharmacy document the reason why and proceed with request.) (Agent: If yes, when and what did the pharmacy advise?)  This is the patient's preferred pharmacy:  [id number] [place] - [address] [place] [address] Phone: [phone] Fax: [phone]  Is this the correct pharmacy for this prescription? Yes If no, delete pharmacy and type the correct one.   Has the prescription been filled recently? Yes  Is the patient out of the medication? Yes - almost  Has the patient been seen for an appointment in the last year OR does the patient have an upcoming appointment? Yes  Can we respond through MyChart? No  Agent: Please be advised that Rx refills may take up to 3 business days. We ask that you follow-up with your pharmacy.

## 2024-06-15 ENCOUNTER — Other Ambulatory Visit: Payer: Self-pay | Admitting: Family Medicine

## 2024-06-15 DIAGNOSIS — M545 Low back pain, unspecified: Secondary | ICD-10-CM

## 2024-06-15 MED ORDER — OXYCODONE-ACETAMINOPHEN 5-325 MG PO TABS: 1.0000 | ORAL_TABLET | Freq: Four times a day (QID) | ORAL | 0 refills | Status: DC | PRN

## 2024-06-16 ENCOUNTER — Other Ambulatory Visit: Payer: Self-pay | Admitting: Family Medicine

## 2024-06-16 DIAGNOSIS — I25118 Atherosclerotic heart disease of native coronary artery with other forms of angina pectoris: Secondary | ICD-10-CM

## 2024-06-21 ENCOUNTER — Ambulatory Visit: Admitting: Family Medicine

## 2024-07-13 ENCOUNTER — Telehealth: Payer: Self-pay | Admitting: Family Medicine

## 2024-07-13 DIAGNOSIS — M545 Low back pain, unspecified: Secondary | ICD-10-CM

## 2024-07-13 MED ORDER — OXYCODONE-ACETAMINOPHEN 5-325 MG PO TABS: 1.0000 | ORAL_TABLET | Freq: Four times a day (QID) | ORAL | 0 refills | Status: DC | PRN

## 2024-07-13 NOTE — Telephone Encounter (Signed)
 Copied from CRM #[id number]. Topic: Clinical - Medication Refill >> Jul 13, 2024  2:59 PM Rosaria E wrote: Medication: oxyCODONE -acetaminophen  (PERCOCET/ROXICET) 5-325 MG tablet  Has the patient contacted their pharmacy? Yes (Agent: If no, request that the patient contact the pharmacy for the refill. If patient does not wish to contact the pharmacy document the reason why and proceed with request.) (Agent: If yes, when and what did the pharmacy advise?)  This is the patient's preferred pharmacy:  [facility] #[id number] [facility] [place] - [address] [place] [address] Phone: [phone] Fax: [phone]  Is this the correct pharmacy for this prescription? Yes If no, delete pharmacy and type the correct one.   Has the prescription been filled recently? Yes  Is the patient out of the medication? No.   Has the patient been seen for an appointment in the last year OR does the patient have an upcoming appointment? Yes  Can we respond through MyChart? Yes  Agent: Please be advised that Rx refills may take up to 3 business days. We ask that you follow-up with your pharmacy.

## 2024-07-14 ENCOUNTER — Ambulatory Visit: Admitting: Podiatry

## 2024-07-14 DIAGNOSIS — M79675 Pain in left toe(s): Secondary | ICD-10-CM | POA: Diagnosis not present

## 2024-07-14 DIAGNOSIS — B351 Tinea unguium: Secondary | ICD-10-CM

## 2024-07-14 DIAGNOSIS — M79674 Pain in right toe(s): Secondary | ICD-10-CM

## 2024-07-14 DIAGNOSIS — E1151 Type 2 diabetes mellitus with diabetic peripheral angiopathy without gangrene: Secondary | ICD-10-CM

## 2024-07-14 NOTE — Progress Notes (Signed)
 "    Subjective:  Patient ID: Brent Ortiz, male    DOB: 01/29/1961,  MRN: [id number]  Brent Ortiz presents to clinic today for:  Chief Complaint  Patient presents with   [facility]    Unknown last A1c. Glucose is generally around 143. Denies corns/callous. Takes Plavix .    Patient notes nails are thick, discolored, elongated and painful in shoegear when trying to ambulate.    PCP is [name], Abigail, MD. last seen 05/23/2024  Past Medical History:  Diagnosis Date   Acute combined systolic and diastolic CHF, NYHA class 2 (HCC) 01/22/2020   Acute gout of left elbow 02/14/2022   Acute idiopathic gout of right wrist 12/29/2021   Alcohol  abuse    Atherosclerosis of coronary artery of native heart with stable angina pectoris, unspecified vessel or lesion type 01/23/2016   Overview:  Completely occluded circumflex artery proximal to obtuse marginal 1 basin cardiac catheter from 2017   Atherosclerosis of native coronary artery of native heart without angina pectoris 01/23/2016   Overview:  Completely occluded circumflex artery proximal to obtuse marginal 1 basin cardiac catheter from 2017   Bilateral hip pain 11/17/2021   Bladder incontinence 11/03/2022   BMI 45.0-49.9, adult (HCC) 01/13/2013   Cellulitis of right upper extremity 01/01/2022   Centrilobular emphysema (HCC) 01/16/2022   Chronic kidney disease    Chronic pain 10/19/2019   Sees pain clinic   Chronic pain syndrome 10/19/2019   Sees pain clinic     COPD (chronic obstructive pulmonary disease) (HCC)    COPD exacerbation (HCC) 10/03/2019   Coronary artery disease involving native heart 02/08/2020   Coronary atherosclerosis of native coronary artery 01/23/2016   Overview:  Completely occluded circumflex artery proximal to obtuse marginal 1 basin cardiac catheter from 2017   Depression, major, recurrent, mild 07/11/2020   Depression, major, single episode, moderate (HCC) 07/11/2020   Diabetic polyneuropathy (HCC) 12/06/2019   ED  (erectile dysfunction) 12/06/2019   Essential hypertension 01/23/2016   Essential thrombocythemia (HCC) 09/29/2011   GERD (gastroesophageal reflux disease) 11/17/2021   GERD without esophagitis    Gout 02/14/2022   HFrEF (heart failure with reduced ejection fraction) (HCC) 02/08/2020   Idiopathic chronic gout of multiple 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 Left   Leukocytosis 09/04/2011   Lumbar pain 11/17/2021   Lung nodule 10/13/2022   Mild major depression, single episode 02/14/2022   Mixed hyperlipidemia 06/18/2017   Mixed incontinence 02/24/2022   Morbid obesity (HCC) 11/21/2020   Morbid obesity with BMI of 40.0-44.9, adult (HCC) 01/13/2013   Obstructive chronic bronchitis with exacerbation (HCC) 10/03/2019   Obstructive sleep apnea 07/18/2011   Olecranon bursitis of left elbow 02/14/2022   Olecranon bursitis of left elbow 02/14/2022   Opiate abuse, continuous (HCC)    Osteoarthritis    Pain in knee joint 02/12/2012   Formatting of this note might be different from the original. Left   Personal history of stroke with residual effects 07/24/2021   Polysubstance abuse (HCC)    Poor dentition    Primary insomnia 08/22/2023   Screening for HIV (human immunodeficiency virus) 10/13/2022   Simple chronic bronchitis (HCC) 05/15/2023   Stage 3b chronic kidney disease (HCC) 12/27/2020   Thrombocytosis 09/29/2011   Tobacco dependence 01/23/2016   Tobacco use disorder 01/23/2016   Type 2 diabetes mellitus with diabetic polyneuropathy (HCC)    Uncomplicated opioid dependence (HCC) 03/30/2022  Past Surgical History:  Procedure Laterality Date   HERNIA REPAIR  2009   I & D KNEE WITH POLY EXCHANGE  09/03/2011   Procedure: IRRIGATION AND DEBRIDEMENT KNEE WITH POLY EXCHANGE;  Surgeon: Donnice JONETTA Car, MD;  Location: [facility];  Service: Orthopedics;  Laterality: Left;   TOOTH EXTRACTION Bilateral  08/06/2017   Procedure: DENTAL RESTORATION/EXTRACTIONS;  Surgeon: Sheryle Hamilton, DDS;  Location: [facility] OR;  Service: Oral Surgery;  Laterality: Bilateral;   TOTAL KNEE ARTHROPLASTY   right  feb 2012   left march 2012 r   TOTAL KNEE REVISION  06/23/2011   Procedure: TOTAL KNEE REVISION;  Surgeon: Donnice JONETTA Car;  Location: [facility];  Service: Orthopedics;  Laterality: Left;  femomal nerve block done in holding area without incident   Allergies  Allergen Reactions   Motrin  [Ibuprofen ] Other (See Comments)    Reaction: ulcers   Buprenorphine  Itching    Itching and dizziness   Other Nausea And Vomiting    Patient has ulcers    Review of Systems: Negative except as noted in the HPI.  Objective:  Brent Ortiz is a pleasant 64 y.o. male in NAD. AAO x 3.  Vascular Examination: Capillary refill time is 3-5 seconds to toes bilateral. Palpable pedal pulses b/l LE. Digital hair sparse b/l.  Skin temperature gradient WNL b/l.  +1 pitting edema in legs and ankles bilateral  Dermatological Examination: Pedal skin with increased turgor, texture and tone b/l. No open wounds. No interdigital macerations b/l. Toenails x10 are 3mm thick, discolored, dystrophic with subungual debris. There is pain with compression of the nail plates.  They are elongated x10     Latest Ref Rng & Units 05/23/2024   10:39 AM 02/18/2024   10:42 AM 11/16/2023    9:58 AM 08/18/2023   11:09 AM  Hemoglobin A1C  Hemoglobin-A1c 4.0 - 5.6 % 8.4  8.1  8.4  9.0    Assessment/Plan: 1. Pain due to onychomycosis of toenails of both feet   2. Type II diabetes mellitus with peripheral circulatory disorder (HCC)     The mycotic toenails were sharply debrided x10 with sterile nail nippers and a power debriding burr to decrease bulk/thickness and length.   Return in about 3 months (around 10/12/2024) for [facility].   Brent Ortiz, DPM, FACFAS [facility]     [address] [place], [place] [address]                Office [phone]  Fax [phone] "

## 2024-07-19 ENCOUNTER — Other Ambulatory Visit: Payer: Self-pay | Admitting: Family Medicine

## 2024-07-19 DIAGNOSIS — J432 Centrilobular emphysema: Secondary | ICD-10-CM

## 2024-08-07 ENCOUNTER — Other Ambulatory Visit (HOSPITAL_COMMUNITY): Payer: Self-pay

## 2024-08-10 ENCOUNTER — Telehealth: Payer: Self-pay | Admitting: Family Medicine

## 2024-08-10 NOTE — Telephone Encounter (Signed)
 Copied from CRM [id number]. Topic: Clinical - Medication Refill >> Aug 10, 2024  1:52 PM Olam RAMAN wrote: Medication: oxyCODONE -acetaminophen  (PERCOCET/ROXICET) 5-325 MG tablet  Has the patient contacted their pharmacy? No (Agent: If no, request that the patient contact the pharmacy for the refill. If patient does not wish to contact the pharmacy document the reason why and proceed with request.) (Agent: If yes, when and what did the pharmacy advise?)  This is the patient's preferred pharmacy:  [facility] #[id number] [facility] [place] - [address] [place] [address] Phone: [phone] Fax: [phone]  [facility] - [address] [place] - [address] [place] [address] Phone: [phone] Fax: [phone]  Is this the correct pharmacy for this prescription? Yes If no, delete pharmacy and type the correct one.   Has the prescription been filled recently? Yes  Is the patient out of the medication? Yes  Has the patient been seen for an appointment in the last year OR does the patient have an upcoming appointment? Yes  Can we respond through MyChart? No  Agent: Please be advised that Rx refills may take up to 3 business days. We ask that you follow-up with your pharmacy.

## 2024-08-11 ENCOUNTER — Other Ambulatory Visit: Payer: Self-pay

## 2024-08-11 DIAGNOSIS — M545 Low back pain, unspecified: Secondary | ICD-10-CM

## 2024-08-11 MED ORDER — OXYCODONE-ACETAMINOPHEN 5-325 MG PO TABS: 1.0000 | ORAL_TABLET | Freq: Four times a day (QID) | ORAL | 0 refills | Status: AC | PRN

## 2024-09-01 ENCOUNTER — Ambulatory Visit: Admitting: Family Medicine

## 2024-10-13 ENCOUNTER — Ambulatory Visit: Admitting: Podiatry
